# Patient Record
Sex: Female | Born: 1980 | Race: White | Hispanic: No | Marital: Single | Smoking: Former smoker
Health system: Southern US, Community
[De-identification: ages and names within clinical notes are randomized; demographics above are authoritative.]

## PROBLEM LIST (undated history)

## (undated) ENCOUNTER — Inpatient Hospital Stay (HOSPITAL_COMMUNITY): Payer: Self-pay

## (undated) DIAGNOSIS — J410 Simple chronic bronchitis: Secondary | ICD-10-CM

## (undated) DIAGNOSIS — F319 Bipolar disorder, unspecified: Secondary | ICD-10-CM

## (undated) DIAGNOSIS — J309 Allergic rhinitis, unspecified: Secondary | ICD-10-CM

## (undated) DIAGNOSIS — M546 Pain in thoracic spine: Secondary | ICD-10-CM

## (undated) DIAGNOSIS — Z9889 Other specified postprocedural states: Secondary | ICD-10-CM

## (undated) DIAGNOSIS — Z8632 Personal history of gestational diabetes: Secondary | ICD-10-CM

## (undated) DIAGNOSIS — G8929 Other chronic pain: Secondary | ICD-10-CM

## (undated) DIAGNOSIS — R112 Nausea with vomiting, unspecified: Secondary | ICD-10-CM

## (undated) DIAGNOSIS — E785 Hyperlipidemia, unspecified: Secondary | ICD-10-CM

## (undated) DIAGNOSIS — G43909 Migraine, unspecified, not intractable, without status migrainosus: Secondary | ICD-10-CM

## (undated) DIAGNOSIS — Z973 Presence of spectacles and contact lenses: Secondary | ICD-10-CM

## (undated) DIAGNOSIS — N920 Excessive and frequent menstruation with regular cycle: Secondary | ICD-10-CM

## (undated) DIAGNOSIS — F329 Major depressive disorder, single episode, unspecified: Secondary | ICD-10-CM

## (undated) DIAGNOSIS — F419 Anxiety disorder, unspecified: Secondary | ICD-10-CM

## (undated) DIAGNOSIS — M1990 Unspecified osteoarthritis, unspecified site: Secondary | ICD-10-CM

## (undated) DIAGNOSIS — K219 Gastro-esophageal reflux disease without esophagitis: Secondary | ICD-10-CM

## (undated) HISTORY — PX: TUBAL LIGATION: SHX77

## (undated) HISTORY — PX: GASTRIC BYPASS: SHX52

---
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
## 1998-02-02 ENCOUNTER — Ambulatory Visit (HOSPITAL_BASED_OUTPATIENT_CLINIC_OR_DEPARTMENT_OTHER): Admission: RE | Admit: 1998-02-02 | Discharge: 1998-02-02 | Payer: Self-pay | Admitting: Otolaryngology

## 1998-02-26 ENCOUNTER — Other Ambulatory Visit: Admission: RE | Admit: 1998-02-26 | Discharge: 1998-02-26 | Payer: Self-pay | Admitting: Obstetrics and Gynecology

## 1998-03-02 ENCOUNTER — Encounter: Payer: Self-pay | Admitting: Emergency Medicine

## 1998-03-02 ENCOUNTER — Emergency Department (HOSPITAL_COMMUNITY): Admission: EM | Admit: 1998-03-02 | Discharge: 1998-03-02 | Payer: Self-pay | Admitting: Emergency Medicine

## 1998-09-03 ENCOUNTER — Emergency Department (HOSPITAL_COMMUNITY): Admission: EM | Admit: 1998-09-03 | Discharge: 1998-09-03 | Payer: Self-pay | Admitting: Emergency Medicine

## 1998-11-24 ENCOUNTER — Emergency Department (HOSPITAL_COMMUNITY): Admission: EM | Admit: 1998-11-24 | Discharge: 1998-11-25 | Payer: Self-pay | Admitting: Emergency Medicine

## 1998-11-26 ENCOUNTER — Emergency Department (HOSPITAL_COMMUNITY): Admission: EM | Admit: 1998-11-26 | Discharge: 1998-11-26 | Payer: Self-pay | Admitting: Internal Medicine

## 1998-12-06 ENCOUNTER — Encounter: Payer: Self-pay | Admitting: Emergency Medicine

## 1998-12-06 ENCOUNTER — Emergency Department (HOSPITAL_COMMUNITY): Admission: EM | Admit: 1998-12-06 | Discharge: 1998-12-06 | Payer: Self-pay | Admitting: Emergency Medicine

## 1999-01-10 ENCOUNTER — Emergency Department (HOSPITAL_COMMUNITY): Admission: EM | Admit: 1999-01-10 | Discharge: 1999-01-10 | Payer: Self-pay | Admitting: Emergency Medicine

## 1999-01-14 HISTORY — PX: TONSILLECTOMY: SUR1361

## 1999-04-02 ENCOUNTER — Emergency Department (HOSPITAL_COMMUNITY): Admission: EM | Admit: 1999-04-02 | Discharge: 1999-04-02 | Payer: Self-pay | Admitting: Emergency Medicine

## 1999-09-11 ENCOUNTER — Emergency Department (HOSPITAL_COMMUNITY): Admission: EM | Admit: 1999-09-11 | Discharge: 1999-09-11 | Payer: Self-pay | Admitting: Emergency Medicine

## 1999-10-02 ENCOUNTER — Emergency Department (HOSPITAL_COMMUNITY): Admission: EM | Admit: 1999-10-02 | Discharge: 1999-10-03 | Payer: Self-pay

## 1999-10-13 ENCOUNTER — Ambulatory Visit (HOSPITAL_COMMUNITY): Admission: RE | Admit: 1999-10-13 | Discharge: 1999-10-13 | Payer: Self-pay | Admitting: Specialist

## 1999-10-13 ENCOUNTER — Encounter: Payer: Self-pay | Admitting: Specialist

## 1999-11-10 ENCOUNTER — Emergency Department (HOSPITAL_COMMUNITY): Admission: EM | Admit: 1999-11-10 | Discharge: 1999-11-10 | Payer: Self-pay | Admitting: Emergency Medicine

## 1999-12-25 ENCOUNTER — Ambulatory Visit (HOSPITAL_COMMUNITY): Admission: RE | Admit: 1999-12-25 | Discharge: 1999-12-25 | Payer: Self-pay | Admitting: Neurology

## 1999-12-27 ENCOUNTER — Ambulatory Visit (HOSPITAL_COMMUNITY): Admission: RE | Admit: 1999-12-27 | Discharge: 1999-12-27 | Payer: Self-pay | Admitting: Neurology

## 1999-12-27 ENCOUNTER — Encounter: Payer: Self-pay | Admitting: Neurology

## 2001-03-12 ENCOUNTER — Encounter: Payer: Self-pay | Admitting: Emergency Medicine

## 2001-03-12 ENCOUNTER — Emergency Department (HOSPITAL_COMMUNITY): Admission: EM | Admit: 2001-03-12 | Discharge: 2001-03-12 | Payer: Self-pay | Admitting: Emergency Medicine

## 2001-05-11 ENCOUNTER — Emergency Department (HOSPITAL_COMMUNITY): Admission: EM | Admit: 2001-05-11 | Discharge: 2001-05-11 | Payer: Self-pay | Admitting: Emergency Medicine

## 2001-05-11 ENCOUNTER — Encounter: Payer: Self-pay | Admitting: Emergency Medicine

## 2001-08-03 ENCOUNTER — Emergency Department (HOSPITAL_COMMUNITY): Admission: EM | Admit: 2001-08-03 | Discharge: 2001-08-03 | Payer: Self-pay | Admitting: Emergency Medicine

## 2002-02-02 ENCOUNTER — Emergency Department (HOSPITAL_COMMUNITY): Admission: EM | Admit: 2002-02-02 | Discharge: 2002-02-02 | Payer: Self-pay | Admitting: Emergency Medicine

## 2002-03-07 ENCOUNTER — Other Ambulatory Visit: Admission: RE | Admit: 2002-03-07 | Discharge: 2002-03-07 | Payer: Self-pay | Admitting: Internal Medicine

## 2004-04-16 ENCOUNTER — Ambulatory Visit: Payer: Self-pay | Admitting: *Deleted

## 2004-04-23 ENCOUNTER — Ambulatory Visit: Payer: Self-pay | Admitting: *Deleted

## 2004-04-26 ENCOUNTER — Inpatient Hospital Stay (HOSPITAL_COMMUNITY): Admission: AD | Admit: 2004-04-26 | Discharge: 2004-04-26 | Payer: Self-pay | Admitting: Obstetrics & Gynecology

## 2004-04-30 ENCOUNTER — Ambulatory Visit: Payer: Self-pay | Admitting: *Deleted

## 2004-05-06 ENCOUNTER — Inpatient Hospital Stay (HOSPITAL_COMMUNITY): Admission: AD | Admit: 2004-05-06 | Discharge: 2004-05-06 | Payer: Self-pay | Admitting: Obstetrics & Gynecology

## 2004-05-07 ENCOUNTER — Ambulatory Visit: Payer: Self-pay | Admitting: *Deleted

## 2004-05-10 ENCOUNTER — Inpatient Hospital Stay (HOSPITAL_COMMUNITY): Admission: AD | Admit: 2004-05-10 | Discharge: 2004-05-10 | Payer: Self-pay | Admitting: *Deleted

## 2004-05-14 ENCOUNTER — Ambulatory Visit: Payer: Self-pay | Admitting: *Deleted

## 2004-05-17 ENCOUNTER — Ambulatory Visit: Payer: Self-pay | Admitting: *Deleted

## 2004-05-21 ENCOUNTER — Inpatient Hospital Stay (HOSPITAL_COMMUNITY): Admission: AD | Admit: 2004-05-21 | Discharge: 2004-05-26 | Payer: Self-pay | Admitting: Obstetrics and Gynecology

## 2004-05-21 ENCOUNTER — Ambulatory Visit: Payer: Self-pay | Admitting: Obstetrics & Gynecology

## 2004-05-21 ENCOUNTER — Ambulatory Visit: Payer: Self-pay | Admitting: *Deleted

## 2004-05-23 ENCOUNTER — Encounter (INDEPENDENT_AMBULATORY_CARE_PROVIDER_SITE_OTHER): Payer: Self-pay | Admitting: *Deleted

## 2004-05-29 ENCOUNTER — Inpatient Hospital Stay (HOSPITAL_COMMUNITY): Admission: AD | Admit: 2004-05-29 | Discharge: 2004-05-29 | Payer: Self-pay | Admitting: Obstetrics and Gynecology

## 2004-06-04 ENCOUNTER — Inpatient Hospital Stay (HOSPITAL_COMMUNITY): Admission: AD | Admit: 2004-06-04 | Discharge: 2004-06-04 | Payer: Self-pay | Admitting: *Deleted

## 2004-10-10 ENCOUNTER — Emergency Department (HOSPITAL_COMMUNITY): Admission: EM | Admit: 2004-10-10 | Discharge: 2004-10-10 | Payer: Self-pay | Admitting: Emergency Medicine

## 2004-10-23 ENCOUNTER — Inpatient Hospital Stay (HOSPITAL_COMMUNITY): Admission: AD | Admit: 2004-10-23 | Discharge: 2004-10-23 | Payer: Self-pay | Admitting: Obstetrics and Gynecology

## 2004-10-31 ENCOUNTER — Ambulatory Visit: Payer: Self-pay | Admitting: Family Medicine

## 2004-10-31 ENCOUNTER — Encounter: Payer: Self-pay | Admitting: Family Medicine

## 2004-12-18 ENCOUNTER — Emergency Department (HOSPITAL_COMMUNITY): Admission: EM | Admit: 2004-12-18 | Discharge: 2004-12-18 | Payer: Self-pay | Admitting: Emergency Medicine

## 2004-12-21 ENCOUNTER — Emergency Department (HOSPITAL_COMMUNITY): Admission: EM | Admit: 2004-12-21 | Discharge: 2004-12-21 | Payer: Self-pay | Admitting: Emergency Medicine

## 2005-05-14 ENCOUNTER — Emergency Department (HOSPITAL_COMMUNITY): Admission: EM | Admit: 2005-05-14 | Discharge: 2005-05-14 | Payer: Self-pay | Admitting: Emergency Medicine

## 2005-09-11 ENCOUNTER — Emergency Department (HOSPITAL_COMMUNITY): Admission: EM | Admit: 2005-09-11 | Discharge: 2005-09-11 | Payer: Self-pay | Admitting: Emergency Medicine

## 2006-09-12 ENCOUNTER — Emergency Department (HOSPITAL_COMMUNITY): Admission: EM | Admit: 2006-09-12 | Discharge: 2006-09-12 | Payer: Self-pay | Admitting: Emergency Medicine

## 2006-10-10 ENCOUNTER — Emergency Department (HOSPITAL_COMMUNITY): Admission: EM | Admit: 2006-10-10 | Discharge: 2006-10-10 | Payer: Self-pay | Admitting: Emergency Medicine

## 2007-01-08 ENCOUNTER — Encounter: Admission: RE | Admit: 2007-01-08 | Discharge: 2007-01-08 | Payer: Self-pay | Admitting: Specialist

## 2007-02-05 ENCOUNTER — Emergency Department (HOSPITAL_COMMUNITY): Admission: EM | Admit: 2007-02-05 | Discharge: 2007-02-05 | Payer: Self-pay | Admitting: Family Medicine

## 2007-02-17 ENCOUNTER — Encounter: Admission: RE | Admit: 2007-02-17 | Discharge: 2007-02-17 | Payer: Self-pay | Admitting: Specialist

## 2007-03-25 ENCOUNTER — Emergency Department (HOSPITAL_COMMUNITY): Admission: EM | Admit: 2007-03-25 | Discharge: 2007-03-25 | Payer: Self-pay | Admitting: Emergency Medicine

## 2007-08-03 ENCOUNTER — Emergency Department (HOSPITAL_COMMUNITY): Admission: EM | Admit: 2007-08-03 | Discharge: 2007-08-03 | Payer: Self-pay | Admitting: Family Medicine

## 2007-09-24 ENCOUNTER — Emergency Department (HOSPITAL_COMMUNITY): Admission: EM | Admit: 2007-09-24 | Discharge: 2007-09-24 | Payer: Self-pay | Admitting: Emergency Medicine

## 2007-10-14 LAB — CONVERTED CEMR LAB: Pap Smear: NORMAL

## 2007-11-17 ENCOUNTER — Ambulatory Visit (HOSPITAL_COMMUNITY): Admission: RE | Admit: 2007-11-17 | Discharge: 2007-11-18 | Payer: Self-pay | Admitting: Specialist

## 2007-11-17 HISTORY — PX: LUMBAR MICRODISCECTOMY: SHX99

## 2008-01-23 ENCOUNTER — Emergency Department (HOSPITAL_COMMUNITY): Admission: EM | Admit: 2008-01-23 | Discharge: 2008-01-23 | Payer: Self-pay | Admitting: Emergency Medicine

## 2008-10-13 ENCOUNTER — Emergency Department (HOSPITAL_COMMUNITY): Admission: EM | Admit: 2008-10-13 | Discharge: 2008-10-13 | Payer: Self-pay | Admitting: Emergency Medicine

## 2009-02-01 ENCOUNTER — Ambulatory Visit: Payer: Self-pay | Admitting: Internal Medicine

## 2009-02-01 DIAGNOSIS — G43009 Migraine without aura, not intractable, without status migrainosus: Secondary | ICD-10-CM | POA: Insufficient documentation

## 2009-02-01 DIAGNOSIS — F329 Major depressive disorder, single episode, unspecified: Secondary | ICD-10-CM

## 2009-02-01 DIAGNOSIS — K219 Gastro-esophageal reflux disease without esophagitis: Secondary | ICD-10-CM

## 2009-02-01 DIAGNOSIS — F411 Generalized anxiety disorder: Secondary | ICD-10-CM | POA: Insufficient documentation

## 2009-02-01 DIAGNOSIS — M549 Dorsalgia, unspecified: Secondary | ICD-10-CM | POA: Insufficient documentation

## 2009-02-01 DIAGNOSIS — K8020 Calculus of gallbladder without cholecystitis without obstruction: Secondary | ICD-10-CM | POA: Insufficient documentation

## 2009-02-01 LAB — CONVERTED CEMR LAB
ALT: 16 units/L (ref 0–35)
AST: 18 units/L (ref 0–37)
Alkaline Phosphatase: 58 units/L (ref 39–117)
BUN: 9 mg/dL (ref 6–23)
Basophils Absolute: 0.1 10*3/uL (ref 0.0–0.1)
Bilirubin Urine: NEGATIVE
CO2: 30 meq/L (ref 19–32)
Chloride: 104 meq/L (ref 96–112)
Cholesterol: 200 mg/dL (ref 0–200)
Eosinophils Relative: 1.5 % (ref 0.0–5.0)
Glucose, Bld: 97 mg/dL (ref 70–99)
HCT: 42.9 % (ref 36.0–46.0)
Ketones, ur: NEGATIVE mg/dL
Lymphs Abs: 2 10*3/uL (ref 0.7–4.0)
MCHC: 32 g/dL (ref 30.0–36.0)
Monocytes Absolute: 0.5 10*3/uL (ref 0.1–1.0)
Monocytes Relative: 6.5 % (ref 3.0–12.0)
Nitrite: NEGATIVE
Platelets: 248 10*3/uL (ref 150.0–400.0)
Sodium: 140 meq/L (ref 135–145)
Specific Gravity, Urine: 1.015 (ref 1.000–1.030)
TSH: 0.7 microintl units/mL (ref 0.35–5.50)
Total Protein, Urine: NEGATIVE mg/dL
Triglycerides: 215 mg/dL — ABNORMAL HIGH (ref 0.0–149.0)
WBC: 7.2 10*3/uL (ref 4.5–10.5)
pH: 6.5 (ref 5.0–8.0)

## 2009-04-26 ENCOUNTER — Ambulatory Visit: Payer: Self-pay | Admitting: Internal Medicine

## 2009-04-26 DIAGNOSIS — F988 Other specified behavioral and emotional disorders with onset usually occurring in childhood and adolescence: Secondary | ICD-10-CM | POA: Insufficient documentation

## 2009-04-26 DIAGNOSIS — E785 Hyperlipidemia, unspecified: Secondary | ICD-10-CM

## 2009-04-26 DIAGNOSIS — L290 Pruritus ani: Secondary | ICD-10-CM | POA: Insufficient documentation

## 2009-04-26 HISTORY — DX: Pruritus ani: L29.0

## 2009-04-30 ENCOUNTER — Telehealth: Payer: Self-pay | Admitting: Internal Medicine

## 2009-05-03 ENCOUNTER — Encounter: Payer: Self-pay | Admitting: Internal Medicine

## 2009-06-07 ENCOUNTER — Telehealth: Payer: Self-pay | Admitting: Internal Medicine

## 2009-07-04 ENCOUNTER — Ambulatory Visit: Payer: Self-pay | Admitting: Internal Medicine

## 2009-07-04 ENCOUNTER — Encounter (INDEPENDENT_AMBULATORY_CARE_PROVIDER_SITE_OTHER): Payer: Self-pay | Admitting: *Deleted

## 2009-07-10 ENCOUNTER — Telehealth: Payer: Self-pay | Admitting: Internal Medicine

## 2009-07-10 ENCOUNTER — Encounter (INDEPENDENT_AMBULATORY_CARE_PROVIDER_SITE_OTHER): Payer: Self-pay | Admitting: *Deleted

## 2009-07-12 ENCOUNTER — Telehealth: Payer: Self-pay | Admitting: Internal Medicine

## 2009-07-30 ENCOUNTER — Telehealth: Payer: Self-pay | Admitting: Internal Medicine

## 2009-08-09 ENCOUNTER — Telehealth: Payer: Self-pay | Admitting: Internal Medicine

## 2009-09-17 ENCOUNTER — Emergency Department (HOSPITAL_COMMUNITY): Admission: EM | Admit: 2009-09-17 | Discharge: 2009-09-17 | Payer: Self-pay | Admitting: Emergency Medicine

## 2009-09-19 ENCOUNTER — Telehealth: Payer: Self-pay | Admitting: Internal Medicine

## 2009-09-20 ENCOUNTER — Ambulatory Visit: Payer: Self-pay | Admitting: Internal Medicine

## 2009-09-20 DIAGNOSIS — IMO0002 Reserved for concepts with insufficient information to code with codable children: Secondary | ICD-10-CM | POA: Insufficient documentation

## 2009-10-23 ENCOUNTER — Telehealth: Payer: Self-pay | Admitting: Internal Medicine

## 2009-10-31 ENCOUNTER — Encounter: Payer: Self-pay | Admitting: Internal Medicine

## 2009-11-30 ENCOUNTER — Telehealth: Payer: Self-pay | Admitting: Internal Medicine

## 2009-12-17 ENCOUNTER — Telehealth: Payer: Self-pay | Admitting: Internal Medicine

## 2010-01-11 ENCOUNTER — Telehealth: Payer: Self-pay | Admitting: Internal Medicine

## 2010-02-08 ENCOUNTER — Other Ambulatory Visit: Payer: Self-pay | Admitting: Internal Medicine

## 2010-02-08 ENCOUNTER — Ambulatory Visit
Admission: RE | Admit: 2010-02-08 | Discharge: 2010-02-08 | Payer: Self-pay | Source: Home / Self Care | Attending: Internal Medicine | Admitting: Internal Medicine

## 2010-02-08 DIAGNOSIS — G4710 Hypersomnia, unspecified: Secondary | ICD-10-CM | POA: Insufficient documentation

## 2010-02-08 LAB — BASIC METABOLIC PANEL
Calcium: 9.2 mg/dL (ref 8.4–10.5)
Chloride: 106 mEq/L (ref 96–112)
Creatinine, Ser: 0.6 mg/dL (ref 0.4–1.2)
GFR: 114.69 mL/min (ref >60.00)

## 2010-02-08 LAB — URINALYSIS, ROUTINE W REFLEX MICROSCOPIC
Specific Gravity, Urine: <1.005 (ref 1.000–1.030)
Total Protein, Urine: NEGATIVE
Urine Glucose: NEGATIVE

## 2010-02-08 LAB — URINALYSIS

## 2010-02-08 LAB — LIPID PANEL
HDL: 46.8 mg/dL (ref >39.00)
Total CHOL/HDL Ratio: 5
VLDL: 43.6 mg/dL — ABNORMAL HIGH (ref 0.0–40.0)

## 2010-02-08 LAB — CBC WITH DIFFERENTIAL/PLATELET
Basophils Absolute: 0.1 10*3/uL (ref 0.0–0.1)
Eosinophils Absolute: 0.1 10*3/uL (ref 0.0–0.7)
Eosinophils Relative: 0.9 % (ref 0.0–5.0)
Lymphocytes Relative: 22.8 % (ref 12.0–46.0)
Lymphs Abs: 2.8 10*3/uL (ref 0.7–4.0)
MCHC: 33.2 g/dL (ref 30.0–36.0)
MCV: 81.2 fl (ref 78.0–100.0)
Neutrophils Relative %: 70 % (ref 43.0–77.0)
RBC: 4.89 Mil/uL (ref 3.87–5.11)

## 2010-02-08 LAB — LDL CHOLESTEROL, DIRECT: Direct LDL: 149.6 mg/dL

## 2010-02-08 LAB — HEPATIC FUNCTION PANEL
Alkaline Phosphatase: 50 U/L (ref 39–117)
Bilirubin, Direct: 0 mg/dL (ref 0.0–0.3)
Total Bilirubin: 0.3 mg/dL (ref 0.3–1.2)

## 2010-02-08 LAB — TSH: TSH: 0.96 u[IU]/mL (ref 0.35–5.50)

## 2010-02-12 NOTE — Progress Notes (Signed)
Summary: Adderall  Phone Note Call from Patient Call back at Home Phone [phone]   Caller: Patient Summary of Call: Pt called requesting refill of Adderall. Initial call taken by: Margaret Pyle, CMA,  October 23, 2009 10:26 AM  Follow-up for Phone Call        done hardcopy to LIM side B - dahlia   Follow-up by: Corwin Levins MD,  October 23, 2009 1:13 PM  Additional Follow-up for Phone Call Additional follow up Details #1::        Pt informed, Rx in cabinet for pt pick up Additional Follow-up by: Margaret Pyle, CMA,  October 23, 2009 1:38 PM    New/Updated Medications: AMPHETAMINE-DEXTROAMPHETAMINE 20 MG TABS (AMPHETAMINE-DEXTROAMPHETAMINE) 1 by mouth once daily  - to fill Oct 23, 2009 Prescriptions: AMPHETAMINE-DEXTROAMPHETAMINE 20 MG TABS (AMPHETAMINE-DEXTROAMPHETAMINE) 1 by mouth once daily  - to fill Oct 23, 2009  #30 x 0   Entered and Authorized by:   Corwin Levins MD   Signed by:   Corwin Levins MD on 10/23/2009   Method used:   Print then Give to Patient   RxID:   [id number]

## 2010-02-12 NOTE — Progress Notes (Signed)
Summary: Adderall  Phone Note Call from Patient Call back at Home Phone [phone]   Caller: Patient Summary of Call: pt called requesting Rx refill of Adderall. Pt states that Adderall XR 20mg  is not helping and she would like to try Adderall 20mg  Initial call taken by: Margaret Pyle, CMA,  Jun 07, 2009 1:35 PM  Follow-up for Phone Call        ok to change - done hardcopy to LIM side B - dahlia  Follow-up by: Corwin Levins MD,  Jun 07, 2009 5:19 PM  Additional Follow-up for Phone Call Additional follow up Details #1::        pt informed via VM, Rx in cabinet for pt pick up Additional Follow-up by: Margaret Pyle, CMA,  Jun 08, 2009 8:13 AM    New/Updated Medications: AMPHETAMINE-DEXTROAMPHETAMINE 20 MG TABS (AMPHETAMINE-DEXTROAMPHETAMINE) 1 by mouth once daily  - to fill Jun 06, 2009 Prescriptions: AMPHETAMINE-DEXTROAMPHETAMINE 20 MG TABS (AMPHETAMINE-DEXTROAMPHETAMINE) 1 by mouth once daily  - to fill Jun 06, 2009  #30 x 0   Entered and Authorized by:   Corwin Levins MD   Signed by:   Corwin Levins MD on 06/07/2009   Method used:   Print then Give to Patient   RxID:   [id number]  done hardcopy to LIM side B - dahlia  Corwin Levins MD  Jun 07, 2009 5:20 PM

## 2010-02-12 NOTE — Assessment & Plan Note (Signed)
Summary: DISCUSS MEDICATION/ PROBLEMS/NWS   Vital Signs:  Patient profile:   30 year old female Height:      67.5 inches Weight:      266 pounds BMI:     41.19 O2 Sat:      98 % on Room air Temp:     97 degrees F oral Pulse rate:   96 / minute BP sitting:   100 / 68  (left arm) Cuff size:   large  Vitals Entered ByZella Ball Ewing (April 26, 2009 3:28 PM)  O2 Flow:  Room air CC: rectal area itching for one month, problems focusing needs prescription/RE   CC:  rectal area itching for one month and problems focusing needs prescription/RE.  History of Present Illness: here with marked itch and mild erythema to perianal area and natal cleft, with mild discomfort and no bleeding or hemorrhoid or fever for 4 wks  also c/o hx of ADD in High Sschool, tx with adderal adn did well at the time, now at [facility] And very difficult to complete her coursework due to lack of focus and concentration, reqeusts re-start of Adderall, tolerated well in the past   lost 10 lbs with better excericse and diet since jan 2011; has been trying to follow lower chol diet consistently as well  Problems Prior to Update: 1)  Add  (ICD-314.00) 2)  Pruritus Ani  (ICD-698.0) 3)  Hyperlipidemia  (ICD-272.4) 4)  Preventive Health Care  (ICD-V70.0) 5)  Gerd  (ICD-530.81) 6)  Cholelithiasis  (ICD-574.20) 7)  Anxiety  (ICD-300.00) 8)  Common Migraine  (ICD-346.10) 9)  Back Pain, Chronic  (ICD-724.5) 10)  Depression  (ICD-311)  Medications Prior to Update: 1)  Omeprazole 20 Mg Cpdr (Omeprazole) .Marland Kitchen.. 1 By Mouth Once Daily 2)  Sumatriptan Succinate 100 Mg Tabs (Sumatriptan Succinate) .Marland Kitchen.. 1 By Mouth Once Daily As Needed 3)  Fluoxetine Hcl 20 Mg Caps (Fluoxetine Hcl) .Marland Kitchen.. 1 By Mouth Once Daily 4)  Alprazolam 0.5 Mg Tabs (Alprazolam) .Marland Kitchen.. 1po Two Times A Day As Needed  Current Medications (verified): 1)  Omeprazole 20 Mg Cpdr (Omeprazole) .Marland Kitchen.. 1 By Mouth Once Daily 2)  Sumatriptan Succinate 100 Mg Tabs (Sumatriptan  Succinate) .Marland Kitchen.. 1 By Mouth Once Daily As Needed 3)  Fluoxetine Hcl 20 Mg Caps (Fluoxetine Hcl) .Marland Kitchen.. 1 By Mouth Once Daily 4)  Alprazolam 0.5 Mg Tabs (Alprazolam) .Marland Kitchen.. 1po Two Times A Day As Needed 5)  Proctofoam Hc 1-1 % Foam (Hydrocortisone Ace-Pramoxine) .... Use Asd Two Times A Day As Needed 6)  Adderall Xr 20 Mg Xr24h-Cap (Amphetamine-Dextroamphetamine) .Marland Kitchen.. 1 By Mouth Once Daily  Allergies (verified): No Known Drug Allergies  Past History:  Social History: Last updated: 04/26/2009 Single 1 child., also raises 15yo nephew Current Smoker Alcohol use-no Drug use-no work  - Environmental manager part time student - [employer]  Risk Factors: Smoking Status: current (02/01/2009)  Past Medical History: Depression obesity chronic LBP - dr Ethelene Hal and dr beane/[place] [facility] migraine Anxiety known gallstones GERD Hyperlipidemia ADD  Past Surgical History: Reviewed history from 02/01/2009 and no changes required. 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   Head:  normocephalic and atraumatic.  Eyes:  vision grossly intact, pupils equal, and pupils round.   Ears:  R ear normal and L ear normal.   Nose:  no external deformity and no nasal discharge.   Mouth:  no gingival abnormalities and pharynx pink and moist.   Neck:  supple and no masses.   Lungs:  normal respiratory effort and normal breath sounds.   Heart:  normal rate and regular rhythm.   Rectal:  deferred Extremities:  no edema, no erythema    Impression & Recommendations:  Problem # 1:  PRURITUS ANI (ICD-698.0) tx with topical steroid as needed   Problem # 2:  HYPERLIPIDEMIA (ICD-272.4)  d/w pt  - to  cont to follow lower chol diet , declines further lab today  Labs Reviewed: SGOT: 18 (02/01/2009)   SGPT: 16 (02/01/2009)   HDL:36.70 (02/01/2009)  Chol:200 (02/01/2009)  Trig:215.0 (02/01/2009)  Problem # 3:  ADD (ICD-314.00) to re-start the adderal xr 20 mg daily  Complete Medication List: 1)  Omeprazole 20 Mg Cpdr (Omeprazole) .Marland Kitchen.. 1 by mouth once daily 2)  Sumatriptan Succinate 100 Mg Tabs (Sumatriptan succinate) .Marland Kitchen.. 1 by mouth once daily as needed 3)  Fluoxetine Hcl 20 Mg Caps (Fluoxetine hcl) .Marland Kitchen.. 1 by mouth once daily 4)  Alprazolam 0.5 Mg Tabs (Alprazolam) .Marland Kitchen.. 1po two times a day as needed 5)  Proctofoam Hc 1-1 % Foam (Hydrocortisone ace-pramoxine) .... Use asd two times a day as needed 6)  Adderall Xr 20 Mg Xr24h-cap (Amphetamine-dextroamphetamine) .Marland Kitchen.. 1 by mouth once daily  Patient Instructions: 1)  Please take all new medications as prescribed 2)  Continue all previous medications as before this visit  3)  Please schedule a follow-up appointment in Jan 2012 with CPX labs  Prescriptions: PROCTOFOAM HC 1-1 % FOAM (HYDROCORTISONE ACE-PRAMOXINE) use asd two times a day as needed  #1 x 1   Entered and Authorized by:   Corwin Levins MD   Signed by:   Corwin Levins MD on 04/26/2009   Method used:   Print then Give to Patient   RxID:   [id number] ADDERALL XR 20 MG XR24H-CAP (AMPHETAMINE-DEXTROAMPHETAMINE) 1 by mouth once daily  #30 x 0   Entered and Authorized by:   Corwin Levins MD   Signed by:   Corwin Levins MD on 04/26/2009   Method used:   Print then Give to Patient   RxID:   [id number] PROCTOFOAM HC 1-1 % FOAM (HYDROCORTISONE ACE-PRAMOXINE) use asd two times a day as needed  #1 x 1   Entered and Authorized by:   Corwin Levins MD   Signed by:   Corwin Levins MD on 04/26/2009   Method used:   Electronically to        [facility] (retail)       [address]       [place], [place]  [address]       Ph: [phone]       Fax:  [phone]   RxID:   [id number]

## 2010-02-12 NOTE — Letter (Signed)
Summary: Primary Care Appointment Letter  [facility]  [address] [place], [place] [address]   Phone: [phone]  Fax: [phone]    10/31/2009 MRN: [id number]  Tina [name] [address] [place], [place]  [address]  Dear Ms. [facility],   Your Primary Care Physician Corwin Levins MD has indicated that:    ___X____it is time to schedule an appointment with Dr. Jonny Ruiz in March 2012.  Fasting Labs are in the computer to be done prior to the appt.  Please call the office.    _______you missed your appointment on______ and need to call and          reschedule.    _______you need to have lab work done.    _______you need to schedule an appointment discuss lab or test results.    _______you need to call to reschedule your appointment that is                       scheduled on _________.     Please call our office as soon as possible. Our phone number is [phone]. Please press option 1. Our office is open 8a-12noon and 1p-5p, Monday through Friday.     Thank you,    [facility]
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
## 2010-02-12 NOTE — Medication Information (Signed)
Summary: Approved/medco  Approved/medco   Imported By: Lester Versailles 05/11/2009 10:35:13  _____________________________________________________________________  External Attachment:    Type:   Image     Comment:   External Document

## 2010-02-12 NOTE — Letter (Signed)
Summary: Out of Work  [facility]  [address] [place], [place] [address]   Phone: [phone]  Fax: [phone]    July 04, 2009   Employee:  Tina [name] [employer]    To Whom It May Concern:   For Medical reasons, please excuse the above named employee from work for the following dates:  Start:   07/02/2009  End:   07/09/2009  Return to work July 09, 2009  If you need additional information, please feel free to contact our office.         Sincerely,    Dr. Oliver Barre

## 2010-02-12 NOTE — Letter (Signed)
Summary: Generic Letter  [facility]  [address] [place], [place] [address]   Phone: [phone]  Fax: [phone]    07/10/2009   Tina [name] [address] [place], [place]  [address]   [name],    To whom it may concern:  This letter to serve as clearance for the above-named patient to return to work effective June 28th, 2011 without any restriction.          Sincerely,    Margaret Pyle, CMA

## 2010-02-12 NOTE — Progress Notes (Signed)
Summary: Adderall  Phone Note Call from Patient Call back at Home Phone [phone]   Caller: Patient Summary of Call: Pt called requesting refill of Adderall Initial call taken by: Margaret Pyle, CMA,  September 19, 2009 10:03 AM  Follow-up for Phone Call        done hardcopy to LIM side B - dahlia  Follow-up by: Corwin Levins MD,  September 19, 2009 12:42 PM  Additional Follow-up for Phone Call Additional follow up Details #1::        Pt informed via VM, Rx in cabinet for pt pick up Additional Follow-up by: Margaret Pyle, CMA,  September 19, 2009 1:37 PM    New/Updated Medications: AMPHETAMINE-DEXTROAMPHETAMINE 20 MG TABS (AMPHETAMINE-DEXTROAMPHETAMINE) 1 by mouth once daily  - to fill sept 7, 2011 Prescriptions: AMPHETAMINE-DEXTROAMPHETAMINE 20 MG TABS (AMPHETAMINE-DEXTROAMPHETAMINE) 1 by mouth once daily  - to fill sept 7, 2011  #30 x 0   Entered and Authorized by:   Corwin Levins MD   Signed by:   Corwin Levins MD on 09/19/2009   Method used:   Print then Give to Patient   RxID:   [id number]

## 2010-02-12 NOTE — Progress Notes (Signed)
Summary: Adderall  Phone Note Call from Patient Call back at Home Phone [phone]   Caller: Patient Summary of Call: Pt called requesting refill of Adderall, I called pt who advised she would rather try 30mg  XR first and if it is too strong and alternate medicine can be used.  Initial call taken by: Margaret Pyle, CMA,  November 30, 2009 10:26 AM  Follow-up for Phone Call        ok - will do - done hardcopy to LIM side B - dahlia  Follow-up by: Corwin Levins MD,  November 30, 2009 1:34 PM  Additional Follow-up for Phone Call Additional follow up Details #1::        Called pt no ansew LMOM rx ready for pick-up Additional Follow-up by: Orlan Leavens RMA,  November 30, 2009 1:44 PM    New/Updated Medications: ADDERALL XR 30 MG XR24H-CAP (AMPHETAMINE-DEXTROAMPHETAMINE) 1po once daily Prescriptions: ADDERALL XR 30 MG XR24H-CAP (AMPHETAMINE-DEXTROAMPHETAMINE) 1po once daily  #30 x 0   Entered and Authorized by:   Corwin Levins MD   Signed by:   Corwin Levins MD on 11/30/2009   Method used:   Print then Give to Patient   RxID:   [id number]

## 2010-02-12 NOTE — Assessment & Plan Note (Signed)
Summary: NEW BCBS PT--PKG/OFF-#--STC   Vital Signs:  Patient profile:   30 year old female Height:      68 inches Weight:      271 pounds BMI:     41.35 O2 Sat:      98 % on Room air Temp:     98.3 degrees F oral Pulse rate:   80 / minute BP sitting:   100 / 68  (left arm) Cuff size:   large  Vitals Entered ByMarland Kitchen Zella Ball Ewing (February 01, 2009 1:01 PM)  O2 Flow:  Room air  Preventive Care Screening  Pap Smear:    Date:  10/14/2007    Results:  normal   CC: New Pt. New BCBS/RE   CC:  New Pt. New BCBS/RE.  History of Present Illness: here to establish, c/p worsening deprsision in the past month, no suicidal ideation; marked stress,  no recent panic attacks but had several about 6 months ago;  had previous tx 14 yrs for depresion after sister died but does not remember med name - did seem to help at that time, tx for approx one year;  no prior hosns for psych;  no recent counseling and feels like it would help but not sure about the cost;  has  migraine approx 5 days per wk - so far not missed work but still works for [employer];  Pt denies CP, sob, doe, wheezing, orthopnea, pnd, worsening LE edema, palps, dizziness or syncope Pt denies new neuro symptoms such as headache, facial or extremity weakness  No recent fever or other complaints.  Wt has gone up though in the past yr about 25 lbs, but no hypersomnlence.    Preventive Screening-Counseling & Management  Alcohol-Tobacco     Smoking Status: current      Drug Use:  no.    Problems Prior to Update: 1)  Preventive Health Care  (ICD-V70.0) 2)  Gerd  (ICD-530.81) 3)  Cholelithiasis  (ICD-574.20) 4)  Anxiety  (ICD-300.00) 5)  Common Migraine  (ICD-346.10) 6)  Back Pain, Chronic  (ICD-724.5) 7)  Depression  (ICD-311)  Medications Prior to Update: 1)  None  Current Medications (verified): 1)  Omeprazole 20 Mg Cpdr (Omeprazole) .Marland Kitchen.. 1 By Mouth Once Daily 2)  Sumatriptan Succinate 100 Mg Tabs (Sumatriptan Succinate) .Marland Kitchen.. 1 By  Mouth Once Daily As Needed 3)  Fluoxetine Hcl 20 Mg Caps (Fluoxetine Hcl) .Marland Kitchen.. 1 By Mouth Once Daily 4)  Alprazolam 0.5 Mg Tabs (Alprazolam) .Marland Kitchen.. 1po Two Times A Day As Needed  Allergies (verified): No Known Drug Allergies  Past History:  Family History: Last updated: 02/01/2009 m-grandmother - CABG, DM, HTN, dialysis p-grandmother with breast cancer father with DM, HTN mother with depression cousin died with cancer uncle and grandfather with colon cancer uncle with renal failure/dialysis sister died with homicide 62  Social History: Last updated: 02/01/2009 Single 1 child., also raises 32yo nephew Current Smoker Alcohol use-no Drug use-no work  - [employer]  Risk Factors: Smoking Status: current (02/01/2009)  Past Medical History: Depression obesity chronic LBP - dr Ethelene Hal and dr beane/[place] [facility] migraine Anxiety known gallstones GERD  Past Surgical History: Caesarean section Tonsillectomy s/p disc surgury/spinal stenosis - lumbar - Nov 2009 - dr Roosevelt Locks  Family History: Reviewed history and no changes required. m-grandmother - CABG, DM, HTN, dialysis p-grandmother with breast cancer father with DM, HTN mother with depression cousin died with cancer uncle and grandfather with colon cancer uncle with renal failure/dialysis sister died with homicide 48  Social History: Reviewed history and no changes required. Single 1 child., also raises 32yo nephew Current Smoker Alcohol use-no Drug use-no work  - [employer] Status:  current Drug Use:  no  Review of Systems  The patient denies anorexia, fever, vision loss, decreased hearing, hoarseness, chest pain, syncope, peripheral edema, prolonged cough, headaches, hemoptysis, abdominal pain, melena, hematochezia, severe indigestion/heartburn, hematuria, incontinence, muscle weakness, suspicious skin lesions, transient blindness, difficulty walking, abnormal bleeding, enlarged lymph nodes, and  angioedema.         all otherwise negative per pt - 12 system review done  - except for rare sharp stabbing pains to abd in different places; has some acid reflux'; without dysphagia, or pain  Physical Exam  General:  alert and overweight-appearing.   Head:  normocephalic and atraumatic.   Eyes:  vision grossly intact, pupils equal, and pupils round.   Ears:  R ear normal and L ear normal.   Nose:  no external deformity and no nasal discharge.   Mouth:  no gingival abnormalities and pharynx pink and moist.   Neck:  supple and no masses.   Lungs:  normal respiratory effort and normal breath sounds.   Heart:  normal rate and regular rhythm.   Abdomen:  soft, non-tender, and normal bowel sounds.   Msk:  no joint tenderness and no joint swelling.   Extremities:  no edema, no erythema  Neurologic:  cranial nerves II-XII intact and strength normal in all extremities.   Skin:  no rashes and no edema.   Psych:  depressed affect and moderately anxious.  , tearful at times   Impression & Recommendations:  Problem # 1:  Preventive Health Care (ICD-V70.0)  Overall doing well, updated the age appropriate counseling and education;  routine health screening/prevention reviewed and done as appropriate unless declined, immunizations up to date or declined, diet counseling done if overweight, urged to quit smoking if smokes , most recent labs reviewed and current ordered if appropriate, ecg reviewed or declined; information regarding Medicare Prevention requirements given if appropriate   Orders: TLB-BMP (Basic Metabolic Panel-BMET) (80048-METABOL) TLB-CBC Platelet - w/Differential (85025-CBCD) TLB-Hepatic/Liver Function Pnl (80076-HEPATIC) TLB-Lipid Panel (80061-LIPID) TLB-TSH (Thyroid Stimulating Hormone) (84443-TSH) TLB-Udip ONLY (81003-UDIP)  Problem # 2:  DEPRESSION (ICD-311)  refer for counseling;  also for med tx as above  Orders: Psychology Referral (Psychology)  Her updated  medication list for this problem includes:    Fluoxetine Hcl 20 Mg Caps (Fluoxetine hcl) .Marland Kitchen... 1 by mouth once daily    Alprazolam 0.5 Mg Tabs (Alprazolam) .Marland Kitchen... 1po two times a day as needed  Problem # 3:  COMMON MIGRAINE (ICD-346.10)  ok for generic imitrex, declines [facility] for now due to cost  Her updated medication list for this problem includes:    Sumatriptan Succinate 100 Mg Tabs (Sumatriptan succinate) .Marland Kitchen... 1 by mouth once daily as needed  Problem # 4:  GERD (ICD-530.81)  Her updated medication list for this problem includes:    Omeprazole 20 Mg Cpdr (Omeprazole) .Marland Kitchen... 1 by mouth once daily treat as above, f/u any worsening signs or symptoms   Complete Medication List: 1)  Omeprazole 20 Mg Cpdr (Omeprazole) .Marland Kitchen.. 1 by mouth once daily 2)  Sumatriptan Succinate 100 Mg Tabs (Sumatriptan succinate) .Marland Kitchen.. 1 by mouth once daily as needed 3)  Fluoxetine Hcl 20 Mg Caps (Fluoxetine hcl) .Marland Kitchen.. 1 by mouth once daily 4)  Alprazolam 0.5 Mg Tabs (Alprazolam) .Marland Kitchen.. 1po two times a day as needed  Other Orders: Tdap =>  74yrs IM [phone]) Admin 1st Vaccine (60454)  Patient Instructions: 1)  you had the tetanus shot today 2)  please call for your yearly pap smear 3)  please call if you would like to be referred to [facility] 4)  Please take all new medications as prescribed  - generic imitrex 5)  Continue all previous medications as before this visit  6)  Please go to the Lab in the basement for your blood and/or urine tests today  7)  Please schedule a follow-up appointment in 1 year or sooner if needed Prescriptions: ALPRAZOLAM 0.5 MG TABS (ALPRAZOLAM) 1po two times a day as needed  #60 x 0   Entered and Authorized by:   Corwin Levins MD   Signed by:   Corwin Levins MD on 02/01/2009   Method used:   Print then Give to Patient   RxID:   [id number] FLUOXETINE HCL 20 MG CAPS (FLUOXETINE HCL) 1 by mouth once daily  #90 x 3   Entered and Authorized by:   Corwin Levins MD   Signed by:   Corwin Levins MD on 02/01/2009   Method used:   Electronically to        [facility] (retail)       [address]       [place], [place]  [address]       Ph: [phone]       Fax: [phone]   RxID:   [id number] SUMATRIPTAN SUCCINATE 100 MG TABS (SUMATRIPTAN SUCCINATE) 1 by mouth once daily as needed  #9 x 11   Entered and Authorized by:   Corwin Levins MD   Signed by:   Corwin Levins MD on 02/01/2009   Method used:   Electronically to        [facility] (retail)       [address]       [place], [place]  [address]       Ph: [phone]       Fax: [phone]   RxID:   [id number] OMEPRAZOLE 20 MG CPDR (OMEPRAZOLE) 1 by mouth once daily  #30 x 11   Entered and Authorized by:   Corwin Levins MD   Signed by:   Corwin Levins MD on 02/01/2009   Method used:   Electronically to        [facility] (retail)       [address]       [place], [place]  [address]       Ph: [phone]       Fax: [phone]   RxID:   [id number]    Immunizations Administered:  Tetanus Vaccine:    Vaccine Type: Tdap    Site: right deltoid    Mfr: GlaxoSmithKline    Dose: 0.5 ml    Route: IM    Given by: Robin Ewing    Exp. Date: 03/10/2011    Lot #: [id number]    VIS given: 12/01/06 version given February 01, 2009.

## 2010-02-12 NOTE — Assessment & Plan Note (Signed)
Summary: POST ER FU / REFERRAL /NWS   Vital Signs:  Patient profile:   30 year old female Height:      67 inches Weight:      259 pounds BMI:     40.71 O2 Sat:      94 % on Room air Temp:     98.6 degrees F oral Pulse rate:   100 / minute BP sitting:   120 / 80  (left arm) Cuff size:   large  Vitals Entered By: Zella Ball Ewing CMA Duncan Dull) (September 20, 2009 4:42 PM)  O2 Flow:  Room air CC: Post ER/RE   CC:  Post ER/RE.  History of Present Illness: here for f/u after being seen in local ER, per pt treated for right sciatica with oxycodone (which caused nausea adn GI upset so she has minimized as able) and muscle relaxer;  symptoms persist no change per pt; has primarily onset right buttock pain with near constant radiation to the distal RLE assoc with weakness in that she would be unable to bear full wt on the RLE alone she thinks and has some diffictuly getting into a car, as well as mild numbness;  has known hx of spinal stenosis, last MRI she thinks about Feb 2011 but we dont have access to the results today, and she prefers to not f/u with Dr Shelle Iron for this.  Curretnly pain approx 9-10/10;  limps to walk but no falls, injury, No fever, wt loss, night sweats, loss of appetite or other constitutional symptoms , and no bowel or bladder changes.  No obvious inciting event such as lifting or bending.  Cont;s to work at [employer] but no heavy lifitng.  Was seen last here as per EMR and did well with pred pack, hdyrocodone and flexeril.    Problems Prior to Update: 1)  Lumbar Radiculopathy, Right  (ICD-724.4) 2)  Back Pain, Acute  (ICD-724.5) 3)  Add  (ICD-314.00) 4)  Pruritus Ani  (ICD-698.0) 5)  Hyperlipidemia  (ICD-272.4) 6)  Preventive Health Care  (ICD-V70.0) 7)  Gerd  (ICD-530.81) 8)  Cholelithiasis  (ICD-574.20) 9)  Anxiety  (ICD-300.00) 10)  Common Migraine  (ICD-346.10) 11)  Back Pain, Chronic  (ICD-724.5) 12)  Depression  (ICD-311)  Medications Prior to Update: 1)  Omeprazole  20 Mg Cpdr (Omeprazole) .Marland Kitchen.. 1 By Mouth Once Daily 2)  Sumatriptan Succinate 100 Mg Tabs (Sumatriptan Succinate) .Marland Kitchen.. 1 By Mouth Once Daily As Needed 3)  Fluoxetine Hcl 20 Mg Caps (Fluoxetine Hcl) .Marland Kitchen.. 1 By Mouth Once Daily 4)  Alprazolam 0.5 Mg Tabs (Alprazolam) .Marland Kitchen.. 1po Two Times A Day As Needed 5)  Hydrocortisone Ace-Pramoxine 1-1 % Crea (Hydrocortisone Ace-Pramoxine) .... Use Asd Two Times A Day As Needed 6)  Amphetamine-Dextroamphetamine 20 Mg Tabs (Amphetamine-Dextroamphetamine) .Marland Kitchen.. 1 By Mouth Once Daily  - To Fill Sept 7, 2011 7)  Hydrocodone-Acetaminophen 5-325 Mg Tabs (Hydrocodone-Acetaminophen) .Marland Kitchen.. 1 By Mouth Q 6 Hrs As Needed Pain 8)  Flexeril 5 Mg Tabs (Cyclobenzaprine Hcl) .Marland Kitchen.. 1 By Mouth Three Times A Day As Needed 9)  Prednisone 10 Mg  Tabs (Prednisone) .... 4po Qd For 3days, Then 3po Qd For 3days, Then 2po Qd For 3days, Then 1po Qd For 3 Days, Then Stop  Current Medications (verified): 1)  Omeprazole 20 Mg Cpdr (Omeprazole) .Marland Kitchen.. 1 By Mouth Once Daily 2)  Sumatriptan Succinate 100 Mg Tabs (Sumatriptan Succinate) .Marland Kitchen.. 1 By Mouth Once Daily As Needed 3)  Fluoxetine Hcl 20 Mg Caps (Fluoxetine Hcl) .Marland KitchenMarland KitchenMarland Kitchen 1  By Mouth Once Daily 4)  Alprazolam 0.5 Mg Tabs (Alprazolam) .Marland Kitchen.. 1po Two Times A Day As Needed 5)  Hydrocortisone Ace-Pramoxine 1-1 % Crea (Hydrocortisone Ace-Pramoxine) .... Use Asd Two Times A Day As Needed 6)  Amphetamine-Dextroamphetamine 20 Mg Tabs (Amphetamine-Dextroamphetamine) .Marland Kitchen.. 1 By Mouth Once Daily  - To Fill Sept 7, 2011 7)  Hydrocodone-Acetaminophen 10-325 Mg Tabs (Hydrocodone-Acetaminophen) .Marland Kitchen.. 1po Q 6 Hrs As Needed Pain 8)  Flexeril 5 Mg Tabs (Cyclobenzaprine Hcl) .Marland Kitchen.. 1 By Mouth Three Times A Day As Needed 9)  Prednisone 10 Mg  Tabs (Prednisone) .... 4po Qd For 3days, Then 3po Qd For 3days, Then 2po Qd For 3days, Then 1po Qd For 3 Days, Then Stop  Allergies (verified): No Known Drug Allergies  Past History:  Past Medical History: Last updated:  04/26/2009 Depression obesity chronic LBP - dr Ethelene Hal and dr beane/[place] [facility] migraine Anxiety known gallstones GERD Hyperlipidemia ADD  Past Surgical History: Last updated: 02/01/2009 Caesarean section Tonsillectomy s/p disc surgury/spinal stenosis - lumbar - Nov 2009 - dr Roosevelt Locks  Social History: Last updated: 04/26/2009 Single 1 child., also raises 15yo nephew Current Smoker Alcohol use-no Drug use-no work  - Environmental manager part time student - [employer]  Risk Factors: Smoking Status: current (02/01/2009)  Review of Systems       all otherwise negative per pt -    Physical Exam  General:  alert and overweight-appearing.   Head:  normocephalic and atraumatic.   Eyes:  vision grossly intact, pupils equal, and pupils round.   Ears:  R ear normal and L ear normal.   Nose:  no external deformity and no nasal discharge.   Mouth:  no gingival abnormalities and pharynx pink and moist.   Neck:  supple and no masses.   Lungs:  normal respiratory effort and normal breath sounds.   Heart:  normal rate and regular rhythm.   Msk:  spine nontender and no paravertebral tender, swelling, erythema Extremities:  no edema, no erythema  Neurologic:  cranial nerves II-XII intact and strength normal in all extremities.  except for ? mild right hip flexion strength 4+/5 but difficult exam due to pain; also has mild dimished right patellar reflex Skin:  color normal and no rashes.   Psych:  not depressed appearing and slightly anxious.     Impression & Recommendations:  Problem # 1:  LUMBAR RADICULOPATHY, RIGHT (ICD-724.4)  Her updated medication list for this problem includes:    Hydrocodone-acetaminophen 10-325 Mg Tabs (Hydrocodone-acetaminophen) .Marland Kitchen... 1po q 6 hrs as needed pain    Flexeril 5 Mg Tabs (Cyclobenzaprine hcl) .Marland Kitchen... 1 by mouth three times a day as needed  Orders: Orthopedic Surgeon Referral (Ortho Surgeon) flare of pain most likely related to her spinal stenosis/sciatica ;  treat as above, f/u any worsening signs or symptoms and repeat pred pack by mouth, refer for f/u new orthopedic per pt reqeust  Complete Medication List: 1)  Omeprazole 20 Mg Cpdr (Omeprazole) .Marland Kitchen.. 1 by mouth once daily 2)  Sumatriptan Succinate 100 Mg Tabs (Sumatriptan succinate) .Marland Kitchen.. 1 by mouth once daily as needed 3)  Fluoxetine Hcl 20 Mg Caps (Fluoxetine hcl) .Marland Kitchen.. 1 by mouth once daily 4)  Alprazolam 0.5 Mg Tabs (Alprazolam) .Marland Kitchen.. 1po two times a day as needed 5)  Hydrocortisone Ace-pramoxine 1-1 % Crea (Hydrocortisone ace-pramoxine) .... Use asd two times a day as needed 6)  Amphetamine-dextroamphetamine 20 Mg Tabs (Amphetamine-dextroamphetamine) .Marland Kitchen.. 1 by mouth once daily  - to fill sept 7, 2011 7)  Hydrocodone-acetaminophen  10-325 Mg Tabs (Hydrocodone-acetaminophen) .Marland Kitchen.. 1po q 6 hrs as needed pain 8)  Flexeril 5 Mg Tabs (Cyclobenzaprine hcl) .Marland Kitchen.. 1 by mouth three times a day as needed 9)  Prednisone 10 Mg Tabs (Prednisone) .... 4po qd for 3days, then 3po qd for 3days, then 2po qd for 3days, then 1po qd for 3 days, then stop  Patient Instructions: 1)  stop the oxycodone 2)  start the hydrocodone 10/325 as prescribed 3)  Please take all new medications as prescribed  - the flexeril for muscle relaxer, and prednisone 4)  You will be contacted about the referral(s) to: ortho 5)  Please schedule a follow-up appointment in 6 months with CPX labs Prescriptions: PREDNISONE 10 MG  TABS (PREDNISONE) 4po qd for 3days, then 3po qd for 3days, then 2po qd for 3days, then 1po qd for 3 days, then stop  #30 x 0   Entered and Authorized by:   Corwin Levins MD   Signed by:   Corwin Levins MD on 09/20/2009   Method used:   Print then Give to Patient   RxID:   [id number] FLEXERIL 5 MG TABS (CYCLOBENZAPRINE HCL) 1 by mouth three times a day as needed  #60 x 1   Entered and Authorized by:   Corwin Levins MD   Signed by:   Corwin Levins MD on 09/20/2009   Method used:   Print then Give to Patient    RxID:   [id number] HYDROCODONE-ACETAMINOPHEN 10-325 MG TABS (HYDROCODONE-ACETAMINOPHEN) 1po q 6 hrs as needed pain  #60 x 1   Entered and Authorized by:   Corwin Levins MD   Signed by:   Corwin Levins MD on 09/20/2009   Method used:   Print then Give to Patient   RxID:   [id number]

## 2010-02-12 NOTE — Progress Notes (Signed)
Summary: Rx change  Phone Note Call from Patient Call back at Home Phone [phone]   Caller: Patient Summary of Call: pt called to ask MD to change Rx for Proctofoam. Per pt, phamracy says they do not make generic of this Rx anymore and brand-name is too expensive. Initial call taken by: Margaret Pyle, CMA,  April 30, 2009 3:37 PM  Follow-up for Phone Call        pt informed via VM Follow-up by: Margaret Pyle, CMA,  April 30, 2009 4:25 PM    New/Updated Medications: HYDROCORTISONE ACE-PRAMOXINE 1-1 % CREA (HYDROCORTISONE ACE-PRAMOXINE) use asd two times a day as needed Prescriptions: HYDROCORTISONE ACE-PRAMOXINE 1-1 % CREA (HYDROCORTISONE ACE-PRAMOXINE) use asd two times a day as needed  #1 x 1   Entered and Authorized by:   Corwin Levins MD   Signed by:   Corwin Levins MD on 04/30/2009   Method used:   Electronically to        [facility] (retail)       [address]       [place], [place]  [address]       Ph: [phone]       Fax: [phone]   RxID:   [id number]  done hardcopy to LIM side B - dahlia  Corwin Levins MD  April 30, 2009 4:20 PM

## 2010-02-12 NOTE — Progress Notes (Signed)
Summary: REFILL   Phone Note Refill Request Call back at Home Phone [phone]   Refills Requested: Medication #1:  AMPHETAMINE-DEXTROAMPHETAMINE 20 MG TABS 1 by mouth once daily  - to fill may 25 Initial call taken by: Lamar Sprinkles, CMA,  July 12, 2009 2:42 PM  Follow-up for Phone Call        Pt informed via VM, Rx in cabinet for pt pick up Follow-up by: Margaret Pyle, CMA,  July 12, 2009 3:22 PM    New/Updated Medications: AMPHETAMINE-DEXTROAMPHETAMINE 20 MG TABS (AMPHETAMINE-DEXTROAMPHETAMINE) 1 by mouth once daily  - to fill July 12, 2009 Prescriptions: AMPHETAMINE-DEXTROAMPHETAMINE 20 MG TABS (AMPHETAMINE-DEXTROAMPHETAMINE) 1 by mouth once daily  - to fill July 12, 2009  #30 x 0   Entered and Authorized by:   Corwin Levins MD   Signed by:   Corwin Levins MD on 07/12/2009   Method used:   Print then Give to Patient   RxID:   [id number]  done hardcopy to LIM side B - dahlia  Corwin Levins MD  July 12, 2009 3:11 PM

## 2010-02-12 NOTE — Progress Notes (Signed)
Summary: Refill--Adderall  Phone Note Refill Request Call back at Home Phone [phone] Message from:  Patient on August 09, 2009 9:03 AM  Refills Requested: Medication #1:  AMPHETAMINE-DEXTROAMPHETAMINE 20 MG TABS 1 by mouth once daily  - to fill june 30 Call when ready for pick up.   Initial call taken by: Lucious Groves CMA,  August 09, 2009 9:04 AM  Follow-up for Phone Call        done hardcopy to LIM side B - dahlia  Follow-up by: Corwin Levins MD,  August 09, 2009 12:28 PM  Additional Follow-up for Phone Call Additional follow up Details #1::        Pt informed via VM, Rx in cabinet for pt pick up. Additional Follow-up by: Margaret Pyle, CMA,  August 09, 2009 12:59 PM    New/Updated Medications: AMPHETAMINE-DEXTROAMPHETAMINE 20 MG TABS (AMPHETAMINE-DEXTROAMPHETAMINE) 1 by mouth once daily  - to fill August 11, 2009 Prescriptions: AMPHETAMINE-DEXTROAMPHETAMINE 20 MG TABS (AMPHETAMINE-DEXTROAMPHETAMINE) 1 by mouth once daily  - to fill August 11, 2009  #30 x 0   Entered and Authorized by:   Corwin Levins MD   Signed by:   Corwin Levins MD on 08/09/2009   Method used:   Print then Give to Patient   RxID:   [id number]

## 2010-02-12 NOTE — Progress Notes (Signed)
Summary: Med increase  Phone Note Call from Patient Call back at Home Phone [phone]   Caller: Patient Summary of Call: Pt called stating she is having increased depressive symptoms; easily upset, increased crying and moodiness. Pt says she cannot afford an OV at this time of year but is requesting an increase in dosage of Fluoxetine, please advise. Initial call taken by: Margaret Pyle, CMA,  December 17, 2009 10:11 AM  Follow-up for Phone Call        ok to go to fluoxetine 40 mg - done per emr;  pt should go to ER or [facility] for suicidal thoughts;  let us know if she feels counseling be needed (we can refer) Follow-up by: Corwin Levins MD,  December 17, 2009 10:50 AM  Additional Follow-up for Phone Call Additional follow up Details #1::        Pt advised and expressed understanding Additional Follow-up by: Margaret Pyle, CMA,  December 17, 2009 11:10 AM    New/Updated Medications: FLUOXETINE HCL 40 MG CAPS (FLUOXETINE HCL) 1po once daily Prescriptions: FLUOXETINE HCL 40 MG CAPS (FLUOXETINE HCL) 1po once daily  #90 x 3   Entered and Authorized by:   Corwin Levins MD   Signed by:   Corwin Levins MD on 12/17/2009   Method used:   Electronically to        [facility] (retail)       [address]       [place], [place]  [address]       Ph: [phone]       Fax: [phone]   RxID:   [id number]

## 2010-02-12 NOTE — Progress Notes (Signed)
Summary: Birth Control request  Phone Note Call from Patient Call back at Home Phone [phone]   Summary of Call: Patient left message on triage requesting prescription for birth control stating that she has not been able to find a GYN MD who takes her insurance. Please advise. Initial call taken by: Lucious Groves CMA,  July 30, 2009 4:58 PM  Follow-up for Phone Call        I dont understand as she has [employer]  I would try again, as most GYN take [facility] [facility] insurance Follow-up by: Corwin Levins MD,  July 30, 2009 5:08 PM  Additional Follow-up for Phone Call Additional follow up Details #1::        Pt advised to contact Insurance company for [employer] lto GYN that is in network. Additional Follow-up by: Margaret Pyle, CMA,  July 31, 2009 10:15 AM

## 2010-02-12 NOTE — Progress Notes (Signed)
Summary: Pt req  Phone Note Other Incoming   Caller: pt  Summary of Call: pt was seen and taken out of work until today (for back pain), she needs documentation faxed to her work stating that she is released to come back to work today. Call pt at [phone] Initial call taken by: Ami Bullins CMA,  July 10, 2009 8:53 AM  Follow-up for Phone Call        I called pt who stated that her employer requires a statement that pt can return to work without restrications. Per pt this is needed because of that length of time out. Pt request letter be sent to: Fax [phone] Attn Martha/Tara  Letter generated and faxed. Follow-up by: Margaret Pyle, CMA,  July 10, 2009 8:57 AM

## 2010-02-12 NOTE — Medication Information (Signed)
Summary: Prior Auth/medco  Prior Auth/medco   Imported By: Lester Hammonton 05/07/2009 09:54:27  _____________________________________________________________________  External Attachment:    Type:   Image     Comment:   External Document

## 2010-02-14 NOTE — Progress Notes (Signed)
  Phone Note Call from Patient Call back at Home Phone [phone]   Caller: Patient Call For: Corwin Levins MD Summary of Call: Pt left message on traige B, pt needs Adderrall refill, please advise. Initial call taken by: Verdell Face,  January 11, 2010 11:08 AM  Follow-up for Phone Call        done hardcopy to [facility] side B - dahlia  Follow-up by: Corwin Levins MD,  January 11, 2010 1:06 PM  Additional Follow-up for Phone Call Additional follow up Details #1::        Pt informed via VM, Rx in cabinet for pt pick up Additional Follow-up by: Margaret Pyle, CMA,  January 11, 2010 1:56 PM    Prescriptions: ADDERALL XR 30 MG XR24H-CAP (AMPHETAMINE-DEXTROAMPHETAMINE) 1po once daily  #30 x 0   Entered and Authorized by:   Corwin Levins MD   Signed by:   Corwin Levins MD on 01/11/2010   Method used:   Print then Give to Patient   RxID:   [id number]

## 2010-02-14 NOTE — Assessment & Plan Note (Signed)
Summary: discuss issue/#/cd   Vital Signs:  Patient profile:   30 year old female Height:      67 inches Weight:      266.25 pounds BMI:     41.85 O2 Sat:      98 % on Room air Temp:     99 degrees F oral Pulse rate:   93 / minute BP sitting:   106 / 70  (left arm) Cuff size:   large  Vitals Entered By: Zella Ball Ewing CMA Duncan Dull) (February 08, 2010 4:38 PM)  O2 Flow:  Room air  Preventive Care Screening     sees GYn yearly  CC: Fatigue, feeling cold, refills/RE   CC:  Fatigue, feeling cold, and refills/RE.  History of Present Illness: here for wellness and f/u - overall doing ok, except for several months onoing hypersomnia daily , nonrestorative sleep, fatigue despite getting to bed about 9pm to 4am (has to be at work at [employer]);  Pt denies CP, worsening sob, doe, wheezing, orthopnea, pnd, worsening LE edema, palps, dizziness or syncope  Pt denies new neuro symptoms such as headache, facial or extremity weakness  Pt denies polydipsia, polyuria  Overall good compliance with meds, trying to follow low chol diet, wt stable, little excercise however   No fever, wt loss, night sweats, loss of appetite or other constitutional symptoms  Overall good compliance with meds, and good tolerability.  Denies worsening depressive symptoms, suicidal ideation, or panic.  Pt states good ability with ADL's, low fall risk, home safety reviewed and adequate, no significant change in hearing or vision, trying to follow lower chol diet, and occasionally active only with regular excercise.   Preventive Screening-Counseling & Management      Drug Use:  no.    Problems Prior to Update: 1)  Hypersomnia  (ICD-780.54) 2)  Lumbar Radiculopathy, Right  (ICD-724.4) 3)  Back Pain, Acute  (ICD-724.5) 4)  Add  (ICD-314.00) 5)  Pruritus Ani  (ICD-698.0) 6)  Hyperlipidemia  (ICD-272.4) 7)  Preventive Health Care  (ICD-V70.0) 8)  Gerd  (ICD-530.81) 9)  Cholelithiasis  (ICD-574.20) 10)  Anxiety  (ICD-300.00) 11)   Common Migraine  (ICD-346.10) 12)  Back Pain, Chronic  (ICD-724.5) 13)  Depression  (ICD-311)  Medications Prior to Update: 1)  Omeprazole 20 Mg Cpdr (Omeprazole) .Marland Kitchen.. 1 By Mouth Once Daily 2)  Sumatriptan Succinate 100 Mg Tabs (Sumatriptan Succinate) .Marland Kitchen.. 1 By Mouth Once Daily As Needed 3)  Fluoxetine Hcl 40 Mg Caps (Fluoxetine Hcl) .Marland Kitchen.. 1po Once Daily 4)  Alprazolam 0.5 Mg Tabs (Alprazolam) .Marland Kitchen.. 1po Two Times A Day As Needed 5)  Hydrocortisone Ace-Pramoxine 1-1 % Crea (Hydrocortisone Ace-Pramoxine) .... Use Asd Two Times A Day As Needed 6)  Adderall Xr 30 Mg Xr24h-Cap (Amphetamine-Dextroamphetamine) .Marland Kitchen.. 1po Once Daily 7)  Hydrocodone-Acetaminophen 10-325 Mg Tabs (Hydrocodone-Acetaminophen) .Marland Kitchen.. 1po Q 6 Hrs As Needed Pain 8)  Flexeril 5 Mg Tabs (Cyclobenzaprine Hcl) .Marland Kitchen.. 1 By Mouth Three Times A Day As Needed 9)  Prednisone 10 Mg  Tabs (Prednisone) .... 4po Qd For 3days, Then 3po Qd For 3days, Then 2po Qd For 3days, Then 1po Qd For 3 Days, Then Stop  Current Medications (verified): 1)  Omeprazole 20 Mg Cpdr (Omeprazole) .Marland Kitchen.. 1 By Mouth Once Daily 2)  Sumatriptan Succinate 100 Mg Tabs (Sumatriptan Succinate) .Marland Kitchen.. 1 By Mouth Once Daily As Needed 3)  Fluoxetine Hcl 40 Mg Caps (Fluoxetine Hcl) .Marland Kitchen.. 1po Once Daily 4)  Alprazolam 0.5 Mg Tabs (Alprazolam) .Marland Kitchen.. 1po Two Times A  Day As Needed 5)  Hydrocortisone Ace-Pramoxine 1-1 % Crea (Hydrocortisone Ace-Pramoxine) .... Use Asd Two Times A Day As Needed 6)  Adderall Xr 30 Mg Xr24h-Cap (Amphetamine-Dextroamphetamine) .Marland Kitchen.. 1po Once Daily - To Fill Feb 08, 2010 7)  Hydrocodone-Acetaminophen 10-325 Mg Tabs (Hydrocodone-Acetaminophen) .Marland Kitchen.. 1po Q 6 Hrs As Needed Pain 8)  Flexeril 5 Mg Tabs (Cyclobenzaprine Hcl) .Marland Kitchen.. 1 By Mouth Three Times A Day As Needed  Allergies (verified): No Known Drug Allergies  Past History:  Past Medical History: Last updated: 04/26/2009 Depression obesity chronic LBP - dr Ethelene Hal and dr beane/[place]  [facility] migraine Anxiety known gallstones GERD Hyperlipidemia ADD  Past Surgical History: Last updated: 02/01/2009 Caesarean section Tonsillectomy s/p disc surgury/spinal stenosis - lumbar - Nov 2009 - dr Roosevelt Locks  Family History: Last updated: 02/01/2009 m-grandmother - CABG, DM, HTN, dialysis p-grandmother with breast cancer father with DM, HTN mother with depression cousin died with cancer uncle and grandfather with colon cancer uncle with renal failure/dialysis sister died with homicide 06-24-1994  Social History: Last updated: 04/26/2009 Single 1 child., also raises 15yo nephew Current Smoker Alcohol use-no Drug use-no work  - Environmental manager part time student - [employer]  Risk Factors: Smoking Status: current (02/01/2009)  Review of Systems  The patient denies anorexia, fever, vision loss, decreased hearing, hoarseness, chest pain, syncope, dyspnea on exertion, peripheral edema, prolonged cough, headaches, hemoptysis, abdominal pain, melena, hematochezia, severe indigestion/heartburn, hematuria, muscle weakness, suspicious skin lesions, transient blindness, difficulty walking, depression, unusual weight change, abnormal bleeding, enlarged lymph nodes, and angioedema.         all otherwise negative per pt -    Physical Exam  General:  alert and overweight-appearing.   Head:  normocephalic and atraumatic.   Eyes:  vision grossly intact, pupils equal, and pupils round.   Ears:  R ear normal and L ear normal.   Nose:  no external deformity and no nasal discharge.   Mouth:  no gingival abnormalities and pharynx pink and moist.   Neck:  supple and no masses.   Lungs:  normal respiratory effort and normal breath sounds.   Heart:  normal rate and regular rhythm.   Abdomen:  soft, non-tender, and normal bowel sounds.   Msk:  spine nontender and no paravertebral tender, swelling, erythema Extremities:  no edema, no erythema  Neurologic:  strength normal in all extremities and gait  normal.   Skin:  color normal and no rashes.   Psych:  normally interactive, not depressed appearing, and slightly anxious.     Impression & Recommendations:  Problem # 1:  Preventive Health Care (ICD-V70.0) Overall doing well, age appropriate education and counseling updated, referral for preventive services and immunizations addressed, dietary counseling and smoking status adressed , most recent labs reviewed I have personally reviewed and have noted 1.The patient's medical and social history 2.Their use of alcohol, tobacco or illicit drugs 3.Their current medications and supplements 4. Functional ability including ADL's, fall risk, home safety risk, hearing & visual impairment 5.Diet and physical activities 6.Evidence for depression or mood disorders The patients weight, height, BMI  have been recorded in the chart I have made referrals, counseling and provided education to the patient based review of the above  Orders: TLB-BMP (Basic Metabolic Panel-BMET) (80048-METABOL) TLB-CBC Platelet - w/Differential (85025-CBCD) TLB-Hepatic/Liver Function Pnl (80076-HEPATIC) TLB-Lipid Panel (80061-LIPID) TLB-TSH (Thyroid Stimulating Hormone) (84443-TSH) TLB-Udip ONLY (81003-UDIP)  Problem # 2:  HYPERSOMNIA (ICD-780.54) suspicious for OSA - refer to pulm Orders: Pulmonary Referral (Pulmonary)  Complete Medication List: 1)  Omeprazole 20 Mg Cpdr (Omeprazole) .Marland Kitchen.. 1 by mouth once daily 2)  Sumatriptan Succinate 100 Mg Tabs (Sumatriptan succinate) .Marland Kitchen.. 1 by mouth once daily as needed 3)  Fluoxetine Hcl 40 Mg Caps (Fluoxetine hcl) .Marland Kitchen.. 1po once daily 4)  Alprazolam 0.5 Mg Tabs (Alprazolam) .Marland Kitchen.. 1po two times a day as needed 5)  Hydrocortisone Ace-pramoxine 1-1 % Crea (Hydrocortisone ace-pramoxine) .... Use asd two times a day as needed 6)  Adderall Xr 30 Mg Xr24h-cap (Amphetamine-dextroamphetamine) .Marland Kitchen.. 1po once daily - to fill Feb 08, 2010 7)  Hydrocodone-acetaminophen 10-325 Mg Tabs  (Hydrocodone-acetaminophen) .Marland Kitchen.. 1po q 6 hrs as needed pain 8)  Flexeril 5 Mg Tabs (Cyclobenzaprine hcl) .Marland Kitchen.. 1 by mouth three times a day as needed  Patient Instructions: 1)  Continue all previous medications as before this visit  2)  You will be contacted about the referral(s) to: pulmonary 3)  Please go to the Lab in the basement for your blood and/or urine tests today 4)  Please call the number on the [facility] for results of your testing  5)  Please schedule a follow-up appointment in 1 year, or sooner if needed Prescriptions: ADDERALL XR 30 MG XR24H-CAP (AMPHETAMINE-DEXTROAMPHETAMINE) 1po once daily - to fill Feb 08, 2010  #30 x 0   Entered and Authorized by:   Corwin Levins MD   Signed by:   Corwin Levins MD on 02/08/2010   Method used:   Print then Give to Patient   RxID:   [id number] FLEXERIL 5 MG TABS (CYCLOBENZAPRINE HCL) 1 by mouth three times a day as needed  #60 x 1   Entered and Authorized by:   Corwin Levins MD   Signed by:   Corwin Levins MD on 02/08/2010   Method used:   Print then Give to Patient   RxID:   [id number] HYDROCODONE-ACETAMINOPHEN 10-325 MG TABS (HYDROCODONE-ACETAMINOPHEN) 1po q 6 hrs as needed pain  #60 x 1   Entered and Authorized by:   Corwin Levins MD   Signed by:   Corwin Levins MD on 02/08/2010   Method used:   Print then Give to Patient   RxID:   [id number] SUMATRIPTAN SUCCINATE 100 MG TABS (SUMATRIPTAN SUCCINATE) 1 by mouth once daily as needed  #9 x 11   Entered and Authorized by:   Corwin Levins MD   Signed by:   Corwin Levins MD on 02/08/2010   Method used:   Print then Give to Patient   RxID:   [id number]    Orders Added: 1)  Pulmonary Referral [Pulmonary] 2)  TLB-BMP (Basic Metabolic Panel-BMET) [80048-METABOL] 3)  TLB-CBC Platelet - w/Differential [85025-CBCD] 4)  TLB-Hepatic/Liver Function Pnl [80076-HEPATIC] 5)  TLB-Lipid Panel [80061-LIPID] 6)  TLB-TSH (Thyroid Stimulating Hormone) [84443-TSH] 7)  TLB-Udip ONLY  [81003-UDIP] 8)  Est. Patient 18-39 years [42595]

## 2010-03-11 ENCOUNTER — Institutional Professional Consult (permissible substitution): Payer: Self-pay | Admitting: Pulmonary Disease

## 2010-03-15 ENCOUNTER — Other Ambulatory Visit: Payer: Self-pay

## 2010-03-28 ENCOUNTER — Ambulatory Visit: Payer: Self-pay | Admitting: Internal Medicine

## 2010-03-28 DIAGNOSIS — Z0289 Encounter for other administrative examinations: Secondary | ICD-10-CM

## 2010-03-29 ENCOUNTER — Encounter: Payer: Self-pay | Admitting: Internal Medicine

## 2010-03-29 ENCOUNTER — Ambulatory Visit (INDEPENDENT_AMBULATORY_CARE_PROVIDER_SITE_OTHER): Payer: BC Managed Care – PPO | Admitting: Internal Medicine

## 2010-03-29 DIAGNOSIS — F411 Generalized anxiety disorder: Secondary | ICD-10-CM

## 2010-03-29 DIAGNOSIS — F988 Other specified behavioral and emotional disorders with onset usually occurring in childhood and adolescence: Secondary | ICD-10-CM

## 2010-03-29 DIAGNOSIS — F17200 Nicotine dependence, unspecified, uncomplicated: Secondary | ICD-10-CM | POA: Insufficient documentation

## 2010-03-29 DIAGNOSIS — J029 Acute pharyngitis, unspecified: Secondary | ICD-10-CM

## 2010-04-02 NOTE — Assessment & Plan Note (Signed)
Summary: sore throat/sinus inf/cd   Vital Signs:  Patient profile:   30 year old female Height:      67 inches Weight:      265.25 pounds BMI:     41.69 O2 Sat:      97 % on Room air Temp:     98.5 degrees F oral Pulse rate:   102 / minute BP sitting:   122 / 80  (left arm) Cuff size:   large  Vitals Entered By: Zella Ball Ewing CMA (AAMA) (March 29, 2010 3:09 PM)  O2 Flow:  Room air CC: Sore throat/RE   CC:  Sore throat/RE.  History of Present Illness: here with 3 days fever, HA, Severe ST, but no earache, cough and Pt denies CP, worsening sob, doe, wheezing, orthopnea, pnd, worsening LE edema, palps, dizziness or syncope  Pt denies new neuro symptoms such as headache, facial or extremity weakness  Pt denies polydipsia, polyuria Overall good compliance with meds, trying to follow low chol diet.  Would like to change the adderall SR 30 mg daily back to the 20 mg two times a day due to better efficacy, abilyt to concentrate and task completion.   Does want to quit smoking and ready to consider chantix.  Overall good compliance with meds, and good tolerability.  No recent wt loss, night sweats, loss of appetite or other constitutional symptoms except with acute symtpoms above.  Denies worsening depressive symptoms, suicidal ideation, or panic, though does have ongoing anxiety., though not worse recently.   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 1 By Mouth  Once Daily 2)  Sumatriptan Succinate 100 Mg Tabs (Sumatriptan Succinate) .Marland Kitchen.. 1 By Mouth Once Daily As Needed 3)  Fluoxetine Hcl 40 Mg Caps (Fluoxetine Hcl) .Marland Kitchen.. 1po Once Daily 4)  Alprazolam 0.5 Mg Tabs (Alprazolam) .Marland Kitchen.. 1po Two Times A Day As Needed 5)  Hydrocortisone Ace-Pramoxine 1-1 % Crea (Hydrocortisone Ace-Pramoxine) .... Use Asd Two Times A Day As Needed 6)  Adderall Xr 30 Mg Xr24h-Cap (Amphetamine-Dextroamphetamine) .Marland Kitchen.. 1po Once Daily - To Fill Feb 08, 2010 7)  Hydrocodone-Acetaminophen 10-325 Mg Tabs (Hydrocodone-Acetaminophen) .Marland Kitchen.. 1po Q 6 Hrs As Needed Pain 8)  Flexeril 5 Mg Tabs (Cyclobenzaprine Hcl) .Marland Kitchen.. 1 By Mouth Three Times A Day As Needed  Current Medications (verified): 1)  Omeprazole 20 Mg Cpdr (Omeprazole) .Marland Kitchen.. 1 By Mouth Once Daily 2)  Sumatriptan Succinate 100 Mg Tabs (Sumatriptan Succinate) .Marland Kitchen.. 1 By Mouth Once Daily As Needed 3)  Fluoxetine Hcl 40 Mg Caps (Fluoxetine Hcl) .Marland Kitchen.. 1po Once Daily 4)  Alprazolam 0.5 Mg Tabs (Alprazolam) .Marland Kitchen.. 1po Two Times A Day As Needed 5)  Hydrocortisone Ace-Pramoxine 1-1 % Crea (Hydrocortisone Ace-Pramoxine) .... Use Asd Two Times A Day As Needed 6)  Adderall 20 Mg Tabs (Amphetamine-Dextroamphetamine) .... Generic - 1 By Mouth Two Times A Day - To Fill Mar 29, 2010 7)  Hydrocodone-Acetaminophen 10-325 Mg Tabs (Hydrocodone-Acetaminophen) .Marland Kitchen.. 1po Q 6 Hrs  As Needed Pain 8)  Flexeril 5 Mg Tabs (Cyclobenzaprine Hcl) .Marland Kitchen.. 1 By Mouth Three Times A Day As Needed 9)  Azithromycin 250 Mg Tabs (Azithromycin) .... 2po Qd For 1 Day, Then 1po Qd For 4days, Then Stop 10)  Chantix Continuing Month Pak 1 Mg Tabs (Varenicline Tartrate) .... Use Asd 1 By Mouth Two Times A Day  Allergies (verified): No Known Drug Allergies  Past History:  Past Medical History: Last updated: 04/26/2009 Depression obesity chronic LBP - dr Ethelene Hal and dr beane/[place] [facility] migraine Anxiety known gallstones GERD Hyperlipidemia ADD  Past Surgical History: Last  updated: 02/01/2009 Caesarean section Tonsillectomy s/p disc surgury/spinal stenosis - lumbar - Nov 2009 - dr Roosevelt Locks  Social History: Last updated: 04/26/2009 Single 1 child., also raises 15yo nephew Current Smoker Alcohol use-no Drug use-no work  - Environmental manager part time student - [employer]  Risk Factors: Smoking Status: current (02/01/2009)  Review of Systems       all otherwise negative per pt -    Physical Exam  General:  alert and overweight-appearing. , mild ill  Head:  normocephalic and atraumatic.   Eyes:  vision grossly intact, pupils equal, and pupils round.   Ears:  bilat tm's red, sinus nontender Nose:  nasal dischargemucosal pallor and mucosal edema.   Mouth:  pharyngeal erythema and pharyngeal exudate.   Neck:  supple and cervical lymphadenopathy.   Lungs:  normal respiratory effort and normal breath sounds.   Heart:  normal rate and regular rhythm.   Extremities:  no edema, no erythema  Psych:  not depressed appearing and slightly anxious.     Impression & Recommendations:  Problem # 1:  PHARYNGITIS-ACUTE (ICD-462)  Her updated medication list for this problem includes:    Azithromycin 250 Mg Tabs (Azithromycin) .Marland Kitchen... 2po qd for 1 day, then 1po qd for 4days, then stop treat as above, f/u any worsening signs or symptoms   Instructed to complete antibiotics and call if not improved in 48 hours.   Problem # 2:  ADD (ICD-314.00) ok to change  back to adderall 20 bidl; f/u any worsening symptoms   Problem # 3:  SMOKER (ICD-305.1)  Her updated medication list for this problem includes:    Chantix Continuing Month Pak 1 Mg Tabs (Varenicline tartrate) ..... Use asd 1 by mouth two times a day d/w pt - ready to quit, d/w pt pros and cons of chantix - /treat as above, f/u any worsening signs or symptoms   Problem # 4:  ANXIETY (ICD-300.00)  Her updated medication list for this problem includes:    Fluoxetine Hcl 40 Mg Caps (Fluoxetine hcl) .Marland Kitchen... 1po once  daily    Alprazolam 0.5 Mg Tabs (Alprazolam) .Marland Kitchen... 1po two times a day as needed  Discussed medication use and relaxation techniques.  stable overall by hx and exam, ok to continue meds/tx as is   Complete Medication List: 1)  Omeprazole 20 Mg Cpdr (Omeprazole) .Marland Kitchen.. 1 by mouth once daily 2)  Sumatriptan Succinate 100 Mg Tabs (Sumatriptan succinate) .Marland Kitchen.. 1 by mouth once daily as needed 3)  Fluoxetine Hcl 40 Mg Caps (Fluoxetine hcl) .Marland Kitchen.. 1po once daily 4)  Alprazolam 0.5 Mg Tabs (Alprazolam) .Marland Kitchen.. 1po two times a day as needed 5)  Hydrocortisone Ace-pramoxine 1-1 % Crea (Hydrocortisone ace-pramoxine) .... Use asd two times a day as needed 6)  Adderall 20 Mg Tabs (Amphetamine-dextroamphetamine) .... Generic - 1 by mouth two times a day - to fill Mar 29, 2010 7)  Hydrocodone-acetaminophen 10-325  Mg Tabs (Hydrocodone-acetaminophen) .Marland Kitchen.. 1po q 6 hrs as needed pain 8)  Flexeril 5 Mg Tabs (Cyclobenzaprine hcl) .Marland Kitchen.. 1 by mouth three times a day as needed 9)  Azithromycin 250 Mg Tabs (Azithromycin) .... 2po qd for 1 day, then 1po qd for 4days, then stop 10)  Chantix Continuing Month Pak 1 Mg Tabs (Varenicline tartrate) .... Use asd 1 by mouth two times a day  Patient Instructions: 1)  Please take all new medications as prescribed 2)  Continue all previous medications as before this visit , including the adderall at 20 mg two times a day  3)  Please schedule a follow-up appointment as needed. Prescriptions: ADDERALL 20 MG TABS (AMPHETAMINE-DEXTROAMPHETAMINE) generic - 1 by mouth two times a day - to fill Mar 29, 2010  #60 x 0   Entered and Authorized by:   Corwin Levins MD   Signed by:   Corwin Levins MD on 03/29/2010   Method used:   Print then Give to Patient   RxID:   [id number] CHANTIX CONTINUING MONTH PAK 1 MG TABS (VARENICLINE TARTRATE) use asd 1 by mouth two times a day  #1pk x 1   Entered and Authorized by:   Corwin Levins MD   Signed by:   Corwin Levins MD on 03/29/2010   Method used:    Print then Give to Patient   RxID:   [id number] CHANTIX STARTING MONTH PAK 0.5 MG X 11 & 1 MG X 42 TABS (VARENICLINE TARTRATE) use asd 1 by mouth two times a day  #1pk x 0   Entered and Authorized by:   Corwin Levins MD   Signed by:   Corwin Levins MD on 03/29/2010   Method used:   Print then Give to Patient   RxID:   [id number] AZITHROMYCIN 250 MG TABS (AZITHROMYCIN) 2po qd for 1 day, then 1po qd for 4days, then stop  #6 x 1   Entered and Authorized by:   Corwin Levins MD   Signed by:   Corwin Levins MD on 03/29/2010   Method used:   Print then Give to Patient   RxID:   [id number]    Orders Added: 1)  Est. Patient Level IV [43329]

## 2010-04-08 ENCOUNTER — Other Ambulatory Visit: Payer: Self-pay | Admitting: Internal Medicine

## 2010-04-08 DIAGNOSIS — M549 Dorsalgia, unspecified: Secondary | ICD-10-CM

## 2010-04-08 NOTE — Telephone Encounter (Signed)
Pharmacy requesting refill on patients Hydrocodone Acetaminophen

## 2010-04-08 NOTE — Telephone Encounter (Signed)
Pt has been seeing Dr Ethelene Hal and Dr Shelle Iron in the past;  I am uncomfortable with ongoing pain prescriptions;  I believe pt was to be referred to new orthopedic at one point last yr;    I can refer to ortho if she wants, or pain clinic if she wants

## 2010-04-08 NOTE — Telephone Encounter (Signed)
Called patient and informed of MD's instructions. She has not been seen at [facility] [facility] due to money owed by [employer] and unable to see other ortho referral due to the same reason. At this time she  Would like to be referred to pain clinic.

## 2010-04-09 ENCOUNTER — Encounter: Payer: Self-pay | Admitting: Internal Medicine

## 2010-04-09 ENCOUNTER — Telehealth: Payer: Self-pay

## 2010-04-09 MED ORDER — HYDROCODONE-ACETAMINOPHEN 10-325 MG PO TABS: 1.0000 | ORAL_TABLET | Freq: Two times a day (BID) | ORAL | Status: DC | PRN

## 2010-04-09 NOTE — Telephone Encounter (Signed)
A user error has taken place: encounter opened in error, closed for administrative reasons.

## 2010-04-09 NOTE — Telephone Encounter (Signed)
I will do total 2 mo pain prescription refill, after which I will need her to f/u with pain clinic, to which I will refer  Done hardcopy to Tina [name]/LIM B

## 2010-04-09 NOTE — Telephone Encounter (Signed)
Pt informed via VM, Rx faxed to pharmacy

## 2010-04-09 NOTE — Telephone Encounter (Signed)
Refused refill due to inappropriate at this time. Did call patient and informed of MD's instructions. Patient would like a referral to pain clinic.

## 2010-04-25 ENCOUNTER — Other Ambulatory Visit (INDEPENDENT_AMBULATORY_CARE_PROVIDER_SITE_OTHER): Payer: BC Managed Care – PPO

## 2010-04-25 ENCOUNTER — Telehealth: Payer: Self-pay

## 2010-04-25 DIAGNOSIS — R350 Frequency of micturition: Secondary | ICD-10-CM

## 2010-04-25 LAB — URINALYSIS, ROUTINE W REFLEX MICROSCOPIC
Bilirubin Urine: NEGATIVE
Specific Gravity, Urine: >=1.03 (ref 1.000–1.030)
Total Protein, Urine: 100
Urine Glucose: NEGATIVE
pH: 6 (ref 5.0–8.0)

## 2010-04-25 NOTE — Telephone Encounter (Signed)
Pt called stating she believes she has an UTI, odor, frequency and decreased volume. Pt has aapt scheduled 04/17 but is requesting UA, please advise.

## 2010-04-25 NOTE — Telephone Encounter (Signed)
Pt advised via VM, told to call back with any further questions or concerns,

## 2010-04-25 NOTE — Telephone Encounter (Signed)
Done per emr 

## 2010-04-26 ENCOUNTER — Telehealth: Payer: Self-pay | Admitting: Internal Medicine

## 2010-04-26 ENCOUNTER — Encounter: Payer: Self-pay | Admitting: Internal Medicine

## 2010-04-26 DIAGNOSIS — Z0000 Encounter for general adult medical examination without abnormal findings: Secondary | ICD-10-CM | POA: Insufficient documentation

## 2010-04-26 MED ORDER — CEPHALEXIN 500 MG PO CAPS: 500.0000 mg | ORAL_CAPSULE | Freq: Four times a day (QID) | ORAL | Status: AC

## 2010-04-26 NOTE — Telephone Encounter (Signed)
Pt advised of results and Rx

## 2010-04-26 NOTE — Telephone Encounter (Signed)
Message copied by Oliver Barre on Fri Apr 26, 2010  9:12 AM ------      Message from: Margaret Pyle      Created: Fri Apr 26, 2010  8:03 AM      Regarding: Pt's UA results were sent to me instead...                   ----- Message -----         From: Lab In [facility] Interface         Sent: 04/25/2010   4:46 PM           To: Margaret Pyle, CMA

## 2010-04-26 NOTE — Telephone Encounter (Signed)
UA with prob UTI - ok to tx, pt has f/u appt apr 17  Done per emr

## 2010-04-30 ENCOUNTER — Ambulatory Visit: Payer: BC Managed Care – PPO | Admitting: Internal Medicine

## 2010-04-30 DIAGNOSIS — Z0289 Encounter for other administrative examinations: Secondary | ICD-10-CM

## 2010-05-13 ENCOUNTER — Other Ambulatory Visit: Payer: Self-pay

## 2010-05-13 MED ORDER — AMPHETAMINE-DEXTROAMPHETAMINE 20 MG PO TABS: 20.0000 mg | ORAL_TABLET | Freq: Two times a day (BID) | ORAL | Status: DC

## 2010-05-13 NOTE — Telephone Encounter (Signed)
Pt informed, Rx in cabinet for pt pick up  

## 2010-05-27 ENCOUNTER — Ambulatory Visit: Payer: BC Managed Care – PPO | Admitting: Physical Medicine & Rehabilitation

## 2010-05-27 ENCOUNTER — Encounter: Payer: BC Managed Care – PPO | Attending: Physical Medicine & Rehabilitation

## 2010-05-27 DIAGNOSIS — Z79899 Other long term (current) drug therapy: Secondary | ICD-10-CM | POA: Insufficient documentation

## 2010-05-27 DIAGNOSIS — M79609 Pain in unspecified limb: Secondary | ICD-10-CM | POA: Insufficient documentation

## 2010-05-27 DIAGNOSIS — IMO0002 Reserved for concepts with insufficient information to code with codable children: Secondary | ICD-10-CM

## 2010-05-27 DIAGNOSIS — M961 Postlaminectomy syndrome, not elsewhere classified: Secondary | ICD-10-CM

## 2010-05-28 NOTE — Op Note (Signed)
Tina [name], Tina [name]             ACCOUNT NO.:  [id number]   MEDICAL RECORD NO.:  [id number]          PATIENT TYPE:  OIB   LOCATION:  1540                         FACILITY:  [facility]   PHYSICIAN:  Jene Every, M.D.    DATE OF BIRTH:  [birth year]   DATE OF PROCEDURE:  11/17/2007  DATE OF DISCHARGE:                               OPERATIVE REPORT   PREOPERATIVE DIAGNOSES:  Spinal stenosis, herniated nucleus pulposus, L5-  S1 right.   POSTOPERATIVE DIAGNOSES:  Spinal stenosis, herniated nucleus pulposus,  L5-S1 right.   PROCEDURE PERFORMED:  1. Lumbar decompression L5-S1.  2. Right foraminotomy of S1 on the right.  3. Microdiskectomy of L5-S1 on the right.   ANESTHESIA:  General.   ASSISTANT:  Roma Schanz, P.A.   Technical difficulty increased with the patient morbidly obese with  significant subcutaneous adipose tissue.   The patient in supine position.  After the induction of adequate  anesthesia, 2 g Kefzol, she was placed prone on the [place] frame.  All  bony prominences were well-padded.  Lumbar region prepped and draped in  the usual sterile fashion.  Two 1i-gauge spinal needles utilized to  localize the 4-5 and 5-1 interspace, confirmed with x-ray.  Incision was  made over the 5-1 interspace.  Subcutaneous tissue was dissected and  electrocautery was utilized to achieve hemostasis.  The patient had a  significant subcutaneous adipose tissue which required resection of the  subcutaneous adipose tissue to identify the dorsal lumbar fascia.  The  patient had approximately 30 __________ of subcutaneous adipose tissue.  With deep Gelpi retractors and Weitlaners placed in the subcutaneous  tissue, the dorsal lumbar fascia was identified and divided in the line  of the skin incision.  Paraspinous muscle elevated from lamina of 5 and  1, and we had to infiltrate the paraspinous musculature with Marcaine  with epinephrine.  Confirmatory radiograph obtained with a Penfield 4  in  the interlaminar space by x-ray.  Operating microscope was draped and  brought in the surgical field.  Hemilaminotomy of the caudad edge of S1  was performed with a 2-mm Kerrison, a neural patty placed beneath the  ligamentum flavum.  Hemilaminotomy at the caudad edge of 5 was performed  as well, detaching the ligamentum flavum and ligamentum flavum removed  from the interspace.  There was significant lateral recess stenosis,  multifactorial.  This was decompressed.  At the medial border of the  pedicle, foraminotomy of S1 was performed.  The S1 nerve was identified,  gently mobilized medially.  It had been compressed into the lateral  recess.  There was a focal HNP beneath that.  With this, the nerve root  well-protected, I performed an annulotomy and a copious portion of disk  material was removed from the disk space.  With a straight pituitary and  a micro pituitary, multiple fragments were excised, further mobilized  with a nerve hook, an Epstein.  Full diskectomy of herniated material  was performed.  A hockey stick probe was then placed cephalad and caudad  from superior to the pedicle of 5 down the S1 foramen, out the  foramen  of 5 beneath thecal sac and checked the axilla of the root and the  shoulder, there was no residual neural compression noted.  There was at  least 1 cm of excursion of the S1 nerve root beneath the pedicle without  tension.  The disk space copiously irrigated with antibiotic irrigation.  Inspection revealed no evidence of active bleeding or CSF leakage.  Placed some thrombin-soaked Gelfoam in the laminotomy defect.  Next the  [facility] retractor was removed.  Paraspinous muscle inspected with no  evidence of active bleeding.  Copiously irrigated and repaired the  dorsal lumbar fascia with #1 Vicryl interrupted figure-of-eight sutures  into the adipose tissue, was somewhat difficult.  Subcu in layers of 2-0  Vicryl simple sutures.  Skin was reapproximated  with 4-0 subcuticular  Prolene.  Wound reinforced with Steri-Strips, sterile dressing applied,  placed supine on a hospital bed, extubated without difficulty and  transported to recovery in satisfactory condition.   The patient tolerated the procedure well with no complications.  Assistant Roma Schanz.  Minimal blood loss.      Jene Every, M.D.  Electronically Signed     JB/MEDQ  D:  11/17/2007  T:  11/17/2007  Job:  [id number]

## 2010-05-31 NOTE — Discharge Summary (Signed)
[name], [name]             ACCOUNT NO.:  [id number]   MEDICAL RECORD NO.:  [id number]          PATIENT TYPE:  INP   LOCATION:  9122                          FACILITY:  [facility]   PHYSICIAN:  Lesly Dukes, M.D. DATE OF BIRTH:  [birth year]   DATE OF ADMISSION:  05/21/2004  DATE OF DISCHARGE:  05/26/2004                                 DISCHARGE SUMMARY   DISCHARGE DIAGNOSES:  1.  Intrauterine pregnancy.  2.  Endometritis.   PROCEDURE:  Low transverse cesarean section for nonreassuring fetal heart  tones.   DISCHARGE MEDICATIONS:  1.  Percocet 5/325 one p.o. q.4h p.r.n. pain.  2.  Prenatal vitamins one p.o. daily.  3.  Calcium with vitamin D 500 mg one p.o. t.i.d.  4.  Colace 100 mg one p.o. b.i.d.   LABORATORY DATA:  Hemoglobin 8.9 on May 24, 2004.   HOSPITAL COURSE:  [name] is a 30 year old gravida 2, para 0-0-1-0, who  presented at 41-6/7 weeks to labor and delivery for induction of labor for  post dates.  She was given Cervidil over 2 days and Pitocin with very slow  progress.  She had a low transverse cesarean section for nonreassuring fetal  heart tones and delivered a viable female with Apgars of 8 at one minute and 9  at five minutes.  She subsequently developed endometritis postpartum and was  given ampicillin, gentamicin, and Clindamycin.  She recovered nicely from  this and was afebrile for greater than 24 hours upon discharge.  On  discharge, she was stable and comfortable.   DISCHARGE INSTRUCTIONS:  The patient is to return to clinic in 2 days for  staple removal.  [facility] for 6-week postpartum check.  The patient  is to call or come back to maternity admission unit for any type of fever,  chills, increased abdominal or pelvic pain, dizziness, weakness, or any  other concerns worrisome to her.   CONDITION ON DISCHARGE:  Stable, discharged to home.   Miles Costain, M.D. dictating for Lesly Dukes, M.D.      KHL/MEDQ  D:  05/26/2004  T:   05/27/2004  Job:  [id number]

## 2010-05-31 NOTE — Procedures (Signed)
[place]  Patient:    Tina [name], Tina [name]                      MRN: [id number] Proc. Date: 12/25/99 Adm. Date:  [id number] Attending:  Lesly Dukes CC:         [facility].   Procedure Report  PROCEDURE:  Lumbar puncture.  HISTORY:  This is a 30 year old patient with a history of headache, mild bilateral papilledema.  This patient is being evaluated for possible pseudotumor cerebri syndrome.  DESCRIPTION OF PROCEDURE:  Lumbar puncture was performed today.  The patient was explained the risks of the procedure, including bleeding, nerve damage, headache, and the risk of introducing infection.  The patient understood the procedure and consented to the procedure.  The patient was placed in the fetal position on the left side, and the lower back was cleaned with Betadine solution.  Approximately 2 cc of 1% Xylocaine was used as a local anesthetic. A 20-gauge spinal needle was inserted into the L3-4 interspace, and approximately 16 cc of clear, colorless spinal fluid was removed for testing. Opening pressure was 270 mmH2O.  The patient seemed to tolerate the procedure well.  No complications of the procedure were noted.  Tube #1 was sent for VDRL and cryptococcal antigen.  Tube #2 and 4 were saved.  Tube #3 was sent for cells, differential, glucose, and protein.  No complications of the above procedure were noted.  ADDENDUM:  This patient was begun on Diamox for pseudotumor cerebri at 250 mg tablet taking one twice a day. DD:  12/25/99 TD:  12/25/99 Job: [id number]

## 2010-05-31 NOTE — Group Therapy Note (Signed)
Tina [name], Tina [name] NO.:  [id number]   MEDICAL RECORD NO.:  [id number]          PATIENT TYPE:  WOC   LOCATION:  [facility]                   FACILITY:  [facility]   PHYSICIAN:  Tinnie Gens, MD        DATE OF BIRTH:  01-04-1981   DATE OF SERVICE:                                    CLINIC NOTE   CHIEF COMPLAINT:  Abnormal bleeding, Pap smear.   HISTORY OF PRESENT ILLNESS:  The patient is a 30 year old gravida 2, para 1-  0-1-1 who is status post cesarean section, and on May 23, 2004, she received  Depo-Provera at the time of discharge, and since that time has been bleeding  almost daily.  The patient has been seen in the [facility] ED, and had a  hemoglobin of 14.3 after 6 months of bleeding.  The patient is here today  for a complete workup of this.  The patient also is here for a yearly exam.  Her last Pap smear was October 18, 2003, and was normal.   PAST MEDICAL HISTORY:  Significant for cholelithiasis.   PAST SURGICAL HISTORY:  Tonsillectomy.   ALLERGIES:  None known.   MEDICATIONS:  Tylenol 2 tabs 2-3 times a day.   FAMILY HISTORY:  Significant for diabetes, hypertension, heart disease,  thyroid problems and cancer of the breast and colon.   SOCIAL HISTORY:  She does smoke 2-3 cigarettes per day.   REVIEW OF SYSTEMS:  A 14-point review of systems was reviewed, and is  negative except as in the HPI.  Of note, her weight is down below her pre-  pregnancy start weight.   OBSTETRICAL HISTORY:  She is a G2, P1 with one TAB at 6 weeks' in 1999, and  full-term C section for nonreassuring fetal status with a cervical dilation  of 2.0 cm.   PHYSICAL EXAMINATION:  VITAL SIGNS:  Her vital signs are as noted on the  chart.  GENERAL:  She is a well-developed, well-nourished, moderately obese white  female in no acute distress.  ABDOMEN:  Soft, nontender, and nondistended.  GU:  Normal external female genitalia.  BUS normal.  Uterus is very small,  anteverted,  nontender.  Cervix is very posterior without lesions.  The  vagina is pink and rugated.  The adnexa are without mass or tenderness.   IMPRESSION:  1.  Annual exam with Pap smear.  2.  Abnormal uterine bleeding probably related to Depo-Provera.   PLAN:  1.  Pap smear today.  2.  Start Loestrin after a negative UPT today.  May start today to stop her      bleeding.  Two sample pill packs were given to the patient, and      prescription given so that this can continue to be used as her birth      control method.  The patient will call back with any difficulties with      this or if she fails to stop bleeding.           ______________________________  Tinnie Gens, MD     TP/MEDQ  D:  10/31/2004  T:  10/31/2004  Job:  [id number]

## 2010-06-11 ENCOUNTER — Telehealth: Payer: Self-pay | Admitting: *Deleted

## 2010-06-11 NOTE — Telephone Encounter (Signed)
PA requested for: Amphetamine-Dextroamphetamine 20mg  from [facility] [address] including PA forms from [place]. Completed and filed forms [05/28/10] Received notice from [facility] that patient could not be located in their system.  Called pharmacy requesting correct insurance information. Contacted [employer] [place] and was directed to [employer] @ [phone] Requested new PA form [06/04/10] Form recvd, completed & forwarded for Approval [06/05/10]  Approval recvd & faxed to pharmacy @ [phone] [06/05/10]

## 2010-06-12 NOTE — Consult Note (Signed)
CHIEF COMPLAINT:  Right lower extremity pain.  A 30 year old female who has chief complaint of right lower extremity pain which goes down her back of her thigh and goes into the proximal leg on the right side only.  She also has some back pain but this is mild compared to this leg pain.  She has had workup for this through Dr. Shelle Iron from Orthopedics.  She underwent some injections preoperatively with Dr. Ethelene Hal but eventually underwent lumbar decompression L5-S1 right foraminotomy and microdiskectomy L5-S1.  Date of surgery was November 17, 2007.  The patient's average pain is 8/10, current 6, described as sharp, burning, dull, stabbing, and aching.  Pain worse in morning, evening, sometimes nighttime hours.  Walking tolerance 15-30 minutes. She is able to climb steps.  She can drive.  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  Depression and anxiety positive on review of systems.  SOCIAL HISTORY:  Single, lives with father and son.  Smokes 4 cigarettes per day.  FAMILY HISTORY:  Heart disease, lung disease, diabetes, and hypertension.  MEDICATIONS TRIED IN THE PAST:  She has tried Neurontin which triggered her migraines, tramadol triggered her migraines.  She had some problem with Percocet.  She seems to tolerate the Norco, takes 1 tablet twice a day, sometimes even half a tablet twice a day.  She tried Flexeril, this is really not helpful for her.  She has tried Lyrica in the past which was helpful for her but does not really remember why she stopped it. She has never tried nortriptyline.  PHYSICAL EXAMINATION:  VITAL SIGNS:  Blood pressure 130/79, pulse 94, respirations 18, O2 saturation 100% on room air. GENERAL:  No acute distress.  Orientation x3.  Affect alert. NEUROLOGIC:  Sensation decreased in the right L4, L5, and S1 dermatomal distribution.  Motor strength is 5/5 bilateral upper and lower extremities.  Range of motion is mildly diminished in bilateral shoulders as  well as bilateral lateral hips in terms of end-range internal and external rotation.  Otherwise, range of motion is intact. She does have positive straight leg raising test in the right lower extremity and with pain going down below the knee.  IMPRESSION: 1. Chronic L4 radiculopathy.  Her lower extremity complaints are much     greater than back complaints.  She has seen Surgery as well as PM     and R over at [facility].  No significant relief with     injections, although I do not have any details on which injections     were performed, would like to get this information.  In terms of     medication management, given her history of migraines plus her     chronic pain syndrome and mild sleep disturbance, we will start her     on nortriptyline 10 mg at bedtime, can workup on this over time if     need be.  If this is not helpful for her or if she has some side     effects that she cannot tolerate, we will go back to some Lyrica     for her. 2. In terms of her narcotic analgesics, certainly does not seem to be     over using.  We will have a urine drug screen and if negative for     illicit drugs or non prescribed opiates, would be able to assume     this prescription.  Discussed with the patient, agrees with plan.  Erick Colace, M.D.    AEK/MedQ D:05/27/2010 17:31:30  T:05/28/2010 01:51:39  Job #:  [id number]  Electronically Signed by Claudette Laws M.D. on 06/12/2010 09:30:45 AM

## 2010-06-17 ENCOUNTER — Encounter: Payer: BC Managed Care – PPO | Attending: Physical Medicine & Rehabilitation

## 2010-06-17 ENCOUNTER — Ambulatory Visit: Payer: BC Managed Care – PPO | Admitting: Physical Medicine & Rehabilitation

## 2010-06-17 DIAGNOSIS — Z79899 Other long term (current) drug therapy: Secondary | ICD-10-CM | POA: Insufficient documentation

## 2010-06-17 DIAGNOSIS — M961 Postlaminectomy syndrome, not elsewhere classified: Secondary | ICD-10-CM | POA: Insufficient documentation

## 2010-06-17 DIAGNOSIS — G8928 Other chronic postprocedural pain: Secondary | ICD-10-CM | POA: Insufficient documentation

## 2010-06-17 NOTE — Assessment & Plan Note (Signed)
A 30 year old female with history of back pain.  She has undergone L5-S1 diskectomy in November of 2009, has chronic postoperative pain.  She has been trialed on some epidural injections, but she does not remember what type.  She has been well maintained overall on Norco.  She works 30 hours a week on average.  She can walk 30 minutes at a time.  She takes care of her 30-year-old and 56 year old child.  Her pain is described as sharp burning, dull, stabbing, aching, rated as 7 on average, but with activity only interfering at a moderate level.  Relief from meds is good.  She has depression and anxiety on a review of systems, but this is mild.  She has no significant pain going down the leg other than to her right posterior thigh.  PHYSICAL EXAMINATION:  VITAL SIGNS:  Blood pressure 120/61, pulse 81, respirations 18, O2 sat 98% on room air. GENERAL:  Obese female in no acute stress.  Orientation x3.  Affect alert.  Gait is normal. EXTREMITIES:  Without edema. MUSCULOSKELETAL:  She has normal strength in bilateral lower extremity. Normal deep tendon reflex in bilateral lower extremity.  Her lumbar spine range of motion is limited about 50% forward flexion and extension.  IMPRESSION:  Lumbar post laminectomy syndrome, well-maintained very functional on her current medications of hydrocodone 10/325 q.i.d.  I will see her back in 3 months.  We will ask Dr. Ethelene Hal to send [place] some records in terms of what type of injections he has trialed her on. Discussed with patient agrees with plan.     Erick Colace, M.D. Electronically Signed    AEK/MedQ D:  06/17/2010 11:11:20  T:  06/17/2010 23:31:28  Job #:  [id number]  cc:   Corwin Levins, MD [address] [place] [address]  Caralyn Guile. Ethelene Hal, M.D. Fax: [phone]

## 2010-06-25 ENCOUNTER — Other Ambulatory Visit: Payer: Self-pay | Admitting: *Deleted

## 2010-06-25 MED ORDER — AMPHETAMINE-DEXTROAMPHETAMINE 20 MG PO TABS: 20.0000 mg | ORAL_TABLET | Freq: Two times a day (BID) | ORAL | Status: DC

## 2010-06-25 NOTE — Telephone Encounter (Signed)
Pt requesting refill of Adderall, last OV 03/29/2010, last written 05/13/2010 #[phone] x 0

## 2010-06-26 NOTE — Telephone Encounter (Signed)
Pt informed. Rx placed upfront in cabinet ready for pickup.  

## 2010-07-10 ENCOUNTER — Other Ambulatory Visit: Payer: Self-pay | Admitting: Physical Medicine & Rehabilitation

## 2010-07-10 ENCOUNTER — Encounter: Payer: BC Managed Care – PPO | Attending: Neurosurgery | Admitting: Neurosurgery

## 2010-07-10 ENCOUNTER — Ambulatory Visit (HOSPITAL_COMMUNITY)
Admission: RE | Admit: 2010-07-10 | Discharge: 2010-07-10 | Disposition: A | Discharge status: Home / Self Care | Payer: BC Managed Care – PPO | Source: Ambulatory Visit | Attending: Physical Medicine & Rehabilitation | Admitting: Physical Medicine & Rehabilitation

## 2010-07-10 DIAGNOSIS — M545 Low back pain, unspecified: Secondary | ICD-10-CM | POA: Insufficient documentation

## 2010-07-10 DIAGNOSIS — Z79899 Other long term (current) drug therapy: Secondary | ICD-10-CM | POA: Insufficient documentation

## 2010-07-10 DIAGNOSIS — IMO0002 Reserved for concepts with insufficient information to code with codable children: Secondary | ICD-10-CM | POA: Insufficient documentation

## 2010-07-10 DIAGNOSIS — M79609 Pain in unspecified limb: Secondary | ICD-10-CM | POA: Insufficient documentation

## 2010-07-10 DIAGNOSIS — M5430 Sciatica, unspecified side: Secondary | ICD-10-CM

## 2010-07-10 DIAGNOSIS — M5126 Other intervertebral disc displacement, lumbar region: Secondary | ICD-10-CM | POA: Insufficient documentation

## 2010-07-10 DIAGNOSIS — R269 Unspecified abnormalities of gait and mobility: Secondary | ICD-10-CM | POA: Insufficient documentation

## 2010-07-10 DIAGNOSIS — N854 Malposition of uterus: Secondary | ICD-10-CM | POA: Insufficient documentation

## 2010-07-10 MED ORDER — GADOBENATE DIMEGLUMINE 529 MG/ML IV SOLN
20.0000 mL | Freq: Once | INTRAVENOUS | Status: AC | PRN
  Administered 2010-07-10: 20 mL via INTRAVENOUS

## 2010-07-11 NOTE — Assessment & Plan Note (Signed)
Account [id number].  Ms. Tina [name] is seen by Dr. Wynn Banker on June 17, 2010.  She is fairly new patient to the practice and she had been having some low back pain and right-sided radicular pain.  The patient comes in today stating that the pain is elevated.  She called about a week ago, and he prescribed some Naprosyn and Flexeril for the patient.  She states it has not gotten any better.  If anything it has gotten worse, and today she is in extreme pain with her right leg.  She rates her pain today at 10.  It is a sharp stabbing, burning pain that is constant.  General activity level is about 9 or 10.  The patin is same 24 hours a day.  Sleep is poor now. All activities aggravate.  She is getting very little relief from her medicine.  She is taking Norco.  REVIEW OF SYSTEMS:  Notable for the difficulties described above.  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  PHYSICAL EXAMINATION:  VITAL SIGNS:  Blood pressure to be 125/79, pulse 107, respirations 12, O2 sats 99 on room air.  She has a wildly positive straight leg raise on the right.  She is point tender in the right buttock and has severe limp due to the pain.  Her strength is diminished.  She has gotten 4/5 in the iliopsoas on that side.  She gives away to pain.  She did undergo surgery with Dr. Shelle Iron in November 2009 for diskectomy.  Constitutionally, she is somewhat obese.  She is alert and oriented x3.  She is somewhat irritable due to the pain but otherwise very pleasant.  ASSESSMENT: 1. Question herniated nucleus pulposus recurrent possibly at the same     level.  Dr. Shelle Iron operated. 2. Low back pain that is acute on chronic with advancing radiculopathy     of the right lower extremity.  PLAN:  I am going to go ahead and refill her Norco 10/325 one p.o. q.i.d. 120 with no refill, and I gave her Sterapred 10 mg, 6-day Dosepak.  We are going  to order stat MRI of the lumbar spine with and without Gadolinium.  She is going to have that done at 8 o'clock tonight at [facility] and we will call the patient tomorrow with the results. Her questions were encouraged and answered.     Gladine Plude L. Blima Dessert Electronically Signed    RLW/MedQ D:  07/10/2010 15:10:14  T:  07/11/2010 04:19:16  Job #:  [id number]

## 2010-08-27 ENCOUNTER — Other Ambulatory Visit: Payer: Self-pay

## 2010-08-27 MED ORDER — AMPHETAMINE-DEXTROAMPHETAMINE 20 MG PO TABS: 20.0000 mg | ORAL_TABLET | Freq: Two times a day (BID) | ORAL | Status: DC

## 2010-08-28 NOTE — Telephone Encounter (Signed)
Pt informed, Rx in cabinet for pt pick up  

## 2010-09-13 ENCOUNTER — Encounter: Payer: BC Managed Care – PPO | Attending: Physical Medicine & Rehabilitation

## 2010-09-13 ENCOUNTER — Ambulatory Visit: Payer: BC Managed Care – PPO | Admitting: Physical Medicine & Rehabilitation

## 2010-09-13 DIAGNOSIS — Z79899 Other long term (current) drug therapy: Secondary | ICD-10-CM | POA: Insufficient documentation

## 2010-09-13 DIAGNOSIS — G8928 Other chronic postprocedural pain: Secondary | ICD-10-CM | POA: Insufficient documentation

## 2010-09-13 DIAGNOSIS — M47817 Spondylosis without myelopathy or radiculopathy, lumbosacral region: Secondary | ICD-10-CM

## 2010-09-13 DIAGNOSIS — M961 Postlaminectomy syndrome, not elsewhere classified: Secondary | ICD-10-CM | POA: Insufficient documentation

## 2010-09-17 NOTE — Assessment & Plan Note (Signed)
CHIEF COMPLAINT:  Right-sided low back pain.  HISTORY:  A 30 year old female with right lower extremity pain goes down the back or thigh, proximal leg, right side only, really occasionally has pain below the knee.  She has been seen by Dr. Shelle Iron from Orthopedics, underwent some injections by Dr. Ethelene Hal.  She has had a prior lumbar decompression, foraminotomy, and microdiskectomy at L5-S1. She had an initial diagnosis of chronic L4 radiculopathy as she is sleeping better on nortriptyline although wonders whether we could increase the dose on that.  She tried some Flexeril, not helpful for back pain or spasms.  She continues on hydrocodone 10/325 q.i.d.  Oswestry score initially 62%, currently rated as a 58%.  She underwent repeat MRI scan.  She is here to follow up on this.  I did review with her using a spine model to compare or to illustrate.  At L3-4 she has a central and leftward protrusion, moderate facet arthropathy, positive left L4 nerve root encroachment and L4-5 events facet arthropathy.  No disk protrusion at L5-S1, some neural foraminal narrowing L5 nerve root potentially but no recurrent protrusion.  Straight leg raising test is mildly positive on the right, but really just causing pain into the buttock area.  Deep tendon reflexes are normal.  Sensation is normal.  Strength is normal lower extremity within pain limits on the right side.  Back has some tenderness at the L4-5 area on the right.  She is able to bend forward, backward, side-to-side but only about 50% range.  IMPRESSION: 1. Lumbar pain with right lower extremity pain.  At this point, I am     not convinced as radicular certainly no numbness or tingling.  And     is mainly confined to the thigh.  I suspect this may be radiating     pain from facet arthropathy which is noted at L3-4, L4-5.  We will     set her up for injections at L2-3, L4 medial branches on the right     side. 2. We will bump up her  nortriptyline 25 nightly, continue hydrocodone     10/325 q.i.d. 3. Change from Flexeril to Robaxin 500 mg t.i.d. p.r.n.  Discussed     with the patient and agrees with plan.     Erick Colace, M.D. Electronically Signed    AEK/MedQ D:  09/13/2010 14:17:03  T:  09/13/2010 15:12:20  Job #:  [id number]

## 2010-09-23 ENCOUNTER — Encounter: Payer: BC Managed Care – PPO | Admitting: Physical Medicine & Rehabilitation

## 2010-09-30 ENCOUNTER — Encounter: Payer: BC Managed Care – PPO | Admitting: Physical Medicine & Rehabilitation

## 2010-10-16 LAB — CBC
Hemoglobin: 12.8
MCHC: 33
RBC: 5.13 — ABNORMAL HIGH
RDW: 15.7 — ABNORMAL HIGH

## 2010-10-16 LAB — COMPREHENSIVE METABOLIC PANEL
ALT: 10
AST: 14
Alkaline Phosphatase: 54
Calcium: 9
GFR calc Af Amer: >60
Glucose, Bld: 111 — ABNORMAL HIGH
Potassium: 4.2
Sodium: 140
Total Protein: 6.4

## 2010-10-16 LAB — URINALYSIS, ROUTINE W REFLEX MICROSCOPIC
Leukocytes, UA: NEGATIVE
Specific Gravity, Urine: 1.02
Urobilinogen, UA: 0.2

## 2010-10-16 LAB — URINE MICROSCOPIC-ADD ON

## 2010-10-16 LAB — APTT: aPTT: 32

## 2010-10-22 ENCOUNTER — Ambulatory Visit (INDEPENDENT_AMBULATORY_CARE_PROVIDER_SITE_OTHER): Payer: BC Managed Care – PPO | Admitting: Internal Medicine

## 2010-10-22 ENCOUNTER — Encounter: Payer: Self-pay | Admitting: Internal Medicine

## 2010-10-22 VITALS — BP 110/70 | HR 83 | Temp 97.9°F | Ht 67.0 in | Wt 260.2 lb

## 2010-10-22 DIAGNOSIS — F988 Other specified behavioral and emotional disorders with onset usually occurring in childhood and adolescence: Secondary | ICD-10-CM

## 2010-10-22 DIAGNOSIS — R300 Dysuria: Secondary | ICD-10-CM

## 2010-10-22 DIAGNOSIS — Z23 Encounter for immunization: Secondary | ICD-10-CM

## 2010-10-22 DIAGNOSIS — R21 Rash and other nonspecific skin eruption: Secondary | ICD-10-CM

## 2010-10-22 DIAGNOSIS — N390 Urinary tract infection, site not specified: Secondary | ICD-10-CM

## 2010-10-22 DIAGNOSIS — F411 Generalized anxiety disorder: Secondary | ICD-10-CM

## 2010-10-22 DIAGNOSIS — Z0000 Encounter for general adult medical examination without abnormal findings: Secondary | ICD-10-CM

## 2010-10-22 LAB — POCT URINALYSIS DIPSTICK
Ketones, UA: NEGATIVE
Protein, UA: NEGATIVE
Spec Grav, UA: 1.015
pH, UA: 5

## 2010-10-22 MED ORDER — SULFAMETHOXAZOLE-TRIMETHOPRIM 800-160 MG PO TABS: ORAL_TABLET | ORAL | Status: DC

## 2010-10-22 MED ORDER — SULFAMETHOXAZOLE-TRIMETHOPRIM 800-160 MG PO TABS: 1.0000 | ORAL_TABLET | Freq: Two times a day (BID) | ORAL | Status: DC

## 2010-10-22 MED ORDER — AMPHETAMINE-DEXTROAMPHETAMINE 20 MG PO TABS: 20.0000 mg | ORAL_TABLET | Freq: Two times a day (BID) | ORAL | Status: DC

## 2010-10-22 NOTE — Patient Instructions (Signed)
Take all new medications as prescribed Remember, you have 2 prescriptions for the septra antibiotic - one for the full treatment for the infection now, and one for "as needed" use Continue all other medications as before - you are given the next 3 prescriptions for the adderall Please return in 6 mo with Lab testing done 3-5 days before

## 2010-10-25 ENCOUNTER — Telehealth: Payer: Self-pay

## 2010-10-25 MED ORDER — HYDROCORTISONE 1 % EX CREA: TOPICAL_CREAM | Freq: Two times a day (BID) | CUTANEOUS | Status: DC

## 2010-10-25 NOTE — Telephone Encounter (Signed)
Called patient informed prescription requested has been filled and faxed hardcopy to [facility] as requested by patient.

## 2010-10-25 NOTE — Telephone Encounter (Signed)
Done hardcopy to [facility]  

## 2010-10-25 NOTE — Telephone Encounter (Signed)
Pt called stating that she was advised at OV that a cream would be sent to her pharmacy. Per pt pharmacy has not received Rx, please advise.

## 2010-10-27 ENCOUNTER — Encounter: Payer: Self-pay | Admitting: Internal Medicine

## 2010-10-27 DIAGNOSIS — R21 Rash and other nonspecific skin eruption: Secondary | ICD-10-CM | POA: Insufficient documentation

## 2010-10-27 NOTE — Assessment & Plan Note (Signed)
Mild to mod, for antibx course,  to f/u any worsening symptoms or concerns;  Also for separate rx for for prophylaxis to use 1 po prn intercourse

## 2010-10-27 NOTE — Assessment & Plan Note (Signed)
stable overall by hx and exam, and pt to continue medical treatment as before 

## 2010-10-27 NOTE — Progress Notes (Signed)
Subjective:    Patient ID: Tina [name], female    DOB: January 13, 1981, 30 y.o.   MRN: [id number]  HPI  Here to c/o 2-3 days onset dysuria, with mild freq and urgency, without n/v, back pain, high fever, chills but does have mild general weakness and malaise.  Not pregnant, currently on menses.  Has had mult UTIs  In the past yr with several antibx courses, no hx of urologic anatomic d/o, DM or other immune suppression.  Has frequent intercourse as a signficant risk factor   Denies worsening depressive symptoms, suicidal ideation, or panic, though has ongoing anxiety, not increased recently.  ADD med dosing working well for work and social functioning.  Needs flu shot today. Also with several small red areas to th left thigh and knee, with minor itch, not vesicular, pustular or painful, just not sure how it happened Past Medical History  Diagnosis Date  . LUMBAR RADICULOPATHY, RIGHT 09/20/2009  . HYPERLIPIDEMIA 04/26/2009  . ANXIETY 02/01/2009  . DEPRESSION 02/01/2009  . ADD 04/26/2009  . COMMON MIGRAINE 02/01/2009  . GERD 02/01/2009  . 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 Pruritus ani 04/26/2009  . Unspecified backache 02/01/2009  . HYPERSOMNIA 02/08/2010  . SMOKER 03/29/2010  . Urinary frequency 04/25/2010   Past Surgical History  Procedure Date  . Cesarean section   . Tonsillectomy     reports that she has been smoking.  She does not have any smokeless tobacco history on file. She reports that she does not drink alcohol or use illicit drugs. family history includes Cancer in her others and paternal grandmother; Depression in her mother; Diabetes in her father and maternal grandmother; Hypertension in her father and maternal grandmother; and Kidney failure in her other. No Known Allergies Current Outpatient Prescriptions on File Prior to Visit  Medication Sig Dispense Refill  . ALPRAZolam (XANAX) 0.5 MG tablet Take 0.5 mg by mouth 2 (two) times daily.        . cyclobenzaprine (FLEXERIL) 5 MG tablet Take  5 mg by mouth 3 (three) times daily as needed.        Marland Kitchen HYDROcodone-acetaminophen (NORCO) 10-325 MG per tablet Take 1 tablet by mouth 2 (two) times daily as needed.  60 tablet  1  . omeprazole (PRILOSEC) 20 MG capsule Take 20 mg by mouth daily.        . SUMAtriptan (IMITREX) 100 MG tablet Take 100 mg by mouth daily.        . varenicline (CHANTIX) 1 MG tablet Take 1 mg by mouth 2 (two) times daily.        Marland Kitchen FLUoxetine (PROZAC) 40 MG capsule Take 40 mg by mouth daily.         Review of Systems Review of Systems  Constitutional: Negative for diaphoresis and unexpected weight change.  HENT: Negative for drooling and tinnitus.   Eyes: Negative for photophobia and visual disturbance.  Respiratory: Negative for choking and stridor.   Gastrointestinal: Negative for vomiting and blood in stool.  Genitourinary: Negative for hematuria and decreased urine volume.       Objective:   Physical Exam BP 110/70  Pulse 83  Temp(Src) 97.9 F (36.6 C) (Oral)  Ht 5\' 7"  (1.702 m)  Wt 260 lb 4 oz (118.049 kg)  BMI 40.76 kg/m2  SpO2 99%  LMP 10/20/2010 Physical Exam  VS noted, obese, mild ill Constitutional: Pt appears well-developed and well-nourished.  HENT: Head: Normocephalic.  Right Ear: External ear normal.  Left Ear: External ear normal.  Eyes: Conjunctivae and EOM are normal. Pupils are equal, round, and reactive to light.  Neck: Normal range of motion. Neck supple.  Cardiovascular: Normal rate and regular rhythm.   Pulmonary/Chest: Effort normal and breath sounds normal.  Abd:  Soft, NT except for mild low mid abd wihtout guarding or rebound, non-distended, + BS, no flank tender Neurological: Pt is alert. No cranial nerve deficit.  Skin: Skin is warm. No erythema. except for about 10 small < 3 mm area left ant thigh and knee, nonvesicular/nonpustular, nontender Psychiatric: Pt behavior is normal. Thought content normal. 1+ nervous    Assessment & Plan:

## 2010-10-27 NOTE — Assessment & Plan Note (Signed)
Minor nonspecific, can use cortaid otc,  to f/u any worsening symptoms or concerns

## 2010-10-27 NOTE — Assessment & Plan Note (Signed)
stable overall by hx and exam, most recent data reviewed with pt, and pt to continue medical treatment as before Lab Results  Component Value Date   WBC 12.4* 02/08/2010   HGB 13.2 02/08/2010   HCT 39.7 02/08/2010   PLT 287.0 02/08/2010   GLUCOSE 81 02/08/2010   CHOL 221* 02/08/2010   TRIG 218.0* 02/08/2010   HDL 46.80 02/08/2010   LDLDIRECT 149.6 02/08/2010   ALT 16 02/08/2010   AST 17 02/08/2010   NA 140 02/08/2010   K 4.1 02/08/2010   CL 106 02/08/2010   CREATININE 0.6 02/08/2010   BUN 8 02/08/2010   CO2 28 02/08/2010   TSH 0.96 02/08/2010   INR 0.9 11/17/2007

## 2010-10-30 ENCOUNTER — Encounter: Payer: Self-pay | Admitting: Endocrinology

## 2010-10-30 ENCOUNTER — Ambulatory Visit (INDEPENDENT_AMBULATORY_CARE_PROVIDER_SITE_OTHER): Payer: BC Managed Care – PPO | Admitting: Endocrinology

## 2010-10-30 DIAGNOSIS — R42 Dizziness and giddiness: Secondary | ICD-10-CM

## 2010-10-30 MED ORDER — MECLIZINE HCL 12.5 MG PO TABS: 12.5000 mg | ORAL_TABLET | Freq: Three times a day (TID) | ORAL | Status: AC | PRN

## 2010-10-30 NOTE — Patient Instructions (Signed)
i have sent a prescription to your pharmacy, to take as needed for your symptoms. I hope you feel better soon.  If you don't feel better by next week, please call dr Jonny Ruiz.

## 2010-10-30 NOTE — Progress Notes (Signed)
Subjective:    Patient ID: Tina [name], female    DOB: 11/10/1980, 30 y.o.   MRN: [id number]  HPI Pt states 1 week of moderate non-vertigenous-quality dizziness.  She has assoc nausea, but no vomiting.  Pt says she had similar sxs a few years ago.  She was seen in er, where she was told it was an inner ear problem.   Past Medical History  Diagnosis Date  . LUMBAR RADICULOPATHY, RIGHT 09/20/2009  . HYPERLIPIDEMIA 04/26/2009  . ANXIETY 02/01/2009  . DEPRESSION 02/01/2009  . ADD 04/26/2009  . COMMON MIGRAINE 02/01/2009  . GERD 02/01/2009  . 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 Pruritus ani 04/26/2009  . Unspecified backache 02/01/2009  . HYPERSOMNIA 02/08/2010  . SMOKER 03/29/2010  . Urinary frequency 04/25/2010    Past Surgical History  Procedure Date  . Cesarean section   . Tonsillectomy     History   Social History  . Marital Status: Single    Spouse Name: N/A    Number of Children: 1  . Years of Education: N/A   Occupational History  . [employer]  . part time student [employer]    Social History Main Topics  . Smoking status: Current Everyday Smoker  . Smokeless tobacco: Not on file  . Alcohol Use: No  . Drug Use: No  . Sexually Active: Not on file   Other Topics Concern  . Not on file   Social History Narrative   Also raises 9 yo nephew    Current Outpatient Prescriptions on File Prior to Visit  Medication Sig Dispense Refill  . ALPRAZolam (XANAX) 0.5 MG tablet Take 0.5 mg by mouth 2 (two) times daily.        Marland Kitchen amphetamine-dextroamphetamine (ADDERALL) 20 MG tablet Take 1 tablet (20 mg total) by mouth 2 (two) times daily. To fill Dec 21, 2010  60 tablet  0  . cyclobenzaprine (FLEXERIL) 5 MG tablet Take 5 mg by mouth 3 (three) times daily as needed.        Marland Kitchen FLUoxetine (PROZAC) 40 MG capsule Take 40 mg by mouth daily.        Marland Kitchen HYDROcodone-acetaminophen (NORCO) 10-325 MG per tablet Take 1 tablet by mouth 2 (two) times daily as needed.  60 tablet  1  . hydrocortisone 1 %  cream Apply topically 2 (two) times daily.  30 g  1  . omeprazole (PRILOSEC) 20 MG capsule Take 20 mg by mouth daily.        Marland Kitchen sulfamethoxazole-trimethoprim (SEPTRA DS) 800-160 MG per tablet 1 tab by mouth as needed after intercourse  30 tablet  2  . SUMAtriptan (IMITREX) 100 MG tablet Take 100 mg by mouth daily.        . varenicline (CHANTIX) 1 MG tablet Take 1 mg by mouth 2 (two) times daily.          No Known Allergies  Family History  Problem Relation Age of Onset  . Depression Mother   . Diabetes Father   . Hypertension Father   . Diabetes Maternal Grandmother     Dialysis  . Hypertension Maternal Grandmother   . Cancer Paternal Grandmother     Breast Cancer  . Cancer Other     colon  . Kidney failure Other     dialysis  . Cancer Other     colon    BP 122/72  Pulse 82  Temp(Src) 98.2 F (36.8 C) (Oral)  Ht 5\' 7"  (1.702 m)  Wt 260 lb  8 oz (118.162 kg)  BMI 40.80 kg/m2  SpO2 99%  LMP 10/20/2010  Review of Systems Denies fever and loc    Objective:   Physical Exam VITAL SIGNS:  See vs page GENERAL: no distress.  Obese head: no deformity eyes: no periorbital swelling, no proptosis external nose and ears are normal mouth: no lesion seen Both eac's and tm's are normal NECK: There is no palpable thyroid enlargement.  No thyroid nodule is palpable.  No palpable lymphadenopathy at the anterior neck. LUNGS:  Clear to auscultation HEART:Regular rate and rhythm without murmurs noted. Normal S1,S2.        Assessment & Plan:  Dizziness, uncertain etiology.  This has an extensive ddx

## 2010-11-02 DIAGNOSIS — R42 Dizziness and giddiness: Secondary | ICD-10-CM | POA: Insufficient documentation

## 2010-11-18 ENCOUNTER — Ambulatory Visit: Payer: BC Managed Care – PPO | Admitting: Physical Medicine & Rehabilitation

## 2010-11-18 ENCOUNTER — Encounter: Payer: BC Managed Care – PPO | Attending: Physical Medicine & Rehabilitation

## 2010-11-18 DIAGNOSIS — Z79899 Other long term (current) drug therapy: Secondary | ICD-10-CM | POA: Insufficient documentation

## 2010-11-18 DIAGNOSIS — M47817 Spondylosis without myelopathy or radiculopathy, lumbosacral region: Secondary | ICD-10-CM

## 2010-11-18 DIAGNOSIS — M961 Postlaminectomy syndrome, not elsewhere classified: Secondary | ICD-10-CM

## 2010-11-18 DIAGNOSIS — G8928 Other chronic postprocedural pain: Secondary | ICD-10-CM | POA: Insufficient documentation

## 2010-11-18 NOTE — Assessment & Plan Note (Signed)
A 30 year old female with lumbar post laminectomy syndrome.  She has chronic low back pain, diskectomy was done in 2009 at L5-S1 level.  She has been trialed on epidural injections.  She has been maintained on Norco allowing her to walk and work full time as an Airline pilot at [employer].  She was to get medial branch blocks, however, her insurance would not approve.  She states that she is going to get a different type of insurance on January 13, 2010, through her employer.  Her current pain is 6/10, gets up to 9 at times.  Her pain is worse with walking, bending, and standing; rest sometimes helps, medications helps to a fair degree.  She can walk 20-30 minutes at a time.  She climbs steps.  She drives.  REVIEW OF SYSTEMS:  Bladder control problems, chronic depression, anxiety, nausea.  PHYSICAL EXAMINATION:  VITAL SIGNS:  Blood pressure 113/71, pulse is 87, respirations 18, O2 sat 98% on room air,  weight 261 pounds, height 5 feet 7 inches. GENERAL:  Obese female, in no acute distress.  Orientation x3.  Affect alert.  Gait is normal. MUSCULOSKELETAL:  She has negative straight leg raising test.  She has decreased spine range of motion, both forward flexion and extension. She has full range of motion in the hips, knees, and ankles.  Normal strength in lower extremities.  IMPRESSION: 1. Lumbar post laminectomy syndrome.  Continue Norco 10/325 q.i.d. 2. Lumbar spondylosis.  Given that her pain does limit her activities     despite medication management, I think she may be a good candidate     for lumbar medial branch blocks to how much of her back pain is     related to her facet joints.  Discussed with patient, agrees with plan.  She will see Kallie Edward in 1 month and see me in 2 months presumably for the injections pending insurance approval.     Erick Colace, M.D. Electronically Signed    AEK/MedQ D:  11/18/2010 16:13:22  T:  11/18/2010 20:00:28  Job #:   [id number]  cc:   Corwin Levins, MD [address] [place] [address]

## 2010-11-20 ENCOUNTER — Encounter: Payer: Self-pay | Admitting: Internal Medicine

## 2010-11-20 ENCOUNTER — Ambulatory Visit (INDEPENDENT_AMBULATORY_CARE_PROVIDER_SITE_OTHER): Payer: BC Managed Care – PPO | Admitting: Internal Medicine

## 2010-11-20 ENCOUNTER — Other Ambulatory Visit (INDEPENDENT_AMBULATORY_CARE_PROVIDER_SITE_OTHER): Payer: BC Managed Care – PPO

## 2010-11-20 VITALS — BP 98/72 | HR 90 | Temp 97.7°F

## 2010-11-20 DIAGNOSIS — R110 Nausea: Secondary | ICD-10-CM

## 2010-11-20 DIAGNOSIS — R42 Dizziness and giddiness: Secondary | ICD-10-CM

## 2010-11-20 DIAGNOSIS — R0989 Other specified symptoms and signs involving the circulatory and respiratory systems: Secondary | ICD-10-CM

## 2010-11-20 DIAGNOSIS — R0600 Dyspnea, unspecified: Secondary | ICD-10-CM

## 2010-11-20 DIAGNOSIS — I959 Hypotension, unspecified: Secondary | ICD-10-CM

## 2010-11-20 DIAGNOSIS — R0609 Other forms of dyspnea: Secondary | ICD-10-CM

## 2010-11-20 LAB — CBC WITH DIFFERENTIAL/PLATELET
Basophils Absolute: 0 10*3/uL (ref 0.0–0.1)
Basophils Relative: 0.8 % (ref 0.0–3.0)
Eosinophils Absolute: 0 10*3/uL (ref 0.0–0.7)
Eosinophils Relative: 0.2 % (ref 0.0–5.0)
HCT: 39.2 % (ref 36.0–46.0)
Hemoglobin: 13 g/dL (ref 12.0–15.0)
Lymphocytes Relative: 36 % (ref 12.0–46.0)
Lymphs Abs: 1.9 10*3/uL (ref 0.7–4.0)
MCHC: 33.3 g/dL (ref 30.0–36.0)
MCV: 78.7 fl (ref 78.0–100.0)
Monocytes Absolute: 0.4 10*3/uL (ref 0.1–1.0)
Monocytes Relative: 7.7 % (ref 3.0–12.0)
Neutro Abs: 2.9 10*3/uL (ref 1.4–7.7)
Neutrophils Relative %: 55.3 % (ref 43.0–77.0)
Platelets: 240 10*3/uL (ref 150.0–400.0)
RBC: 4.97 Mil/uL (ref 3.87–5.11)
RDW: 15.6 % — ABNORMAL HIGH (ref 11.5–14.6)
WBC: 5.2 10*3/uL (ref 4.5–10.5)

## 2010-11-20 LAB — BASIC METABOLIC PANEL
BUN: 11 mg/dL (ref 6–23)
CO2: 31 mEq/L (ref 19–32)
Calcium: 9.2 mg/dL (ref 8.4–10.5)
Chloride: 103 mEq/L (ref 96–112)
Creatinine, Ser: 0.6 mg/dL (ref 0.4–1.2)
GFR: 133.14 mL/min (ref >60.00)
Glucose, Bld: 77 mg/dL (ref 70–99)
Potassium: 4.3 mEq/L (ref 3.5–5.1)
Sodium: 140 mEq/L (ref 135–145)

## 2010-11-20 LAB — HEPATIC FUNCTION PANEL
ALT: 15 U/L (ref 0–35)
AST: 15 U/L (ref 0–37)
Albumin: 4.1 g/dL (ref 3.5–5.2)
Alkaline Phosphatase: 53 U/L (ref 39–117)
Bilirubin, Direct: 0 mg/dL (ref 0.0–0.3)
Total Protein: 7.4 g/dL (ref 6.0–8.3)

## 2010-11-20 NOTE — Progress Notes (Signed)
  Subjective:    Patient ID: Tina [name], female    DOB: 1980/10/04, 30 y.o.   MRN: [id number]  HPI  Complains of shortness of breath Describes as moderately severe symptoms this a.m., currently much improved. Onset 5 AM while driving to work today. Describes as a sudden pressure feeling lower chest & epigastric region causing the feeling of being unable to breathe. Denies cough, pleurisy, chest pain or palpitations Symptoms lasted approximately 5 minutes before spontaneously resolve. Currently feels well but notes URI symptoms approximately 2 weeks ago. Also associated with ongoing nausea for 2 weeks but no abdominal pain or vomiting. Also lightheaded feeling during the dyspnea spell, but no syncope or headache. No swelling of legs, no recent medicine changes or travel. Intermittent history of same dyspnea symptoms over past 1 month but usually less severe than today  Past Medical History  Diagnosis Date  . LUMBAR RADICULOPATHY, RIGHT   . HYPERLIPIDEMIA   . ANXIETY   . DEPRESSION   . ADD   . COMMON MIGRAINE   . GERD   . CHOLELITHIASIS   . Pruritus ani   . Unspecified backache   . HYPERSOMNIA   . SMOKER     Review of Systems See history of present illness above; otherwise negative    Objective:   Physical Exam BP 98/72  Pulse 90  Temp(Src) 97.7 F (36.5 C) (Oral)  SpO2 99%  LMP 10/20/2010 Wt Readings from Last 3 Encounters:  10/30/10 260 lb 8 oz (118.162 kg)  10/22/10 260 lb 4 oz (118.049 kg)  03/29/10 265 lb 4 oz (120.317 kg)   Constitutional: She is overweight -appears well-developed and well-nourished. Mild dyspnea at rest HENT: Head: Normocephalic and atraumatic. Ears: B TMs ok, no erythema or effusion; Nose: Nose normal.  Mouth/Throat: Oropharynx is clear and moist. No oropharyngeal exudate.  Eyes: Conjunctivae and EOM are normal. Pupils are equal, round, and reactive to light. No scleral icterus.  Neck: Thick; Normal range of motion. Neck supple. No JVD  present. No thyromegaly present.  Cardiovascular: Normal rate, regular rhythm and normal heart sounds.  No murmur heard. No BLE edema. Pulmonary/Chest: Effort normal and breath sounds normal. No respiratory distress. She has no wheezes. Psychiatric: She has a slightly anxious mood and affect. Her behavior is normal. Judgment and thought content normal.   BP Readings from Last 3 Encounters:  11/20/10 98/72  10/30/10 122/72  10/22/10 110/70     Lab Results  Component Value Date   WBC 12.4* 02/08/2010   HGB 13.2 02/08/2010   HCT 39.7 02/08/2010   PLT 287.0 02/08/2010   GLUCOSE 81 02/08/2010   CHOL 221* 02/08/2010   TRIG 218.0* 02/08/2010   HDL 46.80 02/08/2010   LDLDIRECT 149.6 02/08/2010   ALT 16 02/08/2010   AST 17 02/08/2010   NA 140 02/08/2010   K 4.1 02/08/2010   CL 106 02/08/2010   CREATININE 0.6 02/08/2010   BUN 8 02/08/2010   CO2 28 02/08/2010   TSH 0.96 02/08/2010   INR 0.9 11/17/2007       Assessment & Plan:  Paroxysmal dyspnea - associated with nausea, lightheadedness and mild hypotension. Exam generally benign noting low normal blood pressure today - Check labs today including d-dimer  plan CT chest unless other lab abnormality identified as cause of symptoms Consider panic attack as cause of symptoms, but need to exclude other medical abnormalities first

## 2010-11-20 NOTE — Patient Instructions (Signed)
It was good to see you today. Test(s) ordered today. Your results will be called to you after review (48-72hours after test completion). If any changes need to be made, you will be notified at that time. If labs unremarkable, will plan CT chest as discussed If you develop worsening symptoms or fever, call and we can reconsider antibiotics, but it does not appear necessary to use antibiotics at this time.

## 2010-11-21 ENCOUNTER — Other Ambulatory Visit: Payer: Self-pay | Admitting: Internal Medicine

## 2010-11-21 DIAGNOSIS — R0600 Dyspnea, unspecified: Secondary | ICD-10-CM

## 2010-11-21 LAB — D-DIMER, QUANTITATIVE: D-Dimer, Quant: 0.61 ug/mL-FEU — ABNORMAL HIGH (ref 0.00–0.48)

## 2010-12-17 ENCOUNTER — Encounter: Payer: BC Managed Care – PPO | Attending: Neurosurgery | Admitting: Neurosurgery

## 2010-12-17 DIAGNOSIS — M961 Postlaminectomy syndrome, not elsewhere classified: Secondary | ICD-10-CM

## 2010-12-17 DIAGNOSIS — G894 Chronic pain syndrome: Secondary | ICD-10-CM

## 2010-12-17 DIAGNOSIS — M47817 Spondylosis without myelopathy or radiculopathy, lumbosacral region: Secondary | ICD-10-CM | POA: Insufficient documentation

## 2010-12-17 DIAGNOSIS — M545 Low back pain, unspecified: Secondary | ICD-10-CM | POA: Insufficient documentation

## 2011-01-03 ENCOUNTER — Ambulatory Visit (INDEPENDENT_AMBULATORY_CARE_PROVIDER_SITE_OTHER): Payer: BC Managed Care – PPO | Admitting: Internal Medicine

## 2011-01-03 ENCOUNTER — Encounter: Payer: Self-pay | Admitting: Internal Medicine

## 2011-01-03 VITALS — BP 118/74 | HR 104 | Temp 98.1°F | Wt 262.0 lb

## 2011-01-03 DIAGNOSIS — F988 Other specified behavioral and emotional disorders with onset usually occurring in childhood and adolescence: Secondary | ICD-10-CM

## 2011-01-03 DIAGNOSIS — F329 Major depressive disorder, single episode, unspecified: Secondary | ICD-10-CM

## 2011-01-03 DIAGNOSIS — L2089 Other atopic dermatitis: Secondary | ICD-10-CM

## 2011-01-03 DIAGNOSIS — L209 Atopic dermatitis, unspecified: Secondary | ICD-10-CM | POA: Insufficient documentation

## 2011-01-03 MED ORDER — TRIAMCINOLONE ACETONIDE 0.1 % EX CREA: TOPICAL_CREAM | Freq: Two times a day (BID) | CUTANEOUS | Status: DC

## 2011-01-03 MED ORDER — METHYLPREDNISOLONE ACETATE PF 80 MG/ML IJ SUSP
120.0000 mg | Freq: Once | INTRAMUSCULAR | Status: AC
  Administered 2011-01-03: 120 mg via INTRAMUSCULAR

## 2011-01-03 MED ORDER — PREDNISONE 10 MG PO TABS: 10.0000 mg | ORAL_TABLET | Freq: Every day | ORAL | Status: DC

## 2011-01-03 MED ORDER — HYDROXYZINE HCL 10 MG PO TABS: 10.0000 mg | ORAL_TABLET | Freq: Four times a day (QID) | ORAL | Status: AC | PRN

## 2011-01-03 NOTE — Patient Instructions (Addendum)
You had the steroid shot today Take all new medications as prescribed - the prednisone, stronger steroid cream, and atarax for itchiing Continue all other medications as before except stop the hydrocortisone cream Please return in April 2013 with Lab testing done 3-5 days before

## 2011-01-04 ENCOUNTER — Encounter: Payer: Self-pay | Admitting: Internal Medicine

## 2011-01-04 NOTE — Assessment & Plan Note (Signed)
stable overall by hx and exam, and pt to continue medical treatment as before 

## 2011-01-04 NOTE — Progress Notes (Signed)
Subjective:    Patient ID: Tina [name], female    DOB: 1981/01/09, 30 y.o.   MRN: [id number]  HPI  Here with 2-3 wks grad worsening pruritic rash worst to the feet, but significant as well to the extremities and back as well, hydrocort cream otc and other otc without signficiant improvement.  Pt denies chest pain, increased sob or doe, wheezing, orthopnea, PND, increased LE swelling, palpitations, dizziness or syncope, and never had the CT with the increased D-dimer last visit.  Pt denies new neurological symptoms such as new headache, or facial or extremity weakness or numbness   Pt denies polydipsia, polyuria,  ADD meds working well with good symptoms control of lack of concentration, task completion, without signficant jittery, wt loss Past Medical History  Diagnosis Date  . LUMBAR RADICULOPATHY, RIGHT   . HYPERLIPIDEMIA   . ANXIETY   . DEPRESSION   . ADD   . COMMON MIGRAINE   . GERD   . CHOLELITHIASIS   . Pruritus ani   . Unspecified backache   . HYPERSOMNIA   . SMOKER    Past Surgical History  Procedure Date  . Cesarean section   . Tonsillectomy     reports that she has been smoking.  She does not have any smokeless tobacco history on file. She reports that she does not drink alcohol or use illicit drugs. family history includes Cancer in her others and paternal grandmother; Depression in her mother; Diabetes in her father and maternal grandmother; Hypertension in her father and maternal grandmother; and Kidney failure in her other. No Known Allergies Current Outpatient Prescriptions on File Prior to Visit  Medication Sig Dispense Refill  . ALPRAZolam (XANAX) 0.5 MG tablet Take 0.5 mg by mouth 2 (two) times daily.        Marland Kitchen amphetamine-dextroamphetamine (ADDERALL) 20 MG tablet Take 1 tablet (20 mg total) by mouth 2 (two) times daily. To fill Dec 21, 2010  60 tablet  0  . cyclobenzaprine (FLEXERIL) 5 MG tablet Take 5 mg by mouth 3 (three) times daily as needed.        Marland Kitchen  FLUoxetine (PROZAC) 40 MG capsule Take 40 mg by mouth daily.        Marland Kitchen HYDROcodone-acetaminophen (NORCO) 10-325 MG per tablet Take 1 tablet by mouth 2 (two) times daily as needed.  60 tablet  1  . omeprazole (PRILOSEC) 20 MG capsule Take 20 mg by mouth daily.        Marland Kitchen sulfamethoxazole-trimethoprim (SEPTRA DS) 800-160 MG per tablet 1 tab by mouth as needed after intercourse  30 tablet  2  . SUMAtriptan (IMITREX) 100 MG tablet Take 100 mg by mouth daily.        . varenicline (CHANTIX) 1 MG tablet Take 1 mg by mouth 2 (two) times daily.         Review of Systems Review of Systems  Constitutional: Negative for diaphoresis and unexpected weight change.  HENT: Negative for drooling and tinnitus.   Eyes: Negative for photophobia and visual disturbance.  Respiratory: Negative for choking and stridor.   Gastrointestinal: Negative for vomiting and blood in stool.  Genitourinary: Negative for hematuria and decreased urine volume.     Objective:   Physical Exam BP 118/74  Pulse 104  Temp(Src) 98.1 F (36.7 C) (Oral)  Wt 262 lb (118.842 kg)  SpO2 99% Physical Exam  VS noted, obese, not ill appearing Constitutional: Pt appears well-developed and well-nourished.  HENT: Head: Normocephalic.  Right Ear: External  ear normal.  Left Ear: External ear normal.  Eyes: Conjunctivae and EOM are normal. Pupils are equal, round, and reactive to light.  Neck: Normal range of motion. Neck supple.  Cardiovascular: Normal rate and regular rhythm.   Pulmonary/Chest: Effort normal and breath sounds normal.  Neurological: Pt is alert. No cranial nerve deficit.  Skin: Skin is warm. No erythema. except for erythem rash nontender nonrasied mild silvery lesions numerous to feet, extremities and back Psychiatric: Pt behavior is normal. Thought content normal. not depressed afffect, 1+ nervous    Assessment & Plan:

## 2011-01-04 NOTE — Assessment & Plan Note (Signed)
Mild to mod, for depomedrol IM, predpack course,  to f/u any worsening symptoms or concerns 

## 2011-01-04 NOTE — Assessment & Plan Note (Signed)
stable overall by hx and exam, most recent data reviewed with pt, and pt to continue medical treatment as before  Lab Results  Component Value Date   WBC 5.2 11/20/2010   HGB 13.0 11/20/2010   HCT 39.2 11/20/2010   PLT 240.0 11/20/2010   GLUCOSE 77 11/20/2010   CHOL 221* 02/08/2010   TRIG 218.0* 02/08/2010   HDL 46.80 02/08/2010   LDLDIRECT 149.6 02/08/2010   ALT 15 11/20/2010   AST 15 11/20/2010   NA 140 11/20/2010   K 4.3 11/20/2010   CL 103 11/20/2010   CREATININE 0.6 11/20/2010   BUN 11 11/20/2010   CO2 31 11/20/2010   TSH 0.96 11/20/2010   INR 0.9 11/17/2007

## 2011-01-16 ENCOUNTER — Encounter: Payer: BC Managed Care – PPO | Admitting: Physical Medicine & Rehabilitation

## 2011-01-23 ENCOUNTER — Encounter: Payer: BC Managed Care – PPO | Admitting: Physical Medicine & Rehabilitation

## 2011-01-23 ENCOUNTER — Encounter: Payer: BC Managed Care – PPO | Attending: Physical Medicine & Rehabilitation

## 2011-01-23 DIAGNOSIS — Z79899 Other long term (current) drug therapy: Secondary | ICD-10-CM | POA: Insufficient documentation

## 2011-01-23 DIAGNOSIS — G8928 Other chronic postprocedural pain: Secondary | ICD-10-CM | POA: Insufficient documentation

## 2011-01-23 DIAGNOSIS — M961 Postlaminectomy syndrome, not elsewhere classified: Secondary | ICD-10-CM | POA: Insufficient documentation

## 2011-01-23 DIAGNOSIS — M47817 Spondylosis without myelopathy or radiculopathy, lumbosacral region: Secondary | ICD-10-CM

## 2011-01-23 NOTE — Assessment & Plan Note (Signed)
This is a patient of Dr. Wynn Banker' is seen for post-laminectomy syndrome.  Again, this was a lost dictation.  This is a patient who was seen on December 4, rated her average pain as 7.  General activity level is 5-7.  Pain is worse in the morning, evening, and at night.  Sleep patterns are fair.  Bending, sitting, standing and activity aggravate. Rest and medication tend to help.  She can walk up to 30 minutes at a time.  Functionally, she works part time.  REVIEW OF SYSTEMS:  Notable for the difficulties described above, otherwise within normal limits.  PAST MEDICAL HISTORY:  Unchanged.  SOCIAL HISTORY:  Unchanged.  FAMILY HISTORY:  Unchanged.  PHYSICAL EXAMINATION:  Her blood pressure is 112/64, pulse 75, respirations 16, O2 sats 99 on room air.  Her motor strength and sensation are intact.  Constitutionally, she is within normal limits. She is alert and oriented x3.  She has normal gait.  IMPRESSION: 1. Postlaminectomy syndrome. 2. Lumbar spondylosis.  PLAN:  Refill or continue Norco 10/325 one p.o. q.i.d., 120 with no refill.  Her questions were encouraged and answered.  We will see her back in a month.     Tina [name] Electronically Signed    RLW/MedQ D:  01/22/2011 12:48:31  T:  01/23/2011 04:16:21  Job #:  [id number]

## 2011-02-03 ENCOUNTER — Other Ambulatory Visit: Payer: Self-pay

## 2011-02-03 DIAGNOSIS — L209 Atopic dermatitis, unspecified: Secondary | ICD-10-CM

## 2011-02-03 MED ORDER — AMPHETAMINE-DEXTROAMPHETAMINE 20 MG PO TABS: 20.0000 mg | ORAL_TABLET | Freq: Two times a day (BID) | ORAL | Status: DC

## 2011-02-03 NOTE — Telephone Encounter (Signed)
Pt is also requesting referral to Dermatologist for rash treated by JWJ at her last visit.

## 2011-02-03 NOTE — Telephone Encounter (Signed)
Pt informed, Rx in cabinet for pt pick up.  Pt is also requesting referral to Dermatology, please advise.

## 2011-02-04 NOTE — Telephone Encounter (Signed)
done

## 2011-02-04 NOTE — Telephone Encounter (Signed)
Addended by: Romero Belling on: 02/04/2011 08:50 AM   Modules accepted: Orders

## 2011-02-04 NOTE — Telephone Encounter (Signed)
Left message informing pt of dermatology referral on VM and to callback office with any questions/concerns.

## 2011-03-10 ENCOUNTER — Ambulatory Visit (HOSPITAL_BASED_OUTPATIENT_CLINIC_OR_DEPARTMENT_OTHER): Payer: BC Managed Care – PPO | Admitting: Physical Medicine & Rehabilitation

## 2011-03-10 ENCOUNTER — Encounter: Payer: BC Managed Care – PPO | Attending: Physical Medicine & Rehabilitation

## 2011-03-10 ENCOUNTER — Encounter: Payer: Self-pay | Admitting: Physical Medicine & Rehabilitation

## 2011-03-10 VITALS — BP 119/58 | HR 87 | Resp 16 | Ht 67.0 in | Wt 260.0 lb

## 2011-03-10 DIAGNOSIS — G8928 Other chronic postprocedural pain: Secondary | ICD-10-CM | POA: Insufficient documentation

## 2011-03-10 DIAGNOSIS — Z79899 Other long term (current) drug therapy: Secondary | ICD-10-CM | POA: Insufficient documentation

## 2011-03-10 DIAGNOSIS — M961 Postlaminectomy syndrome, not elsewhere classified: Secondary | ICD-10-CM | POA: Insufficient documentation

## 2011-03-10 DIAGNOSIS — M47817 Spondylosis without myelopathy or radiculopathy, lumbosacral region: Secondary | ICD-10-CM

## 2011-03-10 NOTE — Progress Notes (Signed)
Right lumbar L2, L3, L4 medial branch blocks  injection under fluoroscopic guidance  Indication: Right Lumbar pain which is not relieved by medication management or other conservative care and interfering with self-care and mobility.  Informed consent was obtained after describing risks and benefits of the procedure with the patient, this includes bleeding, bruising, infection, paralysis and medication side effects. The patient wishes to proceed and has given written consent. The patient was placed in a prone position. The lumbar area was marked and prepped with Betadine. One ML of 1% lidocaine was injected into each of 3 areas into the skin and subcutaneous tissue. Then a 22-gauge 3.5 spinal needle was inserted targeting the junction of the Right L3 superior articular process and transverse process junction. Needle was advanced under fluoroscopic guidance. Bone contact was made. Omnipaque 180 was injected x0.5 mL demonstrating no intravascular uptake. Then a solution containing one ML of 4 mg per mL dexamethasone and 3 mL of 2% MPF lidocaine was injected x0.5 mL. Then the Right L5 superior articular process in transverse process junction was targeted. Bone contact was made. Omnipaque 180 was injected x0.5 mL demonstrating no intravascular uptake. Then a solution containing one ML of 4 mg per mL dexamethasone and 3 mL of 2% MPF lidocaine was injected x0.5 mL. Then the Right L4 superior articular process in transverse process junction was targeted. Bone contact was made. Omnipaque 180 was injected x0.5 mL demonstrating no intravascular uptake. Then a solution containing one ML of 4 mg per mL dexamethasone and 3 mL of 2% MPF lidocaine was injected x0.5 mL Patient tolerated procedure well. Post procedure instructions were given. Please refer to post procedure form.

## 2011-03-10 NOTE — Progress Notes (Signed)
  PROCEDURE RECORD [facility]   Name: Tina [name] DOB:20-Oct-1980 MRN: [id number]  Date:03/10/2011  Physician: Claudette Laws, MD    Nurse/CMA: Azaylah Stailey RN  Allergies:  Allergies  Allergen Reactions  . Neurontin (Gabapentin) Other (See Comments)    MIGRAINE  . Percocet (Oxycodone-Acetaminophen) Itching    SEVERE ITCHING  . Tramadol     MIGRAINE    Consent Signed: yes  Is patient diabetic? no  CBG today?   Pregnant: no LMP: Patient's last menstrual period was 01/27/2011. (age 56-55) Has irregular periods  Anticoagulants: no Anti-inflammatory: no Antibiotics: no  Procedure: Medial Branch Blocks L2,3,4  Position: Prone Start Time: 12:06 p End Time: 12:11p Fluoro Time: 10 sec  RN/CMA Designer, multimedia    Time 11:25 12:15    BP 119/58 117/60    Pulse 87 80    Respirations 16 16    O2 Sat 99% 97%    S/S 6 6    Pain Level 7/10 7/10     D/C home with Marcelle Smiling,   patient A & O X 3, D/C instructions reviewed, and sits independently.

## 2011-03-10 NOTE — Patient Instructions (Signed)
Discharge instructions reviewed with patient and patient was given a copy. 

## 2011-03-14 ENCOUNTER — Encounter: Payer: Self-pay | Admitting: Internal Medicine

## 2011-03-14 ENCOUNTER — Ambulatory Visit (INDEPENDENT_AMBULATORY_CARE_PROVIDER_SITE_OTHER): Payer: BC Managed Care – PPO | Admitting: Internal Medicine

## 2011-03-14 DIAGNOSIS — J040 Acute laryngitis: Secondary | ICD-10-CM

## 2011-03-14 DIAGNOSIS — F17200 Nicotine dependence, unspecified, uncomplicated: Secondary | ICD-10-CM

## 2011-03-14 MED ORDER — AZITHROMYCIN 250 MG PO TABS: ORAL_TABLET | ORAL | Status: AC

## 2011-03-14 MED ORDER — BENZONATATE 200 MG PO CAPS: 200.0000 mg | ORAL_CAPSULE | Freq: Two times a day (BID) | ORAL | Status: AC | PRN

## 2011-03-14 MED ORDER — LIDOCAINE VISCOUS 2 % MT SOLN: 10.0000 mL | OROMUCOSAL | Status: AC | PRN

## 2011-03-14 NOTE — Patient Instructions (Signed)
It was good to see you today. Zpak antibiotics, Tessalon Perles for cough and viscous lidocaine as needed for pain - Your prescription(s) have been submitted to your pharmacy. Please take as directed and contact our office if you believe you are having problem(s) with the medication(s).  Laryngitis Your exam shows you have laryngitis. This condition is due to inflammation around the vocal cords and causes hoarseness and cough. Laryngitis can often be related to a virus infection, excessive smoking, excessive talking or yelling, breathing toxic fumes, allergies, or reflux of acid from your stomach. Treatment is mainly voice rest. Talk as little as possible (this includes whispering). Use written notes to communicate until your voice is back to normal. Avoid smoking cigarettes, drink plenty of clear liquids, and rest frequently until all your symptoms have improved. You should be checked by your doctor if your hoarseness and cough are not improved after 5 days. Please see your doctor or go to the emergency right away if you have:  Trouble breathing or blood in your sputum.     Difficulty swallowing, persistent fever or increasing pain.  Document Released: 12/30/2004 Document Revised: 09/11/2010 Document Reviewed: 06/17/2006 [facility] Patient Information 2012 [place], [place].Smoking Cessation This document explains the best ways for you to quit smoking and new treatments to help. It lists new medicines that can double or triple your chances of quitting and quitting for good. It also considers ways to avoid relapses and concerns you may have about quitting, including weight gain. NICOTINE: A POWERFUL ADDICTION If you have tried to quit smoking, you know how hard it can be. It is hard because nicotine is a very addictive drug. For some people, it can be as addictive as heroin or cocaine. Usually, people make 2 or 3 tries, or more, before finally being able to quit. Each time you try to quit, you can learn  about what helps and what hurts. Quitting takes hard work and a lot of effort, but you can quit smoking. QUITTING SMOKING IS ONE OF THE MOST IMPORTANT THINGS YOU WILL EVER DO.  You will live longer, feel better, and live better.     The impact on your body of quitting smoking is felt almost immediately:     Within 20 minutes, blood pressure decreases. Pulse returns to its normal level.     After 8 hours, carbon monoxide levels in the blood return to normal. Oxygen level increases.     After 24 hours, chance of heart attack starts to decrease. Breath, hair, and body stop smelling like smoke.     After 48 hours, damaged nerve endings begin to recover. Sense of taste and smell improve.     After 72 hours, the body is virtually free of nicotine. Bronchial tubes relax and breathing becomes easier.     After 2 to 12 weeks, lungs can hold more air. Exercise becomes easier and circulation improves.     Quitting will reduce your risk of having a heart attack, stroke, cancer, or lung disease:     After 1 year, the risk of coronary heart disease is cut in half.     After 5 years, the risk of stroke falls to the same as a nonsmoker.     After 10 years, the risk of lung cancer is cut in half and the risk of other cancers decreases significantly.     After 15 years, the risk of coronary heart disease drops, usually to the level of a nonsmoker.  If you are pregnant, quitting smoking will improve your chances of having a healthy baby.     The people you live with, especially your children, will be healthier.     You will have extra money to spend on things other than cigarettes.  FIVE KEYS TO QUITTING Studies have shown that these 5 steps will help you quit smoking and quit for good. You have the best chances of quitting if you use them together: 1. Get ready.    2. Get support and encouragement.    3. Learn new skills and behaviors.    4. Get medicine to reduce your nicotine addiction and  use it correctly.    5. Be prepared for relapse or difficult situations. Be determined to continue trying to quit, even if you do not succeed at first.  1. GET READY  Set a quit date.     Change your environment.     Get rid of ALL cigarettes, ashtrays, matches, and lighters in your home, car, and place of work.     Do not let people smoke in your home.     Review your past attempts to quit. Think about what worked and what did not.     Once you quit, do not smoke. NOT EVEN A PUFF!  2. GET SUPPORT AND ENCOURAGEMENT Studies have shown that you have a better chance of being successful if you have help. You can get support in many ways.  Tell your family, friends, and coworkers that you are going to quit and need their support. Ask them not to smoke around you.     Talk to your caregivers (doctor, dentist, nurse, pharmacist, psychologist, and/or smoking counselor).     Get individual, group, or telephone counseling and support. The more counseling you have, the better your chances are of quitting. Programs are available at [employer] and health centers. Call your local health department for information about programs in your area.     Spiritual beliefs and practices may help some smokers quit.     Quit meters are Photographer that keep track of quit statistics, such as amount of "quit-time," cigarettes not smoked, and money saved.     Many smokers find one or more of the many self-help books available useful in helping them quit and stay off tobacco.  3. LEARN NEW SKILLS AND BEHAVIORS  Try to distract yourself from urges to smoke. Talk to someone, go for a walk, or occupy your time with a task.     When you first try to quit, change your routine. Take a different route to work. Drink tea instead of coffee. Eat breakfast in a different place.     Do something to reduce your stress. Take a hot bath, exercise, or read a book.     Plan something  enjoyable to do every day. Reward yourself for not smoking.     Explore interactive web-based programs that specialize in helping you quit.  4. GET MEDICINE AND USE IT CORRECTLY Medicines can help you stop smoking and decrease the urge to smoke. Combining medicine with the above behavioral methods and support can quadruple your chances of successfully quitting smoking. The U.[place]. Food and Drug Administration (FDA) has approved 7 medicines to help you quit smoking. These medicines fall into 3 categories.  Nicotine replacement therapy (delivers nicotine to your body without the negative effects and risks of smoking):     Nicotine gum: Available over-the-counter.  Nicotine lozenges: Available over-the-counter.     Nicotine inhaler: Available by prescription.     Nicotine nasal spray: Available by prescription.     Nicotine skin patches (transdermal): Available by prescription and over-the-counter.     Antidepressant medicine (helps people abstain from smoking, but how this works is unknown):     Bupropion sustained-release (SR) tablets: Available by prescription.     Nicotinic receptor partial agonist (simulates the effect of nicotine in your brain):     Varenicline tartrate tablets: Available by prescription.     Ask your caregiver for advice about which medicines to use and how to use them. Carefully read the information on the package.     Everyone who is trying to quit may benefit from using a medicine. If you are pregnant or trying to become pregnant, nursing an infant, you are under age 45, or you smoke fewer than 10 cigarettes per day, talk to your caregiver before taking any nicotine replacement medicines.     You should stop using a nicotine replacement product and call your caregiver if you experience nausea, dizziness, weakness, vomiting, fast or irregular heartbeat, mouth problems with the lozenge or gum, or redness or swelling of the skin around the patch that does not go  away.     Do not use any other product containing nicotine while using a nicotine replacement product.     Talk to your caregiver before using these products if you have diabetes, heart disease, asthma, stomach ulcers, you had a recent heart attack, you have high blood pressure that is not controlled with medicine, a history of irregular heartbeat, or you have been prescribed medicine to help you quit smoking.  5. BE PREPARED FOR RELAPSE OR DIFFICULT SITUATIONS  Most relapses occur within the first 3 months after quitting. Do not be discouraged if you start smoking again. Remember, most people try several times before they finally quit.     You may have symptoms of withdrawal because your body is used to nicotine. You may crave cigarettes, be irritable, feel very hungry, cough often, get headaches, or have difficulty concentrating.     The withdrawal symptoms are only temporary. They are strongest when you first quit, but they will go away within 10 to 14 days.  Here are some difficult situations to watch for:  Alcohol. Avoid drinking alcohol. Drinking lowers your chances of successfully quitting.     Caffeine. Try to reduce the amount of caffeine you consume. It also lowers your chances of successfully quitting.     Other smokers. Being around smoking can make you want to smoke. Avoid smokers.     Weight gain. Many smokers will gain weight when they quit, usually less than 10 pounds. Eat a healthy diet and stay active. Do not let weight gain distract you from your main goal, quitting smoking. Some medicines that help you quit smoking may also help delay weight gain. You can always lose the weight gained after you quit.     Bad mood or depression. There are a lot of ways to improve your mood other than smoking.  If you are having problems with any of these situations, talk to your caregiver. SPECIAL SITUATIONS AND CONDITIONS Studies suggest that everyone can quit smoking. Your situation or  condition can give you a special reason to quit.  Pregnant women/new mothers: By quitting, you protect your baby's health and your own.     Hospitalized patients: By quitting, you reduce health problems and help  healing.     Heart attack patients: By quitting, you reduce your risk of a second heart attack.     Lung, head, and neck cancer patients: By quitting, you reduce your chance of a second cancer.     Parents of children and adolescents: By quitting, you protect your children from illnesses caused by secondhand smoke.  QUESTIONS TO THINK ABOUT Think about the following questions before you try to stop smoking. You may want to talk about your answers with your caregiver.  Why do you want to quit?     If you tried to quit in the past, what helped and what did not?     What will be the most difficult situations for you after you quit? How will you plan to handle them?     Who can help you through the tough times? Your family? Friends? Caregiver?     What pleasures do you get from smoking? What ways can you still get pleasure if you quit?  Here are some questions to ask your caregiver:  How can you help me to be successful at quitting?     What medicine do you think would be best for me and how should I take it?     What should I do if I need more help?     What is smoking withdrawal like? How can I get information on withdrawal?  Quitting takes hard work and a lot of effort, but you can quit smoking. FOR MORE INFORMATION   Smokefree.gov (http://www.davis-sullivan.com/) provides free, accurate, evidence-based information and professional assistance to help support the immediate and long-term needs of people trying to quit smoking. Document Released: 12/24/2000 Document Revised: 09/11/2010 Document Reviewed: 10/16/2008 [facility] Patient Information 2012 [place], [place].

## 2011-03-14 NOTE — Progress Notes (Signed)
  Subjective:    Patient ID: Tina [name], female    DOB: October 29, 1980, 31 y.o.   MRN: [id number]  HPI  complains of hoarseness Onset 5 days ago, waxing/waning Initially painless - now sore throat and B earache in last 24h Not associated with fever or  Ongoing dry cough Not improved with OTC lozenges   Past Medical History  Diagnosis Date  . LUMBAR RADICULOPATHY, RIGHT   . HYPERLIPIDEMIA   . ANXIETY   . DEPRESSION   . ADD   . COMMON MIGRAINE   . GERD   . CHOLELITHIASIS   . Pruritus ani   . Unspecified backache   . HYPERSOMNIA   . SMOKER     Review of Systems  Constitutional: Positive for fever. Negative for fatigue.  HENT: Negative for hearing loss, nosebleeds, congestion, neck pain, postnasal drip and ear discharge.        Objective:   Physical Exam BP 102/72  Pulse 91  Temp(Src) 98.9 F (37.2 C) (Oral)  SpO2 99%  LMP 01/27/2011 Wt Readings from Last 3 Encounters:  03/10/11 260 lb (117.935 kg)  01/03/11 262 lb (118.842 kg)  10/30/10 260 lb 8 oz (118.162 kg)   Constitutional:  He appears well-developed and well-nourished. No distress.  HENT: NCAT, nontender sinus, no drainage - Op red but no exudate; TMs clear Neck: Normal range of motion. Neck supple. No JVD present. No thyromegaly present.  Cardiovascular: Normal rate, regular rhythm and normal heart sounds.  No murmur heard. no BLE edema Pulmonary/Chest: Effort normal and breath sounds normal. No respiratory distress. no wheezes.  Skin: Skin is warm and dry.  No erythema or ulceration.  Psychiatric: he has a normal mood and affect. behavior is normal. Judgment and thought content normal.       Assessment & Plan:   Acute laryngitis, suspect viral trigger - treat with empiric antibiotics given duration greater than one week and new developed cough - also Tessalon when necessary and viscous lidocaine when necessary - instructed on salt water gargle and acetaminophen/ibuprofen when needed

## 2011-03-14 NOTE — Assessment & Plan Note (Signed)
5 minutes today spent counseling patient on unhealthy effects of continued tobacco abuse and encouragement of cessation including medical options available to help the patient quit smoking. 

## 2011-03-31 ENCOUNTER — Telehealth: Payer: Self-pay | Admitting: Physical Medicine & Rehabilitation

## 2011-03-31 ENCOUNTER — Encounter: Payer: Self-pay | Admitting: Physical Medicine & Rehabilitation

## 2011-03-31 ENCOUNTER — Other Ambulatory Visit: Payer: Self-pay

## 2011-03-31 MED ORDER — HYDROCODONE-ACETAMINOPHEN 10-325 MG PO TABS: 1.0000 | ORAL_TABLET | Freq: Four times a day (QID) | ORAL | Status: DC

## 2011-03-31 MED ORDER — AMPHETAMINE-DEXTROAMPHETAMINE 20 MG PO TABS: 20.0000 mg | ORAL_TABLET | Freq: Two times a day (BID) | ORAL | Status: DC

## 2011-03-31 NOTE — Telephone Encounter (Signed)
Patient informed prescription is ready f or pickup at the front desk. 

## 2011-03-31 NOTE — Telephone Encounter (Signed)
Rx called in, pt aware 

## 2011-03-31 NOTE — Telephone Encounter (Signed)
Done hardcopy to [facility]  

## 2011-03-31 NOTE — Telephone Encounter (Signed)
Patient needs refill on Norco.

## 2011-04-07 ENCOUNTER — Encounter: Payer: Self-pay | Admitting: Physical Medicine & Rehabilitation

## 2011-04-07 ENCOUNTER — Ambulatory Visit (HOSPITAL_BASED_OUTPATIENT_CLINIC_OR_DEPARTMENT_OTHER): Payer: BC Managed Care – PPO | Admitting: Physical Medicine & Rehabilitation

## 2011-04-07 ENCOUNTER — Encounter: Payer: BC Managed Care – PPO | Attending: Physical Medicine & Rehabilitation

## 2011-04-07 VITALS — BP 123/61 | HR 98 | Resp 16 | Ht 67.0 in | Wt 255.6 lb

## 2011-04-07 DIAGNOSIS — M533 Sacrococcygeal disorders, not elsewhere classified: Secondary | ICD-10-CM | POA: Insufficient documentation

## 2011-04-07 DIAGNOSIS — G8928 Other chronic postprocedural pain: Secondary | ICD-10-CM | POA: Insufficient documentation

## 2011-04-07 DIAGNOSIS — Z79899 Other long term (current) drug therapy: Secondary | ICD-10-CM | POA: Insufficient documentation

## 2011-04-07 DIAGNOSIS — M961 Postlaminectomy syndrome, not elsewhere classified: Secondary | ICD-10-CM | POA: Insufficient documentation

## 2011-04-07 NOTE — Progress Notes (Deleted)
  Subjective:    Patient ID: Tina [name], female    DOB: 12-14-1980, 31 y.o.   MRN: [id number]  HPI    Review of Systems     Objective:   Physical Exam         PROCEDURE RECORD [facility]   Name: Tina [name] DOB:03-Jan-1981 MRN: [id number]  Date:04/07/2011  Physician: {CPRM-PROVIDERS:[id number]}    Nurse/CMA: ***  Allergies:  Allergies  Allergen Reactions  . Neurontin (Gabapentin) Other (See Comments)    Patient states she is not intolerant or allergic to neurontin  . Percocet (Oxycodone-Acetaminophen) Itching    SEVERE ITCHING  . Tramadol     MIGRAINE    Consent Signed: {yes NW:[id number]}  Is patient diabetic? {yes no:314532}  CBG today? ***  Pregnant: {yes no:314532} LMP: Patient's last menstrual period was 01/27/2011. (age 18-55)  Anticoagulants: {Yes/No:19989} Anti-inflammatory: {Yes/No:19989} Antibiotics: {Yes/No:19989}  Procedure: ***  Position: {PRONE/SUPINE/SITTING/LATERAL:21022916} Start Time: ***  End Time: ***  Fluoro Time: ***  RN/CMA *** ***    Time *** ***    BP *** ***    Pulse *** ***    Respirations *** ***    O2 Sat *** ***    S/S *** ***    Pain Level *** ***     D/C home with ***, patient A & O X 3, D/C instructions reviewed, and sits independently.       Assessment & Plan:

## 2011-04-07 NOTE — Progress Notes (Signed)
Right sacroiliac injection under fluoroscopic guidance  Indication: Right Low back and buttocks pain not relieved by medication management and other conservative care.  Informed consent was obtained after describing risks and benefits of the procedure with the patient, this includes bleeding, bruising, infection, paralysis and medication side effects. The patient wishes to proceed and has given written consent. The patient was placed in a prone position. The lumbar and sacral area was marked and prepped with Betadine. A 25-gauge 1-1/2 inch needle was inserted into the skin and subcutaneous tissue and 1 mL of 1% lidocaine was injected. Then a 25-gauge 3 inch spinal needle was inserted under fluoroscopic guidance into the Right sacroiliac joint. AP and lateral images were utilized. Omnipaque 180x0.5 mL under live fluoroscopy demonstrated no intravascular uptake. Then a solution containing one ML of 40 mg per mL depomedrol and 2 ML of 1% lidocaine MPF was injected x1.5 mL. Patient tolerated the procedure well. Post procedure instructions were given. Please see post procedure form. 

## 2011-04-07 NOTE — Progress Notes (Signed)
.    PROCEDURE RECORD [facility]   Name: Tina [name] DOB:Dec 03, 1980 MRN: [id number]  Date:04/07/2011  Physician: Claudette Laws, MD    Nurse/CMA:Lya Holben RN  Allergies:  Allergies  Allergen Reactions  . Neurontin (Gabapentin) Other (See Comments)    Patient states she is not intolerant or allergic to neurontin  . Percocet (Oxycodone-Acetaminophen) Itching    SEVERE ITCHING  . Tramadol     MIGRAINE    Consent Signed: yes  Is patient diabetic? no  CBG today?   Pregnant: no LMP: Patient's last menstrual period was 01/27/2011. (age 23-55) LMP 03/24/2010  Anticoagulants: no Anti-inflammatory: no Antibiotics: no  Procedure: Right SI steroid injection Position: Prone Start Time: 11:03  End Time: 11:07  Fluoro Time:11 sec          RN/CMA [employer]    Time 10:13 11:11    BP 123/61 116/69    Pulse 98 89    Respirations 16 16    O2 Sat 98% 97%    S/S 6 6    Pain Level 10/10 8/10     D/C home with [facility], patient A & O X 3, D/C instructions reviewed, and sits independently.

## 2011-04-07 NOTE — Patient Instructions (Signed)
Sacroiliac Joint Dysfunction The sacroiliac joint connects the lower part of the spine (the sacrum) with the bones of the pelvis. CAUSES  Sometimes, there is no obvious reason for sacroiliac joint dysfunction. Other times, it may occur   During pregnancy.   After injury, such as:   Car accidents.   Sport-related injuries.   Work-related injuries.   Due to one leg being shorter than the other.   Due to other conditions that affect the joints, such as:   Rheumatoid arthritis.   Gout.   Psoriasis.   Joint infection (septic arthritis).  SYMPTOMS  Symptoms may include:  Pain in the:   Lower back.   Buttocks.   Groin.   Thighs and legs.   Difficult sitting, standing, walking, lying, bending or lifting.  DIAGNOSIS  A number of tests may be used to help diagnose the cause of sacroiliac joint dysfunction, including:  Imaging tests to look for other causes of pain, including:   MRI.   CT scan.   Bone scan.   Diagnostic injection: During a special x-ray (called fluoroscopy), a needle is put into the sacroiliac joint. A numbing medicine is injected into the joint. If the pain is improved or stopped, the diagnosis of sacroiliac joint dysfunction is more likely.  TREATMENT  There are a number of types of treatment used for sacroiliac joint dysfunction, including:  Only take over-the-counter or prescription medicines for pain, discomfort, or fever as directed by your caregiver.   Medications to relax muscles.   Rest. Decreasing activity can help cut down on painful muscle spasms and allow the back to heal.   Application of heat or ice to the lower back may improve muscle spasms and soothe pain.   Brace. A special back brace, called a sacroiliac belt, can help support the joint while your back is healing.   Physical therapy can help teach comfortable positions and exercises to strengthen muscles that support the sacroiliac joint.   Cortisone injections. Injections  of steroid medicine into the joint can help decrease swelling and improve pain.   Hyaluronic acid injections. This chemical improves lubrication within the sacroiliac joint, thereby decreasing pain.   Radiofrequency ablation. A special needle is placed into the joint, where it burns away nerves that are carrying pain messages from the joint.   Surgery. Because pain occurs during movement of the joint, screws and plates may be installed in order to limit or prevent joint motion.  HOME CARE INSTRUCTIONS   Take all medications exactly as directed.   Follow instructions regarding both rest and physical activity, to avoid worsening the pain.   Do physical therapy exercises exactly as prescribed.  SEEK IMMEDIATE MEDICAL CARE IF:  You experience increasingly severe pain.   You develop new symptoms, such as numbness or tingling in your legs or feet.   You lose bladder or bowel control.  Document Released: 03/28/2008 Document Revised: 12/19/2010 Document Reviewed: 03/28/2008 [employer] Patient Information 2012 [employer]. 

## 2011-04-08 ENCOUNTER — Encounter: Payer: Self-pay | Admitting: Physical Medicine & Rehabilitation

## 2011-04-29 ENCOUNTER — Other Ambulatory Visit: Payer: Self-pay | Admitting: Physical Medicine & Rehabilitation

## 2011-04-29 ENCOUNTER — Telehealth: Payer: Self-pay | Admitting: Physical Medicine & Rehabilitation

## 2011-04-29 MED ORDER — HYDROCODONE-ACETAMINOPHEN 10-325 MG PO TABS: 1.0000 | ORAL_TABLET | Freq: Four times a day (QID) | ORAL | Status: DC

## 2011-04-29 NOTE — Telephone Encounter (Signed)
Rx called in, pt aware 

## 2011-04-29 NOTE — Telephone Encounter (Signed)
Norco refill

## 2011-05-01 ENCOUNTER — Other Ambulatory Visit: Payer: Self-pay

## 2011-05-01 MED ORDER — AMPHETAMINE-DEXTROAMPHETAMINE 20 MG PO TABS: 20.0000 mg | ORAL_TABLET | Freq: Two times a day (BID) | ORAL | Status: DC

## 2011-05-01 NOTE — Telephone Encounter (Signed)
Called the patient left message that prescription requested is ready for pickup. 

## 2011-05-01 NOTE — Telephone Encounter (Signed)
Pt states that prior authorization of Adderall is about to expire. Pt requests renewal of Adderall [place]

## 2011-05-01 NOTE — Telephone Encounter (Signed)
Done hardcopy to [facility]  

## 2011-05-08 ENCOUNTER — Encounter: Payer: Self-pay | Admitting: Physical Medicine & Rehabilitation

## 2011-05-08 ENCOUNTER — Ambulatory Visit (INDEPENDENT_AMBULATORY_CARE_PROVIDER_SITE_OTHER): Payer: BC Managed Care – PPO | Admitting: Internal Medicine

## 2011-05-08 ENCOUNTER — Encounter: Payer: Self-pay | Admitting: Internal Medicine

## 2011-05-08 ENCOUNTER — Encounter: Payer: BC Managed Care – PPO | Attending: Physical Medicine & Rehabilitation

## 2011-05-08 ENCOUNTER — Ambulatory Visit: Payer: BC Managed Care – PPO | Admitting: Physical Medicine & Rehabilitation

## 2011-05-08 VITALS — BP 106/67 | HR 86 | Resp 16 | Ht 67.0 in | Wt 258.4 lb

## 2011-05-08 VITALS — BP 110/72 | HR 92 | Temp 98.1°F | Ht 67.0 in | Wt 257.0 lb

## 2011-05-08 DIAGNOSIS — F3289 Other specified depressive episodes: Secondary | ICD-10-CM

## 2011-05-08 DIAGNOSIS — M961 Postlaminectomy syndrome, not elsewhere classified: Secondary | ICD-10-CM | POA: Insufficient documentation

## 2011-05-08 DIAGNOSIS — G43009 Migraine without aura, not intractable, without status migrainosus: Secondary | ICD-10-CM

## 2011-05-08 DIAGNOSIS — F988 Other specified behavioral and emotional disorders with onset usually occurring in childhood and adolescence: Secondary | ICD-10-CM

## 2011-05-08 DIAGNOSIS — Z79899 Other long term (current) drug therapy: Secondary | ICD-10-CM | POA: Insufficient documentation

## 2011-05-08 DIAGNOSIS — G8928 Other chronic postprocedural pain: Secondary | ICD-10-CM | POA: Insufficient documentation

## 2011-05-08 DIAGNOSIS — M47816 Spondylosis without myelopathy or radiculopathy, lumbar region: Secondary | ICD-10-CM | POA: Insufficient documentation

## 2011-05-08 DIAGNOSIS — F329 Major depressive disorder, single episode, unspecified: Secondary | ICD-10-CM

## 2011-05-08 DIAGNOSIS — M545 Low back pain: Secondary | ICD-10-CM

## 2011-05-08 DIAGNOSIS — L209 Atopic dermatitis, unspecified: Secondary | ICD-10-CM

## 2011-05-08 DIAGNOSIS — J309 Allergic rhinitis, unspecified: Secondary | ICD-10-CM

## 2011-05-08 DIAGNOSIS — L2089 Other atopic dermatitis: Secondary | ICD-10-CM

## 2011-05-08 DIAGNOSIS — Z0000 Encounter for general adult medical examination without abnormal findings: Secondary | ICD-10-CM

## 2011-05-08 DIAGNOSIS — J302 Other seasonal allergic rhinitis: Secondary | ICD-10-CM | POA: Insufficient documentation

## 2011-05-08 MED ORDER — HYDROXYZINE HCL 25 MG PO TABS: 25.0000 mg | ORAL_TABLET | Freq: Three times a day (TID) | ORAL | Status: AC | PRN

## 2011-05-08 MED ORDER — METHYLPREDNISOLONE ACETATE 80 MG/ML IJ SUSP
120.0000 mg | Freq: Once | INTRAMUSCULAR | Status: AC
  Administered 2011-05-08: 120 mg via INTRAMUSCULAR

## 2011-05-08 MED ORDER — RIZATRIPTAN BENZOATE 10 MG PO TABS: 10.0000 mg | ORAL_TABLET | ORAL | Status: DC | PRN

## 2011-05-08 MED ORDER — TRIAMCINOLONE ACETONIDE 0.1 % EX CREA: TOPICAL_CREAM | Freq: Two times a day (BID) | CUTANEOUS | Status: DC

## 2011-05-08 MED ORDER — PREDNISONE 10 MG PO TABS: ORAL_TABLET | ORAL | Status: DC

## 2011-05-08 NOTE — Assessment & Plan Note (Signed)
With seasonal flare, for storoid tx today, and allegra prn otc

## 2011-05-08 NOTE — Patient Instructions (Addendum)
You had the steroid shot today Take all new medications as prescribed  - the prednisone, steroid cream, hydoxyzine for itching, and the new Maxalt instead of the imitrex Continue all other medications as before Please have the pharmacy call with any refills you may need. You will be contacted regarding the referral for: dermatology Please return in 6 mo with Lab testing done 3-5 days before

## 2011-05-08 NOTE — Progress Notes (Signed)
Subjective:    Patient ID: Tina [name], female    DOB: 1980/02/23, 31 y.o.   MRN: [id number]  HPI  Here to f/u - Does have 1 wk ongoing nasal allergy symptoms with clear congestion, itch and sneeze, without fever, pain, ST, cough or wheezing, but with sinus pressure, no fever, only very occas greenish nasal d/c. No bloody d/c.Pt denies chest pain, increased sob or doe, wheezing, orthopnea, PND, increased LE swelling, palpitations, dizziness or syncope.  Pt denies new neurological symptoms such as new headache, or facial or extremity weakness or numbness   Pt denies polydipsia, polyuria., Denies worsening depressive symptoms, suicidal ideation, or panic, though has ongoing anxiety, not increased recently., has stopped the prozac, seems stable for now.   Sees dr Wynn Banker for back pain, s/po ESI x 3 with little to no help so far, for lumbar DJD.  Also with flare of atopic dermatitis today with marked pruritis along with her allergies in the psat wk, was referred by Dr Everardo All to derm, but was asked to pay $85 up front and did not keep the referral appt.  Still smoking, could not afford the chantix.  Overall migraine pattern stable at 1-2 per wk, but imitrex does not always work well, less so recently. Past Medical History  Diagnosis Date  . LUMBAR RADICULOPATHY, RIGHT   . HYPERLIPIDEMIA   . ANXIETY   . DEPRESSION   . ADD   . COMMON MIGRAINE   . GERD   . CHOLELITHIASIS   . Pruritus ani   . Unspecified backache   . HYPERSOMNIA   . SMOKER   . DJD (degenerative joint disease), lumbar 05/08/2011   Past Surgical History  Procedure Date  . Cesarean section   . Tonsillectomy     reports that she has been smoking Cigarettes.  She has been smoking about .3 packs per day. She has never used smokeless tobacco. She reports that she does not drink alcohol or use illicit drugs. family history includes Cancer in her others and paternal grandmother; Depression in her mother; Diabetes in her father and  maternal grandmother; Hypertension in her father and maternal grandmother; and Kidney failure in her other. Allergies  Allergen Reactions  . Neurontin (Gabapentin) Other (See Comments)    Patient states she is not intolerant or allergic to neurontin  . Percocet (Oxycodone-Acetaminophen) Itching    SEVERE ITCHING  . Tramadol     MIGRAINE   Current Outpatient Prescriptions on File Prior to Visit  Medication Sig Dispense Refill  . amphetamine-dextroamphetamine (ADDERALL) 20 MG tablet Take 1 tablet (20 mg total) by mouth 2 (two) times daily.  60 tablet  0  . HYDROcodone-acetaminophen (NORCO) 10-325 MG per tablet Take 1 tablet by mouth 4 (four) times daily.  120 tablet  0  . methocarbamol (ROBAXIN) 500 MG tablet Take 500 mg by mouth 4 (four) times daily.      . nortriptyline (PAMELOR) 10 MG capsule Take 10 mg by mouth at bedtime.      Marland Kitchen omeprazole (PRILOSEC) 20 MG capsule Take 20 mg by mouth daily.        . SUMAtriptan (IMITREX) 100 MG tablet Take 100 mg by mouth daily.        . diazepam (VALIUM) 10 MG tablet Take 10 mg by mouth once. 1 PO PRIOR TO INJECTION      . FLUoxetine (PROZAC) 40 MG capsule Take 40 mg by mouth daily.        . nabumetone (RELAFEN) 500 MG  tablet Take 500 mg by mouth 2 (two) times daily.      Marland Kitchen sulfamethoxazole-trimethoprim (SEPTRA DS) 800-160 MG per tablet 1 tab by mouth as needed after intercourse  30 tablet  2  . varenicline (CHANTIX) 1 MG tablet Take 1 mg by mouth 2 (two) times daily.         Review of Systems Review of Systems  Constitutional: Negative for diaphoresis and unexpected weight change.  Eyes: Negative for current photophobia and visual disturbance.  Respiratory: Negative for choking and stridor.   Gastrointestinal: Negative for vomiting and blood in stool.  Genitourinary: Negative for hematuria and decreased urine volume.  Musculoskeletal: Negative for gait problem.  Skin: Negative for color change and wound.  Neurological: Negative for tremors and  numbness.  Psychiatric/Behavioral: Negative for decreased concentration. The patient is not hyperactive at this tim, ADD meds working well    Objective:   Physical Exam BP 110/72  Pulse 92  Temp(Src) 98.1 F (36.7 C) (Oral)  Ht 5\' 7"  (1.702 m)  Wt 257 lb (116.574 kg)  BMI 40.25 kg/m2  SpO2 97% Physical Exam  VS noted, severe obese Constitutional: Pt appears well-developed and well-nourished.  HENT: Head: Normocephalic.  Right Ear: External ear normal.  Left Ear: External ear normal.  Bilat tm's mild erythema.  Sinus nontender.  Pharynx mild erythema Eyes: Conjunctivae and EOM are normal. Pupils are equal, round, and reactive to light.  Neck: Normal range of motion. Neck supple.  Cardiovascular: Normal rate and regular rhythm.   Pulmonary/Chest: Effort normal and breath sounds normal.  - no rales or wheezing Neurological: Pt is alert.  Skin: Skin is warm. No erythema. has mult areas of atopic dermatitis type rash to extrmities with excoriations Psychiatric: Pt behavior is normal. Thought content normal. 1+ nervous, not depressed appaearing    Assessment & Plan:

## 2011-05-08 NOTE — Assessment & Plan Note (Signed)
Mild flare, for depomedrol IM, triam cr, predpack, refer derm,  to f/u any worsening symptoms or concerns

## 2011-05-08 NOTE — Assessment & Plan Note (Signed)
stable overall by hx and exam, most recent data reviewed with pt, and pt to continue medical treatment as before, verified nonsuicidal, no counseling needed per pt at this time

## 2011-05-08 NOTE — Assessment & Plan Note (Signed)
stable overall by hx and exam, most recent data reviewed with pt, and pt to continue medical treatment as before  Lab Results  Component Value Date   WBC 5.2 11/20/2010   HGB 13.0 11/20/2010   HCT 39.2 11/20/2010   PLT 240.0 11/20/2010   GLUCOSE 77 11/20/2010   CHOL 221* 02/08/2010   TRIG 218.0* 02/08/2010   HDL 46.80 02/08/2010   LDLDIRECT 149.6 02/08/2010   ALT 15 11/20/2010   AST 15 11/20/2010   NA 140 11/20/2010   K 4.3 11/20/2010   CL 103 11/20/2010   CREATININE 0.6 11/20/2010   BUN 11 11/20/2010   CO2 31 11/20/2010   TSH 0.96 11/20/2010   INR 0.9 11/17/2007    

## 2011-05-08 NOTE — Assessment & Plan Note (Signed)
imitrex not working as well recently, for trial change to maxalt,  to f/u any worsening symptoms or concerns

## 2011-05-08 NOTE — Progress Notes (Deleted)
  PROCEDURE RECORD [facility]   Name: Tina [name] DOB:Apr 08, 1980 MRN: [id number]  Date:05/08/2011  Physician: Claudette Laws, MD    Nurse/CMA: Kelli Churn CMA/Jalani Rominger RN  Allergies:  Allergies  Allergen Reactions  . Neurontin (Gabapentin) Other (See Comments)    Patient states she is not intolerant or allergic to neurontin  . Percocet (Oxycodone-Acetaminophen) Itching    SEVERE ITCHING  . Tramadol     MIGRAINE    Consent Signed: yes  Is patient diabetic? no  CBG today? n/a  Pregnant: no LMP: No LMP recorded. (age 58-55)  04/23/11  Anticoagulants: no Anti-inflammatory: no Antibiotics: no  Procedure: Bilateral Medial Branch Blocks Position: Prone Start Time:  End Time:  Fluoro Time:   RN/CMA Deryck Hippler ***    Time 10:37 ***    BP 106/67 ***    Pulse 86 ***    Respirations 16 ***    O2 Sat 99% ***    S/S 6 ***    Pain Level 8/10 ***     D/C home with father, patient A & O X 3, D/C instructions reviewed, and sits independently.

## 2011-05-09 DIAGNOSIS — M545 Low back pain: Secondary | ICD-10-CM | POA: Insufficient documentation

## 2011-05-28 ENCOUNTER — Other Ambulatory Visit: Payer: Self-pay | Admitting: Physical Medicine & Rehabilitation

## 2011-05-29 ENCOUNTER — Encounter: Payer: BC Managed Care – PPO | Attending: Physical Medicine & Rehabilitation

## 2011-05-29 ENCOUNTER — Ambulatory Visit (HOSPITAL_BASED_OUTPATIENT_CLINIC_OR_DEPARTMENT_OTHER): Payer: BC Managed Care – PPO | Admitting: Physical Medicine & Rehabilitation

## 2011-05-29 ENCOUNTER — Encounter: Payer: Self-pay | Admitting: Physical Medicine & Rehabilitation

## 2011-05-29 DIAGNOSIS — G8928 Other chronic postprocedural pain: Secondary | ICD-10-CM | POA: Insufficient documentation

## 2011-05-29 DIAGNOSIS — M47817 Spondylosis without myelopathy or radiculopathy, lumbosacral region: Secondary | ICD-10-CM

## 2011-05-29 DIAGNOSIS — Z79899 Other long term (current) drug therapy: Secondary | ICD-10-CM | POA: Insufficient documentation

## 2011-05-29 DIAGNOSIS — M961 Postlaminectomy syndrome, not elsewhere classified: Secondary | ICD-10-CM | POA: Insufficient documentation

## 2011-05-29 MED ORDER — HYDROCODONE-ACETAMINOPHEN 10-325 MG PO TABS
1.0000 | ORAL_TABLET | Freq: Four times a day (QID) | ORAL | Status: DC
Start: 2011-05-29 — End: 2011-07-29

## 2011-05-29 NOTE — Patient Instructions (Signed)
Facet Block A facet block is an injection procedure used to numb nerves near a spinal joint (facet). The injection usually includes a medicine like Novacaine (anesthetic) and a steroid medicine (similar to cortisone). The injections are made directly into the facet joint of the back. They are used for patients with several types of neck or back pain problems (such as worsening arthritis or persistent pain after surgery) that have not been helped with anti-inflammatory medications, exercise programs, physical therapy, and other forms of pain management. Multiple injections may be needed depending on how many joints are involved.  A facet block procedure can be helpful with diagnosis as well as providing therapeutic pain relief. One of three things may happen after the procedure:  The pain does not go away. This can mean that the pain is probably not coming from blocked facet joints. This information is helpful with diagnosis.   The pain goes away and stays away for a few hours but the original pain comes back and does not get better again. This information is also helpful with diagnosis. It can mean that pain is probably coming from the joints; but the steroid was not helpful for longer term pain control.   The pain goes away after the block, then returns later that day, and then gets better again over the next few days. This can mean that the block was helpful for pain control and the steroid had a longer lasting effect.  If there is good, lasting benefit from the injections, the block may be repeated from 3 to 5 times. If there is good relief but it is only of short-term benefit, other procedures (such as radiofrequency lesioning) may be considered.  Note: The procedure cannot be performed if you have an active infection, a lesion on or near the area of injection, flu, cold, fever, very high blood pressure or if you are on blood thinners. Please make your doctor aware of any of these conditions. This is  for your safety!  LET YOUR CAREGIVER KNOW ABOUT:   Allergies.   Medications taken including herbs, eye drops, over the counter medications, and creams   Use of steroids (by mouth or creams).   Possible pregnancy, if applicable.   Previous problems with anesthetics or Novocaine.   History of blood clots.   History of bleeding or blood problems.   Previous surgery, particularly of the neck and/or back   Other health problems.  RISKS AND COMPLICATIONS These are very uncommon but include:  Bleeding.   Injury to a nerve near the injection site.   Weakness or numbness in areas controlled by nerves near the injection site.   Infection.   Pain at the site of the injection.   Temporary fluid retention in those who are prone to this problem.   Allergic reaction to anesthetics or medicines used during the procedure.  Diabetics may have a temporary increase in their blood sugar after any surgical procedure, especially if steroids are used. Stinging/burning of the numbing medicine is the most uncomfortable part of the procedure; however every person's response to any procedure is individual.  BEFORE THE PROCEDURE   Your caregiver will provide instructions about stopping any medication before the procedure.   Unless advised otherwise, if the injections are in your neck, you may take your medications as usual with a sip of water but do not eat or drink for 6 hours before the procedure.   Unless advised otherwise, you may eat, drink and take your medications as   usual on the day of the procedure (both before and after) if the injections are to be in your lower back.   There is no other specific preparation necessary unless advised otherwise.  PROCEDURE After checking your blood pressure, the procedure will be done in the x-ray (fluoroscopy) room while lying on your stomach. For procedures in the neck, an intravenous line is usually started. The back is then cleansed with an antiseptic  soap. Sterile drapes are placed in this area. The skin is numbed with a local anesthetic. This is felt as a stinging or burning sensation. Using x-ray guidance, needles are then advanced to the appropriate locations. Once the needles are in the proper location, the anesthetic and steroid is injected through the needles and the needles are removed. The skin is then cleansed and bandages are applied. Blood pressure will be checked again, and you will be discharged to leave with your ride after your caregiver says it is okay to go.  AFTER THE PROCEDURE  You may not drive for the remainder of the day after your procedure. An adult must be present to drive you home or to go with you in a taxi or on public transportation. The procedure will be canceled if you do not have a responsible adult with you! This is for your safety.  HOME CARE INSTRUCTIONS   The bandages noted above can be removed on the morning after the procedure.   Resume medications according to your caregiver's instructions.   No heat is to be used near or over the injected area(s) for the remainder of the day.   No tub bath or soaking in water (such as a pool, jacuzzi, etc.) for the remainder of the day.   Some local tenderness may be experienced for a couple of days after the injection. Using an ice pack three or four times a day will help this.   Keep track of the amount of pain relief as well as how long the pain relief lasted.  SEEK MEDICAL CARE IF:   There is drainage from the injection site.   Pain is not controlled with medications prescribed.   There is significant bleeding or swelling.  SEEK IMMEDIATE MEDICAL CARE IF:   You develop a fever of 101 F (38.3 C) or greater.   Worsening pain, swelling, and/or red streaking develops in the skin around the injection site.   Severe pain develops and cannot be controlled with medications prescribed.   You develop any headache, stiff neck, nausea, vomiting, or your eyes become  very sensitive to light.   Weakness or paralysis develops in arms or legs not present before the procedure.   You develop difficulty urinating or difficulty breathing.  Document Released: 05/21/2006 Document Revised: 12/19/2010 Document Reviewed: 05/11/2008 [employer] Patient Information 2012 [employer]. 

## 2011-05-29 NOTE — Progress Notes (Signed)
Bilateral Lumbar L3, L4  medial branch blocks and L 5 dorsal ramus injection under fluoroscopic guidance  Indication: Lumbar pain which is not relieved by medication management or other conservative care and interfering with self-care and mobility.  Informed consent was obtained after describing risks and benefits of the procedure with the patient, this includes bleeding, infection, paralysis and medication side effects.  The patient states that she is always late on her period and she is 6 days late currently. He states that this is a usual occurrence every month and wishes to proceed. Instructed on the risk of fluoro.  No live fluoro used.The patient wishes to proceed and has given written consent.  The patient was placed in prone position.  The lumbar area was marked and prepped with Betadine.  One mL of 1% lidocaine was injected into each of 6 areas into the skin and subcutaneous tissue.  Then a 22-gauge 5 in spinal needle was inserted targeting the junction of the left S1 superior articular process and sacral ala junction. Needle was advanced under fluoroscopic guidance.  Bone contact was made.  Omnipaque 180 was injected x 0.5 mL demonstrating no intravascular uptake.  Then a solution containing one mL of 4 mg per mL dexamethasone and 3 mL of 2% MPF lidocaine was injected x 0.5 mL.  Then the left L5 superior articular process in transverse process junction was targeted.  Bone contact was made.  Omnipaque 180 was injected x 0.5 mL demonstrating no intravascular uptake. Then a solution containing one mL of 4 mg per mL dexamethasone and 3 mL of 2% MPF lidocaine was injected x 0.5 mL.  Then the left L4 superior articular process in transverse process junction was targeted.  Bone contact was made.  Omnipaque 180 was injected x 0.5 mL demonstrating no intravascular uptake.  Then a solution containing one mL of 4 mg per mL dexamethasone and 3 mL if 2% MPF lidocaine was injected x 0.5 mL.  This same procedure was  performed on the right side using the same needle, technique and injectate.  Patient tolerated procedure well.  Post procedure instructions were given.Pre 7/10, post 4/10

## 2011-05-29 NOTE — Progress Notes (Signed)
  PROCEDURE RECORD [facility]   Name: Tina [name] DOB:01-Jan-1981 MRN: [id number]  Date:05/29/2011  Physician: Claudette Laws, MD    Nurse/CMA: Kelli Churn  Allergies:  Allergies  Allergen Reactions  . Neurontin (Gabapentin) Other (See Comments)    Patient states she is not intolerant or allergic to neurontin  . Percocet (Oxycodone-Acetaminophen) Itching    SEVERE ITCHING  . Tramadol     MIGRAINE    Consent Signed: yes  Is patient diabetic? no  CBG today?   Pregnant: no Pt consented to late periods LMP: Patient's last menstrual period was 04/23/2011. (age 19-55)  Anticoagulants: no Anti-inflammatory: no Antibiotics: no  Procedure: Medial Branch Block  Position: Prone Start Time:  9"55 am End Time: 10:00 am  Fluoro Time: 8  RN/CMA Dearia Wilmouth Zahari Fazzino    Time 9:38 am 10:04 am    BP 129/63 133/74    Pulse 93 97    Respirations 16 16    O2 Sat 98 98    S/S 6 6    Pain Level 7 6     D/C home with Tina [name], patient A & O X 3, D/C instructions reviewed, and sits independently.

## 2011-05-30 ENCOUNTER — Other Ambulatory Visit: Payer: Self-pay | Admitting: Physical Medicine & Rehabilitation

## 2011-05-30 ENCOUNTER — Telehealth: Payer: Self-pay | Admitting: *Deleted

## 2011-05-30 NOTE — Telephone Encounter (Signed)
Refill Norco

## 2011-05-30 NOTE — Telephone Encounter (Signed)
This has been sent in  

## 2011-06-12 ENCOUNTER — Other Ambulatory Visit: Payer: Self-pay

## 2011-06-12 MED ORDER — AMPHETAMINE-DEXTROAMPHETAMINE 20 MG PO TABS: 20.0000 mg | ORAL_TABLET | Freq: Two times a day (BID) | ORAL | Status: DC

## 2011-06-12 NOTE — Telephone Encounter (Signed)
Called informed the patient to pickup prescription requested at the front desk.

## 2011-06-12 NOTE — Telephone Encounter (Signed)
Done hardcopy to [facility]  

## 2011-06-16 ENCOUNTER — Telehealth: Payer: Self-pay

## 2011-06-16 MED ORDER — AMPHETAMINE-DEXTROAMPHETAMINE 20 MG PO TABS: 20.0000 mg | ORAL_TABLET | Freq: Two times a day (BID) | ORAL | Status: DC

## 2011-06-16 NOTE — Telephone Encounter (Signed)
Done hardcopy to [facility]  

## 2011-06-16 NOTE — Telephone Encounter (Signed)
Received PA form for Adderall 20 mg BID.  Called the patient per MD instructions to inquire if ok to change per insurance.  Patient stated she could try changing not sure if would work as well or not. Will complete form and fax back to [facility].

## 2011-06-17 NOTE — Telephone Encounter (Signed)
Faxed completed PA to [facility] at [phone] awaiting approval.  Called the patient informed prescription is ready for pickup at the front desk.

## 2011-06-19 NOTE — Telephone Encounter (Signed)
Received Approval for Medication 05/19/2011 through 06/17/2012. Case ID [id number].  Called the patient informed of approval and faxed approval letter to [facility] on [address] to inform.  Approval letter sent to scan and put copy of letter in approval folder as well.

## 2011-06-26 ENCOUNTER — Ambulatory Visit (HOSPITAL_BASED_OUTPATIENT_CLINIC_OR_DEPARTMENT_OTHER): Payer: BC Managed Care – PPO | Admitting: Physical Medicine & Rehabilitation

## 2011-06-26 ENCOUNTER — Encounter: Payer: BC Managed Care – PPO | Attending: Physical Medicine & Rehabilitation

## 2011-06-26 ENCOUNTER — Encounter: Payer: Self-pay | Admitting: Physical Medicine & Rehabilitation

## 2011-06-26 VITALS — BP 128/68 | HR 100 | Resp 16 | Ht 67.0 in | Wt 260.0 lb

## 2011-06-26 DIAGNOSIS — M961 Postlaminectomy syndrome, not elsewhere classified: Secondary | ICD-10-CM | POA: Insufficient documentation

## 2011-06-26 DIAGNOSIS — G8928 Other chronic postprocedural pain: Secondary | ICD-10-CM | POA: Insufficient documentation

## 2011-06-26 DIAGNOSIS — Z79899 Other long term (current) drug therapy: Secondary | ICD-10-CM | POA: Insufficient documentation

## 2011-06-26 NOTE — Progress Notes (Signed)
Subjective:    Patient ID: Tina [name], female    DOB: May 10, 1980, 31 y.o.   MRN: [id number]  HPI 40% relief with medial branch blocks bilateral L3-L4 and L5 dorsal ramus. No other new medical problems. She really does not do much exercise. We discussed other exercise options including aquatic exercises as well as walking. She would like to come off of pain medicine if possible or at least reduce her dose, she continues taking hydrocodone 10 mg 4 times per day Pain Inventory Average Pain 7 Pain Right Now 8 My pain is constant, sharp, stabbing and aching  In the last 24 hours, has pain interfered with the following? General activity 6 Relation with others 6 Enjoyment of life 5 What TIME of day is your pain at its worst? Morning and Night Sleep (in general) Fair  Pain is worse with: walking and some activites Pain improves with: rest and medication Relief from Meds: 7  Mobility walk without assistance how many minutes can you walk? 15-20 ability to climb steps?  yes do you drive?  yes  Function employed # of hrs/week 32  Neuro/Psych No problems in this area  Prior Studies Any changes since last visit?  no  Physicians involved in your care Any changes since last visit?  no   Family History  Problem Relation Age of Onset  . Depression Mother   . Diabetes Father   . Hypertension Father   . Diabetes Maternal Grandmother     Dialysis  . Hypertension Maternal Grandmother   . Cancer Paternal Grandmother     Breast Cancer  . Cancer Other     colon  . Kidney failure Other     dialysis  . Cancer Other     colon   History   Social History  . Marital Status: Single    Spouse Name: N/A    Number of Children: 1  . Years of Education: N/A   Occupational History  . [employer]  . part time student [employer]    Social History Main Topics  . Smoking status: Current Everyday Smoker -- 0.3 packs/day    Types: Cigarettes  . Smokeless tobacco: Never Used  .  Alcohol Use: No  . Drug Use: No  . Sexually Active: None   Other Topics Concern  . None   Social History Narrative   Also raises 42 yo nephew   Past Surgical History  Procedure Date  . Cesarean section   . Tonsillectomy    Past Medical History  Diagnosis Date  . LUMBAR RADICULOPATHY, RIGHT   . HYPERLIPIDEMIA   . ANXIETY   . DEPRESSION   . ADD   . COMMON MIGRAINE   . GERD   . CHOLELITHIASIS   . Pruritus ani   . Unspecified backache   . HYPERSOMNIA   . SMOKER   . 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 Allergic rhinitis, cause unspecified 05/08/2011   BP 128/68  Pulse 100  Resp 16  Ht 5\' 7"  (1.702 m)  Wt 260 lb (117.935 kg)  BMI 40.72 kg/m2  SpO2 97%      Review of Systems  Constitutional: Negative.   HENT: Negative.   Eyes: Negative.   Respiratory: Negative.   Cardiovascular: Negative.   Gastrointestinal: Negative.   Genitourinary: Negative.   Musculoskeletal: Positive for back pain.  Skin: Negative.   Neurological: Negative.   Hematological: Negative.   Psychiatric/Behavioral: Negative.        Objective:  Physical Exam  Constitutional: She is oriented to person, place, and time. She appears well-developed.       obese  Musculoskeletal:       Lumbar back: She exhibits decreased range of motion and tenderness. She exhibits no swelling, no edema, no deformity and no spasm.  Neurological: She is alert and oriented to person, place, and time. She has normal strength. She displays normal reflexes. No sensory deficit. She exhibits normal muscle tone. Coordination and gait normal.          Assessment & Plan:  1. Lumbosacral spondylosis with chronic pain syndrome. She has not responded positively to medial branch blocks. She's had minor relief with medial branch blocks. 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 I encouraged aquatic exercise with or without walking. Over time the goal is to reduce her pain  medications. Discussed this with the patient she has agreed with the plan. She'll see my PA in 3 months

## 2011-06-27 ENCOUNTER — Other Ambulatory Visit: Payer: Self-pay | Admitting: Physical Medicine & Rehabilitation

## 2011-06-27 ENCOUNTER — Ambulatory Visit: Payer: BC Managed Care – PPO | Admitting: Physical Medicine & Rehabilitation

## 2011-06-27 DIAGNOSIS — M47817 Spondylosis without myelopathy or radiculopathy, lumbosacral region: Secondary | ICD-10-CM

## 2011-07-14 ENCOUNTER — Other Ambulatory Visit: Payer: Self-pay

## 2011-07-14 MED ORDER — AMPHETAMINE-DEXTROAMPHETAMINE 20 MG PO TABS: 20.0000 mg | ORAL_TABLET | Freq: Two times a day (BID) | ORAL | Status: DC

## 2011-07-14 NOTE — Telephone Encounter (Signed)
Called the patient informed prescription is ready for pickup at the front desk. 

## 2011-07-14 NOTE — Telephone Encounter (Signed)
Done hardcopy to [facility]  

## 2011-07-21 ENCOUNTER — Ambulatory Visit (INDEPENDENT_AMBULATORY_CARE_PROVIDER_SITE_OTHER): Payer: BC Managed Care – PPO | Admitting: Internal Medicine

## 2011-07-21 ENCOUNTER — Encounter: Payer: Self-pay | Admitting: Internal Medicine

## 2011-07-21 VITALS — BP 110/72 | HR 80 | Temp 97.8°F | Ht 67.0 in | Wt 255.5 lb

## 2011-07-21 DIAGNOSIS — J309 Allergic rhinitis, unspecified: Secondary | ICD-10-CM

## 2011-07-21 DIAGNOSIS — L299 Pruritus, unspecified: Secondary | ICD-10-CM | POA: Insufficient documentation

## 2011-07-21 DIAGNOSIS — F411 Generalized anxiety disorder: Secondary | ICD-10-CM

## 2011-07-21 DIAGNOSIS — L508 Other urticaria: Secondary | ICD-10-CM

## 2011-07-21 DIAGNOSIS — H9202 Otalgia, left ear: Secondary | ICD-10-CM

## 2011-07-21 DIAGNOSIS — H9209 Otalgia, unspecified ear: Secondary | ICD-10-CM

## 2011-07-21 MED ORDER — METHYLPREDNISOLONE ACETATE 80 MG/ML IJ SUSP
120.0000 mg | Freq: Once | INTRAMUSCULAR | Status: AC
  Administered 2011-07-21: 120 mg via INTRAMUSCULAR

## 2011-07-21 MED ORDER — METHYLPREDNISOLONE 4 MG PO KIT: PACK | ORAL | Status: AC

## 2011-07-21 MED ORDER — CETIRIZINE HCL 10 MG PO TABS: 10.0000 mg | ORAL_TABLET | Freq: Every day | ORAL | Status: DC

## 2011-07-21 NOTE — Assessment & Plan Note (Signed)
Worsening in last 2-3 mo,  Never saw derm for pruritis due to need for money upfront despite having insurance;  Ok for allergy referral

## 2011-07-21 NOTE — Patient Instructions (Addendum)
You had the steroid shot today Take all new medications as prescribed - the medrol pack, and generic zyrtec 10 mg per day Please call if you change your mind about the generic nasal spray such as flonase for the sinus symptoms You can also use Mucinex D (OTC) twice per day as needed for congestion, which may help the left ear pain as well Continue all other medications as before You will be contacted regarding the referral for: allergist

## 2011-07-21 NOTE — Assessment & Plan Note (Signed)
With recent flare - for depomedrol IM, and medrol pack asd, also for zyrtec asd prn, declines flonase, but ok with referral to allergy

## 2011-07-21 NOTE — Assessment & Plan Note (Signed)
With discomfort related to marked eustachian symtpoms - also for mucinex D otc bid prn

## 2011-07-21 NOTE — Progress Notes (Signed)
Subjective:    Patient ID: Tina [name], female    DOB: 02/28/1980, 31 y.o.   MRN: [id number]  HPI  Here to f/u - Does have several wks ongoing nasal allergy symptoms with clear congestion, itch and sneeze, without fever, pain, ST, cough or wheezing, also with left ear popping and mild discomfort as well  Has had marked pruritis "all over" for 2-3 months with intermittent rash..  Denies worsening depressive symptoms, suicidal ideation, or panic, though has ongoing anxiety, not increased recently.  Pt denies fever, wt loss, night sweats, loss of appetite, or other constitutional symptoms  Pt denies chest pain, increased sob or doe, wheezing, orthopnea, PND, increased LE swelling, palpitations, dizziness or syncope.   Pt denies new neurological symptoms such as new headache, or facial or extremity weakness or numbness   Pt denies polydipsia, polyuria, Past Medical History  Diagnosis Date  . LUMBAR RADICULOPATHY, RIGHT   . HYPERLIPIDEMIA   . ANXIETY   . DEPRESSION   . ADD   . COMMON MIGRAINE   . GERD   . CHOLELITHIASIS   . Pruritus ani   . Unspecified backache   . HYPERSOMNIA   . SMOKER   . 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 Allergic rhinitis, cause unspecified 05/08/2011   Past Surgical History  Procedure Date  . Cesarean section   . Tonsillectomy     reports that she has been smoking Cigarettes.  She has been smoking about .3 packs per day. She has never used smokeless tobacco. She reports that she does not drink alcohol or use illicit drugs. family history includes Cancer in her others and paternal grandmother; Depression in her mother; Diabetes in her father and maternal grandmother; Hypertension in her father and maternal grandmother; and Kidney failure in her other. Allergies  Allergen Reactions  . Neurontin (Gabapentin) Other (See Comments)    Patient states she is not intolerant or allergic to neurontin  . Percocet (Oxycodone-Acetaminophen) Itching   SEVERE ITCHING  . Tramadol     MIGRAINE   Current Outpatient Prescriptions on File Prior to Visit  Medication Sig Dispense Refill  . amphetamine-dextroamphetamine (ADDERALL) 20 MG tablet Take 1 tablet (20 mg total) by mouth 2 (two) times daily. To fill July 2 , 2013  60 tablet  0  . HYDROcodone-acetaminophen (NORCO) 10-325 MG per tablet Take 1 tablet by mouth 4 (four) times daily.  120 tablet  0  . hydrOXYzine (ATARAX/VISTARIL) 25 MG tablet       . methocarbamol (ROBAXIN) 500 MG tablet Take 500 mg by mouth 4 (four) times daily.      . nortriptyline (PAMELOR) 10 MG capsule Take 10 mg by mouth at bedtime.      Marland Kitchen omeprazole (PRILOSEC) 20 MG capsule Take 20 mg by mouth daily.        . predniSONE (DELTASONE) 10 MG tablet 3 tabs by mouth per day for 3 days,2tabs per day for 3 days,1tab per day for 3 days  18 tablet  0  . rizatriptan (MAXALT) 10 MG tablet Take 1 tablet (10 mg total) by mouth as needed for migraine. May repeat in 2 hours if needed  10 tablet  5  . triamcinolone cream (KENALOG) 0.1 % Apply topically 2 (two) times daily.  30 g  2  . cetirizine (ZYRTEC) 10 MG tablet Take 1 tablet (10 mg total) by mouth daily.  90 tablet  3   Review of Systems Review of Systems  Constitutional: Negative for diaphoresis  and unexpected weight change.  HENT: Negative for drooling and tinnitus.   Eyes: Negative for photophobia and visual disturbance.  Respiratory: Negative for choking and stridor.   Gastrointestinal: Negative for vomiting and blood in stool.  Genitourinary: Negative for hematuria and decreased urine volume.  Musculoskeletal: Negative for gait problem.  Skin: Negative for color change and wound.  Neurological: Negative for tremors and numbness.  Psychiatric/Behavioral: Negative for decreased concentration.    Objective:   Physical Exam BP 110/72  Pulse 80  Temp 97.8 F (36.6 C) (Oral)  Ht 5\' 7"  (1.702 m)  Wt 255 lb 8 oz (115.894 kg)  BMI 40.02 kg/m2  SpO2 98% Physical Exam  VS  noted Constitutional: Pt appears well-developed and well-nourished.  HENT: Head: Normocephalic.  Right Ear: External ear normal.  Left Ear: External ear normal.  Bilat tm's mild erythema.  Sinus nontender.  Pharynx mild erythema Eyes: Conjunctivae and EOM are normal. Pupils are equal, round, and reactive to light.  Neck: Normal range of motion. Neck supple.  Cardiovascular: Normal rate and regular rhythm.   Pulmonary/Chest: Effort normal and breath sounds normal.  Neurological: Pt is alert. Not confused Skin: Skin is warm. No erythema No rash.  Psychiatric: Pt behavior is normal. Thought content normal. 1+ nervous, not depressed affect     Assessment & Plan:

## 2011-07-22 ENCOUNTER — Encounter: Payer: Self-pay | Admitting: Internal Medicine

## 2011-07-22 NOTE — Assessment & Plan Note (Signed)
stable overall by hx and exam, most recent data reviewed with pt, and pt to continue medical treatment as before  Lab Results  Component Value Date   WBC 5.2 11/20/2010   HGB 13.0 11/20/2010   HCT 39.2 11/20/2010   PLT 240.0 11/20/2010   GLUCOSE 77 11/20/2010   CHOL 221* 02/08/2010   TRIG 218.0* 02/08/2010   HDL 46.80 02/08/2010   LDLDIRECT 149.6 02/08/2010   ALT 15 11/20/2010   AST 15 11/20/2010   NA 140 11/20/2010   K 4.3 11/20/2010   CL 103 11/20/2010   CREATININE 0.6 11/20/2010   BUN 11 11/20/2010   CO2 31 11/20/2010   TSH 0.96 11/20/2010   INR 0.9 11/17/2007    

## 2011-07-29 ENCOUNTER — Telehealth: Payer: Self-pay | Admitting: Physical Medicine & Rehabilitation

## 2011-07-29 MED ORDER — HYDROCODONE-ACETAMINOPHEN 10-325 MG PO TABS: 1.0000 | ORAL_TABLET | Freq: Four times a day (QID) | ORAL | Status: DC

## 2011-07-29 NOTE — Telephone Encounter (Signed)
Rx has been called in, pt aware. 

## 2011-07-29 NOTE — Telephone Encounter (Signed)
Refill on Norco

## 2011-08-19 ENCOUNTER — Telehealth: Payer: Self-pay | Admitting: *Deleted

## 2011-08-19 MED ORDER — AMPHETAMINE-DEXTROAMPHETAMINE 20 MG PO TABS: 20.0000 mg | ORAL_TABLET | Freq: Two times a day (BID) | ORAL | Status: DC

## 2011-08-19 NOTE — Telephone Encounter (Signed)
Done hardcopy to [facility]  

## 2011-08-19 NOTE — Telephone Encounter (Signed)
Left msg on triage needing refill on her adderral.... 08/19/11@2 :28pm/LMB

## 2011-08-20 NOTE — Telephone Encounter (Signed)
Called the patient this am and informed that prescription requested has been filled and she can pickup hardcopy at front desk at her convenience.

## 2011-08-25 ENCOUNTER — Telehealth: Payer: Self-pay | Admitting: Physical Medicine & Rehabilitation

## 2011-08-25 NOTE — Telephone Encounter (Signed)
Returning Sybil's call. 

## 2011-08-25 NOTE — Telephone Encounter (Signed)
Left msg to [facility]

## 2011-08-25 NOTE — Telephone Encounter (Signed)
I spoke with Tina [name] and told her we will call her Norco in 08/27/11.

## 2011-08-25 NOTE — Telephone Encounter (Signed)
Needs refill on Norco

## 2011-08-29 ENCOUNTER — Other Ambulatory Visit: Payer: Self-pay | Admitting: Physical Medicine & Rehabilitation

## 2011-08-29 ENCOUNTER — Encounter: Payer: Self-pay | Admitting: Internal Medicine

## 2011-08-29 ENCOUNTER — Other Ambulatory Visit: Payer: Self-pay | Admitting: Internal Medicine

## 2011-08-29 ENCOUNTER — Other Ambulatory Visit (INDEPENDENT_AMBULATORY_CARE_PROVIDER_SITE_OTHER): Payer: BC Managed Care – PPO

## 2011-08-29 ENCOUNTER — Other Ambulatory Visit: Payer: Self-pay | Admitting: *Deleted

## 2011-08-29 ENCOUNTER — Ambulatory Visit (INDEPENDENT_AMBULATORY_CARE_PROVIDER_SITE_OTHER): Payer: BC Managed Care – PPO | Admitting: Internal Medicine

## 2011-08-29 VITALS — BP 104/62 | HR 97 | Ht 67.0 in | Wt 254.4 lb

## 2011-08-29 DIAGNOSIS — J302 Other seasonal allergic rhinitis: Secondary | ICD-10-CM

## 2011-08-29 DIAGNOSIS — L508 Other urticaria: Secondary | ICD-10-CM

## 2011-08-29 DIAGNOSIS — J309 Allergic rhinitis, unspecified: Secondary | ICD-10-CM

## 2011-08-29 DIAGNOSIS — L509 Urticaria, unspecified: Secondary | ICD-10-CM

## 2011-08-29 DIAGNOSIS — R21 Rash and other nonspecific skin eruption: Secondary | ICD-10-CM

## 2011-08-29 LAB — CBC WITH DIFFERENTIAL/PLATELET
Basophils Absolute: 0 10*3/uL (ref 0.0–0.1)
Basophils Relative: 0.3 % (ref 0.0–3.0)
Eosinophils Absolute: 0 10*3/uL (ref 0.0–0.7)
Lymphocytes Relative: 28.9 % (ref 12.0–46.0)
MCHC: 32.7 g/dL (ref 30.0–36.0)
Monocytes Relative: 6.2 % (ref 3.0–12.0)
Neutrophils Relative %: 64.2 % (ref 43.0–77.0)
RBC: 4.89 Mil/uL (ref 3.87–5.11)
RDW: 15.4 % — ABNORMAL HIGH (ref 11.5–14.6)

## 2011-08-29 MED ORDER — HYDROCODONE-ACETAMINOPHEN 10-325 MG PO TABS: 1.0000 | ORAL_TABLET | Freq: Four times a day (QID) | ORAL | Status: DC

## 2011-08-29 NOTE — Patient Instructions (Addendum)
Suggest trial of double therapy:     Allegra 180/ fexofenadine  Once daily     Pepcid 20 mg, once daily  Order lab- CBC w/ diff                         Dx Chronic Uriticaria                   Allergy Profile                   Food IgE allergy profile                   IgE to AlphaGal

## 2011-08-29 NOTE — Progress Notes (Signed)
08/29/11- 31yo F former smoker seen for allergy evaluation because of hives.  PCP Dr Jonny Ruiz She describes severe itching and rash which began in September of 2012, present almost every day since then. She had no similar problem before. She has taken high doses of Benadryl without benefit. In August of 2012 she had moved into an old trailer and rash was worse while living there. It improved with she moved away but did not stop completely. They did keep a few pieces of furniture which transferred to the new home but she is planning to get wears those now. No obvious association with any exposure or environment otherwise. She has tried switching to hypoallergenic detergents etc. She gives history that Percocet causes itching. She denies any hormone therapy, liver or thyroid disease or other systemic illness. Denies pregnancy. No history of seasonal allergy, indoor pets or food intolerance. Environmental-living in a mobile home with no basement and no obvious mold. She recently quit smoking. Works at [employer]. No family history of urticaria.  Prior to Admission medications   Medication Sig Start Date End Date Taking? Authorizing Provider  amphetamine-dextroamphetamine (ADDERALL) 20 MG tablet Take 1 tablet (20 mg total) by mouth 2 (two) times daily. 08/19/11  Yes Corwin Levins, MD  HYDROcodone-acetaminophen [facility]) 10-325 MG per tablet Take 1 tablet by mouth 4 (four) times daily. 08/29/11  Yes [name] Prueter, PA-C  omeprazole (PRILOSEC) 20 MG capsule Take 20 mg by mouth daily.     Yes Historical Provider, MD  rizatriptan (MAXALT) 10 MG tablet Take 1 tablet (10 mg total) by mouth as needed for migraine. May repeat in 2 hours if needed 05/08/11 05/07/12 Yes Corwin Levins, MD  predniSONE (DELTASONE) 20 MG tablet Take 1 tablet (20 mg total) by mouth daily. 09/04/11 09/14/11  Waymon Budge, MD   Past Medical History  Diagnosis Date  . LUMBAR RADICULOPATHY, RIGHT   . HYPERLIPIDEMIA   . ANXIETY   . DEPRESSION   . ADD     . COMMON MIGRAINE   . GERD   . CHOLELITHIASIS   . Pruritus ani   . Unspecified backache   . HYPERSOMNIA   . SMOKER   . 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 Allergic rhinitis, cause unspecified 05/08/2011   Past Surgical History  Procedure Date  . Cesarean section   . Tonsillectomy   . Back surgery 11/09   Family History  Problem Relation Age of Onset  . Depression Mother   . Diabetes Father   . Hypertension Father   . Diabetes Maternal Grandmother     Dialysis  . Hypertension Maternal Grandmother   . Cancer Paternal Grandmother     Breast Cancer  . Cancer Other     colon  . Kidney failure Other     dialysis  . Cancer Other     colon  . Heart disease Maternal Grandmother    History   Social History  . Marital Status: Single    Spouse Name: N/A    Number of Children: 1  . Years of Education: N/A   Occupational History  . Scientist, forensic  . part time student [employer]    Social History Main Topics  . Smoking status: Former Smoker -- 0.3 packs/day    Types: Cigarettes    Quit date: 08/01/2011  . Smokeless tobacco: Never Used  . Alcohol Use: No  . Drug Use: No  . Sexually Active: Not on file   Other Topics Concern  .  Not on file   Social History Narrative   Also raises 31 yo nephew   ROS-see HPI Constitutional:   No-   weight loss, night sweats, fevers, chills, fatigue, lassitude. HEENT:   No-  headaches, difficulty swallowing, tooth/dental problems, sore throat,       +  sneezing, itching, ear ache, nasal congestion, post nasal drip,  CV:  No-   chest pain, orthopnea, PND, swelling in lower extremities, anasarca, dizziness, palpitations Resp: No-   shortness of breath with exertion or at rest.              No-   productive cough,  No non-productive cough,  No- coughing up of blood.              No-   change in color of mucus.  No- wheezing.   Skin: + per HPI. GI:  +  heartburn, indigestion,  No-abdominal pain, nausea, vomiting,  diarrhea,                 change in bowel habits, loss of appetite GU: No-   dysuria, change in color of urine, no urgency or frequency.  No- flank pain. MS:  No-   joint pain or swelling.  No- decreased range of motion.  No- back pain. Neuro-     nothing unusual Psych:  No- change in mood or affect. No depression or anxiety.  No memory loss.  OBJ- Physical Exam General- Alert, Oriented, Affect-appropriate, Distress- none acute, obese Skin- rash-none, lesions- none, excoriation- none. No visible rash now. Lymphadenopathy- none Head- atraumatic            Eyes- Gross vision intact, PERRLA, conjunctivae and secretions clear            Ears- Hearing, canals-normal            Nose- Clear, no-Septal dev, mucus, polyps, erosion, perforation             Throat- Mallampati II , mucosa clear , drainage- none, tonsils- atrophic Neck- flexible , trachea midline, no stridor , thyroid nl, carotid no bruit Chest - symmetrical excursion , unlabored           Heart/CV- RRR , no murmur , no gallop  , no rub, nl s1 s2                           - JVD- none , edema- none, stasis changes- none, varices- none           Lung- clear to P&A, wheeze- none, cough- none , dullness-none, rub- none           Chest wall-  Abd- tender-no, distended-no, bowel sounds-present, HSM- no Br/ Gen/ Rectal- Not done, not indicated Extrem- cyanosis- none, clubbing, none, atrophy- none, strength- nl Neuro- grossly intact to observation

## 2011-09-02 LAB — ALLERGY FULL AND FOOD SPECIFIC PROFILE
Allergen, D pternoyssinus,d7: 15.7 kU/L — ABNORMAL HIGH
Apple: 0.86 kU/L — ABNORMAL HIGH
Aspergillus fumigatus, IgG: 0.11 kU/L — ABNORMAL HIGH
Bahia Grass: 1.5 kU/L — ABNORMAL HIGH
Box Elder IgE: 1.23 kU/L — ABNORMAL HIGH
Chicken IgE: 0.12 kU/L — ABNORMAL HIGH
Common Ragweed: 1.48 kU/L — ABNORMAL HIGH
D. farinae: 14.1 kU/L — ABNORMAL HIGH
Egg White IgE: <0.1 kU/L
Elm IgE: 1.31 kU/L — ABNORMAL HIGH
G005 Rye, Perennial: 1.49 kU/L — ABNORMAL HIGH
G009 Red Top: 1.37 kU/L — ABNORMAL HIGH
House Dust Hollister: 1.91 kU/L — ABNORMAL HIGH
IgE (Immunoglobulin E), Serum: 1965.8 IU/mL — ABNORMAL HIGH (ref 0.0–180.0)
Milk IgE: 0.11 kU/L — ABNORMAL HIGH
Oak: 1.78 kU/L — ABNORMAL HIGH
Orange: 0.92 kU/L — ABNORMAL HIGH
Peanut IgE: 1.17 kU/L — ABNORMAL HIGH
Plantain: 1.38 kU/L — ABNORMAL HIGH
Shrimp IgE: 6.81 kU/L — ABNORMAL HIGH
Stemphylium Botryosum: 0.12 kU/L — ABNORMAL HIGH
Timothy Grass: 1.67 kU/L — ABNORMAL HIGH
Tomato IgE: 1.11 kU/L — ABNORMAL HIGH
Tuna IgE: <0.1 kU/L

## 2011-09-03 ENCOUNTER — Telehealth: Payer: Self-pay | Admitting: Internal Medicine

## 2011-09-03 NOTE — Telephone Encounter (Signed)
Spoke with pt. She states that her rash and itching is not improving at all since the last visit. Wants further recs and understands that CDY is off this pm and is fine waiting until the am for recs. She is aware not all labs back yet. I advised her to seek emergent care sooner if needed and she verbalized understanding.

## 2011-09-04 MED ORDER — PREDNISONE 20 MG PO TABS: 20.0000 mg | ORAL_TABLET | Freq: Every day | ORAL | Status: AC

## 2011-09-04 NOTE — Telephone Encounter (Signed)
Per Dr Maple Hudson - Prednisone 20 mg once daily # 15 - Change Allegra to Zyrtec one QHS - Pt informed

## 2011-09-05 NOTE — Progress Notes (Signed)
Quick Note:  LMTCB ______ 

## 2011-09-06 DIAGNOSIS — R21 Rash and other nonspecific skin eruption: Secondary | ICD-10-CM | POA: Insufficient documentation

## 2011-09-06 NOTE — Assessment & Plan Note (Signed)
Not evident on exam today but this suggests at least some potential for IgE mediated atopic allergy.

## 2011-09-06 NOTE — Assessment & Plan Note (Signed)
It sounds as if she may be describing urticaria but she has no visible lesions at this visit despite describing her rash is persistent virtually every day. I cannot exclude a drug rash or some other process. Plan-allergy profiles for IgE status-environmental and food. IgE for alpha-Gal

## 2011-09-25 ENCOUNTER — Encounter: Payer: BC Managed Care – PPO | Admitting: Physical Medicine and Rehabilitation

## 2011-09-26 ENCOUNTER — Encounter
Payer: BC Managed Care – PPO | Attending: Physical Medicine and Rehabilitation | Admitting: Physical Medicine and Rehabilitation

## 2011-09-29 ENCOUNTER — Encounter: Payer: Self-pay | Admitting: Physical Medicine and Rehabilitation

## 2011-09-29 ENCOUNTER — Encounter
Payer: BC Managed Care – PPO | Attending: Physical Medicine and Rehabilitation | Admitting: Physical Medicine and Rehabilitation

## 2011-09-29 ENCOUNTER — Other Ambulatory Visit: Payer: Self-pay | Admitting: *Deleted

## 2011-09-29 VITALS — BP 131/73 | HR 88 | Resp 16 | Ht 67.0 in | Wt 258.0 lb

## 2011-09-29 DIAGNOSIS — M79609 Pain in unspecified limb: Secondary | ICD-10-CM | POA: Insufficient documentation

## 2011-09-29 DIAGNOSIS — M545 Low back pain, unspecified: Secondary | ICD-10-CM | POA: Insufficient documentation

## 2011-09-29 DIAGNOSIS — M961 Postlaminectomy syndrome, not elsewhere classified: Secondary | ICD-10-CM | POA: Insufficient documentation

## 2011-09-29 DIAGNOSIS — G8929 Other chronic pain: Secondary | ICD-10-CM | POA: Insufficient documentation

## 2011-09-29 DIAGNOSIS — M47817 Spondylosis without myelopathy or radiculopathy, lumbosacral region: Secondary | ICD-10-CM | POA: Insufficient documentation

## 2011-09-29 MED ORDER — HYDROCODONE-ACETAMINOPHEN 10-325 MG PO TABS: 1.0000 | ORAL_TABLET | Freq: Four times a day (QID) | ORAL | Status: DC

## 2011-09-29 MED ORDER — AMPHETAMINE-DEXTROAMPHETAMINE 20 MG PO TABS: 20.0000 mg | ORAL_TABLET | Freq: Two times a day (BID) | ORAL | Status: DC

## 2011-09-29 NOTE — Patient Instructions (Signed)
Continue with walking program and exercises .

## 2011-09-29 NOTE — Telephone Encounter (Signed)
Pt called requesting refill of Adderall-last written 08/19/2011 #60 with 0 refills.

## 2011-09-29 NOTE — Progress Notes (Signed)
Subjective:    Patient ID: Tina [name], female    DOB: 1980/12/09, 31 y.o.   MRN: [id number]  HPI The patient is a 31 year old female female, who presents with right leg pain and LBP . The symptoms started 5 years ago, after an accident at her work place, after lifting a heavy object. Hx of discectomy, by Dr. Jillyn Hidden Nov. 2009, not much improvement after the surgery. The patient complains about moderate pain , which radiate to the right LE, posteriorly into the thigh.  Patient also denies numbness and tingling.  She describes the pain as a sharp shooting, sometimes burning pain. Taking medications , changing positions alleviate the symptoms. Prolonged standing and sitting aggrevates the symptoms. The patient grades his pain as a 5 /10. Pain Inventory Average Pain 8 Pain Right Now 7 My pain is sharp, burning and stabbing  In the last 24 hours, has pain interfered with the following? General activity 5 Relation with others 5 Enjoyment of life 6 What TIME of day is your pain at its worst? morning Sleep (in general) Fair  Pain is worse with: walking, bending, standing and some activites Pain improves with: rest, therapy/exercise and medication Relief from Meds: 7  Mobility walk without assistance ability to climb steps?  yes do you drive?  yes  Function employed # of hrs/week 32-40  Neuro/Psych No problems in this area  Prior Studies Any changes since last visit?  no  Physicians involved in your care Any changes since last visit?  no   Family History  Problem Relation Age of Onset  . Depression Mother   . Diabetes Father   . Hypertension Father   . Diabetes Maternal Grandmother     Dialysis  . Hypertension Maternal Grandmother   . Cancer Paternal Grandmother     Breast Cancer  . Cancer Other     colon  . Kidney failure Other     dialysis  . Cancer Other     colon  . Heart disease Maternal Grandmother    History   Social History  . Marital Status: Single   Spouse Name: N/A    Number of Children: 1  . Years of Education: N/A   Occupational History  . Scientist, forensic  . part time student [employer]    Social History Main Topics  . Smoking status: Former Smoker -- 0.3 packs/day    Types: Cigarettes    Quit date: 08/01/2011  . Smokeless tobacco: Never Used  . Alcohol Use: No  . Drug Use: No  . Sexually Active: None   Other Topics Concern  . None   Social History Narrative   Also raises 29 yo nephew   Past Surgical History  Procedure Date  . Cesarean section   . Tonsillectomy   . Back surgery 11/09   Past Medical History  Diagnosis Date  . LUMBAR RADICULOPATHY, RIGHT   . HYPERLIPIDEMIA   . ANXIETY   . DEPRESSION   . ADD   . COMMON MIGRAINE   . GERD   . CHOLELITHIASIS   . Pruritus ani   . Unspecified backache   . HYPERSOMNIA   . SMOKER   . 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 Allergic rhinitis, cause unspecified 05/08/2011   BP 131/73  Pulse 88  Resp 16  Ht 5\' 7"  (1.702 m)  Wt 258 lb (117.028 kg)  BMI 40.41 kg/m2  SpO2 99%     Review of Systems  Constitutional: Negative.  HENT: Negative.   Eyes: Negative.   Respiratory: Negative.   Cardiovascular: Negative.   Gastrointestinal: Negative.   Genitourinary: Negative.   Musculoskeletal: Positive for back pain.  Skin: Negative.   Neurological: Negative.   Hematological: Negative.   Psychiatric/Behavioral: Negative.        Objective:   Physical Exam  Constitutional: She is oriented to person, place, and time. She appears well-developed and well-nourished.       Obese  HENT:  Head: Normocephalic.  Neck: Neck supple.  Musculoskeletal: She exhibits tenderness.  Neurological: She is alert and oriented to person, place, and time.  Skin: Skin is warm and dry.  Psychiatric: She has a normal mood and affect.    Symmetric normal motor tone is noted throughout. Normal muscle bulk. Muscle testing reveals 5/5 muscle strength of the upper  extremity, and 5/5 of the lower extremity. Full range of motion in upper and lower extremities. ROM of spine is restricted. Fine motor movements are normal in both hands. Sensory is intact and symmetric to light touch, pinprick and proprioception. DTR in the upper and lower extremity are present and symmetric 2+. No clonus is noted.  Patient arises from chair without difficulty. Narrow based gait with normal arm swing bilateral .        Assessment & Plan:  1. Lumbosacral spondylosis with chronic pain syndrome. She has not responded positively to medial branch blocks. She's had minor relief with medial branch blocks. 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 I encouraged aquatic exercise with or without walking. Over time the goal is to reduce her pain medications. Discussed this with the patient she has agreed with the plan. Refilled Hydrocodone. Also encouraged patient to loose some weight, advised her to cut down on her sodas, and try to work out regulary, I recommended walking on the eliptical regulary.  Follow up with PA in 3 months

## 2011-09-29 NOTE — Telephone Encounter (Signed)
Done hardcopy to [facility]  

## 2011-09-30 NOTE — Telephone Encounter (Signed)
Called the patient left detailed message that prescription requested is ready for pickup at the front desk. 

## 2011-10-15 ENCOUNTER — Telehealth: Payer: Self-pay

## 2011-10-15 MED ORDER — SUMATRIPTAN SUCCINATE 100 MG PO TABS: 100.0000 mg | ORAL_TABLET | ORAL | Status: DC | PRN

## 2011-10-15 NOTE — Telephone Encounter (Signed)
Pt called stating that Maxalt 10 mg has not helped with migraine sxs, pt is requesting alternative medication.

## 2011-10-15 NOTE — Telephone Encounter (Signed)
Ok to try change to imitrex- done erx

## 2011-10-16 NOTE — Telephone Encounter (Signed)
Patient informed of change. 

## 2011-10-17 ENCOUNTER — Ambulatory Visit (INDEPENDENT_AMBULATORY_CARE_PROVIDER_SITE_OTHER): Payer: BC Managed Care – PPO | Admitting: Internal Medicine

## 2011-10-17 ENCOUNTER — Other Ambulatory Visit (INDEPENDENT_AMBULATORY_CARE_PROVIDER_SITE_OTHER): Payer: BC Managed Care – PPO

## 2011-10-17 ENCOUNTER — Telehealth: Payer: Self-pay | Admitting: Internal Medicine

## 2011-10-17 ENCOUNTER — Encounter: Payer: Self-pay | Admitting: Internal Medicine

## 2011-10-17 VITALS — BP 122/88 | HR 80 | Ht 67.0 in | Wt 259.8 lb

## 2011-10-17 DIAGNOSIS — L508 Other urticaria: Secondary | ICD-10-CM

## 2011-10-17 DIAGNOSIS — D71 Functional disorders of polymorphonuclear neutrophils: Secondary | ICD-10-CM

## 2011-10-17 DIAGNOSIS — D824 Hyperimmunoglobulin E [IgE] syndrome: Secondary | ICD-10-CM

## 2011-10-17 DIAGNOSIS — R21 Rash and other nonspecific skin eruption: Secondary | ICD-10-CM

## 2011-10-17 LAB — SEDIMENTATION RATE: Sed Rate: 17 mm/hr (ref 0–22)

## 2011-10-17 MED ORDER — DOXEPIN HCL 6 MG PO TABS: 1.0000 | ORAL_TABLET | Freq: Every evening | ORAL | Status: DC | PRN

## 2011-10-17 MED ORDER — PREDNISONE 10 MG PO TABS: 10.0000 mg | ORAL_TABLET | Freq: Every day | ORAL | Status: DC

## 2011-10-17 MED ORDER — DOXEPIN HCL 10 MG PO CAPS: 10.0000 mg | ORAL_CAPSULE | Freq: Every day | ORAL | Status: DC

## 2011-10-17 NOTE — Progress Notes (Signed)
08/29/11- 31yo F former smoker seen for allergy evaluation because of hives.  PCP Dr Jonny Ruiz She describes severe itching and rash which began in September of 2012, present almost every day since then. She had no similar problem before. She has taken high doses of Benadryl without benefit. In August of 2012 she had moved into an old trailer and rash was worse while living there. It improved with she moved away but did not stop completely. They did keep a few pieces of furniture which transferred to the new home but she is planning to get wears those now. No obvious association with any exposure or environment otherwise. She has tried switching to hypoallergenic detergents etc. She gives history that Percocet causes itching. She denies any hormone therapy, liver or thyroid disease or other systemic illness. Denies pregnancy. No history of seasonal allergy, indoor pets or food intolerance. Environmental-living in a mobile home with no basement and no obvious mold. She recently quit smoking. Works at [employer]. No family history of urticaria.  10/17/11- 31yo F former smoker seen for allergy evaluation because of hives.  PCP Dr Jonny Ruiz Pt states no change since last OV. pt c/o itching, rash, and blisters all over. Review bloodwork Lab did not process sample for alpha-gal IgE Still complains of small red scattered spots" itching all over" only relieved by Benadryl, not by prednisone or Atarax.. No obvious trigger and no pattern. Taking adderall occasionally. Followed at the pain clinic on Norco. Allergy Profile 08/29/2011-total IgE markedly elevated 1965.8 with broad elevations of all specific allergens on the panel, likely secondary to the high total IgE. She couldn't afford a dermatologist on previous referral. ANA - negative.  Sed rate 17  ROS-see HPI Constitutional:   No-   weight loss, night sweats, fevers, chills, fatigue, lassitude. HEENT:   No-  headaches, difficulty swallowing, tooth/dental problems, sore  throat,       +  sneezing, itching, ear ache, nasal congestion, post nasal drip,  CV:  No-   chest pain, orthopnea, PND, swelling in lower extremities, anasarca, dizziness, palpitations Resp: No-   shortness of breath with exertion or at rest.              No-   productive cough,  No non-productive cough,  No- coughing up of blood.              No-   change in color of mucus.  No- wheezing.   Skin: + per HPI. GI:  +  heartburn, indigestion,  No-abdominal pain, nausea, vomiting, diarrhea,                 change in bowel habits, loss of appetite GU: No-   dysuria, change in color of urine, no urgency or frequency.  No- flank pain. MS:  No-   joint pain or swelling. Neuro-     nothing unusual Psych:  No- change in mood or affect. No depression or anxiety.  No memory loss.  OBJ- Physical Exam General- Alert, Oriented, Affect-appropriate, Distress- none acute, obese Skin- rash-none, lesions- none, excoriation- none. + Very minimal scattered pink dots suggestive of insect bites, without excoriation. Lymphadenopathy- none Head- atraumatic            Eyes- Gross vision intact, PERRLA, conjunctivae and secretions clear            Ears- Hearing, canals-normal            Nose- Clear, no-Septal dev, mucus, polyps, erosion, perforation  Throat- Mallampati II , mucosa clear , drainage- none, tonsils- atrophic Neck- flexible , trachea midline, no stridor , thyroid nl, carotid no bruit Chest - symmetrical excursion , unlabored           Heart/CV- RRR , no murmur , no gallop  , no rub, nl s1 s2                           - JVD- none , edema- none, stasis changes- none, varices- none           Lung- clear to P&A, wheeze- none, cough- none , dullness-none, rub- none           Chest wall-  Abd-  Br/ Gen/ Rectal- Not done, not indicated Extrem- cyanosis- none, clubbing, none, atrophy- none, strength- nl Neuro- grossly intact to observation

## 2011-10-17 NOTE — Telephone Encounter (Signed)
Called [facility] spoke with pharmacist Marcelino Duster who stated that the doxepin (generic) is not available in 6mg  > only available in 10mg , 25mg , 50mg , 75mg ,, 100mg , 150mg .  The brand name silenor is available in 6mg  but will cost the patient $191.68 for copay.    Per CY: okay for generic silenor 10mg  qhs #30 prn refills.  Verbal order given to [facility] for doxepin 10mg  tabs #30 1 tab qhs with prn refills.  MAR updated.  Will sign off.

## 2011-10-17 NOTE — Telephone Encounter (Signed)
Tina [name] already took one msg re: this so I will close the msg I took. Tina [name]

## 2011-10-17 NOTE — Patient Instructions (Addendum)
We will ask Dr Jonny Ruiz if he does punch skin biopsies  Script for doxepin for itching  Order- lab- ANA, Sed rate     Dx rash  Script for doxepin    For itching  Script for prednisone to take 1 daily  Consider trying to soothe skin-     Aveeno or Eucerin   otc

## 2011-10-17 NOTE — Telephone Encounter (Signed)
Duplicate msg. Kathleen W Perdue  °

## 2011-10-26 DIAGNOSIS — D824 Hyperimmunoglobulin E [IgE] syndrome: Secondary | ICD-10-CM | POA: Insufficient documentation

## 2011-10-26 NOTE — Assessment & Plan Note (Addendum)
What I am seeing now is very unimpressive, small pink punctate areas, one on each arm. I would think first of insect bites like fleas. Despite her complaint of itching, there is little excoriation. 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 Plan-soothing skin lotion, prednisone. Since she can't afford a dermatologist, question if her primary physician is able to do a punch skin biopsy of one of the lesions once she is off of prednisone? Try xyzal, doxepin.

## 2011-10-26 NOTE — Assessment & Plan Note (Signed)
Differential diagnosis is broad. Allergy to medication is possible. Plan-C3 can suppress this with prednisone

## 2011-10-29 ENCOUNTER — Telehealth: Payer: Self-pay

## 2011-10-29 NOTE — Progress Notes (Signed)
Quick Note:  Pt aware of results. ______ 

## 2011-10-29 NOTE — Telephone Encounter (Signed)
Refill norco

## 2011-10-30 ENCOUNTER — Other Ambulatory Visit: Payer: Self-pay

## 2011-10-30 MED ORDER — HYDROCODONE-ACETAMINOPHEN 10-325 MG PO TABS: 1.0000 | ORAL_TABLET | Freq: Four times a day (QID) | ORAL | Status: DC

## 2011-10-30 MED ORDER — AMPHETAMINE-DEXTROAMPHETAMINE 20 MG PO TABS: 20.0000 mg | ORAL_TABLET | Freq: Two times a day (BID) | ORAL | Status: DC

## 2011-10-30 NOTE — Telephone Encounter (Signed)
Called left detailed message on both numbers that prescription is ready for pickup at the front desk.

## 2011-10-30 NOTE — Telephone Encounter (Signed)
This has been called in, pt aware.

## 2011-10-30 NOTE — Telephone Encounter (Signed)
Done hardcopy to [facility]  

## 2011-11-28 ENCOUNTER — Telehealth: Payer: Self-pay | Admitting: *Deleted

## 2011-11-28 MED ORDER — HYDROCODONE-ACETAMINOPHEN 10-325 MG PO TABS: 1.0000 | ORAL_TABLET | Freq: Four times a day (QID) | ORAL | Status: DC

## 2011-11-28 NOTE — Telephone Encounter (Signed)
Refill Norco.  Refilled and pt notified by VM

## 2011-12-01 ENCOUNTER — Other Ambulatory Visit: Payer: Self-pay | Admitting: *Deleted

## 2011-12-01 MED ORDER — AMPHETAMINE-DEXTROAMPHETAMINE 20 MG PO TABS: 20.0000 mg | ORAL_TABLET | Freq: Two times a day (BID) | ORAL | Status: DC

## 2011-12-01 NOTE — Telephone Encounter (Signed)
Done hardcopy to [facility]  

## 2011-12-01 NOTE — Telephone Encounter (Signed)
Pt requesting refill of Adderall-last written 10/30/2011 #60 with 0 refills-please advise.

## 2011-12-01 NOTE — Telephone Encounter (Signed)
Called the patient left detailed message that prescription requested is ready for pickup at the front desk. 

## 2011-12-02 ENCOUNTER — Ambulatory Visit (INDEPENDENT_AMBULATORY_CARE_PROVIDER_SITE_OTHER): Payer: BC Managed Care – PPO | Admitting: Internal Medicine

## 2011-12-02 ENCOUNTER — Encounter: Payer: Self-pay | Admitting: Internal Medicine

## 2011-12-02 VITALS — BP 102/58 | HR 66 | Temp 97.6°F | Ht 67.0 in | Wt 258.5 lb

## 2011-12-02 DIAGNOSIS — J309 Allergic rhinitis, unspecified: Secondary | ICD-10-CM

## 2011-12-02 DIAGNOSIS — F411 Generalized anxiety disorder: Secondary | ICD-10-CM

## 2011-12-02 DIAGNOSIS — J3089 Other allergic rhinitis: Secondary | ICD-10-CM

## 2011-12-02 DIAGNOSIS — J069 Acute upper respiratory infection, unspecified: Secondary | ICD-10-CM

## 2011-12-02 MED ORDER — AZITHROMYCIN 250 MG PO TABS
ORAL_TABLET | ORAL | Status: DC
Start: 2011-12-02 — End: 2012-01-11

## 2011-12-02 NOTE — Progress Notes (Signed)
Subjective:    Patient ID: Tina [name], female    DOB: [birth year], 31 y.o.   MRN: [id number]  HPI  Here with feverish, nausea, fatigue, dizziness, and URI symptoms for 3 days;  Has mild facial pain, pressure, general weakness and malaise, with mild ST, but little to no cough and Pt denies chest pain, increased sob or doe, wheezing, orthopnea, PND, increased LE swelling, palpitations, dizziness or syncope.  Pt denies new neurological symptoms such as new headache, or facial or extremity weakness or numbness   Pt denies polydipsia, polyuria,  Denies worsening depressive symptoms, suicidal ideation, or panic, though has ongoing anxiety, not increased recently.  Does have several wks ongoing nasal allergy symptoms with clear congestion, itch and sneeze, without fever, pain, ST, cough or wheezing. Past Medical History  Diagnosis Date  . LUMBAR RADICULOPATHY, RIGHT   . HYPERLIPIDEMIA   . ANXIETY   . DEPRESSION   . ADD   . COMMON MIGRAINE   . GERD   . CHOLELITHIASIS   . Pruritus ani   . Unspecified backache   . HYPERSOMNIA   . SMOKER   . 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 Allergic rhinitis, cause unspecified 05/08/2011   Past Surgical History  Procedure Date  . Cesarean section   . Tonsillectomy   . Back surgery 11/09    reports that she has been smoking Cigarettes.  She has been smoking about .5 packs per day. She has never used smokeless tobacco. She reports that she does not drink alcohol or use illicit drugs. family history includes Cancer in her others and paternal grandmother; Depression in her mother; Diabetes in her father and maternal grandmother; Heart disease in her maternal grandmother; Hypertension in her father and maternal grandmother; and Kidney failure in her other. Allergies  Allergen Reactions  . Neurontin (Gabapentin) Other (See Comments)    Patient states she is not intolerant or allergic to neurontin  . Percocet (Oxycodone-Acetaminophen)  Itching    SEVERE ITCHING  . Tramadol     MIGRAINE   Current Outpatient Prescriptions on File Prior to Visit  Medication Sig Dispense Refill  . amphetamine-dextroamphetamine (ADDERALL) 20 MG tablet Take 1 tablet (20 mg total) by mouth 2 (two) times daily.  60 tablet  0  . doxepin (SINEQUAN) 10 MG capsule Take 1 capsule (10 mg total) by mouth at bedtime.  30 capsule  PRN  . HYDROcodone-acetaminophen (NORCO) 10-325 MG per tablet Take 1 tablet by mouth 4 (four) times daily.  120 tablet  0  . omeprazole (PRILOSEC) 20 MG capsule Take 20 mg by mouth daily.        . predniSONE (DELTASONE) 10 MG tablet Take 1 tablet (10 mg total) by mouth daily.  30 tablet  0  . SUMAtriptan (IMITREX) 100 MG tablet Take 1 tablet (100 mg total) by mouth every 2 (two) hours as needed for migraine.  10 tablet  5   Review of Systems  Constitutional: Negative for diaphoresis and unexpected weight change.  HENT: Negative for tinnitus.   Eyes: Negative for photophobia and visual disturbance.  Respiratory: Negative for choking and stridor.   Gastrointestinal: Negative for vomiting and blood in stool.  Genitourinary: Negative for hematuria and decreased urine volume.  Musculoskeletal: Negative for gait problem.  Skin: Negative for color change and wound.  Neurological: Negative for tremors and numbness.  Psychiatric/Behavioral: Negative for decreased concentration. The patient is not hyperactive.       Objective:   Physical Exam BP  102/58  Pulse 66  Temp 97.6 F (36.4 C) (Oral)  Ht 5\' 7"  (1.702 m)  Wt 258 lb 8 oz (117.255 kg)  BMI 40.49 kg/m2  SpO2 97% Physical Exam  VS noted, mild ill Constitutional: Pt appears well-developed and well-nourished.  HENT: Head: Normocephalic.  Right Ear: External ear normal.  Left Ear: External ear normal.  Bilat tm's mild erythema.  Sinus nontender.  Pharynx mild erythema Eyes: Conjunctivae and EOM are normal. Pupils are equal, round, and reactive to light.  Neck: Normal  range of motion. Neck supple.  Cardiovascular: Normal rate and regular rhythm.   Pulmonary/Chest: Effort normal and breath sounds normal.  Neurological: Pt is alert. Not confused  Skin: Skin is warm. No erythema.  Psychiatric: Pt behavior is normal. Thought content normal. 1+ nervous    Assessment & Plan:

## 2011-12-02 NOTE — Assessment & Plan Note (Addendum)
stable overall by hx and exam, most recent data reviewed with pt, and pt to continue medical treatment as before Lab Results  Component Value Date   WBC 7.1 08/29/2011   HGB 13.4 08/29/2011   HCT 41.0 08/29/2011   PLT 222.0 08/29/2011   GLUCOSE 77 11/20/2010   CHOL 221* 02/08/2010   TRIG 218.0* 02/08/2010   HDL 46.80 02/08/2010   LDLDIRECT 149.6 02/08/2010   ALT 15 11/20/2010   AST 15 11/20/2010   NA 140 11/20/2010   K 4.3 11/20/2010   CL 103 11/20/2010   CREATININE 0.6 11/20/2010   BUN 11 11/20/2010   CO2 31 11/20/2010   TSH 0.96 11/20/2010   INR 0.9 11/17/2007

## 2011-12-02 NOTE — Patient Instructions (Addendum)
Take all new medications as prescribed Continue all other medications as before You can also take Mucinex (or it's generic off brand) for congestion, and allegra for allergies as needed

## 2011-12-02 NOTE — Assessment & Plan Note (Signed)
Mild to mod, for otc allegra prn, to f/u any worsening symptoms or concerns 

## 2011-12-02 NOTE — Assessment & Plan Note (Signed)
Mild to mod, for antibx course,  to f/u any worsening symptoms or concerns 

## 2011-12-19 ENCOUNTER — Ambulatory Visit: Payer: BC Managed Care – PPO | Admitting: Internal Medicine

## 2011-12-26 ENCOUNTER — Telehealth: Payer: Self-pay | Admitting: *Deleted

## 2011-12-26 MED ORDER — HYDROCODONE-ACETAMINOPHEN 10-325 MG PO TABS: 1.0000 | ORAL_TABLET | Freq: Four times a day (QID) | ORAL | Status: DC

## 2011-12-26 NOTE — Telephone Encounter (Signed)
Patient needs refill on Norco on [facility] on [address]

## 2011-12-29 ENCOUNTER — Ambulatory Visit: Payer: BC Managed Care – PPO | Admitting: Physical Medicine and Rehabilitation

## 2011-12-29 ENCOUNTER — Telehealth: Payer: Self-pay | Admitting: *Deleted

## 2011-12-29 MED ORDER — AMPHETAMINE-DEXTROAMPHETAMINE 20 MG PO TABS: 20.0000 mg | ORAL_TABLET | Freq: Two times a day (BID) | ORAL | Status: DC

## 2011-12-29 NOTE — Telephone Encounter (Signed)
Pt requesting refill of Adderall-last written 12/01/2011 #60 with 0 refills-please advise.

## 2011-12-29 NOTE — Telephone Encounter (Signed)
Done hardcopy to [facility]  

## 2011-12-30 NOTE — Telephone Encounter (Signed)
Called the patient informed to pickup hardcopy at the front desk. 

## 2012-01-01 ENCOUNTER — Encounter
Payer: BC Managed Care – PPO | Attending: Physical Medicine and Rehabilitation | Admitting: Physical Medicine and Rehabilitation

## 2012-01-01 ENCOUNTER — Encounter: Payer: Self-pay | Admitting: Physical Medicine and Rehabilitation

## 2012-01-01 VITALS — BP 131/76 | HR 84 | Resp 16 | Ht 67.0 in | Wt 263.0 lb

## 2012-01-01 DIAGNOSIS — M545 Low back pain, unspecified: Secondary | ICD-10-CM | POA: Insufficient documentation

## 2012-01-01 DIAGNOSIS — M47817 Spondylosis without myelopathy or radiculopathy, lumbosacral region: Secondary | ICD-10-CM | POA: Insufficient documentation

## 2012-01-01 DIAGNOSIS — M79609 Pain in unspecified limb: Secondary | ICD-10-CM | POA: Insufficient documentation

## 2012-01-01 DIAGNOSIS — K219 Gastro-esophageal reflux disease without esophagitis: Secondary | ICD-10-CM | POA: Insufficient documentation

## 2012-01-01 DIAGNOSIS — M961 Postlaminectomy syndrome, not elsewhere classified: Secondary | ICD-10-CM

## 2012-01-01 DIAGNOSIS — F17200 Nicotine dependence, unspecified, uncomplicated: Secondary | ICD-10-CM | POA: Insufficient documentation

## 2012-01-01 DIAGNOSIS — Z5181 Encounter for therapeutic drug level monitoring: Secondary | ICD-10-CM

## 2012-01-01 DIAGNOSIS — G894 Chronic pain syndrome: Secondary | ICD-10-CM | POA: Insufficient documentation

## 2012-01-01 DIAGNOSIS — E785 Hyperlipidemia, unspecified: Secondary | ICD-10-CM | POA: Insufficient documentation

## 2012-01-01 NOTE — Patient Instructions (Signed)
Continue with your walking and exercise program as pain permits 

## 2012-01-01 NOTE — Progress Notes (Signed)
Subjective:    Patient ID: Tina [name], female    DOB: 1980-02-01, 31 y.o.   MRN: [id number]  HPI The patient is a 31 year old female female, who presents with right leg pain and LBP . The symptoms started 5 years ago, after an accident at her work place, after lifting a heavy object. Hx of discectomy, by Dr. Jillyn Hidden Nov. 2009, not much improvement after the surgery. The patient complains about moderate pain , which radiate to the right LE, posteriorly into the thigh. Patient also denies numbness and tingling. She describes the pain as a sharp shooting, sometimes burning pain. Taking medications , changing positions alleviate the symptoms. Prolonged standing and sitting aggrevates the symptoms. The patient grades his pain as a 6 /10.  Pain Inventory Average Pain 8 Pain Right Now 6 My pain is sharp, burning, stabbing and aching  In the last 24 hours, has pain interfered with the following? General activity 7 Relation with others 7 Enjoyment of life 7 What TIME of day is your pain at its worst? morning, evening and night Sleep (in general) Fair  Pain is worse with: walking, bending, sitting, standing and some activites Pain improves with: rest and medication Relief from Meds: 7  Mobility walk without assistance how many minutes can you walk? 20-30 ability to climb steps?  yes do you drive?  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  no  Physicians involved in your care Any changes since last visit?  no   Family History  Problem Relation Age of Onset  . Depression Mother   . Diabetes Father   . Hypertension Father   . Diabetes Maternal Grandmother     Dialysis  . Hypertension Maternal Grandmother   . Cancer Paternal Grandmother     Breast Cancer  . Cancer Other     colon  . Kidney  failure Other     dialysis  . Cancer Other     colon  . Heart disease Maternal Grandmother    History   Social History  . Marital Status: Single    Spouse Name: N/A    Number of Children: 1  . Years of Education: N/A   Occupational History  . Scientist, forensic  . part time student [employer]    Social History Main Topics  . Smoking status: Current Every Day Smoker -- 0.5 packs/day    Types: Cigarettes  . Smokeless tobacco: Never Used  . Alcohol Use: No  . Drug Use: No  . Sexually Active: None   Other Topics Concern  . None   Social History Narrative   Also raises 33 yo nephew   Past Surgical History  Procedure Date  . Cesarean section   . Tonsillectomy   . Back surgery 11/09   Past Medical History  Diagnosis Date  . LUMBAR RADICULOPATHY, RIGHT   . HYPERLIPIDEMIA   . ANXIETY   . DEPRESSION   . ADD   . COMMON MIGRAINE   . GERD   . CHOLELITHIASIS   . Pruritus ani   . Unspecified backache   . HYPERSOMNIA   . SMOKER   . 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 Allergic rhinitis, cause unspecified 05/08/2011   BP 131/76  Pulse 84  Resp 16  Ht 5\' 7"  (1.702 m)  Wt 263 lb (119.296 kg)  BMI 41.19 kg/m2  SpO2 100%  LMP 12/28/2011    Review of Systems  Musculoskeletal: Positive for back pain.  All other systems reviewed and are negative.       Objective:   Physical Exam Constitutional: She is oriented to person, place, and time. She appears well-developed and well-nourished.  Obese  HENT:  Head: Normocephalic.  Neck: Neck supple.  Musculoskeletal: She exhibits tenderness.  Neurological: She is alert and oriented to person, place, and time.  Skin: Skin is warm and dry.  Psychiatric: She has a normal mood and affect.   Symmetric normal motor tone is noted throughout. Normal muscle bulk. Muscle testing reveals 5/5 muscle strength of the upper extremity, and 5/5 of the lower extremity. Full range of motion in upper and lower extremities. ROM  of spine is restricted. Fine motor movements are normal in both hands.  Sensory is intact and symmetric to light touch, pinprick and proprioception.  DTR in the upper and lower extremity are present and symmetric 2+. No clonus is noted.  Patient arises from chair without difficulty. Narrow based gait with normal arm swing bilateral .         Assessment & Plan:  1. Lumbosacral spondylosis with chronic pain syndrome. She has not responded positively to medial branch blocks. She's had minor relief with medial branch blocks. 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 I encouraged aquatic exercise with or without walking. Over time the goal is to reduce her pain medications. Discussed this with the patient she has agreed with the plan.  Refilled Hydrocodone. Also encouraged patient to loose some weight, advised her to cut down on her sodas, and try to work out regulary, I recommended walking on the eliptical regulary. Recommended aquatic exercising, to built up strength without putting too much weight on her spine/joints. This would also help to loose weight. Follow up with PA in 3 months

## 2012-01-11 ENCOUNTER — Encounter (HOSPITAL_COMMUNITY): Payer: Self-pay | Admitting: *Deleted

## 2012-01-11 ENCOUNTER — Inpatient Hospital Stay (HOSPITAL_COMMUNITY)
Admission: AD | Admit: 2012-01-11 | Discharge: 2012-01-11 | Disposition: A | Discharge status: Home / Self Care | Payer: BC Managed Care – PPO | Source: Ambulatory Visit | Attending: Obstetrics & Gynecology | Admitting: Obstetrics & Gynecology

## 2012-01-11 DIAGNOSIS — A6000 Herpesviral infection of urogenital system, unspecified: Secondary | ICD-10-CM | POA: Insufficient documentation

## 2012-01-11 DIAGNOSIS — B009 Herpesviral infection, unspecified: Secondary | ICD-10-CM

## 2012-01-11 DIAGNOSIS — N949 Unspecified condition associated with female genital organs and menstrual cycle: Secondary | ICD-10-CM | POA: Insufficient documentation

## 2012-01-11 LAB — URINE MICROSCOPIC-ADD ON

## 2012-01-11 LAB — URINALYSIS, ROUTINE W REFLEX MICROSCOPIC
Bilirubin Urine: NEGATIVE
Nitrite: NEGATIVE
Specific Gravity, Urine: <1.005 — ABNORMAL LOW (ref 1.005–1.030)
Urobilinogen, UA: 0.2 mg/dL (ref 0.0–1.0)

## 2012-01-11 LAB — WET PREP, GENITAL: Yeast Wet Prep HPF POC: NONE SEEN

## 2012-01-11 MED ORDER — LIDOCAINE HCL 2 % EX GEL
Freq: Once | CUTANEOUS | Status: AC
  Administered 2012-01-11: 5 via TOPICAL
  Filled 2012-01-11: qty 20

## 2012-01-11 MED ORDER — VALACYCLOVIR HCL 1 G PO TABS: 1000.0000 mg | ORAL_TABLET | Freq: Two times a day (BID) | ORAL | Status: DC

## 2012-01-11 MED ORDER — VALACYCLOVIR HCL 500 MG PO TABS
1000.0000 mg | ORAL_TABLET | Freq: Once | ORAL | Status: AC
  Administered 2012-01-11: 1000 mg via ORAL
  Filled 2012-01-11: qty 2

## 2012-01-11 NOTE — MAU Note (Signed)
Pt reports having vaginal pain that started on Thursday. "feels like a cut down there" Noticed small amount of bleeding when wiping  A few times. Pt tried some monistat without much relief.

## 2012-01-11 NOTE — MAU Provider Note (Signed)
History     CSN: [id number]  Arrival date and time: 01/11/12 1232   First Provider Initiated Contact with Patient 01/11/12 1325      Chief Complaint  Patient presents with  . Vaginal Pain   HPI  Pt is not pregnant and presents with vaginal pain for 3 days.  She started using  Monistat cream that did not relieve the pain.  The patient states it feels like a cut.  She stopped her period and has not noticed an abnormal discharge.  She had sex 5 days ago without pain.  She denies new soaps or laundry detergent and didn't use scented pads or tampons.  She has been wearing a panty liner. She has pain with urine contact.    Past Medical History  Diagnosis Date  . LUMBAR RADICULOPATHY, RIGHT   . HYPERLIPIDEMIA   . ANXIETY   . DEPRESSION   . ADD   . COMMON MIGRAINE   . GERD   . CHOLELITHIASIS   . Pruritus ani   . Unspecified backache   . HYPERSOMNIA   . SMOKER   . 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 Allergic rhinitis, cause unspecified 05/08/2011    Past Surgical History  Procedure Date  . Cesarean section   . Tonsillectomy   . Back surgery 11/09    Family History  Problem Relation Age of Onset  . Depression Mother   . Diabetes Father   . Hypertension Father   . Diabetes Maternal Grandmother     Dialysis  . Hypertension Maternal Grandmother   . Cancer Paternal Grandmother     Breast Cancer  . Cancer Other     colon  . Kidney failure Other     dialysis  . Cancer Other     colon  . Heart disease Maternal Grandmother     History  Substance Use Topics  . Smoking status: Current Every Day Smoker -- 0.5 packs/day    Types: Cigarettes  . Smokeless tobacco: Never Used  . Alcohol Use: No    Allergies:  Allergies  Allergen Reactions  . Neurontin (Gabapentin) Other (See Comments)    Patient states she is not intolerant or allergic to neurontin  . Percocet (Oxycodone-Acetaminophen) Itching    SEVERE ITCHING  . Tramadol     MIGRAINE     Prescriptions prior to admission  Medication Sig Dispense Refill  . amphetamine-dextroamphetamine (ADDERALL) 20 MG tablet Take 1 tablet (20 mg total) by mouth 2 (two) times daily.  60 tablet  0  . azithromycin (ZITHROMAX Z-PAK) 250 MG tablet Use as directed  6 each  1  . doxepin (SINEQUAN) 10 MG capsule Take 1 capsule (10 mg total) by mouth at bedtime.  30 capsule  PRN  . HYDROcodone-acetaminophen (NORCO) 10-325 MG per tablet Take 1 tablet by mouth 4 (four) times daily.  120 tablet  0  . omeprazole (PRILOSEC) 20 MG capsule Take 20 mg by mouth daily.        . predniSONE (DELTASONE) 10 MG tablet Take 1 tablet (10 mg total) by mouth daily.  30 tablet  0  . SUMAtriptan (IMITREX) 100 MG tablet Take 1 tablet (100 mg total) by mouth every 2 (two) hours as needed for migraine.  10 tablet  5    Review of Systems  Constitutional: Negative for fever and chills.  Genitourinary: Negative for dysuria, urgency and hematuria.  Musculoskeletal: Negative for back pain.  Skin: Negative for itching and rash.   Physical Exam  Blood pressure 99/71, pulse 89, temperature 97.8 F (36.6 C), temperature source Oral, resp. rate 18, height 5\' 7"  (1.702 m), weight 264 lb 6.4 oz (119.931 kg), last menstrual period 12/28/2011.  Physical Exam  Vitals reviewed. Constitutional: She is oriented to person, place, and time. She appears well-developed and well-nourished.  HENT:  Head: Normocephalic.  Eyes: Pupils are equal, round, and reactive to light.  Neck: Neck supple.  Cardiovascular: Normal rate.   Respiratory: Effort normal.  GI: Soft.  Genitourinary:       2 small vesicular lesions at introitus left side, tender to touch; ~74mm painful ulcerated lesion right inner labia 11 oclock- viral culture and wet prep and GC/ Chlamydia cultures obtained- bimanual deferred.  Musculoskeletal: Normal range of motion.  Neurological: She is alert and oriented to person, place, and time.  Skin: Skin is warm and dry.   Psychiatric: She has a normal mood and affect.    MAU Course  Procedures Lidocaine Jelly 2% applied to lesions HSV culture pending GC/Chlamdyia culture pending Results for orders placed during the hospital encounter of 01/11/12 (from the past 24 hour(s))  URINALYSIS, ROUTINE W REFLEX MICROSCOPIC     Status: Abnormal   Collection Time   01/11/12 12:50 PM      Component Value Range   Color, Urine YELLOW  YELLOW   APPearance CLEAR  CLEAR   Specific Gravity, Urine <1.005 (*) 1.005 - 1.030   pH 5.5  5.0 - 8.0   Glucose, UA NEGATIVE  NEGATIVE mg/dL   Hgb urine dipstick SMALL (*) NEGATIVE   Bilirubin Urine NEGATIVE  NEGATIVE   Ketones, ur NEGATIVE  NEGATIVE mg/dL   Protein, ur NEGATIVE  NEGATIVE mg/dL   Urobilinogen, UA 0.2  0.0 - 1.0 mg/dL   Nitrite NEGATIVE  NEGATIVE   Leukocytes, UA SMALL (*) NEGATIVE  URINE MICROSCOPIC-ADD ON     Status: Abnormal   Collection Time   01/11/12 12:50 PM      Component Value Range   Squamous Epithelial / LPF FEW (*) RARE   WBC, UA 7-10  <3 WBC/hpf   RBC / HPF 0-2  <3 RBC/hpf   Bacteria, UA FEW (*) RARE  POCT PREGNANCY, URINE     Status: Normal   Collection Time   01/11/12  1:19 PM      Component Value Range   Preg Test, Ur NEGATIVE  NEGATIVE  WET PREP, GENITAL     Status: Abnormal   Collection Time   01/11/12  1:45 PM      Component Value Range   Yeast Wet Prep HPF POC NONE SEEN  NONE SEEN   Trich, Wet Prep NONE SEEN  NONE SEEN   Clue Cells Wet Prep HPF POC NONE SEEN  NONE SEEN   WBC, Wet Prep HPF POC FEW (*) NONE SEEN   Discussed HSV with pt- 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  Assessment and Plan  HSV- Valtrex 1 gm PO BID  for 10 days then 1 daily for suppression#40 Culture pending  Tawsha Terrero 01/11/2012, 1:27 PM

## 2012-01-12 LAB — URINE CULTURE: Colony Count: 80000

## 2012-01-12 LAB — GC/CHLAMYDIA PROBE AMP: CT Probe RNA: NEGATIVE

## 2012-01-14 LAB — HERPES SIMPLEX VIRUS CULTURE: Special Requests: NORMAL

## 2012-01-26 ENCOUNTER — Telehealth: Payer: Self-pay

## 2012-01-26 MED ORDER — HYDROCODONE-ACETAMINOPHEN 10-325 MG PO TABS: 1.0000 | ORAL_TABLET | Freq: Four times a day (QID) | ORAL | Status: DC

## 2012-01-26 NOTE — Telephone Encounter (Signed)
Refilled and pt notified

## 2012-01-26 NOTE — Telephone Encounter (Signed)
Patient called for refill of norco. She uses [facility].

## 2012-01-30 ENCOUNTER — Telehealth: Payer: Self-pay | Admitting: Internal Medicine

## 2012-01-30 MED ORDER — AMPHETAMINE-DEXTROAMPHETAMINE 20 MG PO TABS: 20.0000 mg | ORAL_TABLET | Freq: Two times a day (BID) | ORAL | Status: DC

## 2012-01-30 NOTE — Telephone Encounter (Signed)
Pt informed rx ready for pickup, placed upfront in cabinet.  

## 2012-01-30 NOTE — Telephone Encounter (Signed)
Done hardcopy to felicia 

## 2012-01-30 NOTE — Telephone Encounter (Signed)
Patient is requesting a refill on her Adderall, please call when ready for pick up

## 2012-03-01 ENCOUNTER — Telehealth: Payer: Self-pay

## 2012-03-01 MED ORDER — HYDROCODONE-ACETAMINOPHEN 10-325 MG PO TABS: 1.0000 | ORAL_TABLET | Freq: Four times a day (QID) | ORAL | Status: DC

## 2012-03-01 NOTE — Telephone Encounter (Signed)
Hydrocodone called into [facility] pharmacy.  Patient aware.

## 2012-03-01 NOTE — Telephone Encounter (Signed)
Needs norco refilled.

## 2012-03-02 ENCOUNTER — Telehealth: Payer: Self-pay | Admitting: *Deleted

## 2012-03-02 NOTE — Telephone Encounter (Signed)
error 

## 2012-03-05 ENCOUNTER — Telehealth: Payer: Self-pay | Admitting: Internal Medicine

## 2012-03-05 MED ORDER — AMPHETAMINE-DEXTROAMPHETAMINE 20 MG PO TABS: 20.0000 mg | ORAL_TABLET | Freq: Two times a day (BID) | ORAL | Status: DC

## 2012-03-05 NOTE — Telephone Encounter (Signed)
Patient informed to pickup hardcopy at the front desk. 

## 2012-03-05 NOTE — Telephone Encounter (Signed)
Done hardcopy to [facility]  

## 2012-03-05 NOTE — Telephone Encounter (Signed)
Patient requesting refill on Adderall, call when ready for pick up °

## 2012-03-29 ENCOUNTER — Encounter: Payer: BC Managed Care – PPO | Admitting: Physical Medicine and Rehabilitation

## 2012-03-29 ENCOUNTER — Telehealth: Payer: Self-pay

## 2012-03-29 ENCOUNTER — Other Ambulatory Visit: Payer: Self-pay | Admitting: Physical Medicine & Rehabilitation

## 2012-03-29 MED ORDER — HYDROCODONE-ACETAMINOPHEN 10-325 MG PO TABS: 1.0000 | ORAL_TABLET | Freq: Four times a day (QID) | ORAL | Status: DC

## 2012-03-29 NOTE — Telephone Encounter (Signed)
Patient called to get hydrocodone refilled.  She had to cancel appointment due to weather.

## 2012-03-29 NOTE — Telephone Encounter (Signed)
Medication was called in  

## 2012-03-31 ENCOUNTER — Encounter: Payer: Self-pay | Admitting: Physical Medicine and Rehabilitation

## 2012-03-31 ENCOUNTER — Encounter
Payer: BC Managed Care – PPO | Attending: Physical Medicine and Rehabilitation | Admitting: Physical Medicine and Rehabilitation

## 2012-03-31 VITALS — BP 132/66 | HR 93 | Resp 14 | Ht 67.0 in | Wt 272.0 lb

## 2012-03-31 DIAGNOSIS — K219 Gastro-esophageal reflux disease without esophagitis: Secondary | ICD-10-CM | POA: Insufficient documentation

## 2012-03-31 DIAGNOSIS — M47817 Spondylosis without myelopathy or radiculopathy, lumbosacral region: Secondary | ICD-10-CM | POA: Insufficient documentation

## 2012-03-31 DIAGNOSIS — M545 Low back pain, unspecified: Secondary | ICD-10-CM | POA: Insufficient documentation

## 2012-03-31 DIAGNOSIS — G894 Chronic pain syndrome: Secondary | ICD-10-CM | POA: Insufficient documentation

## 2012-03-31 DIAGNOSIS — E785 Hyperlipidemia, unspecified: Secondary | ICD-10-CM | POA: Insufficient documentation

## 2012-03-31 DIAGNOSIS — M79609 Pain in unspecified limb: Secondary | ICD-10-CM | POA: Insufficient documentation

## 2012-03-31 DIAGNOSIS — M961 Postlaminectomy syndrome, not elsewhere classified: Secondary | ICD-10-CM

## 2012-03-31 DIAGNOSIS — F17200 Nicotine dependence, unspecified, uncomplicated: Secondary | ICD-10-CM | POA: Insufficient documentation

## 2012-03-31 NOTE — Progress Notes (Signed)
Subjective:    Patient ID: Tina [name], female    DOB: Mar 06, 1980, 32 y.o.   MRN: [id number]  HPI The patient is a 32 year old female female, who presents with right leg pain and LBP . The symptoms started 5 years ago, after an accident at her work place, after lifting a heavy object. Hx of discectomy, by Dr. Jillyn Hidden Nov. 2009, not much improvement after the surgery. The patient complains about moderate pain , which radiate to the right LE, posteriorly into the thigh. Patient also denies numbness and tingling. She describes the pain as a sharp shooting, sometimes burning pain. Taking medications , changing positions alleviate the symptoms. Prolonged standing and sitting aggrevates the symptoms. The patient grades his pain as a 6 /10.  Pain Inventory Average Pain 8 Pain Right Now 6 My pain is sharp, burning, stabbing and aching  In the last 24 hours, has pain interfered with the following? General activity 4 Relation with others 4 Enjoyment of life 5 What TIME of day is your pain at its worst? morning, evening and night Sleep (in general) Fair  Pain is worse with: walking, bending, sitting, standing and some activites Pain improves with: rest and medication Relief from Meds: 7  Mobility walk without assistance ability to climb steps?  yes do you drive?  yes Do you have any goals in this area?  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  no  Physicians involved in your care Any changes since last visit?  no   Family History  Problem Relation Age of Onset  . Depression Mother   . Diabetes Father   . Hypertension Father   . Diabetes Maternal Grandmother     Dialysis  . Hypertension Maternal Grandmother   . Cancer Paternal Grandmother     Breast Cancer  . Cancer Other     colon  . Kidney failure Other     dialysis  . Cancer Other     colon  . Heart disease Maternal  Grandmother    History   Social History  . Marital Status: Single    Spouse Name: N/A    Number of Children: 1  . Years of Education: N/A   Occupational History  . Scientist, forensic  . part time student [employer]    Social History Main Topics  . Smoking status: Current Every Day Smoker -- 0.50 packs/day    Types: Cigarettes  . Smokeless tobacco: Never Used  . Alcohol Use: No  . Drug Use: No  . Sexually Active: None   Other Topics Concern  . None   Social History Narrative   Also raises 48 yo nephew   Past Surgical History  Procedure Laterality Date  . Cesarean section    . Tonsillectomy    . Back surgery  11/09   Past Medical History  Diagnosis Date  . LUMBAR RADICULOPATHY, RIGHT   . HYPERLIPIDEMIA   . ANXIETY   . DEPRESSION   . ADD   . COMMON MIGRAINE   . GERD   . CHOLELITHIASIS   . Pruritus ani   . Unspecified backache   . HYPERSOMNIA   . SMOKER   . 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 Allergic rhinitis, cause unspecified 05/08/2011   BP 132/66  Pulse 93  Resp 14  Ht 5\' 7"  (1.702 m)  Wt 272 lb (123.378 kg)  BMI 42.59  kg/m2  SpO2 99%  LMP 03/08/2012     Review of Systems  Musculoskeletal: Positive for back pain.  All other systems reviewed and are negative.       Objective:   Physical Exam Constitutional: She is oriented to person, place, and time. She appears well-developed and well-nourished.  Obese  HENT:  Head: Normocephalic.  Neck: Neck supple.  Musculoskeletal: She exhibits tenderness.  Neurological: She is alert and oriented to person, place, and time.  Skin: Skin is warm and dry.  Psychiatric: She has a normal mood and affect.  Symmetric normal motor tone is noted throughout. Normal muscle bulk. Muscle testing reveals 5/5 muscle strength of the upper extremity, and 5/5 of the lower extremity. Full range of motion in upper and lower extremities. ROM of spine is restricted. Fine motor movements are normal in both  hands.  Sensory is intact and symmetric to light touch, pinprick and proprioception.  DTR in the upper and lower extremity are present and symmetric 2+. No clonus is noted.  Patient arises from chair without difficulty. Narrow based gait with normal arm swing bilateral .         Assessment & Plan:  1. Lumbosacral spondylosis with chronic pain syndrome. She has not responded positively to medial branch blocks. She's had minor relief with medial branch blocks. 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 I encouraged aquatic exercise with or without walking. Over time the goal is to reduce her pain medications. Discussed this with the patient she has agreed with the plan.  Refilled Hydrocodone. Also encouraged patient to loose some weight, advised her to cut down on her sodas, and try to work out regulary, I recommended walking on the eliptical regulary. Recommended aquatic exercising, to built up strength without putting too much weight on her spine/joints. This would also help to loose weight. Patient tried to loose weight is eating very little and gained 10 lbs, since her last visit. I advised her to follow up with her PCP and test her thyroid function. Follow up with PA in 3 months

## 2012-03-31 NOTE — Patient Instructions (Signed)
Continue with your walking program, try to look into aquatic exercising.

## 2012-04-08 ENCOUNTER — Other Ambulatory Visit: Payer: Self-pay

## 2012-04-08 MED ORDER — AMPHETAMINE-DEXTROAMPHETAMINE 20 MG PO TABS: 20.0000 mg | ORAL_TABLET | Freq: Two times a day (BID) | ORAL | Status: DC

## 2012-04-08 NOTE — Telephone Encounter (Signed)
Done hardcopy to [facility]  

## 2012-04-08 NOTE — Telephone Encounter (Signed)
Called the patient informed to pickup hardcopy at the front desk. 

## 2012-04-09 ENCOUNTER — Ambulatory Visit (INDEPENDENT_AMBULATORY_CARE_PROVIDER_SITE_OTHER): Payer: BC Managed Care – PPO | Admitting: Internal Medicine

## 2012-04-09 ENCOUNTER — Encounter: Payer: Self-pay | Admitting: Internal Medicine

## 2012-04-09 VITALS — BP 102/70 | HR 79 | Temp 98.2°F | Ht 67.0 in | Wt 266.0 lb

## 2012-04-09 DIAGNOSIS — F3289 Other specified depressive episodes: Secondary | ICD-10-CM

## 2012-04-09 DIAGNOSIS — K219 Gastro-esophageal reflux disease without esophagitis: Secondary | ICD-10-CM

## 2012-04-09 DIAGNOSIS — J209 Acute bronchitis, unspecified: Secondary | ICD-10-CM

## 2012-04-09 DIAGNOSIS — F329 Major depressive disorder, single episode, unspecified: Secondary | ICD-10-CM

## 2012-04-09 MED ORDER — ONDANSETRON 4 MG PO TBDP: 4.0000 mg | ORAL_TABLET | Freq: Three times a day (TID) | ORAL | Status: DC | PRN

## 2012-04-09 MED ORDER — AZITHROMYCIN 250 MG PO TABS: ORAL_TABLET | ORAL | Status: DC

## 2012-04-09 NOTE — Patient Instructions (Signed)
Please take all new medication as prescribed - the antibiotic, and the nausea medication Please continue all other medications as before, and refills have been done if requested. You are given the work note as well Thank you for enrolling in MyChart. Please follow the instructions below to securely access your online medical record. MyChart allows you to send messages to your doctor, view your test results, renew your prescriptions, schedule appointments, and more. To Log into My Chart online, please go by [employer] or [employer] to [email or web address], or download the MyChart App from the [employer] .  Your Username is: [email or web address] (pass Clinical cytogeneticist) Please send a practice Message on Mychart later today.

## 2012-04-09 NOTE — Assessment & Plan Note (Signed)
stable overall by history and exam, and pt to continue medical treatment as before,  to f/u any worsening symptoms or concerns 

## 2012-04-09 NOTE — Assessment & Plan Note (Signed)
Mild to mod, for antibx course,  to f/u any worsening symptoms or concerns 

## 2012-04-09 NOTE — Progress Notes (Signed)
Subjective:    Patient ID: Tina [name], female    DOB: [birth year], 32 y.o.   MRN: [id number]  HPI  Here with acute onset mild to mod 2-3 days ST, HA, general weakness and malaise, with prod cough greenish sputum, but Pt denies chest pain, increased sob or doe, wheezing, orthopnea, PND, increased LE swelling, palpitations, dizziness or syncope. Has signficant nausea as well, no vomiting. One episode loose stool yesterday, Denies worsening reflux, abd pain, dysphagia, n/v, bowel change or blood.Needs work note. Denies worsening depressive symptoms, suicidal ideation, or panic Past Medical History  Diagnosis Date  . LUMBAR RADICULOPATHY, RIGHT   . HYPERLIPIDEMIA   . ANXIETY   . DEPRESSION   . ADD   . COMMON MIGRAINE   . GERD   . CHOLELITHIASIS   . Pruritus ani   . Unspecified backache   . HYPERSOMNIA   . SMOKER   . 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 Allergic rhinitis, cause unspecified 05/08/2011   Past Surgical History  Procedure Laterality Date  . Cesarean section    . Tonsillectomy    . Back surgery  11/09    reports that she has been smoking Cigarettes.  She has been smoking about 0.50 packs per day. She has never used smokeless tobacco. She reports that she does not drink alcohol or use illicit drugs. family history includes Cancer in her others and paternal grandmother; Depression in her mother; Diabetes in her father and maternal grandmother; Heart disease in her maternal grandmother; Hypertension in her father and maternal grandmother; and Kidney failure in her other. Allergies  Allergen Reactions  . Percocet (Oxycodone-Acetaminophen) Itching    SEVERE ITCHING  . Tramadol     MIGRAINE   Current Outpatient Prescriptions on File Prior to Visit  Medication Sig Dispense Refill  . amphetamine-dextroamphetamine (ADDERALL) 20 MG tablet Take 1 tablet (20 mg total) by mouth 2 (two) times daily.  60 tablet  0  . HYDROcodone-acetaminophen (NORCO) 10-325 MG  per tablet TAKE ONE TABLET BY MOUTH 4 TIMES DAILY AS NEEDED  120 tablet  0  . omeprazole (PRILOSEC) 20 MG capsule Take 20 mg by mouth daily.        . valACYclovir (VALTREX) 1000 MG tablet Take 1 tablet (1,000 mg total) by mouth 2 (two) times daily.  40 tablet  3   No current facility-administered medications on file prior to visit.   Review of Systems  Constitutional: Negative for unexpected weight change, or unusual diaphoresis  HENT: Negative for tinnitus.   Eyes: Negative for photophobia and visual disturbance.  Respiratory: Negative for choking and stridor.   Gastrointestinal: Negative for vomiting and blood in stool.  Genitourinary: Negative for hematuria and decreased urine volume.  Musculoskeletal: Negative for acute joint swelling Skin: Negative for color change and wound.  Neurological: Negative for tremors and numbness other than noted  Psychiatric/Behavioral: Negative for decreased concentration or  hyperactivity.       Objective:   Physical Exam BP 102/70  Pulse 79  Temp(Src) 98.2 F (36.8 C) (Oral)  Ht 5\' 7"  (1.702 m)  Wt 266 lb (120.657 kg)  BMI 41.65 kg/m2  SpO2 96%  LMP 03/08/2012 VS noted, mildill Constitutional: Pt appears well-developed and well-nourished.  HENT: Head: NCAT.  Right Ear: External ear normal.  Left Ear: External ear normal.  Eyes: Conjunctivae and EOM are normal. Pupils are equal, round, and reactive to light.  Bilat tm's with mild erythema.  Max sinus areas mild tender.  Pharynx  with mild erythema, no exudate Neck: Normal range of motion. Neck supple.  Cardiovascular: Normal rate and regular rhythm.   Pulmonary/Chest: Effort normal and breath sounds normal.  Abd:  Soft, NT, non-distended, + BS Neurological: Pt is alert. Not confused  Skin: Skin is warm. No erythema.  Psychiatric: Pt behavior is normal. Thought content normal. not depressed affect    Assessment & Plan:

## 2012-04-09 NOTE — Assessment & Plan Note (Signed)
.  stable overall by history and exam, recent data reviewed with pt, and pt to continue medical treatment as before,  to f/u any worsening symptoms or concerns Lab Results  Component Value Date   WBC 7.1 08/29/2011   HGB 13.4 08/29/2011   HCT 41.0 08/29/2011   PLT 222.0 08/29/2011   GLUCOSE 77 11/20/2010   CHOL 221* 02/08/2010   TRIG 218.0* 02/08/2010   HDL 46.80 02/08/2010   LDLDIRECT 149.6 02/08/2010   ALT 15 11/20/2010   AST 15 11/20/2010   NA 140 11/20/2010   K 4.3 11/20/2010   CL 103 11/20/2010   CREATININE 0.6 11/20/2010   BUN 11 11/20/2010   CO2 31 11/20/2010   TSH 0.96 11/20/2010   INR 0.9 11/17/2007

## 2012-04-21 ENCOUNTER — Encounter: Payer: Self-pay | Admitting: Internal Medicine

## 2012-04-21 ENCOUNTER — Ambulatory Visit (INDEPENDENT_AMBULATORY_CARE_PROVIDER_SITE_OTHER): Payer: BC Managed Care – PPO | Admitting: Internal Medicine

## 2012-04-21 VITALS — BP 112/70 | HR 82 | Temp 98.3°F | Ht 67.0 in | Wt 268.8 lb

## 2012-04-21 DIAGNOSIS — F329 Major depressive disorder, single episode, unspecified: Secondary | ICD-10-CM

## 2012-04-21 DIAGNOSIS — Z0000 Encounter for general adult medical examination without abnormal findings: Secondary | ICD-10-CM

## 2012-04-21 DIAGNOSIS — F411 Generalized anxiety disorder: Secondary | ICD-10-CM

## 2012-04-21 DIAGNOSIS — M542 Cervicalgia: Secondary | ICD-10-CM | POA: Insufficient documentation

## 2012-04-21 DIAGNOSIS — F988 Other specified behavioral and emotional disorders with onset usually occurring in childhood and adolescence: Secondary | ICD-10-CM

## 2012-04-21 MED ORDER — CYCLOBENZAPRINE HCL 5 MG PO TABS: 5.0000 mg | ORAL_TABLET | Freq: Three times a day (TID) | ORAL | Status: DC | PRN

## 2012-04-21 MED ORDER — PREDNISONE 10 MG PO TABS: ORAL_TABLET | ORAL | Status: DC

## 2012-04-21 MED ORDER — OXYCODONE HCL 5 MG PO TABA: 1.0000 | ORAL_TABLET | Freq: Four times a day (QID) | ORAL | Status: DC | PRN

## 2012-04-21 NOTE — Progress Notes (Signed)
Subjective:    Patient ID: Tina [name], female    DOB: [birth year], 32 y.o.   MRN: [id number]  HPI Here with onset post lower mid neck pain for 1-2 wks, gradually worse , became too severe to work yesterday and today, has some spasm and pain to left upper back today, but yesterday with neuritic sounding pain from left neck to distal left arm/wrist with numbness now improved, but no weakness. Very hard to turn head to right more than a few degrees due to pain today.   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 No bowel or bladder change, fever, wt loss,  worsening LE pain/numbness/weakness, gait change or falls.  Has known lumber disc dz s/p surgury. ADD med working well for social and work.     Denies worsening depressive symptoms, suicidal ideation, or panic Past Medical History  Diagnosis Date  . LUMBAR RADICULOPATHY, RIGHT   . HYPERLIPIDEMIA   . ANXIETY   . DEPRESSION   . ADD   . COMMON MIGRAINE   . GERD   . CHOLELITHIASIS   . Pruritus ani   . Unspecified backache   . HYPERSOMNIA   . SMOKER   . 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 Allergic rhinitis, cause unspecified 05/08/2011   Past Surgical History  Procedure Laterality Date  . Cesarean section    . Tonsillectomy    . Back surgery  11/09    reports that she has been smoking Cigarettes.  She has been smoking about 0.50 packs per day. She has never used smokeless tobacco. She reports that she does not drink alcohol or use illicit drugs. family history includes Cancer in her others and paternal grandmother; Depression in her mother; Diabetes in her father and maternal grandmother; Heart disease in her maternal grandmother; Hypertension in her father and maternal grandmother; and Kidney failure in her other. Allergies  Allergen Reactions  . Percocet (Oxycodone-Acetaminophen) Itching    SEVERE ITCHING  .  Tramadol     MIGRAINE   Current Outpatient Prescriptions on File Prior to Visit  Medication Sig Dispense Refill  . amphetamine-dextroamphetamine (ADDERALL) 20 MG tablet Take 1 tablet (20 mg total) by mouth 2 (two) times daily.  60 tablet  0  . azithromycin (ZITHROMAX Z-PAK) 250 MG tablet Use as directed  6 each  1  . HYDROcodone-acetaminophen (NORCO) 10-325 MG per tablet TAKE ONE TABLET BY MOUTH 4 TIMES DAILY AS NEEDED  120 tablet  0  . omeprazole (PRILOSEC) 20 MG capsule Take 20 mg by mouth daily.        . ondansetron (ZOFRAN ODT) 4 MG disintegrating tablet Take 1 tablet (4 mg total) by mouth every 8 (eight) hours as needed for nausea.  30 tablet  1  . valACYclovir (VALTREX) 1000 MG tablet Take 1 tablet (1,000 mg total) by mouth 2 (two) times daily.  40 tablet  3   No current facility-administered medications on file prior to visit.   Review of Systems  Constitutional: Negative for unexpected weight change, or unusual diaphoresis  HENT: Negative for tinnitus.   Eyes: Negative for photophobia and visual disturbance.  Respiratory: Negative for choking and stridor.   Gastrointestinal: Negative for vomiting and blood in stool.  Genitourinary: Negative for hematuria and decreased urine volume.  Musculoskeletal: Negative for acute joint swelling Skin: Negative for color change and wound.  Neurological: Negative for  tremors and numbness other than noted  Psychiatric/Behavioral: Negative for decreased concentration or  hyperactivity.       Objective:   Physical Exam BP 112/70  Pulse 82  Temp(Src) 98.3 F (36.8 C) (Oral)  Ht 5\' 7"  (1.702 m)  Wt 268 lb 12 oz (121.904 kg)  BMI 42.08 kg/m2  SpO2 99%  LMP 03/08/2012 VS noted, not ill appaering but appears stiff in pain at the neck Constitutional: Pt appears well-developed and well-nourished.  HENT: Head: NCAT.  Right Ear: External ear normal.  Left Ear: External ear normal.  Left trapezoid mild tender Eyes: Conjunctivae and EOM are  normal. Pupils are equal, round, and reactive to light.  Neck:  Decreased range of motion with marked limitation horizontal movement to 20 degress to the right only, 40 to the left,  Neck supple but tender lower mid cervical without red/swelling/rash , will not try extension, flexion full Cardiovascular: Normal rate and regular rhythm.   Pulmonary/Chest: Effort normal and breath sounds normal.  Neurological: Pt is alert. Not confused , motor/dtr wnl, gait intact Skin: Skin is warm. No erythema.  Psychiatric: Pt behavior is normal. Thought content normal. Mild nervous    Assessment & Plan:

## 2012-04-21 NOTE — Patient Instructions (Signed)
Please take all new medication as prescribed - the oxycodone, muscle relaxer and prednisone Please do NOT take the hydrocodone during the time of taking the oxycodone Please continue all other medications as before, and refills have been done if requested. A letter to explain the oxycodone use is to be faxed to your pain MD, and you are given a copy as well You are given the work note as well Please keep your appointments with your specialists as you have planned - your pain MD Please return in 3 months, or sooner if needed, with Lab testing done 3-5 days before

## 2012-04-21 NOTE — Assessment & Plan Note (Signed)
stable overall by history and exam, recent data reviewed with pt, and pt to continue medical treatment as before,  to f/u any worsening symptoms or concerns Lab Results  Component Value Date   WBC 7.1 08/29/2011   HGB 13.4 08/29/2011   HCT 41.0 08/29/2011   PLT 222.0 08/29/2011   GLUCOSE 77 11/20/2010   CHOL 221* 02/08/2010   TRIG 218.0* 02/08/2010   HDL 46.80 02/08/2010   LDLDIRECT 149.6 02/08/2010   ALT 15 11/20/2010   AST 15 11/20/2010   NA 140 11/20/2010   K 4.3 11/20/2010   CL 103 11/20/2010   CREATININE 0.6 11/20/2010   BUN 11 11/20/2010   CO2 31 11/20/2010   TSH 0.96 11/20/2010   INR 0.9 11/17/2007

## 2012-04-21 NOTE — Assessment & Plan Note (Signed)
Though very young, given the lumbar surgury hx, it is likely she has now cervical disc dz and/or ddd with likely flare due to work related positional stress; is actually symtpomatically somewhat improved today over yesterday but will need pain med, muscle relaxer prn, and predpack asd, neuro exam ok, but for MRI if changes or pain persists

## 2012-04-21 NOTE — Assessment & Plan Note (Signed)
stable overall by history and exam, and pt to continue medical treatment as before,  to f/u any worsening symptoms or concerns, for med continue

## 2012-04-23 ENCOUNTER — Telehealth: Payer: Self-pay | Admitting: Internal Medicine

## 2012-04-23 MED ORDER — LIDOCAINE 5 % EX PTCH: 1.0000 | MEDICATED_PATCH | CUTANEOUS | Status: DC

## 2012-04-23 NOTE — Telephone Encounter (Signed)
Called left message to call back 

## 2012-04-23 NOTE — Telephone Encounter (Signed)
Pt was seen in the office on 04/21/12 for neck pain.  Pt was prescribed (oxycodone, prednisone, and flexeril).  Pt states the medications are not helping at all.  Pt states the neck pain is causing her migraines to be worse and now her Imitrex is not helping her headaches.  Pt wants to know if there is any other medications that can prescribed for her for the pain. OFFICE PLEASE FOLLOW UP WITH PT.  Pt pharmacy is [facility] off [address]

## 2012-04-23 NOTE — Telephone Encounter (Signed)
Ok fo lidoderm patch trial  May need ortho or NS referral if not improved

## 2012-04-23 NOTE — Telephone Encounter (Signed)
Patient informed. 

## 2012-04-29 ENCOUNTER — Telehealth: Payer: Self-pay | Admitting: Internal Medicine

## 2012-04-29 ENCOUNTER — Telehealth: Payer: Self-pay | Admitting: *Deleted

## 2012-04-29 ENCOUNTER — Ambulatory Visit: Payer: Self-pay | Admitting: Internal Medicine

## 2012-04-29 NOTE — Telephone Encounter (Signed)
Patient Information:  Caller Name: Jessilyn  Phone: [phone]  Patient: Tina [name]  Gender: Female  DOB: Jun 22, 1980  Age: 32 Years  PCP: Oliver Barre (Adults only)  Pregnant: No  Office Follow Up:  Does the office need to follow up with this patient?: No  Instructions For The Office: N/A  RN Note:  Headaches continue with no relief regardless of what is used.  Imitrex initially relieved it for a while when refilled on 04/10-but having to take 2 daily; Since 04/23/12 has been continuous and off the pain scale. Has not slept well for past 2-3 days.  Is not taking any narcotics now since the Lidoderm patch is controlling neck pain; She is afraid something else is wrong.  Triaged with care advice given.  Patient wants to be seen.  Dr. Raphael Gibney schedule is full.  Scheduled with Nicki Reaper at 11:00 04/29/12.  Symptoms  Reason For Call & Symptoms: Continues to have horrible migraines.  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  Patient Will Follow Care Advice:  YES  Appointment Scheduled:  04/29/2012 11:00:00 Appointment Scheduled Provider:  Nicki Reaper

## 2012-04-29 NOTE — Telephone Encounter (Signed)
Tina [name] called for a refill on her Norco.  When I looked at last fill, it was 03/29/12, but there is a 04/21/12 #60 oxycodone fill from Dr Oliver Barre.  She has a Community education officer with [place]. Oxy fill confirmed by [place] controlled substance report.

## 2012-04-29 NOTE — Telephone Encounter (Signed)
Ok d/c the patient she violated her contract

## 2012-04-30 ENCOUNTER — Ambulatory Visit (INDEPENDENT_AMBULATORY_CARE_PROVIDER_SITE_OTHER): Payer: BC Managed Care – PPO | Admitting: Internal Medicine

## 2012-04-30 ENCOUNTER — Encounter: Payer: Self-pay | Admitting: Internal Medicine

## 2012-04-30 VITALS — BP 122/84 | HR 84 | Temp 98.0°F | Ht 67.0 in | Wt 264.0 lb

## 2012-04-30 DIAGNOSIS — G43901 Migraine, unspecified, not intractable, with status migrainosus: Secondary | ICD-10-CM

## 2012-04-30 DIAGNOSIS — J309 Allergic rhinitis, unspecified: Secondary | ICD-10-CM

## 2012-04-30 MED ORDER — SUMATRIPTAN SUCCINATE 100 MG PO TABS: 100.0000 mg | ORAL_TABLET | ORAL | Status: DC | PRN

## 2012-04-30 MED ORDER — FEXOFENADINE HCL 180 MG PO TABS: 180.0000 mg | ORAL_TABLET | Freq: Every day | ORAL | Status: DC

## 2012-04-30 MED ORDER — METHYLPREDNISOLONE ACETATE 80 MG/ML IJ SUSP
80.0000 mg | Freq: Once | INTRAMUSCULAR | Status: AC
  Administered 2012-04-30: 80 mg via INTRAMUSCULAR

## 2012-04-30 MED ORDER — KETOROLAC TROMETHAMINE 30 MG/ML IM SOLN: 30.0000 mg | Freq: Once | INTRAMUSCULAR | Status: DC

## 2012-04-30 MED ORDER — KETOROLAC TROMETHAMINE 30 MG/ML IJ SOLN
30.0000 mg | Freq: Once | INTRAMUSCULAR | Status: AC
  Administered 2012-04-30: 30 mg via INTRAMUSCULAR

## 2012-04-30 NOTE — Addendum Note (Signed)
Addended by: Carin Primrose on: 04/30/2012 08:42 AM   Modules accepted: Orders

## 2012-04-30 NOTE — Progress Notes (Signed)
Subjective:    Patient ID: Tina [name], female    DOB: 1980/06/01, 32 y.o.   MRN: [id number]  HPI  Pt presents to the clinic today with c/o migraine. This one started 1 week ago. She has taken imitrex without much relief. She thinks this headache is worse because she has not narcotics to take. She does have DJD in her back for which she is on chronic narcotics. This is managed by the pain clinic. She does have associated symptoms such as sensitivity to light and sound. She does not have any nausea or vomiting. She denies having an aura.  She does have a history of migraines.  Review of Systems  Past Medical History  Diagnosis Date  . LUMBAR RADICULOPATHY, RIGHT   . HYPERLIPIDEMIA   . ANXIETY   . DEPRESSION   . ADD   . COMMON MIGRAINE   . GERD   . CHOLELITHIASIS   . Pruritus ani   . Unspecified backache   . HYPERSOMNIA   . SMOKER   . 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 Allergic rhinitis, cause unspecified 05/08/2011    Current Outpatient Prescriptions  Medication Sig Dispense Refill  . amphetamine-dextroamphetamine (ADDERALL) 20 MG tablet Take 1 tablet (20 mg total) by mouth 2 (two) times daily.  60 tablet  0  . azithromycin (ZITHROMAX Z-PAK) 250 MG tablet Use as directed  6 each  1  . cyclobenzaprine (FLEXERIL) 5 MG tablet Take 1 tablet (5 mg total) by mouth 3 (three) times daily as needed for muscle spasms.  60 tablet  1  . HYDROcodone-acetaminophen (NORCO) 10-325 MG per tablet TAKE ONE TABLET BY MOUTH 4 TIMES DAILY AS NEEDED  120 tablet  0  . lidocaine (LIDODERM) 5 % Place 1 patch onto the skin daily. Remove & Discard patch within 12 hours or as directed by MD  90 patch  1  . omeprazole (PRILOSEC) 20 MG capsule Take 20 mg by mouth daily.        . ondansetron (ZOFRAN ODT) 4 MG disintegrating tablet Take 1 tablet (4 mg total) by mouth every 8 (eight) hours as needed for nausea.  30 tablet  1  . OxyCODONE HCl, Abuse Deter, 5 MG TABA Take 1 tablet by mouth 4  (four) times daily as needed.  60 tablet  0  . predniSONE (DELTASONE) 10 MG tablet 3 tabs by mouth per day for 3 days,2tabs per day for 3 days,1tab per day for 3 days  18 tablet  0  . valACYclovir (VALTREX) 1000 MG tablet Take 1 tablet (1,000 mg total) by mouth 2 (two) times daily.  40 tablet  3   No current facility-administered medications for this visit.    Allergies  Allergen Reactions  . Percocet (Oxycodone-Acetaminophen) Itching    SEVERE ITCHING  . Tramadol     MIGRAINE    Family History  Problem Relation Age of Onset  . Depression Mother   . Diabetes Father   . Hypertension Father   . Diabetes Maternal Grandmother     Dialysis  . Hypertension Maternal Grandmother   . Cancer Paternal Grandmother     Breast Cancer  . Cancer Other     colon  . Kidney failure Other     dialysis  . Cancer Other     colon  . Heart disease Maternal Grandmother     History   Social History  . Marital Status: Single    Spouse Name: N/A    Number  of Children: 1  . Years of Education: N/A   Occupational History  . Scientist, forensic  . part time student [employer]    Social History Main Topics  . Smoking status: Current Every Day Smoker -- 0.50 packs/day    Types: Cigarettes  . Smokeless tobacco: Never Used  . Alcohol Use: No  . Drug Use: No  . Sexually Active: Not on file   Other Topics Concern  . Not on file   Social History Narrative   Also raises 30 yo nephew     Constitutional: Pt reports migraine. Denies fever, malaise, fatigue, headache or abrupt weight changes.  HEENT: Pt reports runny nose. Denies eye pain, eye redness, ear pain, ringing in the ears, wax buildup,  nasal congestion, bloody nose, or sore throat. Respiratory: Denies difficulty breathing, shortness of breath, cough or sputum production.   Cardiovascular: Denies chest pain, chest tightness, palpitations or swelling in the hands or feet.  Gastrointestinal: Denies abdominal pain, bloating, constipation,  diarrhea or blood in the stool.  Neurological: Denies dizziness, difficulty with memory, difficulty with speech or problems with balance and coordination.   No other specific complaints in a complete review of systems (except as listed in HPI above).     Objective:   Physical Exam   BP 122/84  Pulse 84  Temp(Src) 98 F (36.7 C) (Oral)  Ht 5\' 7"  (1.702 m)  Wt 264 lb (119.75 kg)  BMI 41.34 kg/m2  SpO2 99%  LMP 03/08/2012 Wt Readings from Last 3 Encounters:  04/30/12 264 lb (119.75 kg)  04/21/12 268 lb 12 oz (121.904 kg)  04/09/12 266 lb (120.657 kg)    General: Appears her stated age, well developed, well nourished in NAD. HEENT: Head: normal shape and size; Eyes: sclera white, no icterus, conjunctiva pink, PERRLA and EOMs intact; Ears: Tm's gray and intact, normal light reflex; Nose: mucosa boggy and moist, septum midline; Throat/Mouth: Teeth present, mucosa erythenmatous and moist, no exudate, lesions or ulcerations noted.  Cardiovascular: Normal rate and rhythm. S1,S2 noted.  No murmur, rubs or gallops noted. No JVD or BLE edema. No carotid bruits noted. Pulmonary/Chest: Normal effort and positive vesicular breath sounds. No respiratory distress. No wheezes, rales or ronchi noted.  Abdomen: Soft and nontender. Normal bowel sounds, no bruits noted. No distention or masses noted. Liver, spleen and kidneys non palpable. Neurological: Alert and oriented. Cranial nerves II-XII intact. Coordination normal. +DTRs bilaterally.       Assessment & Plan:   Migraine with status migrainous:  Informed pt that we don't give out narcotics for migraines Will give 30 mg Toradol IM and 80 mg Depo IM Will refill Imitrex Pt declines referral to the headache clinic  Allergic Rhinitis, new onset:  Pt has been referred to allergist but has not gone due to cost eRx for Allegra daily

## 2012-04-30 NOTE — Patient Instructions (Signed)
Recurrent Migraine Headache A migraine headache is an intense, throbbing pain on one or both sides of your head. Recurrent migraines keep coming back. A migraine can last for 30 minutes to several hours. CAUSES  The exact cause of a migraine headache is not always known. However, a migraine may be caused when nerves in the brain become irritated and release chemicals that cause inflammation. This causes pain.  SYMPTOMS   Pain on one or both sides of your head.  Pulsating or throbbing pain.  Severe pain that prevents daily activities.  Pain that is aggravated by any physical activity.  Nausea, vomiting, or both.  Dizziness.  Pain with exposure to bright lights, loud noises, or activity.  General sensitivity to bright lights, loud noises, or smells. Before you get a migraine, you may get warning signs that a migraine is coming (aura). An aura may include:  Seeing flashing lights.  Seeing bright spots, halos, or zig-zag lines.  Having tunnel vision or blurred vision.  Having feelings of numbness or tingling.  Having trouble talking.  Having muscle weakness. MIGRAINE TRIGGERS Examples of triggers of migraine headaches include:   Alcohol.  Smoking.  Stress.  Menstruation.  Aged cheeses.  Foods or drinks that contain nitrates, glutamate, aspartame, or tyramine.  Lack of sleep.  Chocolate.  Caffeine.  Hunger.  Physical exertion.  Fatigue.  Medicines used to treat chest pain (nitroglycerine), birth control pills, estrogen, and some blood pressure medicines. DIAGNOSIS  A recurrent migraine headache is often diagnosed based on:  Symptoms.  Physical examination.  A CT scan or MRI of your head. TREATMENT  Medicines may be given for pain and nausea. Medicines can also be given to help prevent recurrent migraines. HOME CARE INSTRUCTIONS  Only take over-the-counter or prescription medicines for pain or discomfort as directed by your caregiver. The use of  long-term narcotics is not recommended.  Lie down in a dark, quiet room when you have a migraine.  Keep a journal to find out what may trigger your migraine headaches. For example, write down:  What you eat and drink.  How much sleep you get.  Any change to your diet or medicines.  Limit alcohol consumption.  Quit smoking if you smoke.  Get 7 to 9 hours of sleep, or as recommended by your caregiver.  Limit stress.  Keep lights dim if bright lights bother you and make your migraines worse. SEEK MEDICAL CARE IF:   You do not get relief from the medicines given to you.  You have a recurrence of pain. SEEK IMMEDIATE MEDICAL CARE IF:  Your migraine becomes severe.  You have a fever.  You have a stiff neck.  You have loss of vision.  You have muscular weakness or loss of muscle control.  You start losing your balance or have trouble walking.  You feel faint or pass out.  You have severe symptoms that are different from your first symptoms. MAKE SURE YOU:   Understand these instructions.  Will watch your condition.  Will get help right away if you are not doing well or get worse. Document Released: 09/24/2000 Document Revised: 03/24/2011 Document Reviewed: 12/20/2010 [employer] Patient Information 2013 [employer].  

## 2012-05-03 ENCOUNTER — Encounter: Payer: Self-pay | Admitting: *Deleted

## 2012-05-03 MED ORDER — HYDROCODONE-ACETAMINOPHEN 10-325 MG PO TABS: 1.0000 | ORAL_TABLET | Freq: Four times a day (QID) | ORAL | Status: DC | PRN

## 2012-05-03 NOTE — Telephone Encounter (Signed)
2nd request for Norco refill. Left message for Ms Tina [name] to call us back regarding her Norco Refill

## 2012-05-03 NOTE — Telephone Encounter (Signed)
[name] : I spoke with Mr Tina [name] about her Norco request and the fact that she had received  a prescription from Dr Jonny Ruiz.  She said that she told Dr Jonny Ruiz that she could not get rx but he said that he would fax Dr Wynn Banker what he was doing. She watched the office staff call and get fax # and fax note, of which she has a copy.  I spoke with Dr Wynn Banker and he did get note and we will cancel discharge.  Ms Tina [name] is to come or call our office for any further musclulo-skeletal issue.

## 2012-05-04 NOTE — Telephone Encounter (Signed)
ok 

## 2012-05-26 ENCOUNTER — Other Ambulatory Visit: Payer: Self-pay

## 2012-05-26 MED ORDER — AMPHETAMINE-DEXTROAMPHETAMINE 20 MG PO TABS: 20.0000 mg | ORAL_TABLET | Freq: Two times a day (BID) | ORAL | Status: DC

## 2012-05-26 NOTE — Telephone Encounter (Signed)
Done hardcopy to [facility]  

## 2012-05-26 NOTE — Telephone Encounter (Signed)
Called the patient left detailed message that hardcopy is ready for pickup at the front desk. 

## 2012-05-31 ENCOUNTER — Telehealth: Payer: Self-pay

## 2012-05-31 NOTE — Telephone Encounter (Signed)
Patient is requesting her Norco refill to [facility].

## 2012-06-01 MED ORDER — HYDROCODONE-ACETAMINOPHEN 10-325 MG PO TABS: 1.0000 | ORAL_TABLET | Freq: Four times a day (QID) | ORAL | Status: DC | PRN

## 2012-06-01 NOTE — Telephone Encounter (Signed)
Refilled and notified pt on vm

## 2012-06-23 ENCOUNTER — Ambulatory Visit (INDEPENDENT_AMBULATORY_CARE_PROVIDER_SITE_OTHER): Payer: BC Managed Care – PPO | Admitting: Internal Medicine

## 2012-06-23 ENCOUNTER — Other Ambulatory Visit (INDEPENDENT_AMBULATORY_CARE_PROVIDER_SITE_OTHER): Payer: BC Managed Care – PPO

## 2012-06-23 ENCOUNTER — Encounter: Payer: Self-pay | Admitting: Internal Medicine

## 2012-06-23 VITALS — BP 102/72 | HR 104 | Temp 97.1°F | Ht 67.0 in | Wt 252.5 lb

## 2012-06-23 DIAGNOSIS — Z0000 Encounter for general adult medical examination without abnormal findings: Secondary | ICD-10-CM

## 2012-06-23 DIAGNOSIS — F411 Generalized anxiety disorder: Secondary | ICD-10-CM

## 2012-06-23 DIAGNOSIS — F17200 Nicotine dependence, unspecified, uncomplicated: Secondary | ICD-10-CM

## 2012-06-23 DIAGNOSIS — F329 Major depressive disorder, single episode, unspecified: Secondary | ICD-10-CM

## 2012-06-23 DIAGNOSIS — A64 Unspecified sexually transmitted disease: Secondary | ICD-10-CM

## 2012-06-23 DIAGNOSIS — F988 Other specified behavioral and emotional disorders with onset usually occurring in childhood and adolescence: Secondary | ICD-10-CM

## 2012-06-23 LAB — BASIC METABOLIC PANEL
BUN: 8 mg/dL (ref 6–23)
Creatinine, Ser: 0.6 mg/dL (ref 0.4–1.2)
GFR: 115.09 mL/min (ref >60.00)
Potassium: 3.7 mEq/L (ref 3.5–5.1)

## 2012-06-23 LAB — CBC WITH DIFFERENTIAL/PLATELET
Basophils Relative: 0.3 % (ref 0.0–3.0)
Eosinophils Relative: 0.1 % (ref 0.0–5.0)
HCT: 42.6 % (ref 36.0–46.0)
Hemoglobin: 14.3 g/dL (ref 12.0–15.0)
Lymphs Abs: 2.3 10*3/uL (ref 0.7–4.0)
MCV: 81.7 fl (ref 78.0–100.0)
Monocytes Absolute: 0.6 10*3/uL (ref 0.1–1.0)
RBC: 5.22 Mil/uL — ABNORMAL HIGH (ref 3.87–5.11)
WBC: 12.9 10*3/uL — ABNORMAL HIGH (ref 4.5–10.5)

## 2012-06-23 LAB — URINALYSIS, ROUTINE W REFLEX MICROSCOPIC
Bilirubin Urine: NEGATIVE
Ketones, ur: NEGATIVE
Nitrite: NEGATIVE
Total Protein, Urine: NEGATIVE
pH: 7 (ref 5.0–8.0)

## 2012-06-23 LAB — HEPATIC FUNCTION PANEL
AST: 15 U/L (ref 0–37)
Total Bilirubin: 0.5 mg/dL (ref 0.3–1.2)

## 2012-06-23 LAB — LIPID PANEL
Cholesterol: 191 mg/dL (ref 0–200)
LDL Cholesterol: 118 mg/dL — ABNORMAL HIGH (ref 0–99)
VLDL: 32.4 mg/dL (ref 0.0–40.0)

## 2012-06-23 MED ORDER — BUPROPION HCL ER (XL) 150 MG PO TB24: 150.0000 mg | ORAL_TABLET | Freq: Every day | ORAL | Status: DC

## 2012-06-23 MED ORDER — AMPHETAMINE-DEXTROAMPHETAMINE 20 MG PO TABS: 20.0000 mg | ORAL_TABLET | Freq: Two times a day (BID) | ORAL | Status: DC

## 2012-06-23 MED ORDER — CLONAZEPAM 0.5 MG PO TABS: 0.5000 mg | ORAL_TABLET | Freq: Two times a day (BID) | ORAL | Status: DC | PRN

## 2012-06-23 NOTE — Assessment & Plan Note (Signed)
For med refill today, stable

## 2012-06-23 NOTE — Assessment & Plan Note (Signed)
Ok for klonopin bid prn, refer counseling,  to f/u any worsening symptoms or concerns

## 2012-06-23 NOTE — Assessment & Plan Note (Signed)
Situational related to broken relationship, verified nonsuicidal, for wellbutrin as rx, declines psychiatry

## 2012-06-23 NOTE — Progress Notes (Signed)
Subjective:    Patient ID: Tina [name], female    DOB: [birth year], 32 y.o.   MRN: [id number]  HPI  Here for wellness and f/u;  Overall doing ok;  Pt denies CP, worsening SOB, DOE, wheezing, orthopnea, PND, worsening LE edema, palpitations, dizziness or syncope.  Pt denies neurological change such as new headache, facial or extremity weakness.  Pt denies polydipsia, polyuria, or low sugar symptoms. Pt states overall good compliance with treatment and medications, good tolerability, and has been trying to follow lower cholesterol diet. No fever, night sweats, wt loss, loss of appetite, or other constitutional symptoms.  Pt states good ability with ADL's, has low fall risk, home safety reviewed and adequate, no other significant changes in hearing or vision, and only occasionally active with exercise. Does mention large anxiety and low mood related to partner recent infidelity, no SI or HI, asks for med tx, referral for counseling and STD testing.  Also asks wellbutrin for smoking, as a friend has success with that, and chantix too expensive.  Need adderall refill as well Past Medical History  Diagnosis Date  . LUMBAR RADICULOPATHY, RIGHT   . HYPERLIPIDEMIA   . ANXIETY   . DEPRESSION   . ADD   . COMMON MIGRAINE   . GERD   . CHOLELITHIASIS   . Pruritus ani   . Unspecified backache   . HYPERSOMNIA   . SMOKER   . 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 Allergic rhinitis, cause unspecified 05/08/2011   Past Surgical History  Procedure Laterality Date  . Cesarean section    . Tonsillectomy    . Back surgery  11/09    reports that she has been smoking Cigarettes.  She has been smoking about 0.50 packs per day. She has never used smokeless tobacco. She reports that she does not drink alcohol or use illicit drugs. family history includes Cancer in her others and paternal grandmother; Depression in her mother; Diabetes in her father and maternal grandmother; Heart disease in her  maternal grandmother; Hypertension in her father and maternal grandmother; and Kidney failure in her other. Allergies  Allergen Reactions  . Percocet (Oxycodone-Acetaminophen) Itching    SEVERE ITCHING  . Tramadol     MIGRAINE   Current Outpatient Prescriptions on File Prior to Visit  Medication Sig Dispense Refill  . fexofenadine (ALLEGRA) 180 MG tablet Take 1 tablet (180 mg total) by mouth daily.  30 tablet  2  . HYDROcodone-acetaminophen (NORCO) 10-325 MG per tablet Take 1 tablet by mouth every 6 (six) hours as needed for pain.  120 tablet  0  . lidocaine (LIDODERM) 5 % Place 1 patch onto the skin daily. Remove & Discard patch within 12 hours or as directed by MD  90 patch  1  . omeprazole (PRILOSEC) 20 MG capsule Take 20 mg by mouth daily.        . SUMAtriptan (IMITREX) 100 MG tablet Take 1 tablet (100 mg total) by mouth every 2 (two) hours as needed for migraine.  30 tablet  0   No current facility-administered medications on file prior to visit.   Review of Systems Constitutional: Negative for diaphoresis, activity change, appetite change or unexpected weight change.  HENT: Negative for hearing loss, ear pain, facial swelling, mouth sores and neck stiffness.   Eyes: Negative for pain, redness and visual disturbance.  Respiratory: Negative for shortness of breath and wheezing.   Cardiovascular: Negative for chest pain and palpitations.  Gastrointestinal: Negative for diarrhea,  blood in stool, abdominal distention or other pain Genitourinary: Negative for hematuria, flank pain or change in urine volume.  Musculoskeletal: Negative for myalgias and joint swelling.  Skin: Negative for color change and wound.  Neurological: Negative for syncope and numbness. other than noted Hematological: Negative for adenopathy.  Psychiatric/Behavioral: Negative for hallucinations, self-injury, decreased concentration and agitation.      Objective:   Physical Exam BP 102/72  Pulse 104   Temp(Src) 97.1 F (36.2 C) (Oral)  Ht 5\' 7"  (1.702 m)  Wt 252 lb 8 oz (114.533 kg)  BMI 39.54 kg/m2  SpO2 98% VS noted,  Constitutional: Pt is oriented to person, place, and time. Appears well-developed and well-nourished./obese Head: Normocephalic and atraumatic.  Right Ear: External ear normal.  Left Ear: External ear normal.  Nose: Nose normal.  Mouth/Throat: Oropharynx is clear and moist.  Eyes: Conjunctivae and EOM are normal. Pupils are equal, round, and reactive to light.  Neck: Normal range of motion. Neck supple. No JVD present. No tracheal deviation present.  Cardiovascular: Normal rate, regular rhythm, normal heart sounds and intact distal pulses.   Pulmonary/Chest: Effort normal and breath sounds normal.  Abdominal: Soft. Bowel sounds are normal. There is no tenderness. No HSM  Musculoskeletal: Normal range of motion. Exhibits no edema.  Lymphadenopathy:  Has no cervical adenopathy.  Neurological: Pt is alert and oriented to person, place, and time. Pt has normal reflexes. No cranial nerve deficit.  Skin: Skin is warm and dry. No rash noted.  Psychiatric:  Has depressed affect, 2+ nervous, Behavior is o/w normal.     Assessment & Plan:

## 2012-06-23 NOTE — Patient Instructions (Signed)
Please take all new medication as prescribed - the bupropion, and the klonopin as needed Please continue all other medications as before, and refills have been done if requested - the adderall You will be contacted regarding the referral for: counseling (urgent) Please go to the LAB in the Basement (turn left off the elevator) for the tests to be done today You will be contacted by phone if any changes need to be made immediately.  Otherwise, you will receive a letter about your results with an explanation, but please check with MyChart first.  Thank you for enrolling in MyChart. Please follow the instructions below to securely access your online medical record. MyChart allows you to send messages to your doctor, view your test results, renew your prescriptions, schedule appointments, and more.

## 2012-06-23 NOTE — Assessment & Plan Note (Signed)
Asympt, unfaithful partner, for STD labs,  to f/u any worsening symptoms or concerns

## 2012-06-23 NOTE — Assessment & Plan Note (Signed)
Ok for the wellbutrin as above

## 2012-06-23 NOTE — Assessment & Plan Note (Signed)

## 2012-06-24 LAB — RPR

## 2012-06-24 LAB — HSV 2 ANTIBODY, IGG: HSV 2 Glycoprotein G Ab, IgG: 0.32 IV

## 2012-06-25 LAB — GC/CHLAMYDIA PROBE AMP, URINE: Chlamydia, Swab/Urine, PCR: NEGATIVE

## 2012-06-29 ENCOUNTER — Encounter
Payer: BC Managed Care – PPO | Attending: Physical Medicine and Rehabilitation | Admitting: Physical Medicine and Rehabilitation

## 2012-06-29 ENCOUNTER — Encounter: Payer: Self-pay | Admitting: Physical Medicine and Rehabilitation

## 2012-06-29 VITALS — BP 137/74 | HR 103 | Resp 14 | Ht 67.0 in | Wt 258.0 lb

## 2012-06-29 DIAGNOSIS — M545 Low back pain, unspecified: Secondary | ICD-10-CM | POA: Insufficient documentation

## 2012-06-29 DIAGNOSIS — Z79899 Other long term (current) drug therapy: Secondary | ICD-10-CM

## 2012-06-29 DIAGNOSIS — M79609 Pain in unspecified limb: Secondary | ICD-10-CM | POA: Insufficient documentation

## 2012-06-29 DIAGNOSIS — M47817 Spondylosis without myelopathy or radiculopathy, lumbosacral region: Secondary | ICD-10-CM | POA: Insufficient documentation

## 2012-06-29 DIAGNOSIS — M961 Postlaminectomy syndrome, not elsewhere classified: Secondary | ICD-10-CM

## 2012-06-29 DIAGNOSIS — G894 Chronic pain syndrome: Secondary | ICD-10-CM | POA: Insufficient documentation

## 2012-06-29 DIAGNOSIS — Z5181 Encounter for therapeutic drug level monitoring: Secondary | ICD-10-CM

## 2012-06-29 MED ORDER — HYDROCODONE-ACETAMINOPHEN 10-325 MG PO TABS: 1.0000 | ORAL_TABLET | Freq: Four times a day (QID) | ORAL | Status: DC | PRN

## 2012-06-29 MED ORDER — GABAPENTIN 300 MG PO CAPS: 300.0000 mg | ORAL_CAPSULE | Freq: Three times a day (TID) | ORAL | Status: DC

## 2012-06-29 NOTE — Progress Notes (Signed)
Subjective:    Patient ID: Tina [name], female    DOB: 07/02/1980, 32 y.o.   MRN: [id number]  HPI The patient is a 32 year old female , who presents with right leg pain and LBP . The symptoms started 5 years ago, after an accident at her work place, after lifting a heavy object. Hx of discectomy, by Dr. Jillyn Hidden Nov. 2009, not much improvement after the surgery. The patient complains about moderate pain , which radiate to the right LE, posteriorly into the thigh. Patient also denies numbness and tingling. She describes the pain as a sharp shooting, sometimes burning pain. Taking medications , changing positions alleviate the symptoms. Prolonged standing and sitting aggrevates the symptoms. The patient grades his pain as a 7 /10. The patient states, that her symptoms have gotten worse since her last visit 3 month ago, she complains about more severe radiating pain into her posterior thigh, and she states, now the pain radiates down into her right calf and sometimes into her right foot.  Pain Inventory Average Pain 8 Pain Right Now 7 My pain is sharp, burning, stabbing and aching  In the last 24 hours, has pain interfered with the following? General activity 4 Relation with others 4 Enjoyment of life 4 What TIME of day is your pain at its worst? varies Sleep (in general) Fair  Pain is worse with: walking, sitting, standing and some activites Pain improves with: rest and medication Relief from Meds: 6  Mobility walk without assistance how many minutes can you walk? 10-20 ability to climb steps?  yes do you drive?  yes  Function employed # of hrs/week 32-40 I need assistance with the following:  household duties  Neuro/Psych No problems in this area  Prior Studies Any changes since last visit?  no  Physicians involved in your care Any changes since last visit?  no   Family History  Problem Relation Age of Onset  . Depression Mother   . Diabetes Father   . Hypertension  Father   . Diabetes Maternal Grandmother     Dialysis  . Hypertension Maternal Grandmother   . Cancer Paternal Grandmother     Breast Cancer  . Cancer Other     colon  . Kidney failure Other     dialysis  . Cancer Other     colon  . Heart disease Maternal Grandmother    History   Social History  . Marital Status: Single    Spouse Name: N/A    Number of Children: 1  . Years of Education: N/A   Occupational History  . Scientist, forensic  . part time student [employer]    Social History Main Topics  . Smoking status: Current Every Day Smoker -- 0.50 packs/day    Types: Cigarettes  . Smokeless tobacco: Never Used  . Alcohol Use: No  . Drug Use: No  . Sexually Active: None   Other Topics Concern  . None   Social History Narrative   Also raises 73 yo nephew   Past Surgical History  Procedure Laterality Date  . Cesarean section    . Tonsillectomy    . Back surgery  11/09   Past Medical History  Diagnosis Date  . LUMBAR RADICULOPATHY, RIGHT   . HYPERLIPIDEMIA   . ANXIETY   . DEPRESSION   . ADD   . COMMON MIGRAINE   . GERD   . CHOLELITHIASIS   . Pruritus ani   . Unspecified backache   .  HYPERSOMNIA   . SMOKER   . 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 Allergic rhinitis, cause unspecified 05/08/2011   BP 137/74  Pulse 103  Resp 14  Ht 5\' 7"  (1.702 m)  Wt 258 lb (117.028 kg)  BMI 40.4 kg/m2  SpO2 99%     Review of Systems  Musculoskeletal: Positive for back pain.  All other systems reviewed and are negative.       Objective:   Physical Exam Constitutional: She is oriented to person, place, and time. She appears well-developed and well-nourished.  Obese  HENT:  Head: Normocephalic.  Neck: Neck supple.  Musculoskeletal: She exhibits tenderness.  Neurological: She is alert and oriented to person, place, and time.  Skin: Skin is warm and dry.  Psychiatric: She has a normal mood and affect.  Symmetric normal motor tone is noted  throughout. Normal muscle bulk. Muscle testing reveals 5/5 muscle strength of the upper extremity, and 5/5 of the lower extremity. Full range of motion in upper and lower extremities. ROM of spine is restricted. Fine motor movements are normal in both hands.  Sensory is intact and symmetric to light touch, pinprick and proprioception.  DTR in the upper and lower extremity are present and symmetric 2+. No clonus is noted.  Patient arises from chair without difficulty. Narrow based gait with normal arm swing bilateral .        Assessment & Plan:  1. Lumbosacral spondylosis with chronic pain syndrome. She has not responded positively to medial branch blocks. She's had minor relief with medial branch blocks. 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 Her symptoms have gotten worse since her last visit 3 month ago, she complains about more severe radiating pain into her posterior thigh, and she states, now the pain radiates down into her right calf and sometimes into her right foot, ordered MRI of L-spine, consider further treatment options after the results. Also prescribed Gabapentin to help with the radiating symptoms, advised the patient to start with one tablet at hs, 300mg , and after 3 days add another tablet in the am, then after 3 days add another one at noon.  I encouraged aquatic exercise with or without walking, which she did not do. Over time the goal is to reduce her pain medications and try to improve her symptoms with exercising,weight loss and stress reduction. Discussed this with the patient, she has agreed with the plan.  Refilled Hydrocodone. Also encouraged patient to loose some weight, advised her to cut down on her sodas, and try to work out regulary, I recommended walking on the eliptical regulary. Recommended aquatic exercising, to built up strength without putting too much weight on her spine/joints. This would also help to loose weight. Patient lost 14 lbs,  since her last visit, but she does not do any exercises or walking, she also still drinks sodas, although she states, that she is cutting down on them. I advised her to follow up with her PCP and test her thyroid function at the last visit,.  Follow up with PA in 3 months

## 2012-06-29 NOTE — Patient Instructions (Signed)
Try to stay as active as tolerated 

## 2012-07-05 ENCOUNTER — Ambulatory Visit (HOSPITAL_COMMUNITY): Payer: BC Managed Care – PPO

## 2012-07-05 ENCOUNTER — Ambulatory Visit (HOSPITAL_COMMUNITY)
Admission: RE | Admit: 2012-07-05 | Discharge: 2012-07-05 | Disposition: A | Discharge status: Home / Self Care | Payer: BC Managed Care – PPO | Source: Ambulatory Visit | Attending: Physical Medicine and Rehabilitation | Admitting: Physical Medicine and Rehabilitation

## 2012-07-05 DIAGNOSIS — M961 Postlaminectomy syndrome, not elsewhere classified: Secondary | ICD-10-CM

## 2012-07-06 ENCOUNTER — Ambulatory Visit (INDEPENDENT_AMBULATORY_CARE_PROVIDER_SITE_OTHER): Payer: BC Managed Care – PPO | Admitting: Licensed Clinical Social Worker

## 2012-07-06 DIAGNOSIS — F321 Major depressive disorder, single episode, moderate: Secondary | ICD-10-CM

## 2012-07-09 ENCOUNTER — Ambulatory Visit (HOSPITAL_COMMUNITY): Admission: RE | Admit: 2012-07-09 | Payer: BC Managed Care – PPO | Source: Ambulatory Visit

## 2012-07-12 ENCOUNTER — Ambulatory Visit (HOSPITAL_COMMUNITY)
Admission: RE | Admit: 2012-07-12 | Discharge: 2012-07-12 | Disposition: A | Discharge status: Home / Self Care | Payer: BC Managed Care – PPO | Source: Ambulatory Visit | Attending: Physical Medicine and Rehabilitation | Admitting: Physical Medicine and Rehabilitation

## 2012-07-12 DIAGNOSIS — R29898 Other symptoms and signs involving the musculoskeletal system: Secondary | ICD-10-CM | POA: Insufficient documentation

## 2012-07-12 DIAGNOSIS — M5126 Other intervertebral disc displacement, lumbar region: Secondary | ICD-10-CM | POA: Insufficient documentation

## 2012-07-12 DIAGNOSIS — M5137 Other intervertebral disc degeneration, lumbosacral region: Secondary | ICD-10-CM | POA: Insufficient documentation

## 2012-07-12 DIAGNOSIS — M51379 Other intervertebral disc degeneration, lumbosacral region without mention of lumbar back pain or lower extremity pain: Secondary | ICD-10-CM | POA: Insufficient documentation

## 2012-07-12 DIAGNOSIS — M79609 Pain in unspecified limb: Secondary | ICD-10-CM | POA: Insufficient documentation

## 2012-07-12 DIAGNOSIS — M5380 Other specified dorsopathies, site unspecified: Secondary | ICD-10-CM | POA: Insufficient documentation

## 2012-07-20 ENCOUNTER — Ambulatory Visit: Payer: BC Managed Care – PPO | Admitting: Licensed Clinical Social Worker

## 2012-07-22 ENCOUNTER — Ambulatory Visit (INDEPENDENT_AMBULATORY_CARE_PROVIDER_SITE_OTHER): Payer: BC Managed Care – PPO | Admitting: Licensed Clinical Social Worker

## 2012-07-22 DIAGNOSIS — F321 Major depressive disorder, single episode, moderate: Secondary | ICD-10-CM

## 2012-07-27 ENCOUNTER — Telehealth: Payer: Self-pay | Admitting: *Deleted

## 2012-07-27 NOTE — Telephone Encounter (Signed)
Tearful.  Has an appt 7/17 with [name] but cannot take anymore of this intense pain.  Please call.

## 2012-07-28 NOTE — Telephone Encounter (Signed)
Tried to contact patient but number listed was not available.  Will try again later.  [name] does have appointments available this afternoon.

## 2012-07-29 ENCOUNTER — Encounter: Payer: Self-pay | Admitting: Physical Medicine and Rehabilitation

## 2012-07-29 ENCOUNTER — Encounter
Payer: BC Managed Care – PPO | Attending: Physical Medicine and Rehabilitation | Admitting: Physical Medicine and Rehabilitation

## 2012-07-29 VITALS — BP 133/76 | HR 92 | Resp 14 | Ht 67.0 in | Wt 255.2 lb

## 2012-07-29 DIAGNOSIS — G894 Chronic pain syndrome: Secondary | ICD-10-CM | POA: Insufficient documentation

## 2012-07-29 DIAGNOSIS — M898X9 Other specified disorders of bone, unspecified site: Secondary | ICD-10-CM | POA: Insufficient documentation

## 2012-07-29 DIAGNOSIS — M47817 Spondylosis without myelopathy or radiculopathy, lumbosacral region: Secondary | ICD-10-CM | POA: Insufficient documentation

## 2012-07-29 DIAGNOSIS — M961 Postlaminectomy syndrome, not elsewhere classified: Secondary | ICD-10-CM

## 2012-07-29 DIAGNOSIS — M79609 Pain in unspecified limb: Secondary | ICD-10-CM | POA: Insufficient documentation

## 2012-07-29 MED ORDER — MELOXICAM 15 MG PO TABS: 15.0000 mg | ORAL_TABLET | Freq: Every day | ORAL | Status: DC

## 2012-07-29 MED ORDER — METHOCARBAMOL 500 MG PO TABS: 500.0000 mg | ORAL_TABLET | Freq: Three times a day (TID) | ORAL | Status: DC

## 2012-07-29 MED ORDER — HYDROCODONE-ACETAMINOPHEN 10-325 MG PO TABS: 1.0000 | ORAL_TABLET | Freq: Four times a day (QID) | ORAL | Status: DC | PRN

## 2012-07-29 NOTE — Patient Instructions (Signed)
Try to stay as active as tolerated 

## 2012-07-29 NOTE — Progress Notes (Signed)
Subjective:    Patient ID: Tina [name], female    DOB: 1980-11-02, 32 y.o.   MRN: [id number]  HPI The patient is a 32 year old female , who presents with right leg pain and LBP . The symptoms started 5 years ago, after an accident at her work place, after lifting a heavy object. Hx of discectomy, by Dr. Jillyn Hidden Nov. 2009, not much improvement after the surgery. The patient complains about moderate pain , which radiate to the right LE, posteriorly into the thigh. Patient also denies numbness and tingling. She describes the pain as a sharp shooting, sometimes burning pain. Taking medications , changing positions alleviate the symptoms. Prolonged standing and sitting aggrevates the symptoms. The patient grades his pain as a 7 /10.  The patient states, that her symptoms have gotten worse since her last visit 3 month ago, she complains about more severe radiating pain into her posterior thigh.   Pain Inventory Average Pain 8 Pain Right Now 9 My pain is sharp, burning, stabbing and aching  In the last 24 hours, has pain interfered with the following? General activity 7 Relation with others 8 Enjoyment of life 9 What TIME of day is your pain at its worst? all Sleep (in general) Fair  Pain is worse with: walking, bending, sitting, standing and some activites Pain improves with: rest and medication Relief from Meds: 6  Mobility walk without assistance how many minutes can you walk? 10-15 ability to climb steps?  yes do you drive?  yes  Function employed # of hrs/week 32-40 I need assistance with the following:  household duties  Neuro/Psych No problems in this area  Prior Studies Any changes since last visit?  no  Physicians involved in your care Any changes since last visit?  no   Family History  Problem Relation Age of Onset  . Depression Mother   . Diabetes Father   . Hypertension Father   . Diabetes Maternal Grandmother     Dialysis  . Hypertension Maternal  Grandmother   . Cancer Paternal Grandmother     Breast Cancer  . Cancer Other     colon  . Kidney failure Other     dialysis  . Cancer Other     colon  . Heart disease Maternal Grandmother    History   Social History  . Marital Status: Single    Spouse Name: N/A    Number of Children: 1  . Years of Education: N/A   Occupational History  . Scientist, forensic  . part time student [employer]    Social History Main Topics  . Smoking status: Current Every Day Smoker -- 0.50 packs/day    Types: Cigarettes  . Smokeless tobacco: Never Used  . Alcohol Use: No  . Drug Use: No  . Sexually Active: None   Other Topics Concern  . None   Social History Narrative   Also raises 2 yo nephew   Past Surgical History  Procedure Laterality Date  . Cesarean section    . Tonsillectomy    . Back surgery  11/09   Past Medical History  Diagnosis Date  . LUMBAR RADICULOPATHY, RIGHT   . HYPERLIPIDEMIA   . ANXIETY   . DEPRESSION   . ADD   . COMMON MIGRAINE   . GERD   . CHOLELITHIASIS   . Pruritus ani   . Unspecified backache   . HYPERSOMNIA   . SMOKER   . 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  Allergic rhinitis, cause unspecified 05/08/2011   BP 133/76  Pulse 92  Resp 14  Ht 5\' 7"  (1.702 m)  Wt 255 lb 3.2 oz (115.758 kg)  BMI 39.96 kg/m2  SpO2 99%  LMP 06/15/2012   Review of Systems  Musculoskeletal: Positive for back pain.  All other systems reviewed and are negative.       Objective:   Physical Exam Constitutional: She is oriented to person, place, and time. She appears well-developed and well-nourished.  Obese  HENT:  Head: Normocephalic.  Neck: Neck supple.  Musculoskeletal: She exhibits tenderness.  Neurological: She is alert and oriented to person, place, and time.  Skin: Skin is warm and dry.  Psychiatric: She has a normal mood and affect.  Symmetric normal motor tone is noted throughout. Normal muscle bulk. Muscle testing reveals 5/5 muscle  strength of the upper extremity, and 5/5 of the lower extremity. Full range of motion in upper and lower extremities. ROM of spine is restricted. Fine motor movements are normal in both hands.  Sensory is intact and symmetric to light touch, pinprick and proprioception.  DTR in the upper and lower extremity are present and symmetric 2+. No clonus is noted.  Patient arises from chair without difficulty. Narrow based gait with normal arm swing bilateral . Mild left convex lumbar scoliosis       Assessment & Plan:  1. Lumbosacral spondylosis with chronic pain syndrome. She has not responded positively to medial branch blocks. She's had minor relief with medial branch blocks. 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 Her symptoms have gotten worse since her last visit 3 month ago, she complains about more severe radiating pain into her posterior thigh, and she states, now the pain radiates down into her right calf and sometimes into her right foot, ordered MRI of L-spine,which showed : Postop laminectomy on the right L5-S1. There is mild right  foraminal narrowing due to spurring, unchanged from the prior  study. No recurrent disc protrusion  Left foraminal disc and osteophyte and L3-4 unchanged from the  prior MRI. Recommended aquatic PT, patient will think about this. Prescribed Robaxin for muscle spasms/pain in her lower back, and Mobic for her increased pain especially after/during her work.  Also prescribed Gabapentin at the last visit, to help with the radiating symptoms, advised the patient to start with one tablet at hs, 300mg , and after 3 days add another tablet in the am, then after 3 days add another one at noon. The patient could only tolerate the dosage at night time, she is taking 300mg  at night now, during the day she gets to tired. I encouraged aquatic exercise with or without walking, which she did not do. Over time the goal is to reduce her pain medications and  try to improve her symptoms with exercising,weight loss and stress reduction. Discussed this with the patient, she has agreed with the plan.  Refilled Hydrocodone. Also encouraged patient to loose some weight, advised her to cut down on her sodas, and try to work out regulary, I recommended walking on the eliptical regulary. Recommended aquatic exercising, to built up strength without putting too much weight on her spine/joints. This would also help to loose weight. Patient lost 14 lbs, since her last visit, but she does not do any exercises or walking, she also still drinks sodas, although she states, that she is cutting down on them.  Follow up with PA in 3 months

## 2012-07-30 NOTE — Telephone Encounter (Signed)
Visit with [name] yesterday so closing encounter.

## 2012-08-03 ENCOUNTER — Encounter: Payer: Self-pay | Admitting: Physical Medicine and Rehabilitation

## 2012-08-03 ENCOUNTER — Encounter (HOSPITAL_BASED_OUTPATIENT_CLINIC_OR_DEPARTMENT_OTHER): Payer: BC Managed Care – PPO | Admitting: Physical Medicine and Rehabilitation

## 2012-08-03 VITALS — BP 137/80 | HR 90 | Resp 17 | Ht 67.0 in | Wt 258.0 lb

## 2012-08-03 DIAGNOSIS — M545 Low back pain: Secondary | ICD-10-CM

## 2012-08-03 DIAGNOSIS — M47817 Spondylosis without myelopathy or radiculopathy, lumbosacral region: Secondary | ICD-10-CM

## 2012-08-03 MED ORDER — KETOROLAC TROMETHAMINE 60 MG/2ML IM SOLN: 60.0000 mg | Freq: Once | INTRAMUSCULAR | Status: AC

## 2012-08-03 NOTE — Addendum Note (Signed)
Addended by: Judd Gaudier on: 08/03/2012 03:14 PM   Modules accepted: Orders

## 2012-08-03 NOTE — Progress Notes (Addendum)
Subjective:    Patient ID: Tina [name], female    DOB: 1980-10-29, 31 y.o.   MRN: [id number]  HPI The patient is a 32 year old female , who presents with right leg pain and LBP . The symptoms started 5 years ago, after an accident at her work place, after lifting a heavy object. Hx of discectomy, by Dr. Jillyn Hidden Nov. 2009, not much improvement after the surgery. The patient complains about severe pain , which radiate to the right LE, posteriorly into the thigh. Patient also complains about numbness and tingling in the same area. She describes the pain as a sharp, shooting, sometimes burning pain. Taking medications , changing positions alleviate the symptoms some. Prolonged standing and sitting aggrevates the symptoms. The patient grades his pain as a 10 /10.  The patient states, that her symptoms have gotten worse since her last visit 3 days ago. The patient states, that she is not able to work, because of her pain.  Pain Inventory Average Pain 10 Pain Right Now 10 My pain is sharp, burning, dull, stabbing, tingling and aching  In the last 24 hours, has pain interfered with the following? General activity 10 Relation with others 10 Enjoyment of life 10 What TIME of day is your pain at its worst? constant Sleep (in general) Poor  Pain is worse with: walking, bending, sitting, standing, unsure and some activites Pain improves with: na Relief from Meds: 4  Mobility how many minutes can you walk? 0 ability to climb steps?  yes do you drive?  yes Do you have any goals in this area?  yes  Function employed # of hrs/week 40 Do you have any goals in this area?  no  Neuro/Psych No problems in this area  Prior Studies Any changes since last visit?  no  Physicians involved in your care Any changes since last visit?  no   Family History  Problem Relation Age of Onset  . Depression Mother   . Diabetes Father   . Hypertension Father   . Diabetes Maternal Grandmother    Dialysis  . Hypertension Maternal Grandmother   . Cancer Paternal Grandmother     Breast Cancer  . Cancer Other     colon  . Kidney failure Other     dialysis  . Cancer Other     colon  . Heart disease Maternal Grandmother    History   Social History  . Marital Status: Single    Spouse Name: N/A    Number of Children: 1  . Years of Education: N/A   Occupational History  . Scientist, forensic  . part time student [employer]    Social History Main Topics  . Smoking status: Current Every Day Smoker -- 0.50 packs/day    Types: Cigarettes  . Smokeless tobacco: Never Used  . Alcohol Use: No  . Drug Use: No  . Sexually Active: None   Other Topics Concern  . None   Social History Narrative   Also raises 25 yo nephew   Past Surgical History  Procedure Laterality Date  . Cesarean section    . Tonsillectomy    . Back surgery  11/09   Past Medical History  Diagnosis Date  . LUMBAR RADICULOPATHY, RIGHT   . HYPERLIPIDEMIA   . ANXIETY   . DEPRESSION   . ADD   . COMMON MIGRAINE   . GERD   . CHOLELITHIASIS   . Pruritus ani   . Unspecified backache   .  HYPERSOMNIA   . SMOKER   . 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 Allergic rhinitis, cause unspecified 05/08/2011   BP 137/80  Pulse 90  Resp 17  Ht 5\' 7"  (1.702 m)  Wt 258 lb (117.028 kg)  BMI 40.4 kg/m2  SpO2 99%  LMP 07/16/2012     Review of Systems  All other systems reviewed and are negative.       Objective:   Physical Exam Constitutional: She is oriented to person, place, and time. She appears well-developed and well-nourished.  Obese  HENT:  Head: Normocephalic.  Neck: Neck supple.  Musculoskeletal: She exhibits tenderness.  Neurological: She is alert and oriented to person, place, and time.  Skin: Skin is warm and dry.  Psychiatric: She has a normal mood and affect.  Symmetric normal motor tone is noted throughout. Normal muscle bulk. Muscle testing reveals 5/5 muscle strength of  the upper extremity, and 5/5 of the lower extremity. Full range of motion in upper and lower extremities. ROM of spine is restricted. Fine motor movements are normal in both hands.  Sensory is intact and symmetric to light touch, pinprick and proprioception.  DTR in the upper and lower extremity are present and symmetric 2+. No clonus is noted.  Patient arises from chair with difficulty. Narrow based gait with normal arm swing bilateral .  Mild left convex lumbar scoliosis, today with deviation to the left, because of pain        Assessment & Plan:  1. Lumbosacral spondylosis with chronic pain syndrome. She has not responded positively to medial branch blocks. She's had minor relief with medial branch blocks. 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 Her symptoms have gotten worse since her last visit 3 month ago, she complains about more severe radiating pain into her posterior thigh, and she states, now the pain radiates down into her right calf and sometimes into her right foot, ordered MRI of L-spine,which showed :  Postop laminectomy on the right L5-S1. There is mild right  foraminal narrowing due to spurring, unchanged from the prior  study. No recurrent disc protrusion  Left foraminal disc and osteophyte and L3-4 unchanged from the  prior MRI.  Exacerbation of LBP, numbness and tingling, radiating into her right LE, down the posterior side of her right leg, into the right foot,  Patient received a Toradol injection , after signing consent, I also ordered an ESI, with Dr. Doroteo Bradford on Thursday the 24th/14. I discussed this plan with Dr. Doroteo Bradford , he agreed. Educated patient about cauda equina syndrome, and that she should go to the ED asap , if she develops the discussed Sx. Wrote a work note, patient out of work until Monday the 28th of July.  Recommended aquatic PT, after the severe pain of this exacerbation has resolved.  Prescribed Robaxin for muscle  spasms/pain in her lower back, and Mobic for her increased pain especially after/during her work, at her last visit 3 days ago, I advised her to take those medications as prescribed.  Also prescribed Gabapentin at the last visit, to help with the radiating symptoms, advised the patient to start with one tablet at hs, 300mg , and after 3 days add another tablet in the am, then after 3 days add another one at noon. The patient could only tolerate the dosage at night time, she is taking 300mg  at night now, during the day she gets to tired.  I encouraged aquatic exercise with or without walking, which she did  not do. Over time the goal is to reduce her pain medications and try to improve her symptoms with exercising,weight loss and stress reduction. Discussed this with the patient, she has agreed with the plan.  Refilled Hydrocodone. Also encouraged patient to loose some weight, advised her to cut down on her sodas, and try to work out regulary, I recommended walking on the eliptical regulary. Recommended aquatic exercising, to built up strength without putting too much weight on her spine/joints. This would also help to loose weight. Patient lost 14 lbs, since her last visit, but she does not do any exercises or walking, she also still drinks sodas, although she states, that she is cutting down on them.  Follow up with PA in 3 months  Addendum Ordered PT, after patient received lumbar  ESI, and got some relief from it, patient stated she now feels ok to do PT

## 2012-08-05 ENCOUNTER — Encounter: Payer: Self-pay | Admitting: Physical Medicine & Rehabilitation

## 2012-08-05 ENCOUNTER — Ambulatory Visit (HOSPITAL_BASED_OUTPATIENT_CLINIC_OR_DEPARTMENT_OTHER): Payer: BC Managed Care – PPO | Admitting: Physical Medicine & Rehabilitation

## 2012-08-05 ENCOUNTER — Ambulatory Visit: Payer: BC Managed Care – PPO | Admitting: Licensed Clinical Social Worker

## 2012-08-05 VITALS — BP 130/73 | HR 87 | Resp 14 | Ht 67.0 in | Wt 258.0 lb

## 2012-08-05 DIAGNOSIS — IMO0002 Reserved for concepts with insufficient information to code with codable children: Secondary | ICD-10-CM

## 2012-08-05 NOTE — Progress Notes (Signed)
Right L5-S1 Lumbar transforaminal epidural steroid injection under fluoroscopic guidance  Indication: Lumbosacral radiculitis is not relieved by medication management or other conservative care and interfering with self-care and mobility.   Informed consent was obtained after describing risk and benefits of the procedure with the patient, this includes bleeding, bruising, infection, paralysis and medication side effects.  The patient wishes to proceed and has given written consent.  Patient was placed in prone position.  The lumbar area was marked and prepped with Betadine.  It was entered with a 25-gauge 1-1/2 inch needle and one mL of 1% lidocaine was injected into the skin and subcutaneous tissue.  Then a 22-gauge 5 inch  spinal needle was inserted into the R L5-S1 intervertebral foramen under AP, lateral, and oblique view.  Then a solution containing one mL of 10 mg per mL dexamethasone and 2 mL of 1% lidocaine was injected.  The patient tolerated procedure well.  Post procedure instructions were given.  Please see post procedure form.

## 2012-08-05 NOTE — Progress Notes (Signed)
  PROCEDURE RECORD [facility]   Name: Tina [name] DOB:01-08-1981 MRN: [id number]  Date:08/05/2012  Physician: Claudette Laws, MD    Nurse/CMA: Shumaker RN  Allergies:  Allergies  Allergen Reactions  . Percocet (Oxycodone-Acetaminophen) Itching    SEVERE ITCHING  . Tramadol     MIGRAINE    Consent Signed: yes  Is patient diabetic? no  CBG today?   Pregnant: no LMP: Patient's last menstrual period was 07/16/2012. (age 34-55)  Anticoagulants: no Anti-inflammatory: no Antibiotics: no  Procedure: L5 S1 Lumbar Epidural Steroid Injection Position: Prone Start Time: 1:55  End Time: 1:59 Fluoro Time: 21 sec   RN/CMA Designer, multimedia    Time 13:39 2:04    BP 130/73 126/70    Pulse 87 100    Respirations 14 14    O2 Sat 98 98    S/S 6 6    Pain Level 9/10 0/100     D/C home with her father, patient A & O X 3, D/C instructions reviewed, and sits independently.

## 2012-08-05 NOTE — Patient Instructions (Signed)

## 2012-08-06 ENCOUNTER — Telehealth: Payer: Self-pay

## 2012-08-06 NOTE — Telephone Encounter (Signed)
She just got an injection yesterday (TF-ESI ), if she is a little better on Monday we can order PT , if not we can refer her to Neurosurgery

## 2012-08-06 NOTE — Telephone Encounter (Signed)
Patient called and would like to be referred to [facility] or physical therapy for her pain.

## 2012-08-13 NOTE — Telephone Encounter (Signed)
Ok, will put order for PT in

## 2012-08-13 NOTE — Telephone Encounter (Signed)
Patient is feeling better and would like to follow up with PT.

## 2012-08-13 NOTE — Addendum Note (Signed)
Addended by: Su Monks on: 08/13/2012 11:51 AM   Modules accepted: Orders

## 2012-08-19 ENCOUNTER — Telehealth: Payer: Self-pay

## 2012-08-19 MED ORDER — AMPHETAMINE-DEXTROAMPHETAMINE 20 MG PO TABS: 20.0000 mg | ORAL_TABLET | Freq: Two times a day (BID) | ORAL | Status: DC

## 2012-08-19 NOTE — Telephone Encounter (Signed)
Done hardcopy to [facility]  

## 2012-08-19 NOTE — Telephone Encounter (Signed)
Pt called [facility] requesting medication refill for adderall. Thanks

## 2012-08-20 NOTE — Telephone Encounter (Signed)
Called the patient left detailed msg. That hardcopy is ready for pickup at the front desk. 

## 2012-08-23 ENCOUNTER — Ambulatory Visit: Payer: BC Managed Care – PPO | Attending: Physical Medicine and Rehabilitation | Admitting: Physical Therapy

## 2012-08-25 ENCOUNTER — Telehealth: Payer: Self-pay

## 2012-08-25 NOTE — Telephone Encounter (Signed)
Patient called and stated that she seen last month and was put out of work. She brought a paper up here to get filled out for work and still has not got the paper back. She said her job is on the line if she does not get this paper.

## 2012-09-02 ENCOUNTER — Ambulatory Visit: Payer: BC Managed Care – PPO | Admitting: Physical Medicine & Rehabilitation

## 2012-10-01 ENCOUNTER — Telehealth: Payer: Self-pay | Admitting: *Deleted

## 2012-10-01 MED ORDER — HYDROCODONE-ACETAMINOPHEN 10-325 MG PO TABS: 1.0000 | ORAL_TABLET | Freq: Four times a day (QID) | ORAL | Status: DC | PRN

## 2012-10-01 NOTE — Telephone Encounter (Signed)
Refill norco. Done. Iretha notified and that it will no longer be able to be called in after Oct 1.

## 2012-10-22 ENCOUNTER — Encounter
Payer: BC Managed Care – PPO | Attending: Physical Medicine and Rehabilitation | Admitting: Physical Medicine and Rehabilitation

## 2012-10-22 ENCOUNTER — Encounter: Payer: Self-pay | Admitting: Physical Medicine and Rehabilitation

## 2012-10-22 VITALS — BP 130/80 | HR 80 | Resp 14 | Ht 67.0 in | Wt 258.0 lb

## 2012-10-22 DIAGNOSIS — M545 Low back pain, unspecified: Secondary | ICD-10-CM | POA: Insufficient documentation

## 2012-10-22 DIAGNOSIS — M4120 Other idiopathic scoliosis, site unspecified: Secondary | ICD-10-CM | POA: Insufficient documentation

## 2012-10-22 DIAGNOSIS — M79609 Pain in unspecified limb: Secondary | ICD-10-CM | POA: Insufficient documentation

## 2012-10-22 DIAGNOSIS — G894 Chronic pain syndrome: Secondary | ICD-10-CM | POA: Insufficient documentation

## 2012-10-22 DIAGNOSIS — M47817 Spondylosis without myelopathy or radiculopathy, lumbosacral region: Secondary | ICD-10-CM | POA: Insufficient documentation

## 2012-10-22 DIAGNOSIS — E785 Hyperlipidemia, unspecified: Secondary | ICD-10-CM | POA: Insufficient documentation

## 2012-10-22 DIAGNOSIS — R209 Unspecified disturbances of skin sensation: Secondary | ICD-10-CM | POA: Insufficient documentation

## 2012-10-22 DIAGNOSIS — F17200 Nicotine dependence, unspecified, uncomplicated: Secondary | ICD-10-CM | POA: Insufficient documentation

## 2012-10-22 MED ORDER — HYDROCODONE-ACETAMINOPHEN 10-325 MG PO TABS: 1.0000 | ORAL_TABLET | Freq: Four times a day (QID) | ORAL | Status: DC | PRN

## 2012-10-22 MED ORDER — METHOCARBAMOL 500 MG PO TABS: 500.0000 mg | ORAL_TABLET | Freq: Three times a day (TID) | ORAL | Status: DC

## 2012-10-22 NOTE — Patient Instructions (Signed)
Try to stay as active as tolerated 

## 2012-10-22 NOTE — Progress Notes (Signed)
Subjective:    Patient ID: Tina [name], female    DOB: 1980/11/26, 32 y.o.   MRN: [id number]  HPI The patient is a 32 year old female , who presents with right leg pain and LBP . The symptoms started 5 years ago, after an accident at her work place, after lifting a heavy object. Hx of discectomy, by Dr. Jillyn Hidden Nov. 2009, not much improvement after the surgery. The patient complains about severe pain , which radiate to the right LE, posteriorly into the thigh. Patient also complains about numbness and tingling in the same area. She describes the pain as a sharp, shooting, sometimes burning pain. Taking medications , changing positions alleviate the symptoms some. Prolonged standing and sitting aggrevates the symptoms. The patient grades his pain as a 6 /10. The problem has been stable.Patient received lumbar ESI, and got  relief from it,after an exacerbation of her LBP in July   Pain Inventory Average Pain 8 Pain Right Now 6 My pain is sharp, burning, stabbing and aching  In the last 24 hours, has pain interfered with the following? General activity 6 Relation with others 8 Enjoyment of life 7 What TIME of day is your pain at its worst? morning evening and night Sleep (in general) Fair  Pain is worse with: walking, sitting, unsure and some activites Pain improves with: rest and medication Relief from Meds: 6  Mobility how many minutes can you walk? 15-20 ability to climb steps?  yes do you drive?  yes  Function employed # of hrs/week 32-40  Neuro/Psych No problems in this area  Prior Studies Any changes since last visit?  no  Physicians involved in your care Any changes since last visit?  no   Family History  Problem Relation Age of Onset  . Depression Mother   . Diabetes Father   . Hypertension Father   . Diabetes Maternal Grandmother     Dialysis  . Hypertension Maternal Grandmother   . Cancer Paternal Grandmother     Breast Cancer  . Cancer Other     colon   . Kidney failure Other     dialysis  . Cancer Other     colon  . Heart disease Maternal Grandmother    History   Social History  . Marital Status: Single    Spouse Name: N/A    Number of Children: 1  . Years of Education: N/A   Occupational History  . Scientist, forensic  . part time student [employer]    Social History Main Topics  . Smoking status: Current Every Day Smoker -- 0.50 packs/day    Types: Cigarettes  . Smokeless tobacco: Never Used  . Alcohol Use: No  . Drug Use: No  . Sexual Activity: None   Other Topics Concern  . None   Social History Narrative   Also raises 38 yo nephew   Past Surgical History  Procedure Laterality Date  . Cesarean section    . Tonsillectomy    . Back surgery  11/09   Past Medical History  Diagnosis Date  . LUMBAR RADICULOPATHY, RIGHT   . HYPERLIPIDEMIA   . ANXIETY   . DEPRESSION   . ADD   . COMMON MIGRAINE   . GERD   . CHOLELITHIASIS   . Pruritus ani   . Unspecified backache   . HYPERSOMNIA   . SMOKER   . 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 Allergic rhinitis, cause unspecified 05/08/2011   BP 130/80  Pulse 80  Resp 14  Ht 5\' 7"  (1.702 m)  Wt 258 lb (117.028 kg)  BMI 40.4 kg/m2  SpO2 100%    Review of Systems  Musculoskeletal: Positive for back pain.  All other systems reviewed and are negative.       Objective:   Physical Exam Constitutional: She is oriented to person, place, and time. She appears well-developed and well-nourished.  Obese  HENT:  Head: Normocephalic.  Neck: Neck supple.  Musculoskeletal: She exhibits tenderness.  Neurological: She is alert and oriented to person, place, and time.  Skin: Skin is warm and dry.  Psychiatric: She has a normal mood and affect.  Symmetric normal motor tone is noted throughout. Normal muscle bulk. Muscle testing reveals 5/5 muscle strength of the upper extremity, and 5/5 of the lower extremity. Full range of motion in upper and lower  extremities. ROM of spine is restricted. Fine motor movements are normal in both hands.  Sensory is intact and symmetric to light touch, pinprick and proprioception.  DTR in the upper and lower extremity are present and symmetric 2+. No clonus is noted.  Patient arises from chair with difficulty. Narrow based gait with normal arm swing bilateral .  Mild left convex lumbar scoliosis        Assessment & Plan:  1. 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   MRI of L-spine,which showed :  Postop laminectomy on the right L5-S1. There is mild right  foraminal narrowing due to spurring, unchanged from the prior  study. No recurrent disc protrusion  Left foraminal disc and osteophyte and L3-4 unchanged from the  prior MRI.   Recommended aquatic PT, after the severe pain of an exacerbation in July had resolved, patient did not do the PT, she states that she never got a call back from PT, but also did not call us about this.  Continue with robaxin and mobic prn  I encouraged aquatic exercise with or without walking, which she did not do. Over time the goal is to reduce her pain medications and try to improve her symptoms with exercising,weight loss and stress reduction. Discussed this with the patient, she has agreed with the plan.  Refilled Hydrocodone. Also encouraged patient to loose some weight, advised her to cut down on her sodas, and try to work out regulary, I recommended walking on the eliptical regulary. Recommended aquatic exercising, to built up strength without putting too much weight on her spine/joints. This would also help to loose weight.  Follow up with PA in 1 month   Hydrocodone DEA control level change discussed and letter given to [place] to read.

## 2012-10-28 ENCOUNTER — Ambulatory Visit: Payer: BC Managed Care – PPO | Admitting: Physical Medicine and Rehabilitation

## 2012-11-18 ENCOUNTER — Other Ambulatory Visit: Payer: Self-pay

## 2012-11-19 ENCOUNTER — Encounter: Payer: BC Managed Care – PPO | Admitting: Physical Medicine and Rehabilitation

## 2012-11-26 ENCOUNTER — Ambulatory Visit: Payer: BC Managed Care – PPO | Admitting: Internal Medicine

## 2012-11-29 ENCOUNTER — Encounter
Payer: BC Managed Care – PPO | Attending: Physical Medicine and Rehabilitation | Admitting: Physical Medicine and Rehabilitation

## 2012-11-29 ENCOUNTER — Encounter: Payer: Self-pay | Admitting: Physical Medicine and Rehabilitation

## 2012-11-29 VITALS — BP 135/83 | HR 90 | Resp 14 | Ht 67.0 in | Wt 258.0 lb

## 2012-11-29 DIAGNOSIS — G894 Chronic pain syndrome: Secondary | ICD-10-CM | POA: Insufficient documentation

## 2012-11-29 DIAGNOSIS — Z5181 Encounter for therapeutic drug level monitoring: Secondary | ICD-10-CM

## 2012-11-29 DIAGNOSIS — M47817 Spondylosis without myelopathy or radiculopathy, lumbosacral region: Secondary | ICD-10-CM | POA: Insufficient documentation

## 2012-11-29 DIAGNOSIS — Z79899 Other long term (current) drug therapy: Secondary | ICD-10-CM

## 2012-11-29 MED ORDER — HYDROCODONE-ACETAMINOPHEN 10-325 MG PO TABS: 1.0000 | ORAL_TABLET | Freq: Four times a day (QID) | ORAL | Status: DC | PRN

## 2012-11-29 NOTE — Patient Instructions (Signed)
Continue with your stretches, try to walk more

## 2012-11-29 NOTE — Progress Notes (Signed)
Subjective:    Patient ID: Tina [name], female    DOB: [birth year], 32 y.o.   MRN: [id number]  HPI The patient is a 32 year old female , who presents with right leg pain and LBP . The symptoms started 5 years ago, after an accident at her work place, after lifting a heavy object. Hx of discectomy, by Dr. Jillyn Hidden Nov. 2009, not much improvement after the surgery. The patient complains about severe pain , which radiate to the right LE, posteriorly into the thigh. Patient also complains about numbness and tingling in the same area. She describes the pain as a sharp, shooting, sometimes burning pain. Taking medications , changing positions alleviate the symptoms some. Prolonged standing and sitting aggrevates the symptoms. The patient grades his pain as a 7 /10. The problem has been stable.Patient received lumbar ESI, and got relief from it,after an exacerbation of her LBP in July  Pain Inventory Average Pain 8 Pain Right Now 7 My pain is sharp, burning, stabbing and aching  In the last 24 hours, has pain interfered with the following? General activity 6 Relation with others 6 Enjoyment of life 6 What TIME of day is your pain at its worst? morning, evening and night Sleep (in general) Fair  Pain is worse with: walking, bending, sitting and some activites Pain improves with: rest, heat/ice and medication Relief from Meds: 6  Mobility walk without assistance how many minutes can you walk? 15-30 ability to climb steps?  yes do you drive?  yes  Function employed # of hrs/week 32  Neuro/Psych No problems in this area  Prior Studies Any changes since last visit?  no  Physicians involved in your care Any changes since last visit?  no   Family History  Problem Relation Age of Onset  . Depression Mother   . Diabetes Father   . Hypertension Father   . Diabetes Maternal Grandmother     Dialysis  . Hypertension Maternal Grandmother   . Cancer Paternal Grandmother     Breast  Cancer  . Cancer Other     colon  . Kidney failure Other     dialysis  . Cancer Other     colon  . Heart disease Maternal Grandmother    History   Social History  . Marital Status: Single    Spouse Name: N/A    Number of Children: 1  . Years of Education: N/A   Occupational History  . Scientist, forensic  . part time student [employer]    Social History Main Topics  . Smoking status: Current Every Day Smoker -- 0.50 packs/day    Types: Cigarettes  . Smokeless tobacco: Never Used  . Alcohol Use: No  . Drug Use: No  . Sexual Activity: None   Other Topics Concern  . None   Social History Narrative   Also raises 22 yo nephew   Past Surgical History  Procedure Laterality Date  . Cesarean section    . Tonsillectomy    . Back surgery  11/09   Past Medical History  Diagnosis Date  . LUMBAR RADICULOPATHY, RIGHT   . HYPERLIPIDEMIA   . ANXIETY   . DEPRESSION   . ADD   . COMMON MIGRAINE   . GERD   . CHOLELITHIASIS   . Pruritus ani   . Unspecified backache   . HYPERSOMNIA   . SMOKER   . 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 Allergic rhinitis, cause unspecified 05/08/2011   BP  135/83  Pulse 90  Resp 14  Ht 5\' 7"  (1.702 m)  Wt 258 lb (117.028 kg)  BMI 40.40 kg/m2  SpO2 100%     Review of Systems  All other systems reviewed and are negative.       Objective:   Physical Exam  Constitutional: She is oriented to person, place, and time. She appears well-developed and well-nourished.  Obese  HENT:  Head: Normocephalic.  Neck: Neck supple.  Musculoskeletal: She exhibits tenderness.  Neurological: She is alert and oriented to person, place, and time.  Skin: Skin is warm and dry.  Psychiatric: She has a normal mood and affect.  Symmetric normal motor tone is noted throughout. Normal muscle bulk. Muscle testing reveals 5/5 muscle strength of the upper extremity, and 5/5 of the lower extremity. Full range of motion in upper and lower extremities.  ROM of spine is restricted. Fine motor movements are normal in both hands.  Sensory is intact and symmetric to light touch, pinprick and proprioception.  DTR in the upper and lower extremity are present and symmetric 2+. No clonus is noted.  Patient arises from chair with difficulty. Narrow based gait with normal arm swing bilateral .  Mild left convex lumbar scoliosis       Assessment & Plan:  1. 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  MRI of L-spine,which showed :  Postop laminectomy on the right L5-S1. There is mild right  foraminal narrowing due to spurring, unchanged from the prior  study. No recurrent disc protrusion  Left foraminal disc and osteophyte and L3-4 unchanged from the  prior MRI.  Recommended aquatic PT, after the severe pain of an exacerbation in July had resolved, patient did not do the PT, she states that she never got a call back from PT, but also did not call us about this.  Continue with robaxin and mobic prn  I encouraged aquatic exercise with or without walking, which she did not do. Over time the goal is to reduce her pain medications and try to improve her symptoms with exercising,weight loss and stress reduction. Discussed this with the patient, she has agreed with the plan.  Refilled Hydrocodone. Also encouraged patient to loose some weight, advised her to cut down on her sodas, and try to work out regulary, I recommended walking on the eliptical regulary. Recommended aquatic exercising, to built up strength without putting too much weight on her spine/joints. This would also help to loose weight.  Follow up with PA in 1 month  Hydrocodone DEA control level change discussed and letter given to [place] to read.

## 2012-12-06 ENCOUNTER — Telehealth: Payer: Self-pay | Admitting: *Deleted

## 2012-12-06 NOTE — Telephone Encounter (Signed)
Message copied by Doreene Eland on Mon Dec 06, 2012  3:54 PM ------      Message from: Tina [name]      Created: Mon Dec 06, 2012 12:38 PM       Patient has to be NON narcotic . If she wants to come back for injections that is fine ------

## 2012-12-06 NOTE — Telephone Encounter (Signed)
Left message to return call to our office about UDS result.

## 2012-12-07 NOTE — Telephone Encounter (Signed)
2nd attempt to reach patient.  No message left since one was left 12/06/12

## 2012-12-07 NOTE — Telephone Encounter (Signed)
Spoke with [place] about UDS and non narcotic treatment only.  She replied "ok"

## 2012-12-07 NOTE — Progress Notes (Signed)
agree

## 2012-12-10 ENCOUNTER — Ambulatory Visit: Payer: BC Managed Care – PPO | Admitting: Family Medicine

## 2012-12-10 ENCOUNTER — Ambulatory Visit: Payer: BC Managed Care – PPO

## 2012-12-10 VITALS — BP 122/78 | HR 96 | Temp 99.0°F | Resp 18 | Ht 67.0 in | Wt 250.0 lb

## 2012-12-10 DIAGNOSIS — M79609 Pain in unspecified limb: Secondary | ICD-10-CM

## 2012-12-10 DIAGNOSIS — S60229A Contusion of unspecified hand, initial encounter: Secondary | ICD-10-CM

## 2012-12-10 DIAGNOSIS — S60221A Contusion of right hand, initial encounter: Secondary | ICD-10-CM

## 2012-12-10 DIAGNOSIS — M79641 Pain in right hand: Secondary | ICD-10-CM

## 2012-12-10 NOTE — Progress Notes (Signed)
[facility] [address] [place] [address] [phone]  Date:  12/10/2012   Name:  Tina [name]   DOB:  [birth year]   MRN:  [id number]  PCP:  Oliver Barre, MD    Chief Complaint: Finger Injury   History of Present Illness:  Tina [name] is a 32 y.o. very pleasant female patient who presents with the following:  She was playing yesterday with her family- she hit her right index finger on either the railing or the door. She had some swelling and pain that has gotten worse overnight.   It is stil swollen and painful today.   Otherwise she is ok today. No other injury or concern  LMP was last week  Last tetanus shot was in 2011.   Patient Active Problem List   Diagnosis Date Noted  . Venereal disease, unspecified 06/23/2012  . Cervicalgia 04/21/2012  . 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 Postlaminectomy syndrome, lumbar region 06/26/2011  . Lumbago 05/09/2011  . 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 Seasonal and perennial allergic rhinitis 05/08/2011  . Sacroiliac dysfunction 04/07/2011  . Lumbosacral spondylosis without myelopathy 03/10/2011  . Dizziness and giddiness 11/02/2010  . Preventative health care 04/26/2010  . SMOKER 03/29/2010  . HYPERSOMNIA 02/08/2010  . HYPERLIPIDEMIA 04/26/2009  . ADD 04/26/2009  . Pruritus ani 04/26/2009  . ANXIETY 02/01/2009  . DEPRESSION 02/01/2009  . COMMON MIGRAINE 02/01/2009  . GERD 02/01/2009  . CHOLELITHIASIS 02/01/2009    Past Medical History  Diagnosis Date  . LUMBAR RADICULOPATHY, RIGHT   . HYPERLIPIDEMIA   . ANXIETY   . DEPRESSION   . ADD   . COMMON MIGRAINE   . GERD   . CHOLELITHIASIS   . Pruritus ani   . Unspecified backache   . HYPERSOMNIA   . SMOKER   . 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 Allergic rhinitis, cause unspecified 05/08/2011    Past Surgical History  Procedure Laterality Date  . Cesarean section    . Tonsillectomy    . Back  surgery  11/09    History  Substance Use Topics  . Smoking status: Current Every Day Smoker -- 0.50 packs/day    Types: Cigarettes  . Smokeless tobacco: Never Used  . Alcohol Use: No    Family History  Problem Relation Age of Onset  . Depression Mother   . Mental illness Mother   . Diabetes Father   . Hypertension Father   . Hyperlipidemia Father   . Diabetes Maternal Grandmother     Dialysis  . Hypertension Maternal Grandmother   . Heart disease Maternal Grandmother   . Cancer Paternal Grandmother     Breast Cancer  . Cancer Other     colon  . Kidney failure Other     dialysis  . Cancer Other     colon  . Diabetes Brother     Allergies  Allergen Reactions  . Percocet [Oxycodone-Acetaminophen] Itching    SEVERE ITCHING  . Tramadol     MIGRAINE    Medication list has been reviewed and updated.  Current Outpatient Prescriptions on File Prior to Visit  Medication Sig Dispense Refill  . amphetamine-dextroamphetamine (ADDERALL) 20 MG tablet Take 1 tablet (20 mg total) by mouth 2 (two) times daily.  60 tablet  0  . gabapentin (NEURONTIN) 300 MG capsule Take 1 capsule (300 mg total) by mouth 3 (three) times daily. Take one tablet at bedtime for 3 days then  add another tablet in the am, after 3 more days add another one at noon  90 capsule  0  . HYDROcodone-acetaminophen (NORCO) 10-325 MG per tablet Take 1 tablet by mouth every 6 (six) hours as needed.  120 tablet  0  . lidocaine (LIDODERM) 5 % Place 1 patch onto the skin daily. Remove & Discard patch within 12 hours or as directed by MD  90 patch  1  . meloxicam (MOBIC) 15 MG tablet Take 1 tablet (15 mg total) by mouth daily.  30 tablet  2  . methocarbamol (ROBAXIN) 500 MG tablet Take 1 tablet (500 mg total) by mouth 3 (three) times daily.  90 tablet  1  . omeprazole (PRILOSEC) 20 MG capsule Take 20 mg by mouth daily.        . SUMAtriptan (IMITREX) 100 MG tablet Take 1 tablet (100 mg total) by mouth every 2 (two) hours as  needed for migraine.  30 tablet  0  . clonazePAM (KLONOPIN) 0.5 MG tablet Take 1 tablet (0.5 mg total) by mouth 2 (two) times daily as needed for anxiety.  60 tablet  1   No current facility-administered medications on file prior to visit.    Review of Systems:  As per HPI- otherwise negative.   Physical Examination: Filed Vitals:   12/10/12 1342  BP: 122/78  Pulse: 96  Temp: 99 F (37.2 C)  Resp: 18   Filed Vitals:   12/10/12 1342  Height: 5\' 7"  (1.702 m)  Weight: 250 lb (113.399 kg)   Body mass index is 39.15 kg/(m^2). Ideal Body Weight: Weight in (lb) to have BMI = 25: 159.3  GEN: WDWN, NAD, Non-toxic, A & O x 3, overweight, looks well HEENT: Atraumatic, Normocephalic. Neck supple. No masses, No LAD. Ears and Nose: No external deformity. CV: RRR, No M/G/R. No JVD. No thrill. No extra heart sounds. PULM: CTA B, no wheezes, crackles, rhonchi. No retractions. No resp. distress. No accessory muscle use. EXTR: No c/c/e NEURO Normal gait.  PSYCH: Normally interactive. Conversant. Not depressed or anxious appearing.  Calm demeanor.  Right UE: elbow and wrist negative.  She has tenderness, bruise and minimal swelling over the proximal phalanx of the right index finger. She has limited flexion due to pain.  Normal cap refill and sensation.  There is a small abrasion over the dorsal finger without sx of infection  [facility] reading (PRIMARY) by  Dr. Patsy Lager. Right hand: negative RIGHT HAND - COMPLETE 3+ VIEW  COMPARISON: No priors.  FINDINGS: Multiple views of the right hand demonstrate no acute displaced fracture, subluxation, dislocation, or soft tissue abnormality.  IMPRESSION: No acute radiographic abnormality of the right hand.   Placed in a finger splint and wrapped with coban  Assessment and Plan: Hand pain, right - Plan: DG Hand Complete Right  Contusion, hand, right, initial encounter  Finger contusion.  Splint for protection and pain relief.  Follow-up if not  better in the next few days- Sooner if worse.     Signed Abbe Amsterdam, MD

## 2012-12-10 NOTE — Patient Instructions (Signed)
Wear splint as needed for the next 3 or 4 days.  If your finger then feels better you are likely ok.  However if you are not better or if you get worse please continue the splint and give me a call.

## 2013-02-15 ENCOUNTER — Telehealth: Payer: Self-pay | Admitting: *Deleted

## 2013-02-15 DIAGNOSIS — G43901 Migraine, unspecified, not intractable, with status migrainosus: Secondary | ICD-10-CM

## 2013-02-15 MED ORDER — AMPHETAMINE-DEXTROAMPHETAMINE 20 MG PO TABS: 20.0000 mg | ORAL_TABLET | Freq: Two times a day (BID) | ORAL | Status: DC

## 2013-02-15 MED ORDER — SUMATRIPTAN SUCCINATE 100 MG PO TABS: 100.0000 mg | ORAL_TABLET | ORAL | Status: DC | PRN

## 2013-02-15 NOTE — Telephone Encounter (Signed)
Patient phoned requesting refills for her imitrex & adderall.  Last OV with PCP 12/10/12 Edilia Bo) and meds last filled 04/30/12 and 08/19/12.  Please advise.   CB# [phone]

## 2013-02-15 NOTE — Telephone Encounter (Signed)
imitex done erx  adderall - Done hardcopy to robin

## 2013-02-16 NOTE — Telephone Encounter (Signed)
Called the patient left a detailed message script is ready for pickup at the front desk and that other request has been sent in.

## 2013-09-17 ENCOUNTER — Encounter (HOSPITAL_COMMUNITY): Payer: Self-pay | Admitting: *Deleted

## 2013-09-17 ENCOUNTER — Inpatient Hospital Stay (HOSPITAL_COMMUNITY)
Admission: AD | Admit: 2013-09-17 | Discharge: 2013-09-17 | Disposition: A | Discharge status: Home / Self Care | Payer: BC Managed Care – PPO | Source: Ambulatory Visit | Attending: Family Medicine | Admitting: Family Medicine

## 2013-09-17 DIAGNOSIS — N751 Abscess of Bartholin's gland: Secondary | ICD-10-CM | POA: Diagnosis not present

## 2013-09-17 LAB — URINALYSIS, ROUTINE W REFLEX MICROSCOPIC
BILIRUBIN URINE: NEGATIVE
GLUCOSE, UA: NEGATIVE mg/dL
KETONES UR: NEGATIVE mg/dL
Nitrite: NEGATIVE
PROTEIN: NEGATIVE mg/dL
Specific Gravity, Urine: 1.02 (ref 1.005–1.030)
Urobilinogen, UA: 0.2 mg/dL (ref 0.0–1.0)
pH: 6 (ref 5.0–8.0)

## 2013-09-17 LAB — URINE MICROSCOPIC-ADD ON

## 2013-09-17 MED ORDER — SULFAMETHOXAZOLE-TRIMETHOPRIM 800-160 MG PO TABS: 1.0000 | ORAL_TABLET | Freq: Two times a day (BID) | ORAL | Status: AC

## 2013-09-17 MED ORDER — HYDROCODONE-ACETAMINOPHEN 5-325 MG PO TABS: 1.0000 | ORAL_TABLET | Freq: Four times a day (QID) | ORAL | Status: DC | PRN

## 2013-09-17 NOTE — MAU Provider Note (Signed)
History     CSN: [id number]  Arrival date and time: 09/17/13 1243   First Provider Initiated Contact with Patient 09/17/13 1310      Chief Complaint  Patient presents with  . Cyst   HPI Ms. Tina [name] is a 33 y.o. G2P1011 who presents to MAU today with complaint of a painful lump on her vulvar area. She states that she first noted the lump yesterday morning and it became painful last night. She feels that it has increased in size. She denies drainage, vaginal discharge, bleeding or fever.   OB History   Grav Para Term Preterm Abortions TAB SAB Ect Mult Living   2 1 1  1  1   1       Past Medical History  Diagnosis Date  . LUMBAR RADICULOPATHY, RIGHT   . HYPERLIPIDEMIA   . ANXIETY   . DEPRESSION   . ADD   . COMMON MIGRAINE   . GERD   . CHOLELITHIASIS   . Pruritus ani   . Unspecified backache   . HYPERSOMNIA   . SMOKER   . 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 Allergic rhinitis, cause unspecified 05/08/2011    Past Surgical History  Procedure Laterality Date  . Cesarean section    . Tonsillectomy    . Back surgery  11/09    Family History  Problem Relation Age of Onset  . Depression Mother   . Mental illness Mother   . Diabetes Father   . Hypertension Father   . Hyperlipidemia Father   . Diabetes Maternal Grandmother     Dialysis  . Hypertension Maternal Grandmother   . Heart disease Maternal Grandmother   . Cancer Paternal Grandmother     Breast Cancer  . Cancer Other     colon  . Kidney failure Other     dialysis  . Cancer Other     colon  . Diabetes Brother     History  Substance Use Topics  . Smoking status: Current Every Day Smoker -- 0.50 packs/day    Types: Cigarettes  . Smokeless tobacco: Never Used  . Alcohol Use: No    Allergies:  Allergies  Allergen Reactions  . Percocet [Oxycodone-Acetaminophen] Itching    SEVERE ITCHING  . Tramadol     MIGRAINE    No prescriptions prior to admission    Review of  Systems  Constitutional: Negative for fever and malaise/fatigue.  Genitourinary:       Neg -vaginal bleeding, discharge   Physical Exam   Blood pressure 135/86, pulse 104, temperature 98.4 F (36.9 C), temperature source Oral, resp. rate 20, height 5' 6.5" (1.689 m), weight 244 lb 8 oz (110.904 kg), last menstrual period 08/26/2013.  Physical Exam  Vitals reviewed. Constitutional: She is oriented to person, place, and time. She appears well-developed and well-nourished.  HENT:  Head: Normocephalic.  Cardiovascular: Normal rate.   Respiratory: Effort normal.  Genitourinary:    There is tenderness on the right labia. There is no rash or lesion on the right labia. No vaginal discharge found.  Neurological: She is alert and oriented to person, place, and time.  Skin: Skin is warm and dry. No erythema.  Psychiatric: She has a normal mood and affect.   Results for orders placed during the hospital encounter of 09/17/13 (from the past 24 hour(s))  URINALYSIS, ROUTINE W REFLEX MICROSCOPIC     Status: Abnormal   Collection Time    09/17/13 12:53 PM  Result Value Ref Range   Color, Urine YELLOW  YELLOW   APPearance HAZY (*) CLEAR   Specific Gravity, Urine 1.020  1.005 - 1.030   pH 6.0  5.0 - 8.0   Glucose, UA NEGATIVE  NEGATIVE mg/dL   Hgb urine dipstick SMALL (*) NEGATIVE   Bilirubin Urine NEGATIVE  NEGATIVE   Ketones, ur NEGATIVE  NEGATIVE mg/dL   Protein, ur NEGATIVE  NEGATIVE mg/dL   Urobilinogen, UA 0.2  0.0 - 1.0 mg/dL   Nitrite NEGATIVE  NEGATIVE   Leukocytes, UA MODERATE (*) NEGATIVE  URINE MICROSCOPIC-ADD ON     Status: Abnormal   Collection Time    09/17/13 12:53 PM      Result Value Ref Range   Squamous Epithelial / LPF MANY (*) RARE   WBC, UA 11-20  <3 WBC/hpf   RBC / HPF 3-6  <3 RBC/hpf   Bacteria, UA MANY (*) RARE   Urine-Other MUCOUS PRESENT      MAU Course  Procedures None  MDM Discussed option of I&D vs antibiotics, warms compresses and sitz baths  to promote spontaneous drainage.  Patient would prefer to allow for spontaneous drainage  Assessment and Plan  A: Bartholin's gland abscess  P: Discharge home Rx for Vicodin and Bactrim given to patient Advised to use warm compresses and sitz baths to promote drainage Warning signs for infection discussed Patient may return to MAU as needed or if her condition were to change or worsen   Luvenia Redden, PA-C  09/17/2013, 2:44 PM

## 2013-09-17 NOTE — MAU Note (Signed)
Patient presents with complaint of a vaginal cyst that she first noticed last night and now it has enlarged and is hurting.

## 2013-09-17 NOTE — Discharge Instructions (Signed)
Bartholin's Cyst or Abscess °Bartholin's glands are small glands located within the folds of skin (labia) along the sides of the lower opening of the vagina (birth canal). A cyst may develop when the duct of the gland becomes blocked. When this happens, fluid that accumulates within the cyst can become infected. This is known as an abscess. The Bartholin gland produces a mucous fluid to lubricate the outside of the vagina during sexual intercourse. °SYMPTOMS  °· Patients with a small cyst may not have any symptoms. °· Mild discomfort to severe pain depending on the size of the cyst and if it is infected (abscess). °· Pain, redness, and swelling around the lower opening of the vagina. °· Painful intercourse. °· Pressure in the perineal area. °· Swelling of the lips of the vagina (labia). °· The cyst or abscess can be on one side or both sides of the vagina. °DIAGNOSIS  °· A large swelling is seen in the lower vagina area by your caregiver. °· Painful to touch. °· Redness and pain, if it is an abscess. °TREATMENT  °· Sometimes the cyst will go away on its own. °· Apply warm wet compresses to the area or take hot sitz baths several times a day. °· An incision to drain the cyst or abscess with local anesthesia. °· Culture the pus, if it is an abscess. °· Antibiotic treatment, if it is an abscess. °· Cut open the gland and suture the edges to make the opening of the gland bigger (marsupialization). °· Remove the whole gland if the cyst or abscess returns. °PREVENTION  °· Practice good hygiene. °· Clean the vaginal area with a mild soap and soft cloth when bathing. °· Do not rub hard in the vaginal area when bathing. °· Protect the crotch area with a padded cushion if you take long bike rides or ride horses. °· Be sure you are well lubricated when you have sexual intercourse. °HOME CARE INSTRUCTIONS  °· If your cyst or abscess was opened, a small piece of gauze, or a drain, may have been placed in the wound to allow  drainage. Do not remove this gauze or drain unless directed by your caregiver. °· Wear feminine pads, not tampons, as needed for any drainage or bleeding. °· If antibiotics were prescribed, take them exactly as directed. Finish the entire course. °· Only take over-the-counter or prescription medicines for pain, discomfort, or fever as directed by your caregiver. °SEEK IMMEDIATE MEDICAL CARE IF:  °· You have an increase in pain, redness, swelling, or drainage. °· You have bleeding from the wound which results in the use of more than the number of pads suggested by your caregiver in 24 hours. °· You have chills. °· You have a fever. °· You develop any new problems (symptoms) or aggravation of your existing condition. °MAKE SURE YOU:  °· Understand these instructions. °· Will watch your condition. °· Will get help right away if you are not doing well or get worse. °Document Released: 12/30/2004 Document Revised: 03/24/2011 Document Reviewed: 08/18/2007 °ExitCare® Patient Information ©2015 [employer]. This information is not intended to replace advice given to you by your health care provider. Make sure you discuss any questions you have with your health care provider. ° °

## 2013-09-17 NOTE — MAU Provider Note (Signed)
Attestation of Attending Supervision of Advanced Practitioner (PA/CNM/NP): Evaluation and management procedures were performed by the Advanced Practitioner under my supervision and collaboration.  I have reviewed the Advanced Practitioner's note and chart, and I agree with the management and plan.  Donnamae Jude, MD [facility] Attending 09/17/2013 3:57 PM

## 2013-09-18 LAB — URINE CULTURE

## 2013-10-28 ENCOUNTER — Other Ambulatory Visit: Payer: Self-pay

## 2013-10-30 ENCOUNTER — Emergency Department (HOSPITAL_COMMUNITY)
Admission: EM | Admit: 2013-10-30 | Discharge: 2013-10-30 | Disposition: A | Discharge status: Home / Self Care | Payer: BC Managed Care – PPO | Attending: Emergency Medicine | Admitting: Emergency Medicine

## 2013-10-30 ENCOUNTER — Encounter (HOSPITAL_COMMUNITY): Payer: Self-pay | Admitting: Emergency Medicine

## 2013-10-30 DIAGNOSIS — R21 Rash and other nonspecific skin eruption: Secondary | ICD-10-CM | POA: Insufficient documentation

## 2013-10-30 DIAGNOSIS — Z8639 Personal history of other endocrine, nutritional and metabolic disease: Secondary | ICD-10-CM | POA: Insufficient documentation

## 2013-10-30 DIAGNOSIS — Z8659 Personal history of other mental and behavioral disorders: Secondary | ICD-10-CM | POA: Insufficient documentation

## 2013-10-30 DIAGNOSIS — L02416 Cutaneous abscess of left lower limb: Secondary | ICD-10-CM | POA: Insufficient documentation

## 2013-10-30 DIAGNOSIS — Z8739 Personal history of other diseases of the musculoskeletal system and connective tissue: Secondary | ICD-10-CM | POA: Insufficient documentation

## 2013-10-30 DIAGNOSIS — Z720 Tobacco use: Secondary | ICD-10-CM | POA: Insufficient documentation

## 2013-10-30 DIAGNOSIS — K219 Gastro-esophageal reflux disease without esophagitis: Secondary | ICD-10-CM | POA: Insufficient documentation

## 2013-10-30 DIAGNOSIS — Z79899 Other long term (current) drug therapy: Secondary | ICD-10-CM | POA: Insufficient documentation

## 2013-10-30 MED ORDER — LIDOCAINE-EPINEPHRINE 2 %-1:100000 IJ SOLN
20.0000 mL | Freq: Once | INTRAMUSCULAR | Status: AC
  Administered 2013-10-30: 20 mL
  Filled 2013-10-30: qty 1

## 2013-10-30 MED ORDER — KETOROLAC TROMETHAMINE 10 MG PO TABS
10.0000 mg | ORAL_TABLET | Freq: Once | ORAL | Status: AC
  Administered 2013-10-30: 10 mg via ORAL
  Filled 2013-10-30: qty 1

## 2013-10-30 MED ORDER — SULFAMETHOXAZOLE-TMP DS 800-160 MG PO TABS
1.0000 | ORAL_TABLET | Freq: Once | ORAL | Status: AC
  Administered 2013-10-30: 1 via ORAL
  Filled 2013-10-30: qty 1

## 2013-10-30 MED ORDER — SULFAMETHOXAZOLE-TRIMETHOPRIM 800-160 MG PO TABS: 1.0000 | ORAL_TABLET | Freq: Two times a day (BID) | ORAL | Status: DC

## 2013-10-30 NOTE — ED Provider Notes (Signed)
CSN: [id number]     Arrival date & time 10/30/13  1908 History   First MD Initiated Contact with Patient 10/30/13 2017     Chief Complaint  Patient presents with  . Abscess     (Consider location/radiation/quality/duration/timing/severity/associated sxs/prior Treatment) The history is provided by the patient and medical records. No language interpreter was used.    Tina [name] is a 33 y.o. female  with a hx of arthritis, GERD presents to the Emergency Department complaining of gradual, persistent, progressively worsening swelling, redness onset 5 days ago.  Pt reports always having a small cyst on the anterior proximal shin without pain or irritation.   5 days ago the site became warm, red and painful.  She denies falling, bumping the cyst or known trauma.  Pt denies HIV, steroid usage, DM.  Pt reports using ice and icy hot without relief.  Nothing makes it better and palpation makes it worse.  Pt denies fever, chills, nausea, vomiting.  Pt denies pain to the knee itself.     Past Medical History  Diagnosis Date  . LUMBAR RADICULOPATHY, RIGHT   . HYPERLIPIDEMIA   . ANXIETY   . DEPRESSION   . ADD   . COMMON MIGRAINE   . GERD   . CHOLELITHIASIS   . Pruritus ani   . Unspecified backache   . HYPERSOMNIA   . SMOKER   . 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 Allergic rhinitis, cause unspecified 05/08/2011   Past Surgical History  Procedure Laterality Date  . Cesarean section    . Tonsillectomy    . Back surgery  11/09   Family History  Problem Relation Age of Onset  . Depression Mother   . Mental illness Mother   . Diabetes Father   . Hypertension Father   . Hyperlipidemia Father   . Diabetes Maternal Grandmother     Dialysis  . Hypertension Maternal Grandmother   . Heart disease Maternal Grandmother   . Cancer Paternal Grandmother     Breast Cancer  . Cancer Other     colon  . Kidney failure Other     dialysis  . Cancer Other     colon  .  Diabetes Brother    History  Substance Use Topics  . Smoking status: Current Every Day Smoker -- 0.50 packs/day    Types: Cigarettes  . Smokeless tobacco: Never Used  . Alcohol Use: No   OB History   Grav Para Term Preterm Abortions TAB SAB Ect Mult Living   2 1 1  1  1   1      Review of Systems  Constitutional: Negative for fever and chills.  Gastrointestinal: Negative for nausea and vomiting.  Skin: Positive for rash.  Allergic/Immunologic: Negative for immunocompromised state.  Hematological: Does not bruise/bleed easily.  Psychiatric/Behavioral: The patient is not nervous/anxious.       Allergies  Percocet and Tramadol  Home Medications   Prior to Admission medications   Medication Sig Start Date End Date Taking? Authorizing Provider  amphetamine-dextroamphetamine (ADDERALL) 20 MG tablet Take 1 tablet (20 mg total) by mouth 2 (two) times daily. 02/15/13  Yes Biagio Borg, MD  HYDROcodone-acetaminophen (NORCO/VICODIN) 5-325 MG per tablet Take 1 tablet by mouth every 6 (six) hours as needed for severe pain (pain).   Yes Historical Provider, MD  ibuprofen (ADVIL,MOTRIN) 200 MG tablet Take 400 mg by mouth every 6 (six) hours as needed (knee pain).   Yes Historical Provider, MD  omeprazole (PRILOSEC) 20 MG capsule Take 20 mg by mouth daily.     Yes Historical Provider, MD  sulfamethoxazole-trimethoprim (SEPTRA DS) 800-160 MG per tablet Take 1 tablet by mouth every 12 (twelve) hours. 10/30/13   Clint Biello, PA-C   BP 115/66  Pulse 80  Temp(Src) 98.4 F (36.9 C) (Oral)  Resp 18  SpO2 100%  LMP 10/24/2013 Physical Exam  Nursing note and vitals reviewed. Constitutional: She is oriented to person, place, and time. She appears well-developed and well-nourished. No distress.  HENT:  Head: Normocephalic and atraumatic.  Eyes: Conjunctivae are normal. No scleral icterus.  Neck: Normal range of motion.  Cardiovascular: Normal rate, regular rhythm, normal heart sounds  and intact distal pulses.   No murmur heard. Pulmonary/Chest: Effort normal and breath sounds normal.  Abdominal: Soft. She exhibits no distension. There is no tenderness.  Lymphadenopathy:    She has no cervical adenopathy.  Neurological: She is alert and oriented to person, place, and time.  Skin: Skin is warm and dry. She is not diaphoretic. There is erythema.  4x3cm area of fluctuance to the proximal shin with mild surrounding erythema without extending induration  Psychiatric: She has a normal mood and affect.    ED Course  INCISION AND DRAINAGE Date/Time: 10/30/2013 10:22 PM Performed by: Abigail Butts Authorized by: Abigail Butts Consent: Verbal consent obtained. Risks and benefits: risks, benefits and alternatives were discussed Consent given by: patient and parent Patient understanding: patient states understanding of the procedure being performed Patient consent: the patient's understanding of the procedure matches consent given Procedure consent: procedure consent matches procedure scheduled Relevant documents: relevant documents present and verified Site marked: the operative site was marked Imaging studies: imaging studies available (bedside US) Required items: required blood products, implants, devices, and special equipment available Patient identity confirmed: verbally with patient and arm band Time out: Immediately prior to procedure a "time out" was called to verify the correct patient, procedure, equipment, support staff and site/side marked as required. Type: abscess Body area: lower extremity Location details: left leg Anesthesia: local infiltration Local anesthetic: lidocaine 2% with epinephrine Anesthetic total: 2 ml Patient sedated: no Risk factor: underlying major vessel Scalpel size: 11 Incision type: elliptical Complexity: simple Drainage: purulent Drainage amount: copious Wound treatment: wound left open Packing material:  none Patient tolerance: Patient tolerated the procedure well with no immediate complications.   (including critical care time) Labs Review Labs Reviewed - No data to display  Imaging Review No results found.   EKG Interpretation None      EMERGENCY DEPARTMENT US SOFT TISSUE INTERPRETATION "Study: Limited Ultrasound of the noted body part in comments below"  INDICATIONS: Pain and Soft tissue infection Multiple views of the body part are obtained with a multi-frequency linear probe  PERFORMED BY:  Myself  IMAGES ARCHIVED?: Yes  SIDE:Left  BODY PART:Lower extremity  FINDINGS: Abcess present  LIMITATIONS:  Body Habitus  INTERPRETATION:  Abcess present and No cellulitis noted  COMMENT:  Large abscess with solid matter seen; cystic in nature    MDM   Final diagnoses:  Abscess of left leg excluding foot   Tina [name] presents with discrete abscess to the left lower leg with surrounding erythema but without extending induration.  Patient with skin abscess amenable to incision and drainage.  Abscess was not large enough to warrant packing or drain,  wound recheck in 2 days. Encouraged home warm soaks and flushing.  Mild signs of cellulitis is surrounding skin.  Will d/c to  home with bactrim.   BP 115/66  Pulse 80  Temp(Src) 98.4 F (36.9 C) (Oral)  Resp 18  SpO2 100%  LMP 10/24/2013     Abigail Butts, PA-C 10/30/13 2319

## 2013-10-30 NOTE — ED Notes (Signed)
Pt reports she has always had a small bump on her left lower knee area, but swelling and redness increased last Monday. Pain 7/10. Pt has redness extending to mid shin area.

## 2013-10-30 NOTE — Discharge Instructions (Signed)
1. Medications: bactrim, usual home medications 2. Treatment: rest, drink plenty of fluids,  3. Follow Up: Please followup with your primary doctor, the ED or an urgent care in 2 days for discussion of your diagnoses and further evaluation after today's visit; if you do not have a primary care doctor use the resource guide provided to find one;     Abscess An abscess is an infected area that contains a collection of pus and debris.It can occur in almost any part of the body. An abscess is also known as a furuncle or boil. CAUSES  An abscess occurs when tissue gets infected. This can occur from blockage of oil or sweat glands, infection of hair follicles, or a minor injury to the skin. As the body tries to fight the infection, pus collects in the area and creates pressure under the skin. This pressure causes pain. People with weakened immune systems have difficulty fighting infections and get certain abscesses more often.  SYMPTOMS Usually an abscess develops on the skin and becomes a painful mass that is red, warm, and tender. If the abscess forms under the skin, you may feel a moveable soft area under the skin. Some abscesses break open (rupture) on their own, but most will continue to get worse without care. The infection can spread deeper into the body and eventually into the bloodstream, causing you to feel ill.  DIAGNOSIS  Your caregiver will take your medical history and perform a physical exam. A sample of fluid may also be taken from the abscess to determine what is causing your infection. TREATMENT  Your caregiver may prescribe antibiotic medicines to fight the infection. However, taking antibiotics alone usually does not cure an abscess. Your caregiver may need to make a small cut (incision) in the abscess to drain the pus. In some cases, gauze is packed into the abscess to reduce pain and to continue draining the area. HOME CARE INSTRUCTIONS   Only take over-the-counter or prescription  medicines for pain, discomfort, or fever as directed by your caregiver.  If you were prescribed antibiotics, take them as directed. Finish them even if you start to feel better.  If gauze is used, follow your caregiver's directions for changing the gauze.  To avoid spreading the infection:  Keep your draining abscess covered with a bandage.  Wash your hands well.  Do not share personal care items, towels, or whirlpools with others.  Avoid skin contact with others.  Keep your skin and clothes clean around the abscess.  Keep all follow-up appointments as directed by your caregiver. SEEK MEDICAL CARE IF:   You have increased pain, swelling, redness, fluid drainage, or bleeding.  You have muscle aches, chills, or a general ill feeling.  You have a fever. MAKE SURE YOU:   Understand these instructions.  Will watch your condition.  Will get help right away if you are not doing well or get worse. Document Released: 10/09/2004 Document Revised: 07/01/2011 Document Reviewed: 03/14/2011 [facility] Patient Information 2015 [place], [place]. This information is not intended to replace advice given to you by your health care provider. Make sure you discuss any questions you have with your health care provider.    Emergency Department Resource Guide 1) Find a Doctor and Pay Out of Pocket Although you won't have to find out who is covered by your insurance plan, it is a good idea to ask around and get recommendations. You will then need to call the office and see if the doctor you have  chosen will accept you as a new patient and what types of options they offer for patients who are self-pay. Some doctors offer discounts or will set up payment plans for their patients who do not have insurance, but you will need to ask so you aren't surprised when you get to your appointment.  2) Contact Your Local Health Department Not all health departments have doctors that can see patients for sick  visits, but many do, so it is worth a call to see if yours does. If you don't know where your local health department is, you can check in your phone book. The CDC also has a tool to help you locate your state's health department, and many state websites also have listings of all of their local health departments.  3) Find a [facility] If your illness is not likely to be very severe or complicated, you may want to try a walk in clinic. These are popping up all over the country in pharmacies, drugstores, and shopping centers. They're usually staffed by nurse practitioners or physician assistants that have been trained to treat common illnesses and complaints. They're usually fairly quick and inexpensive. However, if you have serious medical issues or chronic medical problems, these are probably not your best option.  No Primary Care Doctor: - Call Health Connect at  [phone] - they can help you locate a primary care doctor that  accepts your insurance, provides certain services, etc. - Physician Referral Service- [phone]  Chronic Pain Problems: Organization         Address  Phone   Notes  [facility]  [phone] Patients need to be referred by their primary care doctor.   Medication Assistance: Organization         Address  Phone   Notes  [facility] Medication [facility] [place] [place], [place] [address] [phone] --Must be a resident of [facility] -- Must have NO insurance coverage whatsoever (no Medicaid/ Medicare, etc.) -- The pt. MUST have a primary care doctor that directs their care regularly and follows them in the community   MedAssist  [phone]   [employer]  [phone]    Agencies that provide inexpensive medical care: Organization         Address  Phone   Notes  East   [phone]   [facility]    [phone]   [facility] [place], [place] [address] [phone]   [place] [address] [place] [phone]   Planned Parenthood    [phone]   [facility]    [phone]   [place] and [address], [place] Phone:  [phone], Fax:  [phone] Hours of Operation:  9 am - 6 pm, M-F.  Also accepts Medicaid/Medicare and self-pay.  [facility] for [place], [address], [place] Phone: [phone], Fax: [phone]. Hours of Operation:  8:30 am - 5:30 pm, M-F.  Also accepts Medicaid and self-pay.  [facility] [address], [place] Phone: [phone]   [place], [place], [place] [phone], Ext. [phone] Mondays & Thursdays: 7-9 AM.  First 15 patients are seen on a first come, first serve basis.    [place] Providers:  Organization  Address  Phone   Notes  [facility] [address],  [phone] Also accepts self-pay patients.  [facility] [address], [place]  [phone]   [place], [address], [place] [phone]   [facility] [address], [place] [phone]   Lucianne Lei [address], [place]   [phone] Only accepts [place] Access [place] patients after they have their name applied to their card.   Self-Pay (no insurance) in [facility]:  Organization         Address  Phone   Notes  Sickle Cell Patients, [facility] [place] [phone]   [facility] [place] [phone]   [facility] [place]  [place], [place], [place] [phone]   [facility]/Dr. Osei-Bonsu  [address],  [place] or [address], [place] [phone] Phone number for both [place] and [place] locations is the same.  Urgent Medical and [facility] [address] [place] [phone]   [facility] [address], [place] or [address] [phone]   [facility] [address], [place] [phone], phone; [phone], fax Sees patients 1st and 3rd Saturday of every month.  Must not qualify for public or private insurance (i.e. Medicaid, Medicare, [place] Health Choice, Veterans' Benefits)  Household income should be no more than 200% of the poverty level The clinic cannot treat you if you are pregnant or think you are pregnant  Sexually transmitted diseases are not treated at the clinic.    Dental Care: Organization         Address  Phone  Notes  [facility] Department of [facility] [place] [phone] Accepts children up to age 25 who are enrolled in [place] or [place]; pregnant women with a Medicaid card; and children who have applied for Medicaid or [place] Health Choice, but were declined, whose parents can pay a reduced fee at time of service.  [facility] Department of [facility] [place]  [address], [place] [phone] Accepts children up to age 58 who are enrolled in [place] or [place]; pregnant women with a Medicaid card; and children who have applied for Medicaid or [place] Health Choice, but were declined, whose parents can pay a reduced fee at time of service.  [place] Adult [facility] Access PRO[facility]  [place] [phone] Patients are seen by appointment only. Walk-ins are not accepted. [place] will see patients 63 years of age and older. Monday - Tuesday (8am-5pm) Most Wednesdays (8:30-5pm) $30 per visit, cash only  [facility] Adult [facility] Access PROGRAM  [address], [facility] [phone] Patients are seen by appointment only. Walk-ins are not accepted. [place] will see patients 46 years of age and older. One Wednesday Evening (Monthly: Volunteer Based).  $30 per visit, cash only  [place]  [phone] for adults; Children under age 39, call Graduate Pediatric Dentistry at [phone]. Children aged 58-14, please call [phone] to request a pediatric application.  Dental services are provided in all areas of dental care including  fillings, crowns and bridges, complete and partial dentures, implants, gum treatment, root canals, and extractions. Preventive care is also provided. Treatment is provided to both adults and children. Patients are selected via a lottery and there is often a waiting list.   [facility] [address], [place]  [phone] [email or web address]   [facility] [address] [place], [place] [phone], Ext. [phone] Second and Fourth Thursday of each month, opens at 6:30 AM; Clinic ends at 9 AM.  Patients are seen on a first-come first-served basis, and a limited number are seen during each clinic.   [facility]  [address] Hillard Danker [place], [place] [phone]   Eligibility Requirements You must have lived in [place], [place], or [place] counties for at least the last three months.   You cannot be eligible for state or federal sponsored [employer], including [employer], [place], or [employer].   You generally cannot be eligible for healthcare insurance through your employer.    How to apply: Eligibility screenings are held every Tuesday and Wednesday afternoon from 1:00 pm until 4:00 pm. You do not need an appointment for the interview!  [facility] [address] [place], [place]   [place]  [place] Department  [place]  [phone]    Behavioral Health Resources in the Community: Intensive Outpatient Programs Organization         Address  Phone  Notes  [place]. [address], [place], [place] [phone]   [facility] [address] [place], [place]   ADS: Alcohol & Drug Svcs [address], [place], [place]   [place] [address],  [place], [place] or [phone]   Substance Abuse Resources Organization         Address  Phone  Notes  Alcohol and Drug Services  [phone]   [place]  [phone]   [facility] [place]   Chinita Pester  [phone]   Residential & Outpatient Substance Abuse Program  [phone]   Psychological Services Organization         Address  Phone  Notes  [facility] [place]  [place]  [phone]   [place] [address] [place] or [phone]    Mobile Crisis Teams Organization         Address  Phone  Notes  [facility], Mobile Crisis Care Unit  [phone]   [facility]  [address] [place], [place]   [place] [address] [place] [phone]    Self-Help/Support Groups Organization         Address  Phone             Notes  [place]. of [place] - variety of support groups  [place] Call for more information  Narcotics Anonymous (NA), [facility] [address], [employer] [place]  2 meetings at this location   Special educational needs teacher         Address  Phone  Notes  [facility]idential [facility] [place],    [place]  [phone]   [facility]  [address] [place] [address], [place], [place]   [place], [place]  [phone] Admissions: 8am-3pm M-F  Incentives Substance [place] [address]    [place], [place] [phone]   [facility] [address] [place], [place], [place]   [facility] [address],  [place], [place]   [facility] Programs - Intensive Outpatient [address], [place], [place]   [facility] ([place].,  [place], [place] [phone] or [phone]   [facility] Services (RTS) [address], [place], [place] Accepts Medicaid  [facility] [place] [address]  [place] [phone] Substance Abuse/Addiction Treatment   [facility] Organization         Address  Phone  Notes  [facility]  [phone]   Domenic Schwab, PhD [address] Arlis Porta [place], [place]   [phone] or [phone]   [place], [place] [phone]   [facility] [address], [place], [place] [phone] Insurance/Medicaid/sponsorship through [facility] and Families [address], [address] [place]                                    [place], [place] [phone] [place] [address], [place] [phone]    Dr. Adele Schilder  [phone]   [facility] Dept. 1) [address], [place] 2) [place] 3)  [address], [place] [phone]  [phone]   [place] [phone] or [phone] (After Hours)

## 2013-10-31 NOTE — ED Provider Notes (Signed)
Medical screening examination/treatment/procedure(s) were performed by non-physician practitioner and as supervising physician I was immediately available for consultation/collaboration.  Ernestina Patches, MD 10/31/13 [phone]

## 2013-11-14 ENCOUNTER — Encounter (HOSPITAL_COMMUNITY): Payer: Self-pay | Admitting: Emergency Medicine

## 2014-01-19 ENCOUNTER — Inpatient Hospital Stay (HOSPITAL_COMMUNITY)
Admission: AD | Admit: 2014-01-19 | Discharge: 2014-01-19 | Disposition: A | Discharge status: Home / Self Care | Payer: Medicaid Other | Source: Ambulatory Visit | Attending: Obstetrics and Gynecology | Admitting: Obstetrics and Gynecology

## 2014-01-19 ENCOUNTER — Encounter (HOSPITAL_COMMUNITY): Payer: Self-pay | Admitting: *Deleted

## 2014-01-19 DIAGNOSIS — O210 Mild hyperemesis gravidarum: Secondary | ICD-10-CM | POA: Diagnosis present

## 2014-01-19 DIAGNOSIS — O99331 Smoking (tobacco) complicating pregnancy, first trimester: Secondary | ICD-10-CM | POA: Diagnosis not present

## 2014-01-19 DIAGNOSIS — O219 Vomiting of pregnancy, unspecified: Secondary | ICD-10-CM

## 2014-01-19 DIAGNOSIS — F17210 Nicotine dependence, cigarettes, uncomplicated: Secondary | ICD-10-CM | POA: Diagnosis not present

## 2014-01-19 DIAGNOSIS — Z3A01 Less than 8 weeks gestation of pregnancy: Secondary | ICD-10-CM | POA: Diagnosis not present

## 2014-01-19 LAB — URINALYSIS, ROUTINE W REFLEX MICROSCOPIC
Bilirubin Urine: NEGATIVE
GLUCOSE, UA: NEGATIVE mg/dL
Ketones, ur: NEGATIVE mg/dL
NITRITE: NEGATIVE
PH: 6.5 (ref 5.0–8.0)
Protein, ur: NEGATIVE mg/dL
SPECIFIC GRAVITY, URINE: 1.025 (ref 1.005–1.030)
Urobilinogen, UA: 0.2 mg/dL (ref 0.0–1.0)

## 2014-01-19 LAB — URINE MICROSCOPIC-ADD ON

## 2014-01-19 LAB — POCT PREGNANCY, URINE: PREG TEST UR: POSITIVE — AB

## 2014-01-19 MED ORDER — PROMETHAZINE HCL 25 MG PO TABS
25.0000 mg | ORAL_TABLET | Freq: Once | ORAL | Status: AC
  Administered 2014-01-19: 25 mg via ORAL
  Filled 2014-01-19: qty 1

## 2014-01-19 MED ORDER — PROMETHAZINE HCL 25 MG PO TABS: 25.0000 mg | ORAL_TABLET | Freq: Four times a day (QID) | ORAL | Status: DC | PRN

## 2014-01-19 NOTE — MAU Note (Signed)
+   [facility] C/O severe nausea, lack of appetite. No vomiting. States she also has gall stones, dx during last pregnancy. C/O slight RUQ pain. No lower abdominal pain. No bleeding or vaginal D/C.

## 2014-01-19 NOTE — Discharge Instructions (Signed)
You have been seen today for nausea and vomiting in pregnancy. Please see additional information for dietary recommendations in pregnancy. Take Phenergan as prescribed for nausea. You need to make an appointment with you OB of choice to establish care.   Eating Plan for Hyperemesis Gravidarum Severe cases of hyperemesis gravidarum can lead to dehydration and malnutrition. The hyperemesis eating plan is one way to lessen the symptoms of nausea and vomiting. It is often used with prescribed medicines to control your symptoms.  WHAT CAN I DO TO RELIEVE MY SYMPTOMS? Listen to your body. Everyone is different and has different preferences. Find what works best for you. Some of the following things may help:  Eat and drink slowly.  Eat 5-6 small meals daily instead of 3 large meals.   Eat crackers before you get out of bed in the morning.   Starchy foods are usually well tolerated (such as cereal, toast, bread, potatoes, pasta, rice, and pretzels).   Ginger may help with nausea. Add  tsp ground ginger to hot tea or choose ginger tea.   Try drinking 100% fruit juice or an electrolyte drink.  Continue to take your prenatal vitamins as directed by your health care provider. If you are having trouble taking your prenatal vitamins, talk with your health care provider about different options.  Include at least 1 serving of protein with your meals and snacks (such as meats or poultry, beans, nuts, eggs, or yogurt). Try eating a protein-rich snack before bed (such as cheese and crackers or a half [place] or peanut butter sandwich). WHAT THINGS SHOULD I AVOID TO REDUCE MY SYMPTOMS? The following things may help reduce your symptoms:  Avoid foods with strong smells. Try eating meals in well-ventilated areas that are free of odors.  Avoid drinking water or other beverages with meals. Try not to drink anything less than 30 minutes before and after meals.  Avoid drinking more than 1 cup of fluid at a  time.  Avoid fried or high-fat foods, such as butter and cream sauces.  Avoid spicy foods.  Avoid skipping meals the best you can. Nausea can be more intense on an empty stomach. If you cannot tolerate food at that time, do not force it. Try sucking on ice chips or other frozen items and make up the calories later.  Avoid lying down within 2 hours after eating. Document Released: 10/27/2006 Document Revised: 01/04/2013 Document Reviewed: 11/03/2012 [facility] Patient Information 2015 [place], [place]. This information is not intended to replace advice given to you by your health care provider. Make sure you discuss any questions you have with your health care provider.

## 2014-01-19 NOTE — MAU Provider Note (Signed)
History     CSN: [id number]  Arrival date and time: 01/19/14 0746   First Provider Initiated Contact with Patient 01/19/14 0809      Chief Complaint  Patient presents with  . Morning Sickness   HPI 34 y.o. G3P1011 [redacted]w[redacted]d by LMP presents with 2 weeks of nausea and 2-3 episodes of vomiting. Pt reports decreased appetite, denies weight loss, fever/chills, hematemesis. Pt denies abd pain, diarrhea, constipation, vaginal bleeding or discharge, dysuria, hematuria or frequency. Pt in monogamous relationship, denies concern for STDs.  [facility] Has not established OB care at this time. Plans to return to [facility] for [facility] care.   OB History    Gravida Para Term Preterm AB TAB SAB Ectopic Multiple Living   3 1 1  1  1   1       Past Medical History  Diagnosis Date  . LUMBAR RADICULOPATHY, RIGHT   . HYPERLIPIDEMIA   . ANXIETY   . DEPRESSION   . ADD   . COMMON MIGRAINE   . GERD   . CHOLELITHIASIS   . Pruritus ani   . Unspecified backache   . HYPERSOMNIA   . SMOKER   . 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 Allergic rhinitis, cause unspecified 05/08/2011  . ADD (attention deficit disorder)     Past Surgical History  Procedure Laterality Date  . Cesarean section    . Tonsillectomy    . Back surgery  11/09    Family History  Problem Relation Age of Onset  . Depression Mother   . Mental illness Mother   . Diabetes Father   . Hypertension Father   . Hyperlipidemia Father   . Diabetes Maternal Grandmother     Dialysis  . Hypertension Maternal Grandmother   . Heart disease Maternal Grandmother   . Cancer Paternal Grandmother     Breast Cancer  . Cancer Other     colon  . Kidney failure Other     dialysis  . Cancer Other     colon  . Diabetes Brother     History  Substance Use Topics  . Smoking status: Current Every Day Smoker -- 0.50 packs/day    Types: Cigarettes  . Smokeless tobacco: Never Used  .  Alcohol Use: No    Allergies:  Allergies  Allergen Reactions  . Percocet [Oxycodone-Acetaminophen] Itching    SEVERE ITCHING  . Tramadol     MIGRAINE    Prescriptions prior to admission  Medication Sig Dispense Refill Last Dose  . Prenatal Vit-Fe Fumarate-FA (PRENATAL MULTIVITAMIN) TABS tablet Take 1 tablet by mouth daily at 12 noon.   01/18/2014 at Unknown time  . sulfamethoxazole-trimethoprim (SEPTRA DS) 800-160 MG per tablet Take 1 tablet by mouth every 12 (twelve) hours. (Patient not taking: Reported on 01/19/2014) 20 tablet 0     Review of Systems  Constitutional: Negative for fever, chills, weight loss and malaise/fatigue.  Respiratory: Negative for shortness of breath.   Cardiovascular: Negative for chest pain.  Gastrointestinal: Positive for nausea and vomiting. Negative for heartburn, abdominal pain, diarrhea, constipation, blood in stool and melena.  Genitourinary: Negative for dysuria, urgency, frequency, hematuria and flank pain.  Musculoskeletal: Negative for back pain.   Physical Exam   Blood pressure 102/63, pulse 64, resp. rate 18, height 5\' 7"  (1.702 m), weight 228 lb (103.42 kg), last menstrual period 11/29/2013.  Physical Exam  Constitutional: She appears well-developed and well-nourished.  HENT:  Pink moist mucus  membranes, no conjunctiva pallor  Cardiovascular: Normal rate and normal heart sounds.   No murmur heard. Respiratory: Effort normal and breath sounds normal. No respiratory distress.  GI: Soft. Bowel sounds are normal. She exhibits no distension and no mass. There is no tenderness. There is no rebound and no guarding.  Neurological: She is alert.  Skin: Skin is warm and dry.  Psychiatric: She has a normal mood and affect. Her behavior is normal. Judgment and thought content normal.    MAU Course  Procedures Results for orders placed or performed during the hospital encounter of 01/19/14 (from the past 24 hour(s))  Urinalysis, Routine w reflex  microscopic     Status: Abnormal   Collection Time: 01/19/14  7:57 AM  Result Value Ref Range   Color, Urine YELLOW YELLOW   APPearance CLEAR CLEAR   Specific Gravity, Urine 1.025 1.005 - 1.030   pH 6.5 5.0 - 8.0   Glucose, UA NEGATIVE NEGATIVE mg/dL   Hgb urine dipstick TRACE (A) NEGATIVE   Bilirubin Urine NEGATIVE NEGATIVE   Ketones, ur NEGATIVE NEGATIVE mg/dL   Protein, ur NEGATIVE NEGATIVE mg/dL   Urobilinogen, UA 0.2 0.0 - 1.0 mg/dL   Nitrite NEGATIVE NEGATIVE   Leukocytes, UA TRACE (A) NEGATIVE  Urine microscopic-add on     Status: Abnormal   Collection Time: 01/19/14  7:57 AM  Result Value Ref Range   Squamous Epithelial / LPF RARE RARE   WBC, UA 3-6 <3 WBC/hpf   Bacteria, UA FEW (A) RARE  Pregnancy, urine POC     Status: Abnormal   Collection Time: 01/19/14  8:10 AM  Result Value Ref Range   Preg Test, Ur POSITIVE (A) NEGATIVE    MDM UA, UPT eval hydration and r/o infection. Abd benign, pt denies pain, not symptomatic of gallstones, acute infection. Pt denies bleeding/discharge or abd pain, no concern for ectopic at this time. PO phenergan and PO Challenge, tolerated food and fluids PO without difficulty. No episodes of emesis while in MAU. Patient reports improvement in symptoms.   Assessment and Plan  A: Nausea and vomiting in pregnancy prior to [redacted] weeks gestation  P: Discharge Home Rx: Phenergan 25mg  PO q8h PRN Nausea Educated on nausea and vomiting in preg F/U with MD Ulanda Edison at [facility] or OB of choice to establish care Return if experiences prolonged n/v, vaginal bleeding or discharge, abd pain.  Micker Samios, FNP-S  I have seen and evaluated the patient with the NP/PA/Med student. I agree with the assessment and plan as written above.   Luvenia Redden, PA-C  01/19/2014 9:42 AM

## 2014-02-10 ENCOUNTER — Encounter (HOSPITAL_COMMUNITY): Payer: Self-pay | Admitting: General Practice

## 2014-02-10 ENCOUNTER — Inpatient Hospital Stay (HOSPITAL_COMMUNITY)
Admission: AD | Admit: 2014-02-10 | Discharge: 2014-02-10 | Disposition: A | Discharge status: Home / Self Care | Payer: Medicaid Other | Source: Ambulatory Visit | Attending: Obstetrics and Gynecology | Admitting: Obstetrics and Gynecology

## 2014-02-10 DIAGNOSIS — Z3A10 10 weeks gestation of pregnancy: Secondary | ICD-10-CM | POA: Insufficient documentation

## 2014-02-10 DIAGNOSIS — R109 Unspecified abdominal pain: Secondary | ICD-10-CM | POA: Diagnosis present

## 2014-02-10 DIAGNOSIS — M7918 Myalgia, other site: Secondary | ICD-10-CM

## 2014-02-10 DIAGNOSIS — O210 Mild hyperemesis gravidarum: Secondary | ICD-10-CM | POA: Insufficient documentation

## 2014-02-10 DIAGNOSIS — K219 Gastro-esophageal reflux disease without esophagitis: Secondary | ICD-10-CM | POA: Insufficient documentation

## 2014-02-10 DIAGNOSIS — Z87891 Personal history of nicotine dependence: Secondary | ICD-10-CM | POA: Diagnosis not present

## 2014-02-10 DIAGNOSIS — O99611 Diseases of the digestive system complicating pregnancy, first trimester: Secondary | ICD-10-CM | POA: Diagnosis not present

## 2014-02-10 DIAGNOSIS — M791 Myalgia: Secondary | ICD-10-CM | POA: Insufficient documentation

## 2014-02-10 DIAGNOSIS — O219 Vomiting of pregnancy, unspecified: Secondary | ICD-10-CM

## 2014-02-10 LAB — URINALYSIS, ROUTINE W REFLEX MICROSCOPIC
Bilirubin Urine: NEGATIVE
GLUCOSE, UA: NEGATIVE mg/dL
Ketones, ur: NEGATIVE mg/dL
NITRITE: NEGATIVE
PROTEIN: NEGATIVE mg/dL
Specific Gravity, Urine: 1.025 (ref 1.005–1.030)
Urobilinogen, UA: 0.2 mg/dL (ref 0.0–1.0)
pH: 6 (ref 5.0–8.0)

## 2014-02-10 LAB — COMPREHENSIVE METABOLIC PANEL
ALBUMIN: 3.5 g/dL (ref 3.5–5.2)
ALK PHOS: 39 U/L (ref 39–117)
ALT: 12 U/L (ref 0–35)
AST: 14 U/L (ref 0–37)
Anion gap: 3 — ABNORMAL LOW (ref 5–15)
BUN: 7 mg/dL (ref 6–23)
CO2: 26 mmol/L (ref 19–32)
CREATININE: 0.44 mg/dL — AB (ref 0.50–1.10)
Calcium: 9 mg/dL (ref 8.4–10.5)
Chloride: 107 mmol/L (ref 96–112)
GFR calc Af Amer: >90 mL/min (ref >90)
GFR calc non Af Amer: >90 mL/min (ref >90)
Glucose, Bld: 91 mg/dL (ref 70–99)
Potassium: 3.9 mmol/L (ref 3.5–5.1)
Sodium: 136 mmol/L (ref 135–145)
Total Bilirubin: 0.5 mg/dL (ref 0.3–1.2)
Total Protein: 6.6 g/dL (ref 6.0–8.3)

## 2014-02-10 LAB — LIPASE, BLOOD: Lipase: 23 U/L (ref 11–59)

## 2014-02-10 LAB — URINE MICROSCOPIC-ADD ON

## 2014-02-10 LAB — CBC
HCT: 37.5 % (ref 36.0–46.0)
HEMOGLOBIN: 12.7 g/dL (ref 12.0–15.0)
MCH: 26.8 pg (ref 26.0–34.0)
MCHC: 33.9 g/dL (ref 30.0–36.0)
MCV: 79.3 fL (ref 78.0–100.0)
PLATELETS: 180 10*3/uL (ref 150–400)
RBC: 4.73 MIL/uL (ref 3.87–5.11)
RDW: 15.2 % (ref 11.5–15.5)
WBC: 7.2 10*3/uL (ref 4.0–10.5)

## 2014-02-10 LAB — AMYLASE: AMYLASE: 31 U/L (ref 0–105)

## 2014-02-10 MED ORDER — DOXYLAMINE-PYRIDOXINE 10-10 MG PO TBEC: DELAYED_RELEASE_TABLET | ORAL | Status: DC

## 2014-02-10 MED ORDER — METOCLOPRAMIDE HCL 10 MG PO TABS: 10.0000 mg | ORAL_TABLET | Freq: Four times a day (QID) | ORAL | Status: DC | PRN

## 2014-02-10 MED ORDER — METOCLOPRAMIDE HCL 10 MG PO TABS
10.0000 mg | ORAL_TABLET | Freq: Once | ORAL | Status: AC
  Administered 2014-02-10: 10 mg via ORAL
  Filled 2014-02-10: qty 1

## 2014-02-10 NOTE — Discharge Instructions (Signed)
Morning Sickness Morning sickness is when you feel sick to your stomach (nauseous) during pregnancy. This nauseous feeling may or may not come with vomiting. It often occurs in the morning but can be a problem any time of day. Morning sickness is most common during the first trimester, but it may continue throughout pregnancy. While morning sickness is unpleasant, it is usually harmless unless you develop severe and continual vomiting (hyperemesis gravidarum). This condition requires more intense treatment.  CAUSES  The cause of morning sickness is not completely known but seems to be related to normal hormonal changes that occur in pregnancy. RISK FACTORS You are at greater risk if you:  Experienced nausea or vomiting before your pregnancy.  Had morning sickness during a previous pregnancy.  Are pregnant with more than one baby, such as twins. TREATMENT  Do not use any medicines (prescription, over-the-counter, or herbal) for morning sickness without first talking to your health care provider. Your health care provider may prescribe or recommend:  Vitamin B6 supplements.  Anti-nausea medicines.  The herbal medicine ginger. HOME CARE INSTRUCTIONS   Only take over-the-counter or prescription medicines as directed by your health care provider.  Taking multivitamins before getting pregnant can prevent or decrease the severity of morning sickness in most women.  Eat a piece of dry toast or unsalted crackers before getting out of bed in the morning.  Eat five or six small meals a day.  Eat dry and bland foods (rice, baked potato). Foods high in carbohydrates are often helpful.  Do not drink liquids with your meals. Drink liquids between meals.  Avoid greasy, fatty, and spicy foods.  Get someone to cook for you if the smell of any food causes nausea and vomiting.  If you feel nauseous after taking prenatal vitamins, take the vitamins at night or with a snack.  Snack on protein  foods (nuts, yogurt, cheese) between meals if you are hungry.  Eat unsweetened gelatins for desserts.  Wearing an acupressure wristband (worn for sea sickness) may be helpful.  Acupuncture may be helpful.  Do not smoke.  Get a humidifier to keep the air in your house free of odors.  Get plenty of fresh air. SEEK MEDICAL CARE IF:   Your home remedies are not working, and you need medicine.  You feel dizzy or lightheaded.  You are losing weight. SEEK IMMEDIATE MEDICAL CARE IF:   You have persistent and uncontrolled nausea and vomiting.  You pass out (faint). MAKE SURE YOU:  Understand these instructions.  Will watch your condition.  Will get help right away if you are not doing well or get worse. Document Released: 02/20/2006 Document Revised: 01/04/2013 Document Reviewed: 06/16/2012 [facility] Patient Information 2015 [place], [place]. This information is not intended to replace advice given to you by your health care provider. Make sure you discuss any questions you have with your health care provider.  Musculoskeletal Pain Musculoskeletal pain is muscle and boney aches and pains. These pains can occur in any part of the body. Your caregiver may treat you without knowing the cause of the pain. They may treat you if blood or urine tests, X-rays, and other tests were normal.  CAUSES There is often not a definite cause or reason for these pains. These pains may be caused by a type of germ (virus). The discomfort may also come from overuse. Overuse includes working out too hard when your body is not fit. Boney aches also come from weather changes. Bone is sensitive to atmospheric  pressure changes. HOME CARE INSTRUCTIONS   Ask when your test results will be ready. Make sure you get your test results.  Only take over-the-counter or prescription medicines for pain, discomfort, or fever as directed by your caregiver. If you were given medications for your condition, do not drive,  operate machinery or power tools, or sign legal documents for 24 hours. Do not drink alcohol. Do not take sleeping pills or other medications that may interfere with treatment.  Continue all activities unless the activities cause more pain. When the pain lessens, slowly resume normal activities. Gradually increase the intensity and duration of the activities or exercise.  During periods of severe pain, bed rest may be helpful. Lay or sit in any position that is comfortable.  Putting ice on the injured area.  Put ice in a bag.  Place a towel between your skin and the bag.  Leave the ice on for 15 to 20 minutes, 3 to 4 times a day.  Follow up with your caregiver for continued problems and no reason can be found for the pain. If the pain becomes worse or does not go away, it may be necessary to repeat tests or do additional testing. Your caregiver may need to look further for a possible cause. SEEK IMMEDIATE MEDICAL CARE IF:  You have pain that is getting worse and is not relieved by medications.  You develop chest pain that is associated with shortness or breath, sweating, feeling sick to your stomach (nauseous), or throw up (vomit).  Your pain becomes localized to the abdomen.  You develop any new symptoms that seem different or that concern you. MAKE SURE YOU:   Understand these instructions.  Will watch your condition.  Will get help right away if you are not doing well or get worse. Document Released: 12/30/2004 Document Revised: 03/24/2011 Document Reviewed: 09/03/2012 [facility] Patient Information 2015 [place], [place]. This information is not intended to replace advice given to you by your health care provider. Make sure you discuss any questions you have with your health care provider.

## 2014-02-10 NOTE — MAU Provider Note (Signed)
Chief Complaint: Morning Sickness and Abdominal Pain   First Provider Initiated Contact with Patient 02/10/14 0935     SUBJECTIVE HPI: Tina [name] is a 34 y.o. G3P1011 at [redacted]w[redacted]d by LMP who presents to maternity admissions reporting daily nausea with occasional vomiting and right flank, upper abdominal pain that is mild but constant.  The nausea started several weeks ago and she was seen in MAU at 7 weeks and received Phenergan Rx. This medication is no longer helping.  She has pain in her right upper side/right rib area with movement.  She denies recent change in activity or injury.  The pain is not associated with eating. She does report a hx of gallstones not requiring medical treatment.  She denies lower abdominal or back pain, LOF, vaginal bleeding, vaginal itching/burning, urinary symptoms, h/a, dizziness, n/v, or fever/chills.     Past Medical History  Diagnosis Date  . LUMBAR RADICULOPATHY, RIGHT   . HYPERLIPIDEMIA   . ANXIETY   . DEPRESSION   . ADD   . COMMON MIGRAINE   . GERD   . CHOLELITHIASIS   . Pruritus ani   . Unspecified backache   . HYPERSOMNIA   . SMOKER   . 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 Allergic rhinitis, cause unspecified 05/08/2011  . ADD (attention deficit disorder)    Past Surgical History  Procedure Laterality Date  . Cesarean section    . Tonsillectomy    . Back surgery  11/09   History   Social History  . Marital Status: Single    Spouse Name: N/A    Number of Children: 1  . Years of Education: N/A   Occupational History  . Building surveyor  . part time student [employer]    Social History Main Topics  . Smoking status: Former Smoker -- 0.50 packs/day    Types: Cigarettes    Quit date: 01/09/2014  . Smokeless tobacco: Never Used  . Alcohol Use: No  . Drug Use: No  . Sexual Activity: Not Currently    Birth Control/ Protection: Condom   Other Topics Concern  . Not on file   Social History Narrative   Also raises  41 yo nephew   No current facility-administered medications on file prior to encounter.   Current Outpatient Prescriptions on File Prior to Encounter  Medication Sig Dispense Refill  . Prenatal Vit-Fe Fumarate-FA (PRENATAL MULTIVITAMIN) TABS tablet Take 1 tablet by mouth daily at 12 noon.    . promethazine (PHENERGAN) 25 MG tablet Take 1 tablet (25 mg total) by mouth every 6 (six) hours as needed for nausea or vomiting. 30 tablet 0   Allergies  Allergen Reactions  . Percocet [Oxycodone-Acetaminophen] Itching    SEVERE ITCHING  . Tramadol     MIGRAINE    ROS: Pertinent items in HPI  OBJECTIVE Blood pressure 124/80, pulse 98, temperature 98 F (36.7 C), temperature source Oral, resp. rate 18, height 5\' 6"  (1.676 m), weight 107.049 kg (236 lb), last menstrual period 11/29/2013. GENERAL: Well-developed, well-nourished female in no acute distress.  HEENT: Normocephalic HEART: normal rate RESP: normal effort ABDOMEN: Soft, non-tender, no epigastric tenderness, no RLQ tenderness, no rebound or guarding. Tenderness to palpation of far right upper abdomen into right upper side.  Tenderness to area with movement of right arm.  Musculoskeletal:  Negative CVA tenderness EXTREMITIES: Nontender, no edema NEURO: Alert and oriented   LAB RESULTS Results for orders placed or performed during the hospital encounter of 02/10/14 (from the  past 24 hour(s))  Urinalysis, Routine w reflex microscopic     Status: Abnormal   Collection Time: 02/10/14  9:10 AM  Result Value Ref Range   Color, Urine YELLOW YELLOW   APPearance CLOUDY (A) CLEAR   Specific Gravity, Urine 1.025 1.005 - 1.030   pH 6.0 5.0 - 8.0   Glucose, UA NEGATIVE NEGATIVE mg/dL   Hgb urine dipstick TRACE (A) NEGATIVE   Bilirubin Urine NEGATIVE NEGATIVE   Ketones, ur NEGATIVE NEGATIVE mg/dL   Protein, ur NEGATIVE NEGATIVE mg/dL   Urobilinogen, UA 0.2 0.0 - 1.0 mg/dL   Nitrite NEGATIVE NEGATIVE   Leukocytes, UA TRACE (A) NEGATIVE   Urine microscopic-add on     Status: Abnormal   Collection Time: 02/10/14  9:10 AM  Result Value Ref Range   Squamous Epithelial / LPF MANY (A) RARE   WBC, UA 0-2 <3 WBC/hpf   Bacteria, UA FEW (A) RARE  CBC     Status: None   Collection Time: 02/10/14 10:07 AM  Result Value Ref Range   WBC 7.2 4.0 - 10.5 K/uL   RBC 4.73 3.87 - 5.11 MIL/uL   Hemoglobin 12.7 12.0 - 15.0 g/dL   HCT 37.5 36.0 - 46.0 %   MCV 79.3 78.0 - 100.0 fL   MCH 26.8 26.0 - 34.0 pg   MCHC 33.9 30.0 - 36.0 g/dL   RDW 15.2 11.5 - 15.5 %   Platelets 180 150 - 400 K/uL  Comprehensive metabolic panel     Status: Abnormal   Collection Time: 02/10/14 10:07 AM  Result Value Ref Range   Sodium 136 135 - 145 mmol/L   Potassium 3.9 3.5 - 5.1 mmol/L   Chloride 107 96 - 112 mmol/L   CO2 26 19 - 32 mmol/L   Glucose, Bld 91 70 - 99 mg/dL   BUN 7 6 - 23 mg/dL   Creatinine, Ser 0.44 (L) 0.50 - 1.10 mg/dL   Calcium 9.0 8.4 - 10.5 mg/dL   Total Protein 6.6 6.0 - 8.3 g/dL   Albumin 3.5 3.5 - 5.2 g/dL   AST 14 0 - 37 U/L   ALT 12 0 - 35 U/L   Alkaline Phosphatase 39 39 - 117 U/L   Total Bilirubin 0.5 0.3 - 1.2 mg/dL   GFR calc non Af Amer >90 >90 mL/min   GFR calc Af Amer >90 >90 mL/min   Anion gap 3 (L) 5 - 15   Amylase and Lipase results pending  ASSESSMENT 1. Nausea and vomiting during pregnancy prior to [redacted] weeks gestation   2. Musculoskeletal pain     PLAN Discharge home Reglan 10 mg Q 6 hours PRN Diclegis Rx sent to pharmacy, prior authorization for [facility] Medicaid faxed Rest/ice/heat/Tylenol for MSK pain F/U as scheduled with [facility]   Follow-up Information    Follow up with Logan Bores, MD.   Specialty:  Obstetrics and Gynecology   Why:  As scheduled   Contact information:   [address] [address] [phone]       Follow up with [place].   Why:  As needed for emergencies   Contact information:   [address] [phone] [place] Certified Nurse-Midwife 02/10/2014  11:12 AM

## 2014-02-10 NOTE — MAU Note (Signed)
Ongoing nausea. Was given phenergan, not really helping.  Pain rt upper side near ribcage- started a few days ago, initially off and on , now constant.

## 2014-02-24 LAB — OB RESULTS CONSOLE GC/CHLAMYDIA
CHLAMYDIA, DNA PROBE: NEGATIVE
Gonorrhea: NEGATIVE

## 2014-02-24 LAB — OB RESULTS CONSOLE ANTIBODY SCREEN: ANTIBODY SCREEN: NEGATIVE

## 2014-02-24 LAB — OB RESULTS CONSOLE ABO/RH: RH TYPE: POSITIVE

## 2014-02-24 LAB — OB RESULTS CONSOLE HIV ANTIBODY (ROUTINE TESTING): HIV: NONREACTIVE

## 2014-02-24 LAB — OB RESULTS CONSOLE HEPATITIS B SURFACE ANTIGEN: HEP B S AG: NEGATIVE

## 2014-02-24 LAB — OB RESULTS CONSOLE RPR: RPR: NONREACTIVE

## 2014-04-01 ENCOUNTER — Encounter (HOSPITAL_COMMUNITY): Payer: Self-pay | Admitting: General Practice

## 2014-04-01 ENCOUNTER — Inpatient Hospital Stay (HOSPITAL_COMMUNITY)
Admission: AD | Admit: 2014-04-01 | Discharge: 2014-04-01 | Disposition: A | Discharge status: Home / Self Care | Payer: Medicaid Other | Source: Ambulatory Visit | Attending: Obstetrics and Gynecology | Admitting: Obstetrics and Gynecology

## 2014-04-01 DIAGNOSIS — K5900 Constipation, unspecified: Secondary | ICD-10-CM | POA: Diagnosis not present

## 2014-04-01 DIAGNOSIS — Z3A17 17 weeks gestation of pregnancy: Secondary | ICD-10-CM | POA: Insufficient documentation

## 2014-04-01 DIAGNOSIS — Z87891 Personal history of nicotine dependence: Secondary | ICD-10-CM | POA: Diagnosis not present

## 2014-04-01 DIAGNOSIS — O1202 Gestational edema, second trimester: Secondary | ICD-10-CM | POA: Insufficient documentation

## 2014-04-01 LAB — URINE MICROSCOPIC-ADD ON

## 2014-04-01 LAB — URINALYSIS, ROUTINE W REFLEX MICROSCOPIC
Bilirubin Urine: NEGATIVE
Glucose, UA: NEGATIVE mg/dL
Ketones, ur: NEGATIVE mg/dL
Leukocytes, UA: NEGATIVE
NITRITE: NEGATIVE
PH: 6 (ref 5.0–8.0)
PROTEIN: NEGATIVE mg/dL
Specific Gravity, Urine: 1.015 (ref 1.005–1.030)
UROBILINOGEN UA: 0.2 mg/dL (ref 0.0–1.0)

## 2014-04-01 NOTE — Discharge Instructions (Signed)
Peripheral Edema °You have swelling in your legs (peripheral edema). This swelling is due to excess accumulation of salt and water in your body. Edema may be a sign of heart, kidney or liver disease, or a side effect of a medication. It may also be due to problems in the leg veins. Elevating your legs and using special support stockings may be very helpful, if the cause of the swelling is due to poor venous circulation. Avoid long periods of standing, whatever the cause. °Treatment of edema depends on identifying the cause. Chips, pretzels, pickles and other salty foods should be avoided. Restricting salt in your diet is almost always needed. Water pills (diuretics) are often used to remove the excess salt and water from your body via urine. These medicines prevent the kidney from reabsorbing sodium. This increases urine flow. °Diuretic treatment may also result in lowering of potassium levels in your body. Potassium supplements may be needed if you have to use diuretics daily. Daily weights can help you keep track of your progress in clearing your edema. You should call your caregiver for follow up care as recommended. °SEEK IMMEDIATE MEDICAL CARE IF:  °· You have increased swelling, pain, redness, or heat in your legs. °· You develop shortness of breath, especially when lying down. °· You develop chest or abdominal pain, weakness, or fainting. °· You have a fever. °Document Released: 02/07/2004 Document Revised: 03/24/2011 Document Reviewed: 01/17/2009 °[employer]® Patient Information ©2015 [employer]. This information is not intended to replace advice given to you by your health care provider. Make sure you discuss any questions you have with your health care provider. ° °

## 2014-04-01 NOTE — MAU Note (Signed)
Pt presents to MAU with complaints of lower abdominal cramping that started last night around 5pm. Reports swelling in her legs and feet for a couple of weeks.

## 2014-04-01 NOTE — MAU Provider Note (Signed)
History     CSN: [id number]  Arrival date and time: 04/01/14 1722   None     Chief Complaint  Patient presents with  . Abdominal Cramping  . Leg Swelling   HPI    [name] is a 34 y.o. female G3P1011 at [redacted]w[redacted]d who presents with increased swelling that has been there for 3 weeks.  She had lower abdominal pain that she felt last night before bed. The pain was in the middle of her stomach and it felt like cramping; the cramping has stopped.  Denies vaginal bleeding.  She denies pain currently.   She is drinking one bottle of water per day and some days she is not drinking any.  Her significant other is present with her and is very concerned about her diet; she eats large amounts of fried food and fast food and does not drink water.   OB History    Gravida Para Term Preterm AB TAB SAB Ectopic Multiple Living   3 1 1  1  1   1       Past Medical History  Diagnosis Date  . LUMBAR RADICULOPATHY, RIGHT   . HYPERLIPIDEMIA   . ANXIETY   . DEPRESSION   . ADD   . COMMON MIGRAINE   . GERD   . CHOLELITHIASIS   . Pruritus ani   . Unspecified backache   . HYPERSOMNIA   . SMOKER   . 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 Allergic rhinitis, cause unspecified 05/08/2011  . ADD (attention deficit disorder)     Past Surgical History  Procedure Laterality Date  . Cesarean section    . Tonsillectomy    . Back surgery  11/09    Family History  Problem Relation Age of Onset  . Depression Mother   . Mental illness Mother   . Diabetes Father   . Hypertension Father   . Hyperlipidemia Father   . Diabetes Maternal Grandmother     Dialysis  . Hypertension Maternal Grandmother   . Heart disease Maternal Grandmother   . Cancer Paternal Grandmother     Breast Cancer  . Cancer Other     colon  . Kidney failure Other     dialysis  . Cancer Other     colon  . Diabetes Brother     History  Substance Use Topics  . Smoking status: Former Smoker --  0.50 packs/day    Types: Cigarettes    Quit date: 01/09/2014  . Smokeless tobacco: Never Used  . Alcohol Use: No    Allergies:  Allergies  Allergen Reactions  . Percocet [Oxycodone-Acetaminophen] Itching    SEVERE ITCHING  . Tramadol     MIGRAINE    Prescriptions prior to admission  Medication Sig Dispense Refill Last Dose  . acetaminophen (TYLENOL) 500 MG tablet Take 1,000 mg by mouth every 6 (six) hours as needed for mild pain.   04/01/2014 at Unknown time  . Doxylamine-Pyridoxine (DICLEGIS) 10-10 MG TBEC Take 2 tabs at bedtime.  If needed, take 1 tab in the morning and another tab in the afternoon.  You may take 4 tabs/day. 100 tablet 5 04/01/2014 at Unknown time  . metoCLOPramide (REGLAN) 10 MG tablet Take 1 tablet (10 mg total) by mouth every 6 (six) hours as needed for nausea. 90 tablet 3 04/01/2014 at Unknown time  . Prenatal Vit-Fe Fumarate-FA (PRENATAL MULTIVITAMIN) TABS tablet Take 1 tablet by mouth daily at 12 noon.   04/01/2014 at Unknown  time  . promethazine (PHENERGAN) 25 MG tablet Take 1 tablet (25 mg total) by mouth every 6 (six) hours as needed for nausea or vomiting. (Patient not taking: Reported on 04/01/2014) 30 tablet 0 02/09/2014 at Unknown time    Results for orders placed or performed during the hospital encounter of 04/01/14 (from the past 48 hour(s))  Urinalysis, Routine w reflex microscopic     Status: Abnormal   Collection Time: 04/01/14  5:50 PM  Result Value Ref Range   Color, Urine YELLOW YELLOW   APPearance CLEAR CLEAR   Specific Gravity, Urine 1.015 1.005 - 1.030   pH 6.0 5.0 - 8.0   Glucose, UA NEGATIVE NEGATIVE mg/dL   Hgb urine dipstick TRACE (A) NEGATIVE   Bilirubin Urine NEGATIVE NEGATIVE   Ketones, ur NEGATIVE NEGATIVE mg/dL   Protein, ur NEGATIVE NEGATIVE mg/dL   Urobilinogen, UA 0.2 0.0 - 1.0 mg/dL   Nitrite NEGATIVE NEGATIVE   Leukocytes, UA NEGATIVE NEGATIVE  Urine microscopic-add on     Status: Abnormal   Collection Time: 04/01/14  5:50  PM  Result Value Ref Range   Squamous Epithelial / LPF FEW (A) RARE   WBC, UA 0-2 <3 WBC/hpf   RBC / HPF 0-2 <3 RBC/hpf   Bacteria, UA RARE RARE    Review of Systems  Constitutional: Negative for fever and chills.  Cardiovascular: Positive for leg swelling.  Gastrointestinal: Positive for constipation (BM today however it had been 4-5 days. ). Negative for nausea, vomiting, abdominal pain and diarrhea.  Genitourinary: Negative for dysuria, urgency, frequency and hematuria.  Neurological: Negative for dizziness and headaches.   Physical Exam   Blood pressure 125/72, pulse 101, temperature 98.5 F (36.9 C), temperature source Oral, resp. rate 18, height 5\' 6"  (1.676 m), weight 120.203 kg (265 lb), last menstrual period 11/29/2013.  Physical Exam  Constitutional: She is oriented to person, place, and time. She appears well-developed and well-nourished. No distress.  HENT:  Head: Normocephalic.  Eyes: Pupils are equal, round, and reactive to light.  Neck: Neck supple.  Respiratory: Effort normal.  GI: Soft.  Genitourinary:  Dilation: Closed Effacement (%): Thick Cervical Position: Posterior Exam by:: jennifer Rasch, NP  Musculoskeletal: Normal range of motion.       Right ankle: She exhibits swelling (Non- Swelling ).       Left ankle: She exhibits swelling (Non-Swelling ).  Neurological: She is alert and oriented to person, place, and time.  Skin: Skin is warm. She is not diaphoretic.  Psychiatric: Her behavior is normal.    MAU Course  Procedures  None  MDM + fetal heart tones.  UA   Discussed patient with Dr. Marvel Plan   Assessment and Plan   A:  1. Leg swelling in pregnancy, second trimester   2.      Constipation in pregnancy     P:  Discharge home in stable condition Ted hose recommended Decreased fried foods  Increase water intake 60-80 ounces per day Increase activity while at work; avoid sitting for long periods of time Stool softeners over the  counter, as directed on the bottle.   Lezlie Lye, NP 04/01/2014 8:02 PM

## 2014-07-05 ENCOUNTER — Encounter: Payer: Medicaid Other | Attending: Internal Medicine

## 2014-07-05 VITALS — Ht 67.0 in | Wt 269.2 lb

## 2014-07-05 DIAGNOSIS — Z713 Dietary counseling and surveillance: Secondary | ICD-10-CM | POA: Diagnosis not present

## 2014-07-05 DIAGNOSIS — O24410 Gestational diabetes mellitus in pregnancy, diet controlled: Secondary | ICD-10-CM | POA: Diagnosis not present

## 2014-07-06 NOTE — Progress Notes (Signed)
  Patient was seen on 07/06/14 for Gestational Diabetes self-management class at the [facility]. The following learning objectives were met by the patient during this course:   States the definition of Gestational Diabetes  States why dietary management is important in controlling blood glucose  Describes the effects each nutrient has on blood glucose levels  Demonstrates ability to create a balanced meal plan  Demonstrates carbohydrate counting   States when to check blood glucose levels  Demonstrates proper blood glucose monitoring techniques  States the effect of stress and exercise on blood glucose levels  States the importance of limiting caffeine and abstaining from alcohol and smoking  Blood glucose monitor given: Accu Chek Nano BG Monitoring Kit Lot # [id number] Exp: 06/13/15 Blood glucose reading: 93 mg/dl  Patient instructed to monitor glucose levels: FBS: 60 - <90 1 hour: <140  *Patient received handouts:  Nutrition Diabetes and Pregnancy  Carbohydrate Counting List  Patient will be seen for follow-up as needed.

## 2014-07-10 ENCOUNTER — Other Ambulatory Visit: Payer: Self-pay

## 2014-08-26 NOTE — Anesthesia Preprocedure Evaluation (Addendum)
Anesthesia Evaluation  Patient identified by MRN, date of birth, ID band Patient awake    Reviewed: Allergy & Precautions, NPO status , Patient's Chart, lab work & pertinent test results, reviewed documented beta blocker date and time   Airway Mallampati: II   Neck ROM: Full    Dental  (+) Dental Advisory Given, Teeth Intact   Pulmonary former smoker (quit 12/15),  breath sounds clear to auscultation        Cardiovascular negative cardio ROS  Rhythm:Regular     Neuro/Psych Anxiety Depression    GI/Hepatic GERD-  Medicated,  Endo/Other  diabetes, GestationalMorbid obesity  Renal/GU      Musculoskeletal   Abdominal (+) + obese,   Peds  Hematology 11/35  plts 162 08/28/2014   Anesthesia Other Findings   Reproductive/Obstetrics                           Anesthesia Physical Anesthesia Plan  ASA: II  Anesthesia Plan: Spinal   Post-op Pain Management:    Induction:   Airway Management Planned:   Additional Equipment:   Intra-op Plan:   Post-operative Plan:   Informed Consent: I have reviewed the patients History and Physical, chart, labs and discussed the procedure including the risks, benefits and alternatives for the proposed anesthesia with the patient or authorized representative who has indicated his/her understanding and acceptance.     Plan Discussed with:   Anesthesia Plan Comments: (Check am labs)        Anesthesia Quick Evaluation

## 2014-08-28 ENCOUNTER — Encounter (HOSPITAL_COMMUNITY): Payer: Self-pay

## 2014-08-28 ENCOUNTER — Encounter (HOSPITAL_COMMUNITY)
Admission: RE | Admit: 2014-08-28 | Discharge: 2014-08-28 | Disposition: A | Discharge status: Home / Self Care | Payer: Medicaid Other | Source: Ambulatory Visit | Attending: Obstetrics and Gynecology | Admitting: Obstetrics and Gynecology

## 2014-08-28 HISTORY — DX: Other specified postprocedural states: Z98.890

## 2014-08-28 HISTORY — DX: Nausea with vomiting, unspecified: R11.2

## 2014-08-28 LAB — COMPREHENSIVE METABOLIC PANEL
ALK PHOS: 149 U/L — AB (ref 38–126)
ALT: 11 U/L — ABNORMAL LOW (ref 14–54)
ANION GAP: 11 (ref 5–15)
AST: 17 U/L (ref 15–41)
Albumin: 2.5 g/dL — ABNORMAL LOW (ref 3.5–5.0)
BUN: 5 mg/dL — ABNORMAL LOW (ref 6–20)
CALCIUM: 8.6 mg/dL — AB (ref 8.9–10.3)
CHLORIDE: 102 mmol/L (ref 101–111)
CO2: 24 mmol/L (ref 22–32)
Creatinine, Ser: 0.59 mg/dL (ref 0.44–1.00)
GFR calc non Af Amer: >60 mL/min (ref >60)
Glucose, Bld: 141 mg/dL — ABNORMAL HIGH (ref 65–99)
Potassium: 2.7 mmol/L — CL (ref 3.5–5.1)
SODIUM: 137 mmol/L (ref 135–145)
Total Bilirubin: 0.3 mg/dL (ref 0.3–1.2)
Total Protein: 6 g/dL — ABNORMAL LOW (ref 6.5–8.1)

## 2014-08-28 LAB — CBC
HCT: 35.2 % — ABNORMAL LOW (ref 36.0–46.0)
Hemoglobin: 11.6 g/dL — ABNORMAL LOW (ref 12.0–15.0)
MCH: 27 pg (ref 26.0–34.0)
MCHC: 33 g/dL (ref 30.0–36.0)
MCV: 82.1 fL (ref 78.0–100.0)
PLATELETS: 162 10*3/uL (ref 150–400)
RBC: 4.29 MIL/uL (ref 3.87–5.11)
RDW: 16.4 % — ABNORMAL HIGH (ref 11.5–15.5)
WBC: 7.1 10*3/uL (ref 4.0–10.5)

## 2014-08-28 LAB — ABO/RH: ABO/RH(D): O POS

## 2014-08-28 MED ORDER — DEXTROSE 5 % IV SOLN
3.0000 g | INTRAVENOUS | Status: AC
  Administered 2014-08-29: 3 g via INTRAVENOUS
  Filled 2014-08-28: qty 3000

## 2014-08-28 NOTE — Pre-Procedure Instructions (Signed)
Dr. Jillyn Hidden notified about pt's K+ result of 2.7. He said to notify Dr. Willis Modena and let him prescribe treatment. I notified Roselyn Reef in Dr. Paulene Floor office.

## 2014-08-28 NOTE — Patient Instructions (Signed)
Your procedure is scheduled on:08/29/14  Enter through the Main Entrance at :6am Pick up desk phone and dial [phone] and inform us of your arrival.  Please call [phone] if you have any problems the morning of surgery.  Remember: Do not eat food or drink liquids after midnight:tonight Water ok until:3:30 am Tuesday   You may brush your teeth the morning of surgery.  Take these meds the morning of surgery with a sip of water:Prilosec and or Reglan  DO NOT wear jewelry, eye make-up, lipstick,body lotion, or dark fingernail polish.  (Polished toes are ok) You may wear deodorant.  If you are to be admitted after surgery, leave suitcase in car until your room has been assigned. Patients discharged on the day of surgery will not be allowed to drive home. Wear loose fitting, comfortable clothes for your ride home.

## 2014-08-28 NOTE — H&P (Signed)
Tina [name] is a 34 y.o. female, G3 P1011, EGA [redacted] weeks with EDC 8-23 presenting for repeat c-section and BTL. Previous LTCS, declines VBAC.  Pregnancy complicated by J9ERD controlled with Glyburide in the am, reactive NSTs. Also desires permanent sterility.  Maternal Medical History:  Fetal activity: Perceived fetal activity is normal.    Prenatal Complications - Diabetes: gestational. Diabetes is managed by oral agent (monotherapy).      OB History    Gravida Para Term Preterm AB TAB SAB Ectopic Multiple Living   3 1 1  1  1   1      Past Medical History  Diagnosis Date  . LUMBAR RADICULOPATHY, RIGHT   . HYPERLIPIDEMIA   . ANXIETY   . DEPRESSION   . ADD   . COMMON MIGRAINE   . GERD   . CHOLELITHIASIS   . Pruritus ani   . Unspecified backache   . HYPERSOMNIA   . SMOKER   . 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 Allergic rhinitis, cause unspecified 05/08/2011  . ADD (attention deficit disorder)   . GDM (gestational diabetes mellitus)   . PONV (postoperative nausea and vomiting)     peri op vomiting   Past Surgical History  Procedure Laterality Date  . Cesarean section      x 1  . Tonsillectomy    . Back surgery  11/09   Family History: family history includes Cancer in her other, other, and paternal grandmother; Depression in her mother; Diabetes in her brother, father, and maternal grandmother; Heart disease in her maternal grandmother; Hyperlipidemia in her father; Hypertension in her father and maternal grandmother; Kidney failure in her other; Mental illness in her mother. Social History:  reports that she quit smoking about 7 months ago. Her smoking use included Cigarettes. She smoked 0.50 packs per day. She has never used smokeless tobacco. She reports that she does not drink alcohol or use illicit drugs.   Prenatal Transfer Tool  Maternal Diabetes: Yes:  Diabetes Type:  Insulin/Medication controlled Genetic Screening: Normal Maternal  Ultrasounds/Referrals: Normal Fetal Ultrasounds or other Referrals:  None Maternal Substance Abuse:  No Significant Maternal Medications:  Meds include: Other:  Significant Maternal Lab Results:  Lab values include: Group B Strep positive Other Comments:  Panorama low risk, GDM controlled with glyburide  Review of Systems  Respiratory: Negative.   Cardiovascular: Negative.       Last menstrual period 11/29/2013. Maternal Exam:  Abdomen: Patient reports no abdominal tenderness. Surgical scars: low transverse.   Estimated fetal weight is 8 lbs.   Fetal presentation: vertex  Introitus: Normal vulva. Normal vagina.  Amniotic fluid character: not assessed.     Physical Exam  Constitutional: She appears well-developed and well-nourished.  Neck: Neck supple. No thyromegaly present.  Cardiovascular: Normal rate, regular rhythm and normal heart sounds.   No murmur heard. Respiratory: Effort normal and breath sounds normal. No respiratory distress.  GI: Soft.    Prenatal labs: ABO, Rh: --/--/O POS, O POS (08/15 0915) Antibody: NEG (08/15 0915) Rubella:  Immune RPR: Nonreactive (02/12 0000)  HBsAg: Negative (02/12 0000)  HIV: Non-reactive (02/12 0000)  GBS:   Pos  Assessment/Plan: IUP at 39 weeks, previous c-section, A2 GDM, GBS+, being admitted for repeat c-section and BTL.  Procedure and risks, permanency and failure rate of BTL have all been discussed.  Also noted to have hypokalemia on pre-op labs, started on PO K+ day prior to surgery and will continue to follow and replace  post-op.   Farhaan Mabee D 08/28/2014, 7:33 PM

## 2014-08-29 ENCOUNTER — Encounter (HOSPITAL_COMMUNITY): Payer: Self-pay | Admitting: *Deleted

## 2014-08-29 ENCOUNTER — Inpatient Hospital Stay (HOSPITAL_COMMUNITY)
Admission: RE | Admit: 2014-08-29 | Discharge: 2014-09-01 | DRG: 765 | Disposition: A | Discharge status: Home / Self Care | Payer: Medicaid Other | Source: Ambulatory Visit | Attending: Obstetrics and Gynecology | Admitting: Obstetrics and Gynecology

## 2014-08-29 ENCOUNTER — Inpatient Hospital Stay (HOSPITAL_COMMUNITY): Payer: Medicaid Other | Admitting: Anesthesiology

## 2014-08-29 ENCOUNTER — Inpatient Hospital Stay (HOSPITAL_COMMUNITY): Payer: Medicaid Other

## 2014-08-29 ENCOUNTER — Encounter (HOSPITAL_COMMUNITY)
Admission: RE | Disposition: A | Discharge status: Home / Self Care | Payer: Self-pay | Source: Ambulatory Visit | Attending: Obstetrics and Gynecology

## 2014-08-29 ENCOUNTER — Inpatient Hospital Stay (HOSPITAL_COMMUNITY): Admission: AD | Admit: 2014-08-29 | Payer: Self-pay | Source: Ambulatory Visit | Admitting: Obstetrics and Gynecology

## 2014-08-29 DIAGNOSIS — O9081 Anemia of the puerperium: Secondary | ICD-10-CM | POA: Diagnosis not present

## 2014-08-29 DIAGNOSIS — Z87891 Personal history of nicotine dependence: Secondary | ICD-10-CM | POA: Diagnosis not present

## 2014-08-29 DIAGNOSIS — K219 Gastro-esophageal reflux disease without esophagitis: Secondary | ICD-10-CM | POA: Diagnosis present

## 2014-08-29 DIAGNOSIS — Z3A39 39 weeks gestation of pregnancy: Secondary | ICD-10-CM | POA: Diagnosis present

## 2014-08-29 DIAGNOSIS — D62 Acute posthemorrhagic anemia: Secondary | ICD-10-CM | POA: Diagnosis not present

## 2014-08-29 DIAGNOSIS — O24419 Gestational diabetes mellitus in pregnancy, unspecified control: Secondary | ICD-10-CM | POA: Diagnosis present

## 2014-08-29 DIAGNOSIS — E785 Hyperlipidemia, unspecified: Secondary | ICD-10-CM | POA: Diagnosis present

## 2014-08-29 DIAGNOSIS — F988 Other specified behavioral and emotional disorders with onset usually occurring in childhood and adolescence: Secondary | ICD-10-CM | POA: Diagnosis present

## 2014-08-29 DIAGNOSIS — O09523 Supervision of elderly multigravida, third trimester: Secondary | ICD-10-CM | POA: Diagnosis not present

## 2014-08-29 DIAGNOSIS — O9942 Diseases of the circulatory system complicating childbirth: Secondary | ICD-10-CM | POA: Diagnosis not present

## 2014-08-29 DIAGNOSIS — Z79899 Other long term (current) drug therapy: Secondary | ICD-10-CM | POA: Diagnosis not present

## 2014-08-29 DIAGNOSIS — M5116 Intervertebral disc disorders with radiculopathy, lumbar region: Secondary | ICD-10-CM | POA: Diagnosis present

## 2014-08-29 DIAGNOSIS — F419 Anxiety disorder, unspecified: Secondary | ICD-10-CM | POA: Diagnosis present

## 2014-08-29 DIAGNOSIS — O99824 Streptococcus B carrier state complicating childbirth: Secondary | ICD-10-CM | POA: Diagnosis present

## 2014-08-29 DIAGNOSIS — Z98891 History of uterine scar from previous surgery: Secondary | ICD-10-CM

## 2014-08-29 DIAGNOSIS — E876 Hypokalemia: Secondary | ICD-10-CM | POA: Diagnosis not present

## 2014-08-29 DIAGNOSIS — O99344 Other mental disorders complicating childbirth: Secondary | ICD-10-CM | POA: Diagnosis present

## 2014-08-29 DIAGNOSIS — F329 Major depressive disorder, single episode, unspecified: Secondary | ICD-10-CM | POA: Diagnosis present

## 2014-08-29 DIAGNOSIS — Z302 Encounter for sterilization: Secondary | ICD-10-CM

## 2014-08-29 DIAGNOSIS — O9962 Diseases of the digestive system complicating childbirth: Secondary | ICD-10-CM | POA: Diagnosis present

## 2014-08-29 DIAGNOSIS — R000 Tachycardia, unspecified: Secondary | ICD-10-CM | POA: Diagnosis not present

## 2014-08-29 DIAGNOSIS — R079 Chest pain, unspecified: Secondary | ICD-10-CM

## 2014-08-29 DIAGNOSIS — O2653 Maternal hypotension syndrome, third trimester: Secondary | ICD-10-CM | POA: Diagnosis present

## 2014-08-29 DIAGNOSIS — O3421 Maternal care for scar from previous cesarean delivery: Principal | ICD-10-CM | POA: Diagnosis present

## 2014-08-29 LAB — CBC WITH DIFFERENTIAL/PLATELET
BASOS ABS: 0 10*3/uL (ref 0.0–0.1)
BASOS PCT: 0 % (ref 0–1)
Eosinophils Absolute: 0 10*3/uL (ref 0.0–0.7)
Eosinophils Relative: 0 % (ref 0–5)
HEMATOCRIT: 27.2 % — AB (ref 36.0–46.0)
HEMOGLOBIN: 8.9 g/dL — AB (ref 12.0–15.0)
Lymphocytes Relative: 16 % (ref 12–46)
Lymphs Abs: 2.2 10*3/uL (ref 0.7–4.0)
MCH: 26.6 pg (ref 26.0–34.0)
MCHC: 32.7 g/dL (ref 30.0–36.0)
MCV: 81.4 fL (ref 78.0–100.0)
MONO ABS: 0.7 10*3/uL (ref 0.1–1.0)
Monocytes Relative: 5 % (ref 3–12)
NEUTROS ABS: 10.6 10*3/uL — AB (ref 1.7–7.7)
NEUTROS PCT: 79 % — AB (ref 43–77)
Platelets: 238 10*3/uL (ref 150–400)
RBC: 3.34 MIL/uL — AB (ref 3.87–5.11)
RDW: 16.4 % — AB (ref 11.5–15.5)
WBC: 13.5 10*3/uL — AB (ref 4.0–10.5)

## 2014-08-29 LAB — RPR: RPR: NONREACTIVE

## 2014-08-29 LAB — COMPREHENSIVE METABOLIC PANEL
ALT: 9 U/L — ABNORMAL LOW (ref 14–54)
ANION GAP: 10 (ref 5–15)
AST: 22 U/L (ref 15–41)
Albumin: 2.1 g/dL — ABNORMAL LOW (ref 3.5–5.0)
Alkaline Phosphatase: 108 U/L (ref 38–126)
BILIRUBIN TOTAL: 0.5 mg/dL (ref 0.3–1.2)
BUN: 6 mg/dL (ref 6–20)
CO2: 21 mmol/L — ABNORMAL LOW (ref 22–32)
Calcium: 7.9 mg/dL — ABNORMAL LOW (ref 8.9–10.3)
Chloride: 107 mmol/L (ref 101–111)
Creatinine, Ser: 0.66 mg/dL (ref 0.44–1.00)
GFR calc Af Amer: >60 mL/min (ref >60)
Glucose, Bld: 167 mg/dL — ABNORMAL HIGH (ref 65–99)
POTASSIUM: 2.9 mmol/L — AB (ref 3.5–5.1)
Sodium: 138 mmol/L (ref 135–145)
TOTAL PROTEIN: 5 g/dL — AB (ref 6.5–8.1)

## 2014-08-29 LAB — GLUCOSE, CAPILLARY
GLUCOSE-CAPILLARY: 98 mg/dL (ref 65–99)
GLUCOSE-CAPILLARY: 98 mg/dL (ref 65–99)
Glucose-Capillary: 176 mg/dL — ABNORMAL HIGH (ref 65–99)

## 2014-08-29 SURGERY — Surgical Case
Anesthesia: Spinal | Site: Abdomen | Laterality: Bilateral

## 2014-08-29 MED ORDER — KETOROLAC TROMETHAMINE 30 MG/ML IJ SOLN
INTRAMUSCULAR | Status: AC
  Filled 2014-08-29: qty 1

## 2014-08-29 MED ORDER — NALBUPHINE HCL 10 MG/ML IJ SOLN: 5.0000 mg | Freq: Once | INTRAMUSCULAR | Status: DC | PRN

## 2014-08-29 MED ORDER — ONDANSETRON HCL 4 MG/2ML IJ SOLN
INTRAMUSCULAR | Status: DC | PRN
  Administered 2014-08-29: 4 mg via INTRAVENOUS

## 2014-08-29 MED ORDER — PHENYLEPHRINE 8 MG IN D5W 100 ML (0.08MG/ML) PREMIX OPTIME
INJECTION | INTRAVENOUS | Status: DC | PRN
  Administered 2014-08-29: 60 ug/min via INTRAVENOUS

## 2014-08-29 MED ORDER — TETANUS-DIPHTH-ACELL PERTUSSIS 5-2.5-18.5 LF-MCG/0.5 IM SUSP: 0.5000 mL | Freq: Once | INTRAMUSCULAR | Status: DC

## 2014-08-29 MED ORDER — KETOROLAC TROMETHAMINE 30 MG/ML IJ SOLN
30.0000 mg | Freq: Once | INTRAMUSCULAR | Status: AC
  Administered 2014-08-29: 30 mg via INTRAVENOUS
  Filled 2014-08-29: qty 1

## 2014-08-29 MED ORDER — SCOPOLAMINE 1 MG/3DAYS TD PT72: 1.0000 | MEDICATED_PATCH | Freq: Once | TRANSDERMAL | Status: DC

## 2014-08-29 MED ORDER — POTASSIUM CHLORIDE CRYS ER 10 MEQ PO TBCR
10.0000 meq | EXTENDED_RELEASE_TABLET | Freq: Three times a day (TID) | ORAL | Status: DC
  Administered 2014-08-29 – 2014-08-31 (×5): 10 meq via ORAL
  Filled 2014-08-29 (×8): qty 1

## 2014-08-29 MED ORDER — PHENYLEPHRINE 40 MCG/ML (10ML) SYRINGE FOR IV PUSH (FOR BLOOD PRESSURE SUPPORT)
PREFILLED_SYRINGE | INTRAVENOUS | Status: AC
  Filled 2014-08-29: qty 10

## 2014-08-29 MED ORDER — IBUPROFEN 600 MG PO TABS
600.0000 mg | ORAL_TABLET | Freq: Four times a day (QID) | ORAL | Status: DC
  Administered 2014-08-29 – 2014-09-01 (×10): 600 mg via ORAL
  Filled 2014-08-29 (×10): qty 1

## 2014-08-29 MED ORDER — SODIUM CHLORIDE 0.9 % IJ SOLN: 3.0000 mL | INTRAMUSCULAR | Status: DC | PRN

## 2014-08-29 MED ORDER — LACTATED RINGERS IV SOLN
INTRAVENOUS | Status: DC | PRN
  Administered 2014-08-29 (×3): via INTRAVENOUS

## 2014-08-29 MED ORDER — FENTANYL CITRATE (PF) 100 MCG/2ML IJ SOLN
INTRAMUSCULAR | Status: AC
  Filled 2014-08-29: qty 4

## 2014-08-29 MED ORDER — LACTATED RINGERS IV SOLN
INTRAVENOUS | Status: DC | PRN
  Administered 2014-08-29: 08:00:00 via INTRAVENOUS

## 2014-08-29 MED ORDER — LACTATED RINGERS IV SOLN: INTRAVENOUS | Status: DC

## 2014-08-29 MED ORDER — OXYTOCIN 10 UNIT/ML IJ SOLN
INTRAMUSCULAR | Status: AC
  Filled 2014-08-29: qty 4

## 2014-08-29 MED ORDER — MEPERIDINE HCL 25 MG/ML IJ SOLN: 6.2500 mg | INTRAMUSCULAR | Status: DC | PRN

## 2014-08-29 MED ORDER — NALBUPHINE HCL 10 MG/ML IJ SOLN: 5.0000 mg | INTRAMUSCULAR | Status: DC | PRN

## 2014-08-29 MED ORDER — MORPHINE SULFATE (PF) 0.5 MG/ML IJ SOLN
INTRAMUSCULAR | Status: DC | PRN
  Administered 2014-08-29: .2 mg via EPIDURAL

## 2014-08-29 MED ORDER — KETOROLAC TROMETHAMINE 30 MG/ML IJ SOLN
30.0000 mg | Freq: Four times a day (QID) | INTRAMUSCULAR | Status: DC | PRN
  Administered 2014-08-29: 30 mg via INTRAMUSCULAR

## 2014-08-29 MED ORDER — HYDROMORPHONE HCL 2 MG PO TABS
2.0000 mg | ORAL_TABLET | ORAL | Status: DC | PRN
  Administered 2014-08-29 – 2014-09-01 (×9): 2 mg via ORAL
  Filled 2014-08-29 (×9): qty 1

## 2014-08-29 MED ORDER — SCOPOLAMINE 1 MG/3DAYS TD PT72
1.0000 | MEDICATED_PATCH | Freq: Once | TRANSDERMAL | Status: DC
  Administered 2014-08-29: 1.5 mg via TRANSDERMAL

## 2014-08-29 MED ORDER — METOCLOPRAMIDE HCL 5 MG/ML IJ SOLN
INTRAMUSCULAR | Status: DC | PRN
  Administered 2014-08-29: 10 mg via INTRAVENOUS

## 2014-08-29 MED ORDER — ACETAMINOPHEN 325 MG PO TABS: 650.0000 mg | ORAL_TABLET | ORAL | Status: DC | PRN

## 2014-08-29 MED ORDER — METOCLOPRAMIDE HCL 5 MG/ML IJ SOLN
INTRAMUSCULAR | Status: AC
  Filled 2014-08-29: qty 2

## 2014-08-29 MED ORDER — MEASLES, MUMPS & RUBELLA VAC ~~LOC~~ INJ
0.5000 mL | INJECTION | Freq: Once | SUBCUTANEOUS | Status: DC
Start: 2014-08-30 — End: 2014-08-30
  Filled 2014-08-29: qty 0.5

## 2014-08-29 MED ORDER — LACTATED RINGERS IV SOLN
INTRAVENOUS | Status: DC
Start: 2014-08-29 — End: 2014-08-30
  Administered 2014-08-29 – 2014-08-30 (×3): via INTRAVENOUS

## 2014-08-29 MED ORDER — DIPHENHYDRAMINE HCL 25 MG PO CAPS: 25.0000 mg | ORAL_CAPSULE | Freq: Four times a day (QID) | ORAL | Status: DC | PRN

## 2014-08-29 MED ORDER — FAMOTIDINE 20 MG PO TABS
20.0000 mg | ORAL_TABLET | Freq: Two times a day (BID) | ORAL | Status: DC
  Administered 2014-08-29 – 2014-09-01 (×6): 20 mg via ORAL
  Filled 2014-08-29 (×6): qty 1

## 2014-08-29 MED ORDER — ONDANSETRON HCL 4 MG/2ML IJ SOLN: 4.0000 mg | Freq: Three times a day (TID) | INTRAMUSCULAR | Status: DC | PRN

## 2014-08-29 MED ORDER — OXYTOCIN 40 UNITS IN LACTATED RINGERS INFUSION - SIMPLE MED
INTRAVENOUS | Status: DC | PRN
  Administered 2014-08-29: 40 [IU] via INTRAVENOUS

## 2014-08-29 MED ORDER — OXYTOCIN 40 UNITS IN LACTATED RINGERS INFUSION - SIMPLE MED: 62.5000 mL/h | INTRAVENOUS | Status: AC

## 2014-08-29 MED ORDER — SENNOSIDES-DOCUSATE SODIUM 8.6-50 MG PO TABS
2.0000 | ORAL_TABLET | ORAL | Status: DC
  Administered 2014-08-29 – 2014-09-01 (×3): 2 via ORAL
  Filled 2014-08-29 (×3): qty 2

## 2014-08-29 MED ORDER — DEXAMETHASONE SODIUM PHOSPHATE 4 MG/ML IJ SOLN
INTRAMUSCULAR | Status: DC | PRN
  Administered 2014-08-29: 4 mg via INTRAVENOUS

## 2014-08-29 MED ORDER — FENTANYL CITRATE (PF) 100 MCG/2ML IJ SOLN
INTRAMUSCULAR | Status: DC | PRN
  Administered 2014-08-29: 20 ug via INTRAVENOUS

## 2014-08-29 MED ORDER — PNEUMOCOCCAL VAC POLYVALENT 25 MCG/0.5ML IJ INJ
0.5000 mL | INJECTION | INTRAMUSCULAR | Status: DC
  Filled 2014-08-29: qty 0.5

## 2014-08-29 MED ORDER — WITCH HAZEL-GLYCERIN EX PADS: 1.0000 "application " | MEDICATED_PAD | CUTANEOUS | Status: DC | PRN

## 2014-08-29 MED ORDER — KETOROLAC TROMETHAMINE 30 MG/ML IJ SOLN: 30.0000 mg | Freq: Four times a day (QID) | INTRAMUSCULAR | Status: DC | PRN

## 2014-08-29 MED ORDER — SCOPOLAMINE 1 MG/3DAYS TD PT72
MEDICATED_PATCH | TRANSDERMAL | Status: AC
  Administered 2014-08-29: 1.5 mg via TRANSDERMAL
  Filled 2014-08-29: qty 1

## 2014-08-29 MED ORDER — PRENATAL MULTIVITAMIN CH
1.0000 | ORAL_TABLET | Freq: Every day | ORAL | Status: DC
  Administered 2014-08-30 – 2014-08-31 (×2): 1 via ORAL
  Filled 2014-08-29 (×2): qty 1

## 2014-08-29 MED ORDER — SIMETHICONE 80 MG PO CHEW
80.0000 mg | CHEWABLE_TABLET | ORAL | Status: DC | PRN
  Administered 2014-08-29 – 2014-08-30 (×3): 80 mg via ORAL
  Filled 2014-08-29 (×3): qty 1

## 2014-08-29 MED ORDER — PROMETHAZINE HCL 25 MG/ML IJ SOLN: 6.2500 mg | INTRAMUSCULAR | Status: DC | PRN

## 2014-08-29 MED ORDER — PHENYLEPHRINE 8 MG IN D5W 100 ML (0.08MG/ML) PREMIX OPTIME
INJECTION | INTRAVENOUS | Status: AC
  Filled 2014-08-29: qty 100

## 2014-08-29 MED ORDER — MENTHOL 3 MG MT LOZG: 1.0000 | LOZENGE | OROMUCOSAL | Status: DC | PRN

## 2014-08-29 MED ORDER — ONDANSETRON HCL 4 MG/2ML IJ SOLN
INTRAMUSCULAR | Status: AC
  Filled 2014-08-29: qty 2

## 2014-08-29 MED ORDER — DEXAMETHASONE SODIUM PHOSPHATE 4 MG/ML IJ SOLN
INTRAMUSCULAR | Status: AC
  Filled 2014-08-29: qty 1

## 2014-08-29 MED ORDER — DIPHENHYDRAMINE HCL 25 MG PO CAPS: 25.0000 mg | ORAL_CAPSULE | ORAL | Status: DC | PRN

## 2014-08-29 MED ORDER — MAGNESIUM HYDROXIDE 400 MG/5ML PO SUSP: 30.0000 mL | ORAL | Status: DC | PRN

## 2014-08-29 MED ORDER — LACTATED RINGERS IV SOLN
Freq: Once | INTRAVENOUS | Status: AC
  Administered 2014-08-29: 07:00:00 via INTRAVENOUS

## 2014-08-29 MED ORDER — DIBUCAINE 1 % RE OINT: 1.0000 "application " | TOPICAL_OINTMENT | RECTAL | Status: DC | PRN

## 2014-08-29 MED ORDER — BUPIVACAINE IN DEXTROSE 0.75-8.25 % IT SOLN
INTRATHECAL | Status: DC | PRN
  Administered 2014-08-29: 1.6 mL via INTRATHECAL

## 2014-08-29 MED ORDER — LANOLIN HYDROUS EX OINT: 1.0000 "application " | TOPICAL_OINTMENT | CUTANEOUS | Status: DC | PRN

## 2014-08-29 MED ORDER — NALOXONE HCL 1 MG/ML IJ SOLN
1.0000 ug/kg/h | INTRAMUSCULAR | Status: DC | PRN
  Filled 2014-08-29: qty 2

## 2014-08-29 MED ORDER — NALOXONE HCL 0.4 MG/ML IJ SOLN: 0.4000 mg | INTRAMUSCULAR | Status: DC | PRN

## 2014-08-29 MED ORDER — FENTANYL CITRATE (PF) 100 MCG/2ML IJ SOLN: 25.0000 ug | INTRAMUSCULAR | Status: DC | PRN

## 2014-08-29 MED ORDER — ZOLPIDEM TARTRATE 5 MG PO TABS: 5.0000 mg | ORAL_TABLET | Freq: Every evening | ORAL | Status: DC | PRN

## 2014-08-29 MED ORDER — DIPHENHYDRAMINE HCL 50 MG/ML IJ SOLN: 12.5000 mg | INTRAMUSCULAR | Status: DC | PRN

## 2014-08-29 MED ORDER — MORPHINE SULFATE 0.5 MG/ML IJ SOLN
INTRAMUSCULAR | Status: AC
  Filled 2014-08-29: qty 100

## 2014-08-29 SURGICAL SUPPLY — 35 items
APL SKNCLS STERI-STRIP NONHPOA (GAUZE/BANDAGES/DRESSINGS) ×1
BENZOIN TINCTURE PRP APPL 2/3 (GAUZE/BANDAGES/DRESSINGS) ×2 IMPLANT
CLAMP CORD UMBIL (MISCELLANEOUS) IMPLANT
CLOSURE WOUND 1/2 X4 (GAUZE/BANDAGES/DRESSINGS) ×1
CLOTH BEACON ORANGE TIMEOUT ST (SAFETY) ×3 IMPLANT
CONTAINER PREFILL 10% NBF 15ML (MISCELLANEOUS) IMPLANT
DRAPE SHEET LG 3/4 BI-LAMINATE (DRAPES) IMPLANT
DRSG OPSITE POSTOP 4X10 (GAUZE/BANDAGES/DRESSINGS) ×3 IMPLANT
DURAPREP 26ML APPLICATOR (WOUND CARE) ×3 IMPLANT
ELECT REM PT RETURN 9FT ADLT (ELECTROSURGICAL) ×3
ELECTRODE REM PT RTRN 9FT ADLT (ELECTROSURGICAL) ×1 IMPLANT
EXTRACTOR VACUUM KIWI (MISCELLANEOUS) IMPLANT
EXTRACTOR VACUUM M CUP 4 TUBE (SUCTIONS) IMPLANT
EXTRACTOR VACUUM M CUP 4' TUBE (SUCTIONS)
GLOVE BIO SURGEON STRL SZ8 (GLOVE) ×3 IMPLANT
GLOVE ORTHO TXT STRL SZ7.5 (GLOVE) ×3 IMPLANT
GOWN STRL REUS W/TWL LRG LVL3 (GOWN DISPOSABLE) ×6 IMPLANT
KIT ABG SYR 3ML LUER SLIP (SYRINGE) IMPLANT
NDL HYPO 25X5/8 SAFETYGLIDE (NEEDLE) ×1 IMPLANT
NEEDLE HYPO 25X5/8 SAFETYGLIDE (NEEDLE) ×3 IMPLANT
NS IRRIG 1000ML POUR BTL (IV SOLUTION) ×3 IMPLANT
PACK C SECTION WH (CUSTOM PROCEDURE TRAY) ×3 IMPLANT
PAD OB MATERNITY 4.3X12.25 (PERSONAL CARE ITEMS) ×3 IMPLANT
RTRCTR C-SECT PINK 25CM LRG (MISCELLANEOUS) ×3 IMPLANT
STRIP CLOSURE SKIN 1/2X4 (GAUZE/BANDAGES/DRESSINGS) ×1 IMPLANT
SUT CHROMIC 1 CTX 36 (SUTURE) ×6 IMPLANT
SUT PLAIN 0 NONE (SUTURE) IMPLANT
SUT PLAIN 2 0 XLH (SUTURE) IMPLANT
SUT VIC AB 0 CT1 27 (SUTURE) ×6
SUT VIC AB 0 CT1 27XBRD ANBCTR (SUTURE) ×2 IMPLANT
SUT VIC AB 2-0 CT1 27 (SUTURE) ×3
SUT VIC AB 2-0 CT1 TAPERPNT 27 (SUTURE) ×1 IMPLANT
SUT VIC AB 4-0 KS 27 (SUTURE) IMPLANT
TOWEL OR 17X24 6PK STRL BLUE (TOWEL DISPOSABLE) ×3 IMPLANT
TRAY FOLEY CATH SILVER 14FR (SET/KITS/TRAYS/PACK) ×3 IMPLANT

## 2014-08-29 NOTE — Progress Notes (Signed)
MOB was referred for history of depression/anxiety.  Referral is screened out by Clinical Social Worker because none of the following criteria appear to apply: -History of anxiety/depression during this pregnancy, or of post-partum depression. - Diagnosis of anxiety and/or depression within last 3 years - History of depression due to pregnancy loss/loss of child or  Per chart review, diagnosed with anxiety and depression at age 34. No acute symptoms noted during pregnancy.   Please contact the Clinical Social Worker if needs arise or upon MOB request.

## 2014-08-29 NOTE — Anesthesia Postprocedure Evaluation (Signed)
  Anesthesia Post-op Note  Patient: Tina [name]  Procedure(s) Performed: Procedure(s) with comments: CESAREAN SECTION WITH BILATERAL TUBAL LIGATION (Bilateral) - MD requests RNFA Sherie Don. RNFA confirmed 7/29...tms  Patient Location: PACU  Anesthesia Type:Spinal  Level of Consciousness: awake  Airway and Oxygen Therapy: Patient Spontanous Breathing  Post-op Pain: none  Post-op Assessment: Post-op Vital signs reviewed, Patient's Cardiovascular Status Stable, Respiratory Function Stable and No signs of Nausea or vomiting              Post-op Vital Signs: Reviewed and stable  Last Vitals:  Filed Vitals:   08/29/14 1000  BP: 106/76  Pulse: 78  Temp: 36.9 C  Resp: 16    Complications: No apparent anesthesia complications

## 2014-08-29 NOTE — Progress Notes (Signed)
Subjective: Postpartum Day 0: Cesarean Delivery Patient reports mid sternal chest pain radiating into right shoulder. Incisional pain controlled with meds. No palpitations or diaphoresis. Tolerating ice chips and clears with no n/v. No further dizziness or near syncopal events  Objective: Vital signs in last 24 hours: Temp:  [97.6 F (36.4 C)-99.2 F (37.3 C)] 98.4 F (36.9 C) (08/16 1307) Pulse Rate:  [50-149] 82 (08/16 1500) Resp:  [12-20] 18 (08/16 1417) BP: (70-126)/(50-87) 101/59 mmHg (08/16 1500) SpO2:  [92 %-100 %] 100 % (08/16 1500) Weight:  [125.646 kg (277 lb)] 125.646 kg (277 lb) (08/16 1100)  Physical Exam:  General: alert, cooperative, no distress, mildly obese and talking in clear uninterrupted sentences Lochia: appropriate Uterine Fundus: firm Incision: no significant drainage since last check DVT Evaluation: No evidence of DVT seen on physical exam.   Recent Labs  08/28/14 0915 08/29/14 1325  HGB 11.6* 8.9*  HCT 35.2* 27.2*    Assessment/Plan: Status post Cesarean section. Postoperative course complicated by vagal event likely triggered by eating solids too soon after surgery. Stable at this time. Cp reported but cxr reviewed and noted wnl  Continue current care- will wait to advance diet in am. Mylicon or pepcid for likely reflux Routine post op/pp care  [place] 08/29/2014, 5:40 PM

## 2014-08-29 NOTE — Progress Notes (Signed)
Patient ID: Tina [name], female   DOB: [birth year], 34 y.o.   MRN: [id number] Called by nurse re pt around 1300. Pt's bp had dropped and pt appeared had eyes open but was not responding; bleeding mild. I advised her to start LR at 110ml while I set off to the hospital.  On arrival it appears although pt was ordered to be on liquid diet she had eaten a burger and about 20 mins later ( per FOB) family noticed she had turned pale and was staring blankly at them and not responding voice commands. They alerted nursing who then alerted me.  En route I had been informed that her pressure had dropped further to 70s/50s and she was now tachycardic into 115s hence ! agreed to bolus 561ml of LR. Rapid response was also called.  I examined pt on arrival. She was communicative and apparently had more color in her face but her arms and legs were still pale and clammy. Fundus was 0.8XK below umbilicus. There was some new bright red blood on honeycomb dressing but likely due to her being positioned in bed. LR was being bolused./ BP now 80s/50s.  Stat CBC and CMP were ordered.  Pending results Pt still communicative and on O2 via mask. BP now 89/55 after 773ml of LR bolus.  Will continue to bolus another 361ml Will watch pt closely Likely mild aspiration although lungs clear.

## 2014-08-29 NOTE — Progress Notes (Addendum)
Kaylyn Layer in the room with patient when she suddenly became unresponsive; RN hit the emergency button.  Upon entering the room, the patient was responsive again, but very cold and clammy with +1 pulses in the foot.  Bp was 80's/50's, heart rate fluctuated from 90's to 110's.  O2 face mask at 2L was started with an O2 reading of 96.  MD Terri Piedra was notified of patient's condition.  Received orders; LR was started at 125.  While talking to the patient she became dizzy and lightheaded, so a bolus was started of LR at 999.  O2 face mask turned up to 15 L.  Never lost pulse, but HR dropped to 40's and bp dropped to 70's/50's.  Manual check was 70/60.  Dr. Terri Piedra updated, was not in house but trying to get here as soon as possible.  RR called.  Patient became unresponsive again so code button was hit.  Emptied 150 of Urine.  CBG was 176. Banga came to bedside approximately 5 minutes after calling the code.  Fundus stayed firm throughout encounter at about 1 below and bleeding mild.  RN Lesly Rubenstein currently in room with anesthesia at bedside.

## 2014-08-29 NOTE — Anesthesia Postprocedure Evaluation (Signed)
  Anesthesia Post-op Note  Patient: Tina [name]  Procedure(s) Performed: Procedure(s) with comments: CESAREAN SECTION WITH BILATERAL TUBAL LIGATION (Bilateral) - MD requests RNFA Sherie Don. RNFA confirmed 7/29...tms  Patient Location: Mother/Baby  Anesthesia Type:Spinal  Level of Consciousness: awake, alert  and oriented  Airway and Oxygen Therapy: Patient Spontanous Breathing  Post-op Pain: none  Post-op Assessment: Post-op Vital signs reviewed, Patient's Cardiovascular Status Stable, Respiratory Function Stable, No signs of Nausea or vomiting, Adequate PO intake, No headache, No backache, Spinal receding and Patient able to bend at knees              Post-op Vital Signs: Reviewed and stable  Last Vitals:  Filed Vitals:   08/29/14 1417  BP: 99/59  Pulse: 82  Temp:   Resp: 18    Complications: No apparent anesthesia complications

## 2014-08-29 NOTE — Interval H&P Note (Signed)
History and Physical Interval Note:  08/29/2014 7:09 AM  Tina [name]  has presented today for surgery, with the diagnosis of Repeat C/Section, Sterilization  The various methods of treatment have been discussed with the patient and family. After consideration of risks, benefits and other options for treatment, the patient has consented to  Procedure(s) with comments: Epworth (Bilateral) - MD requests RNFA Sherie Don. RNFA confirmed 7/29...tms as a surgical intervention .  The patient's history has been reviewed, patient examined, no change in status, stable for surgery.  I have reviewed the patient's chart and labs.  Questions were answered to the patient's satisfaction.     [name] D

## 2014-08-29 NOTE — Transfer of Care (Signed)
Immediate Anesthesia Transfer of Care Note  Patient: Tina [name]  Procedure(s) Performed: Procedure(s) with comments: CESAREAN SECTION WITH BILATERAL TUBAL LIGATION (Bilateral) - MD requests RNFA Sherie Don. RNFA confirmed 7/29...tms  Patient Location: PACU  Anesthesia Type:Spinal  Level of Consciousness: awake, alert  and oriented  Airway & Oxygen Therapy: Patient Spontanous Breathing  Post-op Assessment: Report given to RN and Post -op Vital signs reviewed and stable  Post vital signs: Reviewed and stable  Last Vitals:  Filed Vitals:   08/29/14 0558  BP: 121/87  Pulse: 99  Temp: 36.6 C  Resp: 18    Complications: No apparent anesthesia complications

## 2014-08-29 NOTE — Anesthesia Procedure Notes (Signed)
Spinal Patient location during procedure: OR Start time: 08/29/2014 7:25 AM End time: 08/29/2014 7:38 AM Staffing Anesthesiologist: Alexis Frock Preanesthetic Checklist Completed: patient identified, site marked, surgical consent, pre-op evaluation, timeout performed, IV checked, risks and benefits discussed and monitors and equipment checked Spinal Block Patient position: sitting Prep: site prepped and draped and DuraPrep Patient monitoring: heart rate, continuous pulse ox and blood pressure Approach: midline Location: L3-4 Injection technique: single-shot Needle Needle type: Pencil-Tip  Needle gauge: 24 G Needle length: 9 cm Needle insertion depth: 8 cm Assessment Sensory level: T4 Additional Notes No complications, marcaine 3.9JQ, fentanyl 59mcg, morphine 263mcg

## 2014-08-29 NOTE — Op Note (Signed)
Preoperative diagnosis: Intrauterine pregnancy at 39 weeks, previous c-section, A2 gestational diabetes, desires surgical sterility Postoperative diagnosis: Same Procedure: Repeat low transverse cesarean section without extensions, bilateral partial salpingectomy Surgeon: Cheri Fowler M.D. Assistant:  Ailene Ravel, RNFA Anesthesia: Spinal  Findings: Patient had normal gravid anatomy and delivered a viable female infant with Apgars of 9 and 9 weight pending Estimated blood loss: 800 cc Specimens: Placenta sent to labor and delivery, portions of bilateral tubes for routine pathology Complications: None  Procedure in detail: The patient was taken to the operating room and placed in the sitting position. Dr. Tresa Moore instilled spinal anesthesia.  She was then placed in the dorsosupine position with left tilt. Abdomen was then prepped and draped in the usual sterile fashion, and a foley catheter was inserted. The level of her anesthesia was found to be adequate. Abdomen was entered via a standard Pfannenstiel incision through her previous scar. Once the peritoneal cavity was entered, omental adhesions were taken down with Bovie and the Alexis disposable self-retaining retractor was placed and good visualization was achieved. A 4 cm transverse incision was then made in the lower uterine segment pushing the bladder inferior. Once the uterine cavity was entered the incision was extended digitally. The fetal vertex was grasped and delivered through the incision atraumatically. Mouth and nares were suctioned. The remainder of the infant then delivered atraumatically. Cord was doubly clamped and cut after one minute and the infant handed to the awaiting pediatric team. The placenta delivered spontaneously. Uterus was wiped dry with clean lap pad and all clots and debris were removed. Uterine incision was inspected and found to be free of extensions. Uterine incision was closed in 1 layer with running #1 Chromic. Tubes  and ovaries were inspected and found to be normal. Both fallopian tubes were identified and traced to their fimbriated ends.  The middle portion of each tube was grasped with a Babcock clamp and elevated.  The segment of tube was then ligated with 0 plain gut suture and the knuckle of tube removed sharply.  On bothe sides, ostia were identified and the stumps were hemostatic.  Uterine incision was inspected and found to be hemostatic. Bleeding from serosal edges was controlled with electrocautery. The Alexis retractor was removed. Subfascial space was irrigated and made hemostatic with electrocautery. Peritoneum was closed with 2-0 Vicryl.  Fascia was closed in running fashion starting at both ends and meeting in the middle with 0 Vicryl. Subcutaneous tissue was then irrigated and made hemostatic with electrocautery, then closed with running 2-0 plain gut. Skin was closed with running 4-0 Vicryl subcuticular suture followed by steri-strips and a sterile dressing. Patient tolerated the procedure well and was taken to the recovery in stable condition. Counts were correct x2, she received Ancef 3 g IV at the beginning of the procedure and she had PAS hose on throughout the procedure.

## 2014-08-29 NOTE — Lactation Note (Signed)
This note was copied from the chart of [place]. Lactation Consultation Note  Patient Name: Tina [name] OZDGU'Y Date: 08/29/2014 Reason for consult: Initial assessment Mom reports baby latched to right breast for few minutes in PACU but has not latched since. She has started to supplement. Right nipple erect/short shaft, left nipple flat. Attempted to assist Mom with latching to left nipple. Demonstrated how to use hand pump to pre-pump and hand expression to help with latch. Baby developed hiccups and could not latch at this visit. Discussed risk of early supplementation to BF success. Encouraged Mom to keep working with BF as baby is learning as well. Advised to BF with feeding ques, ask for assist as needed. Pre-pump as needed with hand pump, use breast compression to help with latch. If Mom decides to continue to supplement advised try breast first, supplemental guidelines per hours of age reviewed with parents. Discussed finger feeding using curved tipped syringe instead of bottle. Parents will consider. Baby is noted to have short lingual frenulum. Lactation brochure left for review, advised of OP services and support group. Encouraged to call for assist with feedings. Basic teaching reviewed.   Maternal Data Has patient been taught Hand Expression?: Yes Does the patient have breastfeeding experience prior to this delivery?: Yes  Feeding Feeding Type: Breast Fed Length of feed: 0 min  LATCH Score/Interventions Latch: Repeated attempts needed to sustain latch, nipple held in mouth throughout feeding, stimulation needed to elicit sucking reflex.  Audible Swallowing: None  Type of Nipple: Flat Intervention(s): Hand pump  Comfort (Breast/Nipple): Soft / non-tender     Hold (Positioning): Assistance needed to correctly position infant at breast and maintain latch. Intervention(s): Breastfeeding basics reviewed;Support Pillows;Position options;Skin to skin  LATCH  Score: 5  Lactation Tools Discussed/Used Tools: Pump Breast pump type: Manual WIC Program: Yes   Consult Status Consult Status: Follow-up Date: 08/30/14 Follow-up type: In-patient    Katrine Coho 08/29/2014, 6:48 PM

## 2014-08-29 NOTE — Addendum Note (Signed)
Addendum  created 08/29/14 1440 by Jonna Munro, CRNA   Modules edited: Notes Section   Notes Section:  File: [id number]

## 2014-08-29 NOTE — Consult Note (Signed)
Neonatology Note:   Attendance at C-section:    I was asked by Dr. Willis Modena to attend this repeat C/S at term. The mother is a G3P1A1 O pos, GBS pos with GDM, on glyburide. ROM at delivery, fluid clear. Infant vigorous with good spontaneous cry and tone.Delayed cord clamping was done. Needed no suctioning. Ap 9/9. Lungs clear to ausc in DR. To CN to care of Pediatrician.   Real Cons, MD

## 2014-08-29 NOTE — Progress Notes (Signed)
MD updated on CBC and CMP. MD stated to do BMP in morning and to monitor patient closely.

## 2014-08-29 NOTE — Progress Notes (Signed)
Patient complains of chest discomfort radiating around to her shoulder blades along with pain while taking deep breaths. Vitals stable - see vital signs. Dr. Terri Piedra notified and chest xray orders.

## 2014-08-30 ENCOUNTER — Encounter (HOSPITAL_COMMUNITY): Payer: Self-pay | Admitting: Obstetrics and Gynecology

## 2014-08-30 LAB — CBC
HEMATOCRIT: 22.7 % — AB (ref 36.0–46.0)
HEMATOCRIT: 19.6 % — AB (ref 36.0–46.0)
HEMOGLOBIN: 7.4 g/dL — AB (ref 12.0–15.0)
HEMOGLOBIN: 6.2 g/dL — AB (ref 12.0–15.0)
MCH: 26.7 pg (ref 26.0–34.0)
MCH: 26.8 pg (ref 26.0–34.0)
MCHC: 32.6 g/dL (ref 30.0–36.0)
MCHC: 31.6 g/dL (ref 30.0–36.0)
MCV: 81.9 fL (ref 78.0–100.0)
MCV: 84.8 fL (ref 78.0–100.0)
PLATELETS: 216 10*3/uL (ref 150–400)
Platelets: 182 10*3/uL (ref 150–400)
RBC: 2.77 MIL/uL — AB (ref 3.87–5.11)
RBC: 2.31 MIL/uL — AB (ref 3.87–5.11)
RDW: 17.1 % — ABNORMAL HIGH (ref 11.5–15.5)
RDW: 17.4 % — ABNORMAL HIGH (ref 11.5–15.5)
WBC: 12.6 10*3/uL — AB (ref 4.0–10.5)
WBC: 9.1 10*3/uL (ref 4.0–10.5)

## 2014-08-30 LAB — BASIC METABOLIC PANEL
ANION GAP: 5 (ref 5–15)
BUN: 7 mg/dL (ref 6–20)
CALCIUM: 7.8 mg/dL — AB (ref 8.9–10.3)
CO2: 24 mmol/L (ref 22–32)
Chloride: 109 mmol/L (ref 101–111)
Creatinine, Ser: 0.71 mg/dL (ref 0.44–1.00)
GFR calc non Af Amer: >60 mL/min (ref >60)
Glucose, Bld: 111 mg/dL — ABNORMAL HIGH (ref 65–99)
POTASSIUM: 3.3 mmol/L — AB (ref 3.5–5.1)
Sodium: 138 mmol/L (ref 135–145)

## 2014-08-30 LAB — PREPARE RBC (CROSSMATCH)

## 2014-08-30 LAB — GLUCOSE, CAPILLARY: GLUCOSE-CAPILLARY: 93 mg/dL (ref 65–99)

## 2014-08-30 LAB — BIRTH TISSUE RECOVERY COLLECTION (PLACENTA DONATION)

## 2014-08-30 MED ORDER — FERROUS SULFATE 325 (65 FE) MG PO TABS
325.0000 mg | ORAL_TABLET | Freq: Two times a day (BID) | ORAL | Status: DC
  Administered 2014-08-30 – 2014-09-01 (×4): 325 mg via ORAL
  Filled 2014-08-30 (×4): qty 1

## 2014-08-30 MED ORDER — SODIUM CHLORIDE 0.9 % IV SOLN: Freq: Once | INTRAVENOUS | Status: DC

## 2014-08-30 NOTE — Progress Notes (Signed)
Subjective: Postpartum Day #1: Cesarean Delivery Patient reports incisional pain and tolerating PO.  She is feeling better  Objective: Vital signs in last 24 hours: Temp:  [97.6 F (36.4 C)-99.2 F (37.3 C)] 98.4 F (36.9 C) (08/17 0500) Pulse Rate:  [50-149] 86 (08/17 0500) Resp:  [16-20] 20 (08/17 0500) BP: (70-126)/(50-76) 107/62 mmHg (08/17 0500) SpO2:  [92 %-100 %] 96 % (08/17 0600) Weight:  [125.646 kg (277 lb)] 125.646 kg (277 lb) (08/16 1100)  Physical Exam:  General: alert Lochia: appropriate Uterine Fundus: firm Incision: dressing intact with moderate serosanguinous drainage  Recent Labs  08/29/14 1325 08/30/14 0525  HGB 8.9* 7.4*  HCT 27.2* 22.7*  K+ 3.3  Assessment/Plan: Status post Cesarean section. Doing well postoperatively.   Hgb has fallen more than I would have expected, she is hemodynamically stable, potassium improved Continue current care, ambulate, advance diet.  Will continue PO K+ today, recheck CBC at noon to make sure it is stable, recheck K+ tomorrow.  Dorthey Depace D 08/30/2014, 9:13 AM

## 2014-08-30 NOTE — Progress Notes (Signed)
Discussed blood transfusion with pt.  She states Dr. Willis Modena explained the benefits and risks.  She is voiding, up to BR indepentantly, no complaints of weakness or dizziness.  Lochia is WNL, she is Firm U!.  her abd is soft nonditended, + flatus.  Dressing changed at 1615, it wasn't completely saturated, but R side from midline to distal end had oozed serousanguinous drainage.  Incision approximated, however some sterri's on that R side are loose.  BP is low, pt states that is baseline for her. She is tolerating a regular diet, her 2 hr Post prandial CBG was 93, has voided 3 x's, 375, 250+, and once missed the hat.  I have discontinued I&O's.  Edmonia Caprio RN

## 2014-08-30 NOTE — Progress Notes (Signed)
Hgb now down to 6.2 She remains hemodynamically stable, but must have some intra-abdominal bleeding somewhere Will transfuse [facility], discussed risks/benefits Will watch closely to make sure I don;t need to take her back to the OR

## 2014-08-30 NOTE — Lactation Note (Signed)
This note was copied from the chart of [place]. Lactation Consultation Note Follow up visit at 79 hours of age.  Mom reports left nipple flattens and baby doesn't latch well.  Mom is using hand pump.  Assisted with football hold on left breast.  Baby latched well with wide flanged mouth.  Baby has strong sucking burst with stimulation needed to continue feeding.  Mom denies pain with latch although short frenulum noted when baby opened mouth. Mom supplementing with formula a few times today.  Mom to call for assist as needed.    Patient Name: Tina [name] WGNFA'O Date: 08/30/2014 Reason for consult: Follow-up assessment   Maternal Data Has patient been taught Hand Expression?: Yes  Feeding Feeding Type: Breast Fed Length of feed:  (observed 8 minutes)  LATCH Score/Interventions Latch: Repeated attempts needed to sustain latch, nipple held in mouth throughout feeding, stimulation needed to elicit sucking reflex. Intervention(s): Adjust position;Assist with latch;Breast massage;Breast compression  Audible Swallowing: A few with stimulation Intervention(s): Hand expression;Skin to skin Intervention(s): Hand expression  Type of Nipple: Flat Intervention(s): Hand pump  Comfort (Breast/Nipple): Soft / non-tender     Hold (Positioning): Assistance needed to correctly position infant at breast and maintain latch. Intervention(s): Breastfeeding basics reviewed;Skin to skin;Position options;Support Pillows  LATCH Score: 6  Lactation Tools Discussed/Used Tools: Pump Breast pump type: Manual   Consult Status Consult Status: Follow-up Date: 08/31/14 Follow-up type: In-patient    Kendell Bane Justine Null 08/30/2014, 8:36 PM

## 2014-08-30 NOTE — Progress Notes (Signed)
Subjective: Postpartum Day 1: Cesarean Delivery Patient reports incisional pain, tolerating PO and no problems voiding.  Getting blood transfusion.  Yesterday had episode of hypotension, now Hgb decreasing.   Objective: Vital signs in last 24 hours: Temp:  [98.4 F (36.9 C)-99.3 F (37.4 C)] 99.3 F (37.4 C) (08/17 1324) Pulse Rate:  [78-114] 106 (08/17 1324) Resp:  [18-20] 18 (08/17 1324) BP: (99-116)/(55-64) 116/55 mmHg (08/17 1324) SpO2:  [95 %-98 %] 97 % (08/17 0830)  Physical Exam:  General: alert and no distress Lochia: appropriate Uterine Fundus: firm Abd: soft, FFNT at umbilicus, some guarding, no rebound, +BS Incision: healing well DVT Evaluation: No evidence of DVT seen on physical exam.   Recent Labs  08/30/14 0525 08/30/14 1155  HGB 7.4* 6.2*  HCT 22.7* 19.6*    Assessment/Plan: Status post Cesarean section. Postoperative course complicated by blood transfusion, decreased Hgb  Continue current care.  [name], Tina Ognibene 08/30/2014, 5:22 PM

## 2014-08-30 NOTE — Progress Notes (Signed)
UR chart review completed.  

## 2014-08-31 LAB — CBC
HCT: 18.9 % — ABNORMAL LOW (ref 36.0–46.0)
HCT: 19.4 % — ABNORMAL LOW (ref 36.0–46.0)
HEMOGLOBIN: 6.2 g/dL — AB (ref 12.0–15.0)
Hemoglobin: 6.4 g/dL — CL (ref 12.0–15.0)
MCH: 27.7 pg (ref 26.0–34.0)
MCH: 27.9 pg (ref 26.0–34.0)
MCHC: 32.8 g/dL (ref 30.0–36.0)
MCHC: 33 g/dL (ref 30.0–36.0)
MCV: 84.4 fL (ref 78.0–100.0)
MCV: 84.7 fL (ref 78.0–100.0)
Platelets: 129 10*3/uL — ABNORMAL LOW (ref 150–400)
Platelets: 146 K/uL — ABNORMAL LOW (ref 150–400)
RBC: 2.24 MIL/uL — AB (ref 3.87–5.11)
RBC: 2.29 MIL/uL — ABNORMAL LOW (ref 3.87–5.11)
RDW: 17.1 % — ABNORMAL HIGH (ref 11.5–15.5)
RDW: 17.3 % — ABNORMAL HIGH (ref 11.5–15.5)
WBC: 7.6 10*3/uL (ref 4.0–10.5)
WBC: 7.1 K/uL (ref 4.0–10.5)

## 2014-08-31 LAB — BASIC METABOLIC PANEL
ANION GAP: 4 — AB (ref 5–15)
BUN: 8 mg/dL (ref 6–20)
CALCIUM: 8.3 mg/dL — AB (ref 8.9–10.3)
CO2: 27 mmol/L (ref 22–32)
Chloride: 111 mmol/L (ref 101–111)
Creatinine, Ser: 0.56 mg/dL (ref 0.44–1.00)
Glucose, Bld: 95 mg/dL (ref 65–99)
POTASSIUM: 3 mmol/L — AB (ref 3.5–5.1)
Sodium: 142 mmol/L (ref 135–145)

## 2014-08-31 LAB — GLUCOSE, CAPILLARY
GLUCOSE-CAPILLARY: 133 mg/dL — AB (ref 65–99)
Glucose-Capillary: 115 mg/dL — ABNORMAL HIGH (ref 65–99)

## 2014-08-31 LAB — HEMOGLOBIN AND HEMATOCRIT, BLOOD
HEMATOCRIT: 19.4 % — AB (ref 36.0–46.0)
HEMOGLOBIN: 6.5 g/dL — AB (ref 12.0–15.0)

## 2014-08-31 NOTE — Progress Notes (Signed)
Subjective: Postpartum Day #2: Cesarean Delivery Patient reports incisional pain and tolerating PO.  Ambulating ok, feels ok.  Tolerated one unit PRBC, but IV infiltrated and she declined having it replaced  Objective: Vital signs in last 24 hours: Temp:  [98.9 F (37.2 C)-99.3 F (37.4 C)] 99.2 F (37.3 C) (08/17 1948) Pulse Rate:  [78-114] 114 (08/17 1948) Resp:  [18-20] 20 (08/17 1948) BP: (105-134)/(55-68) 134/68 mmHg (08/17 1948) SpO2:  [96 %-97 %] 96 % (08/17 1948)  Physical Exam:  General: alert Lochia: appropriate Uterine Fundus: firm Incision: healing well    Recent Labs  08/31/14 0103 08/31/14 0540  HGB 6.5* 6.2*  HCT 19.4* 18.9*    Assessment/Plan: Status post Cesarean section. Has acute blood loss anemia, must have intra-abdominal bleeding.  she is hemodynamically stable, ambulates without symptoms.  Had one unit PRBC, Hgb same today as yesterday  Push fluids, will recheck Hgb at 1200.  If Hgb is stable will continue to monitor, if it has dropped further I have discussed that she will need IV replaced, will need more blood and that we will need to decide on trying interventional radiology to find and embolize bleeding vessel or take her back to the OR..  Samay Delcarlo D 08/31/2014, 8:15 AM

## 2014-09-01 LAB — TYPE AND SCREEN
ABO/RH(D): O POS
Antibody Screen: NEGATIVE
Unit division: 0
Unit division: 0

## 2014-09-01 LAB — CBC
HEMATOCRIT: 19.2 % — AB (ref 36.0–46.0)
Hemoglobin: 6.3 g/dL — CL (ref 12.0–15.0)
MCH: 28 pg (ref 26.0–34.0)
MCHC: 32.8 g/dL (ref 30.0–36.0)
MCV: 85.3 fL (ref 78.0–100.0)
Platelets: 138 10*3/uL — ABNORMAL LOW (ref 150–400)
RBC: 2.25 MIL/uL — ABNORMAL LOW (ref 3.87–5.11)
RDW: 17.3 % — AB (ref 11.5–15.5)
WBC: 6.3 10*3/uL (ref 4.0–10.5)

## 2014-09-01 MED ORDER — HYDROMORPHONE HCL 2 MG PO TABS: 2.0000 mg | ORAL_TABLET | ORAL | Status: DC | PRN

## 2014-09-01 MED ORDER — IBUPROFEN 600 MG PO TABS: 600.0000 mg | ORAL_TABLET | Freq: Four times a day (QID) | ORAL | Status: DC

## 2014-09-01 NOTE — Lactation Note (Signed)
This note was copied from the chart of [place]. Lactation Consultation Note  Follow up visit made prior to discharge.  Mom states baby has been eating frequently but difficult latch on left side.  Breasts are becoming full.  Nipples erect but left a little less.  Baby is currently sleeping.  Mom fit with 24 mm nipple shield and proper application taught to mom.  Reviewed discharge teaching and encouraged to call out for feeding assist prior to discharge.  Lactation outpatient services and support encouraged.  Patient Name: Boy Chyrl Elwell XIHWT'U Date: 09/01/2014     Maternal Data    Feeding Feeding Type: Breast Fed Length of feed: 15 min  LATCH Score/Interventions Latch: Grasps breast easily, tongue down, lips flanged, rhythmical sucking. Intervention(s): Adjust position;Assist with latch  Audible Swallowing: A few with stimulation Intervention(s): Skin to skin Intervention(s): Skin to skin  Type of Nipple: Everted at rest and after stimulation  Comfort (Breast/Nipple): Soft / non-tender     Hold (Positioning): Assistance needed to correctly position infant at breast and maintain latch. Intervention(s): Support Pillows;Position options  LATCH Score: 8  Lactation Tools Discussed/Used     Consult Status      Ave Filter 09/01/2014, 10:32 AM

## 2014-09-01 NOTE — Discharge Instructions (Signed)
As per discharge pamphlet °

## 2014-09-01 NOTE — Discharge Summary (Signed)
Obstetric Discharge Summary Reason for Admission: cesarean section Prenatal Procedures: none Intrapartum Procedures: cesarean: low cervical, transverse and tubal ligation Postpartum Procedures: transfusion one unit PRBC Complications-Operative and Postpartum: hemorrhage HEMOGLOBIN  Date Value Ref Range Status  09/01/2014 6.3* 12.0 - 15.0 g/dL Final    Comment:    REPEATED TO VERIFY CRITICAL RESULT CALLED TO, READ BACK BY AND VERIFIED WITH: HERNANDEZ,M @0652  ON [id number] BY FLEMINGS    HCT  Date Value Ref Range Status  09/01/2014 19.2* 36.0 - 46.0 % Final    Physical Exam:  General: alert Lochia: appropriate Uterine Fundus: firm Incision: healing well   Discharge Diagnoses: Term Pregnancy-delivered and acute blood loss anemia  Discharge Information: Date: 09/01/2014 Activity: pelvic rest and no strenuous activity Diet: routine Medications: Ibuprofen, Iron and Dilaudid Condition: stable Instructions: refer to practice specific booklet Discharge to: home Follow-up Information    Follow up with Evangelyne Loja D, MD. Schedule an appointment as soon as possible for a visit in 2 weeks.   Specialty:  Obstetrics and Gynecology   Contact information:   [address], [place] [address] [phone]       Newborn Data: Live born female  Birth Weight: 7 lb 11.8 oz (3510 g) APGAR: 9, 9  Home with mother.  Marcea Rojek D 09/01/2014, 7:51 AM

## 2014-09-01 NOTE — Progress Notes (Signed)
POD #3 Doing ok, ambulating, sore LLQ Afeb, VSS Abd- soft, slightly tender LLQ, no mass, fundus firm, incision intact Hgb stable at 6.3 D/c home, acute blood loss anemia is stable-will treat with Fe at home

## 2015-01-08 ENCOUNTER — Encounter (HOSPITAL_COMMUNITY): Payer: Self-pay | Admitting: *Deleted

## 2015-01-08 ENCOUNTER — Emergency Department (HOSPITAL_COMMUNITY)
Admission: EM | Admit: 2015-01-08 | Discharge: 2015-01-08 | Disposition: A | Discharge status: Home / Self Care | Payer: Medicaid Other | Attending: Emergency Medicine | Admitting: Emergency Medicine

## 2015-01-08 DIAGNOSIS — Z8659 Personal history of other mental and behavioral disorders: Secondary | ICD-10-CM | POA: Diagnosis not present

## 2015-01-08 DIAGNOSIS — Z79899 Other long term (current) drug therapy: Secondary | ICD-10-CM | POA: Diagnosis not present

## 2015-01-08 DIAGNOSIS — G43009 Migraine without aura, not intractable, without status migrainosus: Secondary | ICD-10-CM | POA: Insufficient documentation

## 2015-01-08 DIAGNOSIS — K219 Gastro-esophageal reflux disease without esophagitis: Secondary | ICD-10-CM | POA: Insufficient documentation

## 2015-01-08 DIAGNOSIS — Z872 Personal history of diseases of the skin and subcutaneous tissue: Secondary | ICD-10-CM | POA: Insufficient documentation

## 2015-01-08 DIAGNOSIS — Z87891 Personal history of nicotine dependence: Secondary | ICD-10-CM | POA: Diagnosis not present

## 2015-01-08 DIAGNOSIS — M5441 Lumbago with sciatica, right side: Secondary | ICD-10-CM

## 2015-01-08 DIAGNOSIS — Z791 Long term (current) use of non-steroidal anti-inflammatories (NSAID): Secondary | ICD-10-CM | POA: Diagnosis not present

## 2015-01-08 DIAGNOSIS — Z8632 Personal history of gestational diabetes: Secondary | ICD-10-CM | POA: Diagnosis not present

## 2015-01-08 DIAGNOSIS — Z8639 Personal history of other endocrine, nutritional and metabolic disease: Secondary | ICD-10-CM | POA: Insufficient documentation

## 2015-01-08 DIAGNOSIS — M545 Low back pain: Secondary | ICD-10-CM | POA: Diagnosis present

## 2015-01-08 DIAGNOSIS — Z8709 Personal history of other diseases of the respiratory system: Secondary | ICD-10-CM | POA: Insufficient documentation

## 2015-01-08 MED ORDER — KETOROLAC TROMETHAMINE 60 MG/2ML IM SOLN
60.0000 mg | Freq: Once | INTRAMUSCULAR | Status: AC
  Administered 2015-01-08: 60 mg via INTRAMUSCULAR
  Filled 2015-01-08: qty 2

## 2015-01-08 MED ORDER — DICLOFENAC SODIUM 1 % TD GEL
2.0000 g | Freq: Once | TRANSDERMAL | Status: AC
  Administered 2015-01-08: 2 g via TOPICAL
  Filled 2015-01-08: qty 100

## 2015-01-08 MED ORDER — PREDNISONE 20 MG PO TABS
60.0000 mg | ORAL_TABLET | Freq: Once | ORAL | Status: AC
  Administered 2015-01-08: 60 mg via ORAL
  Filled 2015-01-08: qty 3

## 2015-01-08 MED ORDER — IBUPROFEN 800 MG PO TABS: 800.0000 mg | ORAL_TABLET | Freq: Three times a day (TID) | ORAL | Status: DC

## 2015-01-08 MED ORDER — METHOCARBAMOL 500 MG PO TABS: 500.0000 mg | ORAL_TABLET | Freq: Two times a day (BID) | ORAL | Status: DC

## 2015-01-08 MED ORDER — DICLOFENAC SODIUM 1 % TD GEL: 2.0000 g | Freq: Four times a day (QID) | TRANSDERMAL | Status: DC

## 2015-01-08 MED ORDER — LIDOCAINE 5 % EX PTCH: 1.0000 | MEDICATED_PATCH | CUTANEOUS | Status: DC

## 2015-01-08 NOTE — ED Notes (Addendum)
Pt had back surgery in Nov 2009. Presents today with back pain primarily on the right side. Denies any injury but noticed the pain getting worse. Pt states she has been taking motrin for this w/o relief. Pt had  A C-section August 16th 2016-Report to [place] . 7:15pm

## 2015-01-08 NOTE — ED Provider Notes (Signed)
CSN: [id number]     Arrival date & time 01/08/15  1811 History  By signing my name below, I, Meriel Pica, attest that this documentation has been prepared under the direction and in the presence of Shawn Joy, PA-C. Electronically Signed: Meriel Pica, ED Scribe. 01/08/2015. 7:15 PM.   Chief Complaint  Patient presents with  . Back Pain   The history is provided by the patient. No language interpreter was used.   HPI Comments: Tina [name] is a 34 y.o. female, with a PMhx of degenerative disc disease and right lumbar radiculopathy, who presents to the Emergency Department complaining of a gradually worsening, constant, severe right-sided lower back pain X 7 days that she describes as a tightness, and that radiates into her right hip. She has been taking ibuprofen and applying a heating pad without significant relief. Pt notes no known trauma or injury. Her pain is worse with movement and palpation. She has a history of right-sided lower back pain with a PShx to back in 2009. Denies fevers, chills, bowel or bladder incontinence, saddle paraesthesia, difficulty ambulating, numbness, tingling or weakness. No overlying skin changes. Pt is not followed by a PCP. Allergy to Percocet.   Past Medical History  Diagnosis Date  . LUMBAR RADICULOPATHY, RIGHT   . HYPERLIPIDEMIA   . ANXIETY   . DEPRESSION   . ADD   . COMMON MIGRAINE   . GERD   . CHOLELITHIASIS   . Pruritus ani   . Unspecified backache   . HYPERSOMNIA   . SMOKER   . 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 Allergic rhinitis, cause unspecified 05/08/2011  . ADD (attention deficit disorder)   . GDM (gestational diabetes mellitus)   . PONV (postoperative nausea and vomiting)     peri op vomiting   Past Surgical History  Procedure Laterality Date  . Cesarean section      x 1  . Tonsillectomy    . Back surgery  11/09  . Cesarean section with bilateral tubal ligation Bilateral 08/29/2014    Procedure:  CESAREAN SECTION WITH BILATERAL TUBAL LIGATION;  Surgeon: Cheri Fowler, MD;  Location: [facility];  Service: Obstetrics;  Laterality: Bilateral;  MD requests RNFA [name] confirmed 7/29...tms   Family History  Problem Relation Age of Onset  . Depression Mother   . Mental illness Mother   . Diabetes Father   . Hypertension Father   . Hyperlipidemia Father   . Diabetes Maternal Grandmother     Dialysis  . Hypertension Maternal Grandmother   . Heart disease Maternal Grandmother   . Cancer Paternal Grandmother     Breast Cancer  . Cancer Other     colon  . Kidney failure Other     dialysis  . Cancer Other     colon  . Diabetes Brother    Social History  Substance Use Topics  . Smoking status: Former Smoker -- 0.50 packs/day    Types: Cigarettes    Quit date: 01/09/2014  . Smokeless tobacco: Never Used  . Alcohol Use: No   OB History    Gravida Para Term Preterm AB TAB SAB Ectopic Multiple Living   3 2 2  1  1   0 2     Review of Systems  Constitutional: Negative for fever and chills.  Musculoskeletal: Positive for back pain. Negative for gait problem.  Skin: Negative for color change and wound.  Neurological: Negative for weakness and numbness.  All other systems reviewed  and are negative.  Allergies  Percocet and Tramadol  Home Medications   Prior to Admission medications   Medication Sig Start Date End Date Taking? Authorizing Provider  ibuprofen (ADVIL,MOTRIN) 600 MG tablet Take 1 tablet (600 mg total) by mouth every 6 (six) hours. 09/01/14  Yes Cheri Fowler, MD  acetaminophen (TYLENOL) 500 MG tablet Take 1,000 mg by mouth every 6 (six) hours as needed for mild pain.    Historical Provider, MD  diclofenac sodium (VOLTAREN) 1 % GEL Apply 2 g topically 4 (four) times daily. 01/08/15   Shawn C Joy, PA-C  ferrous sulfate 325 (65 FE) MG tablet Take 325 mg by mouth daily.     Historical Provider, MD  HYDROmorphone (DILAUDID) 2 MG tablet Take 1 tablet (2 mg total)  by mouth every 3 (three) hours as needed for severe pain. 09/01/14   Cheri Fowler, MD  ibuprofen (ADVIL,MOTRIN) 800 MG tablet Take 1 tablet (800 mg total) by mouth 3 (three) times daily. 01/08/15   Shawn C Joy, PA-C  lidocaine (LIDODERM) 5 % Place 1 patch onto the skin daily. Remove & Discard patch within 12 hours or as directed by MD 01/08/15   Lorayne Bender, PA-C  methocarbamol (ROBAXIN) 500 MG tablet Take 1 tablet (500 mg total) by mouth 2 (two) times daily. 01/08/15   Shawn C Joy, PA-C  metoCLOPramide (REGLAN) 10 MG tablet Take 1 tablet (10 mg total) by mouth every 6 (six) hours as needed for nausea. 02/10/14   Lisa A Leftwich-Kirby, CNM  omeprazole (PRILOSEC) 20 MG capsule Take 20 mg by mouth daily.    Historical Provider, MD  Prenatal Vit-Fe Fumarate-FA (PRENATAL MULTIVITAMIN) TABS tablet Take 1 tablet by mouth daily at 12 noon.    Historical Provider, MD   BP 115/56 mmHg  Pulse 96  Temp(Src) 98.3 F (36.8 C) (Oral)  Resp 16  Ht 5\' 7"  (1.702 m)  Wt 240 lb (108.863 kg)  BMI 37.58 kg/m2  SpO2 100%  LMP 11/30/2014 Physical Exam  Constitutional: She is oriented to person, place, and time. She appears well-developed and well-nourished. No distress.  HENT:  Head: Normocephalic.  Eyes: Conjunctivae are normal.  Neck: Normal range of motion. Neck supple.  Cardiovascular: Normal rate, regular rhythm and intact distal pulses.   Pulmonary/Chest: Effort normal. No respiratory distress.  Musculoskeletal: Normal range of motion. She exhibits tenderness.  Tenderness to right-sided lumbar musculature and sacrum. Pain is reproducible on movement and palpation.   Neurological: She is alert and oriented to person, place, and time. She has normal reflexes. Coordination normal.  No sensory deficits. Strength 5/5 in all extremities. No gait disturbance.   Skin: Skin is warm and dry.  Psychiatric: She has a normal mood and affect. Her behavior is normal.  Nursing note and vitals reviewed.   ED Course   Procedures  DIAGNOSTIC STUDIES: Oxygen Saturation is 100% on RA, normal by my interpretation.    COORDINATION OF CARE: 7:13 PM Discussed treatment plan which includes to order toradol 60mg , Voltaren 1% gel, and prednisone 60mg  with pt. Will provide pt with resources to establish primary care. Pt acknowledges and agrees to plan.   MDM   Final diagnoses:  Right-sided low back pain with right-sided sciatica   Tina [name] presents with lower back pain on the right side.  Patient has no neurologic deficits and no red flag symptoms. Doubt cauda equina. Patient has no history of trauma. Patient is well-appearing and in no apparent distress. Patient states that  she feels much better here in the ED with conservative interventions. Patient was given home care instructions as well as return precautions. Patient voiced understanding of these instructions, accepts the plan, and is comfortable with discharge.  I personally performed the services described in this documentation, which was scribed in my presence. The recorded information has been reviewed and is accurate.    Lorayne Bender, PA-C 01/08/15 2016  Charlesetta Shanks, MD 01/20/15 [phone]

## 2015-01-08 NOTE — Discharge Instructions (Signed)
You have been seen today for back pain. Follow up with PCP as needed for chronic management of this issue. Return to ED should symptoms worsen. Use either the diclofenac cream or the Lidoderm patches, but do not use both at the same time.   Emergency Department Resource Guide 1) Find a Doctor and Pay Out of Pocket Although you won't have to find out who is covered by your insurance plan, it is a good idea to ask around and get recommendations. You will then need to call the office and see if the doctor you have chosen will accept you as a new patient and what types of options they offer for patients who are self-pay. Some doctors offer discounts or will set up payment plans for their patients who do not have insurance, but you will need to ask so you aren't surprised when you get to your appointment.  2) Contact Your Local Health Department Not all health departments have doctors that can see patients for sick visits, but many do, so it is worth a call to see if yours does. If you don't know where your local health department is, you can check in your phone book. The CDC also has a tool to help you locate your state's health department, and many state websites also have listings of all of their local health departments.  3) Find a [facility] If your illness is not likely to be very severe or complicated, you may want to try a walk in clinic. These are popping up all over the country in pharmacies, drugstores, and shopping centers. They're usually staffed by nurse practitioners or physician assistants that have been trained to treat common illnesses and complaints. They're usually fairly quick and inexpensive. However, if you have serious medical issues or chronic medical problems, these are probably not your best option.  No Primary Care Doctor: - Call Health Connect at  [phone] - they can help you locate a primary care doctor that  accepts your insurance, provides certain services,  etc. - Physician Referral Service- [phone]  Chronic Pain Problems: Organization         Address  Phone   Notes  [facility]  [phone] Patients need to be referred by their primary care doctor.   Medication Assistance: Organization         Address  Phone   Notes  [facility] Medication [facility] [address] [place], [place] [address] [phone] --Must be a resident of [facility] -- Must have NO insurance coverage whatsoever (no Medicaid/ Medicare, etc.) -- The pt. MUST have a primary care doctor that directs their care regularly and follows them in the community   MedAssist  [phone]   [employer]  [phone]    Agencies that provide inexpensive medical care: Organization         Address  Phone   Notes  [place]  [phone]   [facility]    [phone]   [facility] [place], [place] [address] [phone]   [place] [address] [place] [phone]   Planned Parenthood    [phone]   [facility]    [phone]   [place] and [address], [place] Phone:  [phone], Fax:  [phone] Hours of Operation:  9 am - 6 pm, M-F.  Also accepts Medicaid/Medicare and self-pay.  [facility] for [place] [address] [place], [address], [place] Phone: [phone], Fax: [phone]. Hours of Operation:  8:30 am - 5:30 pm, M-F.  Also accepts Medicaid and self-pay.  [facility] [place] [address] [place] Phone: [phone]   [place], [place], [place] [phone], Ext. [phone] Mondays & Thursdays: 7-9 AM.  First 15 patients are seen on a first come, first serve basis.    [place] Providers:  Organization         Address  Phone   Notes  [facility] [address],  [phone] Also accepts self-pay patients.  [facility] [address], [place]  [phone]   [place], [address], [place] [phone]   [facility] [address] [place] [phone]   Lucianne Lei [address], [place]   [phone] Only accepts [place] Access [place] patients after they have their name applied to their card.   Self-Pay (no insurance) in [facility]:  Organization         Address  Phone   Notes  Sickle Cell Patients, [facility] [place] [phone]   [facility]gent Care [place] [phone]   [facility] [place]  [place], [facility], [place] [phone]   [facility]/Dr. Osei-Bonsu  [address], [place] or [address], [place] [phone] Phone number for both [place] and [place] locations is the same.  Urgent Medical and [facility] [address] [place] [phone]   [facility] [address] [place] or [address] [phone]   [facility] [address], [place] [phone], phone; [phone], fax Sees patients 1st and 3rd Saturday of every month.  Must not qualify for public or private insurance (i.e. Medicaid, Medicare, Becker Health Choice, Veterans' Benefits)  Household income should be no more than 200% of the poverty level The clinic cannot treat you if you are pregnant or think you are pregnant  Sexually transmitted diseases are not treated at the clinic.    Dental Care: Organization         Address  Phone  Notes  [facility] Department of [facility] [phone] Accepts children up to  age 51 who are enrolled in [place] or [place]; pregnant women with a Medicaid card; and children who have applied for Medicaid or [place] Health Choice, but were declined, whose parents can pay a reduced fee at time of service.  [facility] Department of [facility]  [address], [place] [phone] Accepts children up to age 24 who are enrolled in [place] or [place]; pregnant women with a Medicaid card; and children who have applied for Medicaid or [place] Health Choice, but were declined, whose parents can pay a reduced fee at time of service.  [place] Adult Dental Access PROGRAM  [place] [phone] Patients are seen by appointment only. Walk-ins are not accepted. [place] will see patients 54 years of age and older. Monday - Tuesday (  8am-5pm) Most Wednesdays (8:30-5pm) $30 per visit, cash only  [facility] Adult Dental Access PROGRAM  [address], [facility] [phone] Patients are seen by appointment only. Walk-ins are not accepted. [place] will see patients 2 years of age and older. One Wednesday Evening (Monthly: Volunteer Based).  $30 per visit, cash only  [place]  [phone] for adults; Children under age 70, call Graduate Pediatric Dentistry at [phone]. Children aged 26-14, please call [phone] to request a pediatric application.  Dental services are provided in all areas of dental care including fillings, crowns and bridges, complete and partial dentures, implants, gum treatment, root canals, and extractions. Preventive care is also provided. Treatment is provided to both adults and children. Patients are selected via a lottery and there is often a waiting list.   [facility] [address], [place]  [phone] [email or web address]   [facility] [address] [place], [place] [phone], Ext. [phone] Second and Fourth Thursday of  each month, opens at 6:30 AM; Clinic ends at 9 AM.  Patients are seen on a first-come first-served basis, and a limited number are seen during each clinic.   [facility]  [address] Hillard Danker [place], [place] [phone]   Eligibility Requirements You must have lived in [place], [place], or [place] counties for at least the last three months.   You cannot be eligible for state or federal sponsored [employer], including [employer], [place], or [employer].   You generally cannot be eligible for healthcare insurance through your employer.    How to apply: Eligibility screenings are held every Tuesday and Wednesday afternoon from 1:00 pm until 4:00 pm. You do not need an appointment for the interview!  [facility] [address], [place], [place]   [place]  Grants Department  [place]  [phone]    Behavioral Health Resources in the Community: Intensive Outpatient Programs Organization         Address  Phone  Notes  [place]. [address] [place], [place] [phone]   [facility] Outpatient [address], [place], [place]   ADS: Alcohol & Drug Svcs [address] [place], [place]   [place] [address],  [place], [place] or [phone]   Substance Abuse Resources Organization         Address  Phone  Notes  Alcohol and Drug Services  [phone]   [place]  [phone]   [facility] [place]   Chinita Pester  [phone]   Residential & Outpatient Substance Abuse Program  [phone]   Psychological Services Organization         Address  Phone  Notes  [facility] [place]  [place]  [phone]   [place] [address]  [place] or [phone]    Mobile Crisis Teams Organization         Address  Phone  Notes  [facility], Mobile Crisis Care Unit  [phone]   [facility]  [address] [place], [place]   [place] [address], [place] [phone]    Self-Help/Support Groups Organization         Address  Phone  Notes  Mental Health Assoc. of [place] - variety of support groups  [place] Call for more information  Narcotics Anonymous (NA), [facility]aring [facility] [address], [employer] [place]  2 meetings at this location   Special educational needs teacher         Address  Phone  Notes  ASAP Residential Treatment [place],    [place]  [phone]   [facility]  [address], [place] [phone], [place], [place]   [place], [place] [phone] Admissions: 8am-3pm M-F  Incentives Substance [place] [address],    [place], [place] [phone]   [facility] [address] [place], [place], [place]   [facility] [address]  [place], [place]   [facility] - Intensive Outpatient [address], [place], [place]   [facility] ([place].,  [place], [place] [phone] or [phone]   [facility] Services (RTS) [address] [place], [place] Accepts Medicaid  [facility] [place] [address],  [place] [phone] Substance Abuse/Addiction Treatment   [facility] Organization         Address  Phone  Notes  [facility]  [phone]   Domenic Schwab, PhD [address] Arlis [place], [place]   [phone] or [phone]   [place], [place] [phone]     [facility] [address], [place], [place] [phone] Insurance/Medicaid/sponsorship through [facility] and Families [address], [address]                                    [place], [place] [phone] [place] [address], [place] [phone]    Dr. Adele Schilder  [phone]   [facility] Dept. 1) [address], [place] 2) [place] 3)  [address], [place] [phone]  [phone]   [place] [phone] or [phone] (After Hours)

## 2015-02-17 ENCOUNTER — Encounter (HOSPITAL_COMMUNITY): Payer: Self-pay | Admitting: Emergency Medicine

## 2015-02-17 ENCOUNTER — Emergency Department (HOSPITAL_COMMUNITY)
Admission: EM | Admit: 2015-02-17 | Discharge: 2015-02-17 | Disposition: A | Discharge status: Home / Self Care | Payer: Medicaid Other | Attending: Emergency Medicine | Admitting: Emergency Medicine

## 2015-02-17 DIAGNOSIS — Z87891 Personal history of nicotine dependence: Secondary | ICD-10-CM | POA: Insufficient documentation

## 2015-02-17 DIAGNOSIS — Z8632 Personal history of gestational diabetes: Secondary | ICD-10-CM | POA: Insufficient documentation

## 2015-02-17 DIAGNOSIS — M47896 Other spondylosis, lumbar region: Secondary | ICD-10-CM | POA: Diagnosis not present

## 2015-02-17 DIAGNOSIS — K219 Gastro-esophageal reflux disease without esophagitis: Secondary | ICD-10-CM | POA: Diagnosis not present

## 2015-02-17 DIAGNOSIS — Z8659 Personal history of other mental and behavioral disorders: Secondary | ICD-10-CM | POA: Diagnosis not present

## 2015-02-17 DIAGNOSIS — Z8669 Personal history of other diseases of the nervous system and sense organs: Secondary | ICD-10-CM | POA: Diagnosis not present

## 2015-02-17 DIAGNOSIS — R0989 Other specified symptoms and signs involving the circulatory and respiratory systems: Secondary | ICD-10-CM | POA: Diagnosis not present

## 2015-02-17 DIAGNOSIS — Z872 Personal history of diseases of the skin and subcutaneous tissue: Secondary | ICD-10-CM | POA: Insufficient documentation

## 2015-02-17 NOTE — ED Notes (Addendum)
Pt request evaluation of "knot" noted on left side of throat for approximately a month; denies recent sickness; reports increased anxiety related to concern; pt tearful with triage.

## 2015-02-17 NOTE — Discharge Instructions (Signed)
Please read attached information. If you experience any new or worsening signs or symptoms please return to the emergency room for evaluation. Please follow-up with your primary care provider or specialist as discussed. Please use medication prescribed only as directed and discontinue taking if you have any concerning signs or symptoms.   °

## 2015-02-17 NOTE — ED Provider Notes (Signed)
CSN: [id number]     Arrival date & time 02/17/15  1016 History   First MD Initiated Contact with Patient 02/17/15 1044     Chief Complaint  Patient presents with  . Knot of Throat      HPI   57 YOF female presents with complaints of a knot in her throat. Patient reports that this is been there for the last month. She reports that it feels a something is stuck in her throat. Patient reports that she can feel it with her tongue on the left back of her throat, but cannot see them you're. She denies any acute episode of large foreign body, changes in voice, fever, neck pain, reduced range of motion, night sweats, weight loss, history of cancer. She reports she is a smoker, notes that she smokes Black and mild. Patient has no other complaints other than the foreign body sensation in her throat. Patient reports she is able to eat and drink without difficulty       Past Medical History  Diagnosis Date  . LUMBAR RADICULOPATHY, RIGHT   . HYPERLIPIDEMIA   . ANXIETY   . DEPRESSION   . ADD   . COMMON MIGRAINE   . GERD   . CHOLELITHIASIS   . Pruritus ani   . Unspecified backache   . HYPERSOMNIA   . SMOKER   . 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 Allergic rhinitis, cause unspecified 05/08/2011  . ADD (attention deficit disorder)   . GDM (gestational diabetes mellitus)   . PONV (postoperative nausea and vomiting)     peri op vomiting   Past Surgical History  Procedure Laterality Date  . Cesarean section      x 1  . Tonsillectomy    . Back surgery  11/09  . Cesarean section with bilateral tubal ligation Bilateral 08/29/2014    Procedure: CESAREAN SECTION WITH BILATERAL TUBAL LIGATION;  Surgeon: Cheri Fowler, MD;  Location: [facility];  Service: Obstetrics;  Laterality: Bilateral;  MD requests RNFA [name] confirmed 7/29...tms   Family History  Problem Relation Age of Onset  . Depression Mother   . Mental illness Mother   . Diabetes Father   . Hypertension Father    . Hyperlipidemia Father   . Diabetes Maternal Grandmother     Dialysis  . Hypertension Maternal Grandmother   . Heart disease Maternal Grandmother   . Cancer Paternal Grandmother     Breast Cancer  . Cancer Other     colon  . Kidney failure Other     dialysis  . Cancer Other     colon  . Diabetes Brother    Social History  Substance Use Topics  . Smoking status: Former Smoker -- 0.50 packs/day    Types: Cigarettes    Quit date: 01/09/2014  . Smokeless tobacco: Never Used  . Alcohol Use: No   OB History    Gravida Para Term Preterm AB TAB SAB Ectopic Multiple Living   3 2 2  1  1   0 2     Review of Systems  All other systems reviewed and are negative.   Allergies  Percocet and Tramadol  Home Medications   Prior to Admission medications   Medication Sig Start Date End Date Taking? Authorizing Provider  acetaminophen (TYLENOL) 500 MG tablet Take 1,500-2,000 mg by mouth every 6 (six) hours as needed for headache.    Yes Historical Provider, MD  omeprazole (PRILOSEC) 20 MG capsule Take 20 mg  by mouth daily as needed (heartburn). Reported on 02/17/2015   Yes Historical Provider, MD  diclofenac sodium (VOLTAREN) 1 % GEL Apply 2 g topically 4 (four) times daily. Patient not taking: Reported on 02/17/2015 01/08/15   Shawn C Joy, PA-C  HYDROmorphone (DILAUDID) 2 MG tablet Take 1 tablet (2 mg total) by mouth every 3 (three) hours as needed for severe pain. Patient not taking: Reported on 02/17/2015 09/01/14   Cheri Fowler, MD  ibuprofen (ADVIL,MOTRIN) 600 MG tablet Take 1 tablet (600 mg total) by mouth every 6 (six) hours. Patient not taking: Reported on 02/17/2015 09/01/14   Cheri Fowler, MD  ibuprofen (ADVIL,MOTRIN) 800 MG tablet Take 1 tablet (800 mg total) by mouth 3 (three) times daily. Patient not taking: Reported on 02/17/2015 01/08/15   Shawn C Joy, PA-C  lidocaine (LIDODERM) 5 % Place 1 patch onto the skin daily. Remove & Discard patch within 12 hours or as directed by  MD Patient not taking: Reported on 02/17/2015 01/08/15   Shawn C Joy, PA-C  methocarbamol (ROBAXIN) 500 MG tablet Take 1 tablet (500 mg total) by mouth 2 (two) times daily. Patient not taking: Reported on 02/17/2015 01/08/15   Helane Gunther Joy, PA-C  metoCLOPramide (REGLAN) 10 MG tablet Take 1 tablet (10 mg total) by mouth every 6 (six) hours as needed for nausea. Patient not taking: Reported on 02/17/2015 02/10/14   Lattie Haw A Leftwich-Kirby, CNM   BP 125/83 mmHg  Pulse 77  Temp(Src) 98.2 F (36.8 C) (Oral)  Resp 18  SpO2 97%  LMP 02/09/2015 Physical Exam  Constitutional: She is oriented to person, place, and time. She appears well-developed and well-nourished.  HENT:  Head: Normocephalic and atraumatic.  Mouth/Throat: Uvula is midline, oropharynx is clear and moist and mucous membranes are normal. No oropharyngeal exudate, posterior oropharyngeal edema, posterior oropharyngeal erythema or tonsillar abscesses.  Tonsils absent bilateral. Uvula midline and rises phonation. No signs of peritonsillar post pharyngeal abscess. No ulcerations or rash to the posterior pharynx.  Eyes: Conjunctivae are normal. Pupils are equal, round, and reactive to light. Right eye exhibits no discharge. Left eye exhibits no discharge. No scleral icterus.  Neck: Trachea normal, normal range of motion and full passive range of motion without pain. Neck supple. No JVD present. No tracheal tenderness, no spinous process tenderness and no muscular tenderness present. Carotid bruit is not present. No rigidity. No tracheal deviation, no edema, no erythema and normal range of motion present. No thyroid mass and no thyromegaly present.  Pulmonary/Chest: Effort normal. No stridor.  Neurological: She is alert and oriented to person, place, and time. Coordination normal.  Psychiatric: She has a normal mood and affect. Her behavior is normal. Judgment and thought content normal.  Nursing note and vitals reviewed.     ED Course   Procedures (including critical care time) Labs Review Labs Reviewed - No data to display  Imaging Review No results found. I have personally reviewed and evaluated these images and lab results as part of my medical decision-making.   EKG Interpretation None      MDM   Final diagnoses:  Foreign body sensation in throat    Labs:  Imaging:  Consults:  Therapeutics:  Discharge Meds:   Assessment/Plan: Patient presents with a foreign body sensation in her throat. Physical exam shows no abnormal findings, I'm unable to view any deeper than the posterior oropharynx. She has no abnormal findings on her physical exam, she has no signs of infectious etiology, malignant etiology, or any  other acutely concerning signs or symptoms at this time. Patient is a smoker with concern for malignancy, she will be referred to ENT for further evaluation and management. Instructed contact them first thing Monday morning to schedule follow-up evaluation. She is given strict return precautions, verbalized understanding and agreement with this plan.         Okey Regal, PA-C 02/17/15 1517  Tanna Furry, MD 02/21/15 [phone]

## 2015-02-26 ENCOUNTER — Other Ambulatory Visit: Payer: Self-pay | Admitting: Family Medicine

## 2015-02-26 DIAGNOSIS — R109 Unspecified abdominal pain: Secondary | ICD-10-CM

## 2015-03-01 ENCOUNTER — Emergency Department (HOSPITAL_COMMUNITY)
Admission: EM | Admit: 2015-03-01 | Discharge: 2015-03-01 | Disposition: A | Discharge status: Home / Self Care | Payer: Medicaid Other | Attending: Emergency Medicine | Admitting: Emergency Medicine

## 2015-03-01 ENCOUNTER — Encounter (HOSPITAL_COMMUNITY): Payer: Self-pay

## 2015-03-01 DIAGNOSIS — R197 Diarrhea, unspecified: Secondary | ICD-10-CM | POA: Diagnosis not present

## 2015-03-01 DIAGNOSIS — R1013 Epigastric pain: Secondary | ICD-10-CM | POA: Insufficient documentation

## 2015-03-01 LAB — I-STAT BETA HCG BLOOD, ED (MC, WL, AP ONLY)

## 2015-03-01 LAB — COMPREHENSIVE METABOLIC PANEL
ALT: 12 U/L — AB (ref 14–54)
AST: 14 U/L — AB (ref 15–41)
Albumin: 4 g/dL (ref 3.5–5.0)
Alkaline Phosphatase: 55 U/L (ref 38–126)
Anion gap: 6 (ref 5–15)
BILIRUBIN TOTAL: 0.4 mg/dL (ref 0.3–1.2)
BUN: 9 mg/dL (ref 6–20)
CALCIUM: 8.6 mg/dL — AB (ref 8.9–10.3)
CO2: 27 mmol/L (ref 22–32)
CREATININE: 0.66 mg/dL (ref 0.44–1.00)
Chloride: 105 mmol/L (ref 101–111)
Glucose, Bld: 84 mg/dL (ref 65–99)
Potassium: 3.7 mmol/L (ref 3.5–5.1)
Sodium: 138 mmol/L (ref 135–145)
TOTAL PROTEIN: 7.1 g/dL (ref 6.5–8.1)

## 2015-03-01 LAB — CBC
HCT: 41.4 % (ref 36.0–46.0)
Hemoglobin: 13 g/dL (ref 12.0–15.0)
MCH: 25 pg — ABNORMAL LOW (ref 26.0–34.0)
MCHC: 31.4 g/dL (ref 30.0–36.0)
MCV: 79.8 fL (ref 78.0–100.0)
PLATELETS: 250 10*3/uL (ref 150–400)
RBC: 5.19 MIL/uL — AB (ref 3.87–5.11)
RDW: 15.7 % — AB (ref 11.5–15.5)
WBC: 8 10*3/uL (ref 4.0–10.5)

## 2015-03-01 LAB — LIPASE, BLOOD: Lipase: 24 U/L (ref 11–51)

## 2015-03-01 NOTE — ED Notes (Signed)
Pt came to desk and notified staff that she was not waiting any longer.

## 2015-03-01 NOTE — ED Notes (Signed)
Pt with abdominal pain/epigastric pain and diarrhea starting x months.  Hx of gallstones not removed.  Supposed to have ultrasound soon.

## 2015-03-05 ENCOUNTER — Ambulatory Visit
Admission: RE | Admit: 2015-03-05 | Discharge: 2015-03-05 | Disposition: A | Discharge status: Home / Self Care | Payer: Medicaid Other | Source: Ambulatory Visit | Attending: Family Medicine | Admitting: Family Medicine

## 2015-03-05 DIAGNOSIS — R109 Unspecified abdominal pain: Secondary | ICD-10-CM

## 2015-03-08 ENCOUNTER — Other Ambulatory Visit: Payer: Medicaid Other

## 2015-03-13 ENCOUNTER — Ambulatory Visit: Payer: Self-pay | Admitting: General Surgery

## 2015-03-23 ENCOUNTER — Encounter (HOSPITAL_COMMUNITY): Payer: Self-pay | Admitting: *Deleted

## 2015-03-26 ENCOUNTER — Ambulatory Visit (HOSPITAL_COMMUNITY): Payer: Medicaid Other | Admitting: Registered Nurse

## 2015-03-26 ENCOUNTER — Ambulatory Visit (HOSPITAL_COMMUNITY)
Admission: RE | Admit: 2015-03-26 | Discharge: 2015-03-26 | Disposition: A | Discharge status: Home / Self Care | Payer: Medicaid Other | Source: Ambulatory Visit | Attending: General Surgery | Admitting: General Surgery

## 2015-03-26 ENCOUNTER — Encounter (HOSPITAL_COMMUNITY)
Admission: RE | Disposition: A | Discharge status: Home / Self Care | Payer: Self-pay | Source: Ambulatory Visit | Attending: General Surgery

## 2015-03-26 ENCOUNTER — Encounter (HOSPITAL_COMMUNITY): Payer: Self-pay | Admitting: *Deleted

## 2015-03-26 DIAGNOSIS — K8010 Calculus of gallbladder with chronic cholecystitis without obstruction: Secondary | ICD-10-CM | POA: Insufficient documentation

## 2015-03-26 DIAGNOSIS — Z6841 Body Mass Index (BMI) 40.0 and over, adult: Secondary | ICD-10-CM | POA: Diagnosis not present

## 2015-03-26 DIAGNOSIS — K219 Gastro-esophageal reflux disease without esophagitis: Secondary | ICD-10-CM | POA: Insufficient documentation

## 2015-03-26 DIAGNOSIS — F17200 Nicotine dependence, unspecified, uncomplicated: Secondary | ICD-10-CM | POA: Insufficient documentation

## 2015-03-26 DIAGNOSIS — Z791 Long term (current) use of non-steroidal anti-inflammatories (NSAID): Secondary | ICD-10-CM | POA: Diagnosis not present

## 2015-03-26 DIAGNOSIS — M1990 Unspecified osteoarthritis, unspecified site: Secondary | ICD-10-CM | POA: Insufficient documentation

## 2015-03-26 DIAGNOSIS — Z79899 Other long term (current) drug therapy: Secondary | ICD-10-CM | POA: Insufficient documentation

## 2015-03-26 DIAGNOSIS — K8020 Calculus of gallbladder without cholecystitis without obstruction: Secondary | ICD-10-CM | POA: Diagnosis present

## 2015-03-26 HISTORY — PX: CHOLECYSTECTOMY: SHX55

## 2015-03-26 LAB — CBC WITH DIFFERENTIAL/PLATELET
Basophils Absolute: 0 10*3/uL (ref 0.0–0.1)
Basophils Relative: 0 %
EOS ABS: 0 10*3/uL (ref 0.0–0.7)
EOS PCT: 1 %
HCT: 40.6 % (ref 36.0–46.0)
Hemoglobin: 12.7 g/dL (ref 12.0–15.0)
LYMPHS ABS: 1.7 10*3/uL (ref 0.7–4.0)
LYMPHS PCT: 28 %
MCH: 25.2 pg — AB (ref 26.0–34.0)
MCHC: 31.3 g/dL (ref 30.0–36.0)
MCV: 80.7 fL (ref 78.0–100.0)
MONO ABS: 0.3 10*3/uL (ref 0.1–1.0)
Monocytes Relative: 5 %
Neutro Abs: 4 10*3/uL (ref 1.7–7.7)
Neutrophils Relative %: 66 %
PLATELETS: 201 10*3/uL (ref 150–400)
RBC: 5.03 MIL/uL (ref 3.87–5.11)
RDW: 16 % — AB (ref 11.5–15.5)
WBC: 6.1 10*3/uL (ref 4.0–10.5)

## 2015-03-26 LAB — COMPREHENSIVE METABOLIC PANEL
ALT: 13 U/L — AB (ref 14–54)
ANION GAP: 8 (ref 5–15)
AST: 16 U/L (ref 15–41)
Albumin: 3.7 g/dL (ref 3.5–5.0)
Alkaline Phosphatase: 45 U/L (ref 38–126)
BUN: 12 mg/dL (ref 6–20)
CHLORIDE: 106 mmol/L (ref 101–111)
CO2: 25 mmol/L (ref 22–32)
Calcium: 8.7 mg/dL — ABNORMAL LOW (ref 8.9–10.3)
Creatinine, Ser: 0.63 mg/dL (ref 0.44–1.00)
Glucose, Bld: 106 mg/dL — ABNORMAL HIGH (ref 65–99)
POTASSIUM: 3.7 mmol/L (ref 3.5–5.1)
SODIUM: 139 mmol/L (ref 135–145)
Total Bilirubin: 0.2 mg/dL — ABNORMAL LOW (ref 0.3–1.2)
Total Protein: 6.7 g/dL (ref 6.5–8.1)

## 2015-03-26 LAB — HCG, SERUM, QUALITATIVE: Preg, Serum: NEGATIVE

## 2015-03-26 SURGERY — LAPAROSCOPIC CHOLECYSTECTOMY
Anesthesia: General | Site: Abdomen

## 2015-03-26 MED ORDER — NEOSTIGMINE METHYLSULFATE 10 MG/10ML IV SOLN
INTRAVENOUS | Status: DC | PRN
  Administered 2015-03-26: 5 mg via INTRAVENOUS

## 2015-03-26 MED ORDER — LIDOCAINE HCL (CARDIAC) 20 MG/ML IV SOLN
INTRAVENOUS | Status: DC | PRN
  Administered 2015-03-26: 100 mg via INTRAVENOUS

## 2015-03-26 MED ORDER — HYDROMORPHONE HCL 1 MG/ML IJ SOLN
INTRAMUSCULAR | Status: AC
  Filled 2015-03-26: qty 1

## 2015-03-26 MED ORDER — LACTATED RINGERS IR SOLN
Status: DC | PRN
  Administered 2015-03-26: 1000 mL

## 2015-03-26 MED ORDER — ROCURONIUM BROMIDE 100 MG/10ML IV SOLN
INTRAVENOUS | Status: AC
  Filled 2015-03-26: qty 1

## 2015-03-26 MED ORDER — MIDAZOLAM HCL 5 MG/5ML IJ SOLN
INTRAMUSCULAR | Status: DC | PRN
  Administered 2015-03-26: 2 mg via INTRAVENOUS

## 2015-03-26 MED ORDER — KETOROLAC TROMETHAMINE 30 MG/ML IJ SOLN
30.0000 mg | Freq: Once | INTRAMUSCULAR | Status: AC
  Administered 2015-03-26: 30 mg via INTRAVENOUS

## 2015-03-26 MED ORDER — SUCCINYLCHOLINE CHLORIDE 20 MG/ML IJ SOLN
INTRAMUSCULAR | Status: DC | PRN
  Administered 2015-03-26: 100 mg via INTRAVENOUS

## 2015-03-26 MED ORDER — PROPOFOL 10 MG/ML IV BOLUS
INTRAVENOUS | Status: DC | PRN
Start: 2015-03-26 — End: 2015-03-26
  Administered 2015-03-26: 250 mg via INTRAVENOUS

## 2015-03-26 MED ORDER — FENTANYL CITRATE (PF) 250 MCG/5ML IJ SOLN
INTRAMUSCULAR | Status: AC
  Filled 2015-03-26: qty 5

## 2015-03-26 MED ORDER — SCOPOLAMINE 1 MG/3DAYS TD PT72
MEDICATED_PATCH | TRANSDERMAL | Status: DC | PRN
  Administered 2015-03-26: 1 via TRANSDERMAL

## 2015-03-26 MED ORDER — ROCURONIUM BROMIDE 100 MG/10ML IV SOLN
INTRAVENOUS | Status: DC | PRN
  Administered 2015-03-26: 40 mg via INTRAVENOUS

## 2015-03-26 MED ORDER — DEXAMETHASONE SODIUM PHOSPHATE 10 MG/ML IJ SOLN
INTRAMUSCULAR | Status: DC | PRN
  Administered 2015-03-26: 10 mg via INTRAVENOUS

## 2015-03-26 MED ORDER — ONDANSETRON HCL 4 MG/2ML IJ SOLN
INTRAMUSCULAR | Status: DC | PRN
  Administered 2015-03-26: 4 mg via INTRAVENOUS

## 2015-03-26 MED ORDER — LACTATED RINGERS IV SOLN
INTRAVENOUS | Status: DC | PRN
  Administered 2015-03-26: 14:00:00 via INTRAVENOUS

## 2015-03-26 MED ORDER — DEXAMETHASONE SODIUM PHOSPHATE 10 MG/ML IJ SOLN
INTRAMUSCULAR | Status: AC
  Filled 2015-03-26: qty 1

## 2015-03-26 MED ORDER — LACTATED RINGERS IV SOLN: INTRAVENOUS | Status: DC

## 2015-03-26 MED ORDER — FENTANYL CITRATE (PF) 100 MCG/2ML IJ SOLN
INTRAMUSCULAR | Status: AC
  Filled 2015-03-26: qty 2

## 2015-03-26 MED ORDER — IBUPROFEN 600 MG PO TABS: 600.0000 mg | ORAL_TABLET | Freq: Four times a day (QID) | ORAL | Status: DC | PRN

## 2015-03-26 MED ORDER — PROPOFOL 10 MG/ML IV BOLUS
INTRAVENOUS | Status: AC
  Filled 2015-03-26: qty 20

## 2015-03-26 MED ORDER — HYDROCODONE-ACETAMINOPHEN 5-325 MG PO TABS
1.0000 | ORAL_TABLET | Freq: Once | ORAL | Status: AC
  Administered 2015-03-26: 1 via ORAL
  Filled 2015-03-26: qty 1

## 2015-03-26 MED ORDER — HEPARIN SODIUM (PORCINE) 5000 UNIT/ML IJ SOLN
5000.0000 [IU] | Freq: Once | INTRAMUSCULAR | Status: AC
  Administered 2015-03-26: 5000 [IU] via SUBCUTANEOUS
  Filled 2015-03-26: qty 1

## 2015-03-26 MED ORDER — HYDROCODONE-ACETAMINOPHEN 5-325 MG PO TABS: 1.0000 | ORAL_TABLET | ORAL | Status: DC | PRN

## 2015-03-26 MED ORDER — GLYCOPYRROLATE 0.2 MG/ML IJ SOLN
INTRAMUSCULAR | Status: DC | PRN
  Administered 2015-03-26: .8 mg via INTRAVENOUS

## 2015-03-26 MED ORDER — BUPIVACAINE-EPINEPHRINE 0.25% -1:200000 IJ SOLN
INTRAMUSCULAR | Status: DC | PRN
  Administered 2015-03-26: 40 mL

## 2015-03-26 MED ORDER — ONDANSETRON HCL 4 MG/2ML IJ SOLN
INTRAMUSCULAR | Status: AC
  Filled 2015-03-26: qty 2

## 2015-03-26 MED ORDER — MIDAZOLAM HCL 2 MG/2ML IJ SOLN
INTRAMUSCULAR | Status: AC
  Filled 2015-03-26: qty 2

## 2015-03-26 MED ORDER — GLYCOPYRROLATE 0.2 MG/ML IJ SOLN
INTRAMUSCULAR | Status: AC
  Filled 2015-03-26: qty 4

## 2015-03-26 MED ORDER — BUPIVACAINE-EPINEPHRINE 0.25% -1:200000 IJ SOLN
INTRAMUSCULAR | Status: AC
  Filled 2015-03-26: qty 1

## 2015-03-26 MED ORDER — HYDROMORPHONE HCL 1 MG/ML IJ SOLN
0.2500 mg | INTRAMUSCULAR | Status: DC | PRN
  Administered 2015-03-26 (×4): 0.5 mg via INTRAVENOUS

## 2015-03-26 MED ORDER — LIDOCAINE HCL (CARDIAC) 20 MG/ML IV SOLN
INTRAVENOUS | Status: AC
  Filled 2015-03-26: qty 5

## 2015-03-26 MED ORDER — CHLORHEXIDINE GLUCONATE 4 % EX LIQD: 1.0000 "application " | Freq: Once | CUTANEOUS | Status: DC

## 2015-03-26 MED ORDER — LORAZEPAM 2 MG/ML IJ SOLN
1.0000 mg | INTRAMUSCULAR | Status: DC | PRN
  Filled 2015-03-26: qty 0.5

## 2015-03-26 MED ORDER — KETOROLAC TROMETHAMINE 30 MG/ML IJ SOLN
INTRAMUSCULAR | Status: AC
  Filled 2015-03-26: qty 1

## 2015-03-26 MED ORDER — FENTANYL CITRATE (PF) 100 MCG/2ML IJ SOLN
INTRAMUSCULAR | Status: DC | PRN
  Administered 2015-03-26 (×7): 50 ug via INTRAVENOUS

## 2015-03-26 MED ORDER — 0.9 % SODIUM CHLORIDE (POUR BTL) OPTIME
TOPICAL | Status: DC | PRN
  Administered 2015-03-26: 1000 mL

## 2015-03-26 MED ORDER — CEFOTETAN DISODIUM-DEXTROSE 2-2.08 GM-% IV SOLR
INTRAVENOUS | Status: AC
  Filled 2015-03-26: qty 50

## 2015-03-26 MED ORDER — PROMETHAZINE HCL 25 MG/ML IJ SOLN
6.2500 mg | INTRAMUSCULAR | Status: AC | PRN
  Administered 2015-03-26 (×2): 6.25 mg via INTRAVENOUS

## 2015-03-26 MED ORDER — PROMETHAZINE HCL 25 MG/ML IJ SOLN
INTRAMUSCULAR | Status: AC
  Filled 2015-03-26: qty 1

## 2015-03-26 MED ORDER — CEFOTETAN DISODIUM-DEXTROSE 2-2.08 GM-% IV SOLR
2.0000 g | INTRAVENOUS | Status: AC
  Administered 2015-03-26: 2 g via INTRAVENOUS

## 2015-03-26 SURGICAL SUPPLY — 45 items
APPLIER CLIP ROT 10 11.4 M/L (STAPLE)
APR CLP MED LRG 11.4X10 (STAPLE)
BAG SPEC RTRVL 10 TROC 200 (ENDOMECHANICALS) ×1
CABLE HIGH FREQUENCY MONO STRZ (ELECTRODE) ×3 IMPLANT
CATH CHOLANG 76X19 KUMAR (CATHETERS) ×1 IMPLANT
CHLORAPREP W/TINT 26ML (MISCELLANEOUS) ×3 IMPLANT
CLIP APPLIE ROT 10 11.4 M/L (STAPLE) IMPLANT
CLIP LIGATING HEM O LOK PURPLE (MISCELLANEOUS) ×6 IMPLANT
CLIP LIGATING HEMO LOK XL GOLD (MISCELLANEOUS) IMPLANT
COVER MAYO STAND STRL (DRAPES) IMPLANT
COVER SURGICAL LIGHT HANDLE (MISCELLANEOUS) ×1 IMPLANT
CUTTER FLEX LINEAR 45M (STAPLE) IMPLANT
DECANTER SPIKE VIAL GLASS SM (MISCELLANEOUS) ×3 IMPLANT
DEVICE PMI PUNCTURE CLOSURE (MISCELLANEOUS) ×3 IMPLANT
DRAIN CHANNEL 19F RND (DRAIN) IMPLANT
DRAPE C-ARM 42X120 X-RAY (DRAPES) IMPLANT
DRAPE LAPAROSCOPIC ABDOMINAL (DRAPES) ×3 IMPLANT
EVACUATOR SILICONE 100CC (DRAIN) IMPLANT
GLOVE BIOGEL PI IND STRL 7.0 (GLOVE) ×1 IMPLANT
GLOVE BIOGEL PI INDICATOR 7.0 (GLOVE) ×2
GLOVE SURG SS PI 7.0 STRL IVOR (GLOVE) ×3 IMPLANT
GOWN STRL REUS W/TWL LRG LVL3 (GOWN DISPOSABLE) ×5 IMPLANT
GOWN STRL REUS W/TWL XL LVL3 (GOWN DISPOSABLE) ×6 IMPLANT
KIT BASIN OR (CUSTOM PROCEDURE TRAY) ×3 IMPLANT
LIQUID BAND (GAUZE/BANDAGES/DRESSINGS) ×3 IMPLANT
PAD POSITIONING PINK XL (MISCELLANEOUS) ×2 IMPLANT
POUCH RETRIEVAL ECOSAC 10 (ENDOMECHANICALS) ×1 IMPLANT
POUCH RETRIEVAL ECOSAC 10MM (ENDOMECHANICALS) ×2
RELOAD 45 VASCULAR/THIN (ENDOMECHANICALS) IMPLANT
RELOAD STAPLE 45 2.5 WHT GRN (ENDOMECHANICALS) IMPLANT
RELOAD STAPLE 45 3.5 BLU ETS (ENDOMECHANICALS) IMPLANT
RELOAD STAPLE TA45 3.5 REG BLU (ENDOMECHANICALS) IMPLANT
SCISSORS LAP 5X35 DISP (ENDOMECHANICALS) ×3 IMPLANT
SET IRRIG TUBING LAPAROSCOPIC (IRRIGATION / IRRIGATOR) IMPLANT
SHEARS HARMONIC ACE PLUS 36CM (ENDOMECHANICALS) IMPLANT
SLEEVE XCEL OPT CAN 5 100 (ENDOMECHANICALS) ×6 IMPLANT
STOPCOCK 4 WAY LG BORE MALE ST (IV SETS) ×3 IMPLANT
SUT ETHILON 2 0 PS N (SUTURE) IMPLANT
SUT MNCRL AB 4-0 PS2 18 (SUTURE) ×3 IMPLANT
SUT VICRYL 0 ENDOLOOP (SUTURE) IMPLANT
TOWEL OR 17X26 10 PK STRL BLUE (TOWEL DISPOSABLE) ×3 IMPLANT
TOWEL OR NON WOVEN STRL DISP B (DISPOSABLE) IMPLANT
TRAY LAPAROSCOPIC (CUSTOM PROCEDURE TRAY) ×3 IMPLANT
TROCAR BLADELESS OPT 5 100 (ENDOMECHANICALS) ×3 IMPLANT
TROCAR XCEL 12X100 BLDLESS (ENDOMECHANICALS) ×3 IMPLANT

## 2015-03-26 NOTE — Anesthesia Postprocedure Evaluation (Signed)
Anesthesia Post Note  Patient: Tina [name]  Procedure(s) Performed: Procedure(s) (LRB): LAPAROSCOPIC CHOLECYSTECTOMY (N/A)  Patient location during evaluation: PACU Anesthesia Type: General Level of consciousness: awake and alert Pain management: pain level controlled Vital Signs Assessment: post-procedure vital signs reviewed and stable Respiratory status: spontaneous breathing, nonlabored ventilation, respiratory function stable and patient connected to nasal cannula oxygen Cardiovascular status: blood pressure returned to baseline and stable Postop Assessment: no signs of nausea or vomiting Anesthetic complications: no    Last Vitals:  Filed Vitals:   03/26/15 1538 03/26/15 1545  BP:  126/73  Pulse: 63 64  Temp:    Resp: 12 10    Last Pain:  Filed Vitals:   03/26/15 1549  PainSc: 10-Worst pain ever                 Zenaida Deed

## 2015-03-26 NOTE — Anesthesia Preprocedure Evaluation (Addendum)
Anesthesia Evaluation  Patient identified by MRN, date of birth, ID band Patient awake    Reviewed: Allergy & Precautions, H&P , NPO status , Patient's Chart, lab work & pertinent test results  History of Anesthesia Complications (+) PONV and history of anesthetic complications  Airway Mallampati: I  TM Distance: >3 FB Neck ROM: full    Dental no notable dental hx. (+) Teeth Intact   Pulmonary Current Smoker,    Pulmonary exam normal breath sounds clear to auscultation       Cardiovascular negative cardio ROS Normal cardiovascular exam Rhythm:regular Rate:Normal     Neuro/Psych Anxiety Depression negative neurological ROS     GI/Hepatic Neg liver ROS, GERD  ,  Endo/Other  diabetesMorbid obesity  Renal/GU negative Renal ROS     Musculoskeletal  (+) Arthritis ,   Abdominal   Peds  Hematology   Anesthesia Other Findings   Reproductive/Obstetrics negative OB ROS                           Anesthesia Physical Anesthesia Plan  ASA: III  Anesthesia Plan: General   Post-op Pain Management:    Induction: Intravenous  Airway Management Planned: Oral ETT  Additional Equipment:   Intra-op Plan:   Post-operative Plan: Extubation in OR  Informed Consent: I have reviewed the patients History and Physical, chart, labs and discussed the procedure including the risks, benefits and alternatives for the proposed anesthesia with the patient or authorized representative who has indicated his/her understanding and acceptance.   Dental Advisory Given  Plan Discussed with: Anesthesiologist, CRNA and Surgeon  Anesthesia Plan Comments:        Anesthesia Quick Evaluation

## 2015-03-26 NOTE — H&P (Signed)
History of Present Illness Patient words: New-Gallbladder.  The patient is a 35 year old female who presents for evaluation of gall stones. Referred by Dr. Darron Doom. She has had 4 months of right upper quadrant bowel pain initially was intermittent happening one every 3 days. Currently symptoms are daily. Pain is located in right upper quadrant. Occurs after meals. She's been to the ER 3 times for this pain. She is taking over-the-counter pain medications without improvement. She has also increased the amount of over-the-counter antacid that she takes without improvement. She eats 3 meals per day her morning meal is most commonly associated with pain for which she has eggs and cheese. She is smoking less than a pack per day.  Besides the right upper quadrant pain, she also complains of epigastric and chest pain consistent with heartburn. She has had heartburn especially during pregnancy. She is 6 months postpartum with her second child. Both by C-section.  Other Problems Anxiety Disorder Arthritis Back Pain Cholelithiasis Depression Gastroesophageal Reflux Disease Migraine Headache  Past Surgical History  Cesarean Section - Multiple Oral Surgery Tonsillectomy  Diagnostic Studies History  Colonoscopy never Mammogram never Pap Smear 1-5 years ago  Allergies  Percocet *ANALGESICS - OPIOID* TraMADol HCl *ANALGESICS - OPIOID*  Medication History  Voltaren (1% Gel, Transdermal) Active. Ibuprofen (600MG  Tablet, Oral) Active. Reglan (10MG  Tablet, Oral) Active. Omeprazole (20MG  Capsule DR, Oral) Active. ClonazePAM (0.5MG  Tablet, Oral) Active. Depakote (500MG  Tablet DR, Oral) Active. Medications Reconciled  Social History Caffeine use Carbonated beverages. No alcohol use No drug use Tobacco use Current some day smoker.  Family History  Arthritis Father, Mother. Breast Cancer Family Members In General. Cancer Family Members In  General. Cervical Cancer Family Members In General. Colon Cancer Family Members In General. Colon Polyps Family Members In General, Father. Depression Mother. Diabetes Mellitus Father. Heart Disease Family Members In General. Heart disease in female family member before age 4 Kidney Disease Brother, Family Members In General. Migraine Headache Family Members In General, Mother. Thyroid problems Family Members In General.  Pregnancy / Birth History  Age at menarche 36 years. Gravida 3 Maternal age 57-20 Para 2 Regular periods  Review of Systems General Present- Weight Gain. Not Present- Appetite Loss, Chills, Fatigue, Fever, Night Sweats and Weight Loss. Skin Not Present- Change in Wart/Mole, Dryness, Hives, Jaundice, New Lesions, Non-Healing Wounds, Rash and Ulcer. HEENT Present- Sore Throat and Wears glasses/contact lenses. Not Present- Earache, Hearing Loss, Hoarseness, Nose Bleed, Oral Ulcers, Ringing in the Ears, Seasonal Allergies, Sinus Pain, Visual Disturbances and Yellow Eyes. Respiratory Not Present- Bloody sputum, Chronic Cough, Difficulty Breathing, Snoring and Wheezing. Breast Not Present- Breast Mass, Breast Pain, Nipple Discharge and Skin Changes. Cardiovascular Present- Chest Pain. Not Present- Difficulty Breathing Lying Down, Leg Cramps, Palpitations, Rapid Heart Rate, Shortness of Breath and Swelling of Extremities. Gastrointestinal Present- Abdominal Pain, Excessive gas, Indigestion and Nausea. Not Present- Bloating, Bloody Stool, Change in Bowel Habits, Chronic diarrhea, Constipation, Difficulty Swallowing, Gets full quickly at meals, Hemorrhoids, Rectal Pain and Vomiting. Female Genitourinary Not Present- Frequency, Nocturia, Painful Urination, Pelvic Pain and Urgency. Musculoskeletal Present- Back Pain. Not Present- Joint Pain, Joint Stiffness, Muscle Pain, Muscle Weakness and Swelling of Extremities. Neurological Present- Headaches. Not Present-  Decreased Memory, Fainting, Numbness, Seizures, Tingling, Tremor, Trouble walking and Weakness. Psychiatric Present- Anxiety and Depression. Not Present- Bipolar, Change in Sleep Pattern, Fearful and Frequent crying. Endocrine Not Present- Cold Intolerance, Excessive Hunger, Hair Changes, Heat Intolerance, Hot flashes and New Diabetes. Hematology Not Present- Easy Bruising, Excessive  bleeding, Gland problems, HIV and Persistent Infections.  BP 138/85 mmHg  Pulse 90  Temp(Src) 98.6 F (37 C) (Oral)  Resp 19  Ht 5\' 7"  (1.702 m)  Wt 124.456 kg (274 lb 6 oz)  BMI 42.96 kg/m2  SpO2 99%  LMP 02/28/2015 (Approximate)  Breastfeeding? No  Physical Exam General Mental Status-Alert. General Appearance-Cooperative. Orientation-Oriented X4. Posture-Normal posture.  Integumentary Global Assessment Normal Exam - Head/Face: no rashes, ulcers, lesions or evidence of photo damage. No palpable nodules or masses and Neck: no visible lesions or palpable masses.  Head and Neck Head-normocephalic, atraumatic with no lesions or palpable masses. Face Global Assessment - atraumatic. Thyroid Gland Characteristics - normal size and consistency.  Eye Eyeball - Bilateral-Extraocular movements intact. Sclera/Conjunctiva - Bilateral-No scleral icterus, No Discharge.  ENMT Nose and Sinuses Nose - no deformities observed, no swelling present.  Chest and Lung Exam Palpation Normal exam - Non-tender. Auscultation Breath sounds - Normal.  Cardiovascular Auscultation Rhythm - Regular. Heart Sounds - S1 WNL and S2 WNL. Carotid arteries - No Carotid bruit.  Abdomen Inspection Normal Exam - No Visible peristalsis, No Abnormal pulsations and No Paradoxical movements. Palpation/Percussion Normal exam - Soft, No Rebound tenderness, No Rigidity (guarding), No hepatosplenomegaly and No Palpable abdominal masses. Tenderness - Right Upper Quadrant. Gallbladder - Negative Murphy's  sign.  Peripheral Vascular Upper Extremity Palpation - Pulses bilaterally normal. Lower Extremity Palpation - Edema - Bilateral - No edema.  Neurologic Neurologic evaluation reveals -normal sensation and normal coordination.  Neuropsychiatric Mental status exam performed with findings of-able to articulate well with normal speech/language, rate, volume and coherence and thought content normal with ability to perform basic computations and apply abstract reasoning.  Musculoskeletal Normal Exam - Bilateral-Upper Extremity Strength Normal and Lower Extremity Strength Normal.  Assessment & Plan  GALL BLADDER STONES (K80.20) Impression: 1 cm stones seen on ultrasound. Exam consistent with symptomatic cholelithiasis. We discussed the etiology of gallstones that pain is generally related to ingestion of fat. Her symptoms are quite classic for this. I also discussed that this would not improve her heartburn related pain. I have encouraged her to stop the over-the-counter ranitidine and instead start the prescribed pantoprazole. I have also prescribed ibuprofen for pain control in the meantime. We discussed the details of the procedure the regimen was performed laparoscopically that the main risks of the procedure are serially injury, cystic duct leak, injury to liver, bleeding, infection, need for open procedure, postcholecystectomy syndrome and hernia. She should agreement and like to undergo surgery as soon as possible. Current Plans The anatomy & physiology of hepatobiliary & pancreatic function was discussed. The pathophysiology of gallbladder dysfunction was discussed. Natural history risks without surgery was discussed. I feel the risks of no intervention will lead to serious problems that outweigh the operative risks; therefore, I recommended cholecystectomy to remove the pathology. I explained laparoscopic techniques with possible need for an open approach. Probable cholangiogram to  evaluate the bilary tract was explained as well.  Risks such as bleeding, infection, abscess, leak, injury to other organs, need for further treatment, heart attack, death, and other risks were discussed. I noted a good likelihood this will help address the problem. Possibility that this will not correct all abdominal symptoms was explained. Goals of post-operative recovery were discussed as well. We will work to minimize complications. An educational handout further explaining the pathology and treatment options was given as well. Questions were answered. The patient expresses understanding & wishes to proceed with surgery.  Pt Education -  Laparoscopic Cholecystectomy: gallbladder You are being scheduled for surgery - Our schedulers will call you.  You should hear from our office's scheduling department within 5 working days about the location, date, and time of surgery. We try to make accommodations for patient's preferences in scheduling surgery, but sometimes the OR schedule or the surgeon's schedule prevents [place] from making those accommodations.  If you have not heard from our office [phone]) in 5 working days, call the office and ask for your surgeon's nurse.  If you have other questions about your diagnosis, plan, or surgery, call the office and ask for your surgeon's nurse.  Started Protonix 40MG , 1 (one) Tablet two times daily, #180, 90 days starting 03/13/2015, Ref. x3. Started Ibuprofen 600MG , 1 (one) Tablet three times daily, as needed, #60, 03/13/2015, Ref. x1. GASTRO-ESOPHAGEAL REFLUX DISEASE WITH ESOPHAGITIS (K21.0) Impression: Persistent despite H2 blockers. -Prescribe PPI -Recommend nicotine cessation  Addendum: The PPI did help her reflux, but she continues to have right upper quadrant pain like attacks. No other changes to her health. The procedure was again reviewed and all questions answered.  Gurney Maxin, M.D. [facility] Pg: [phone]

## 2015-03-26 NOTE — Discharge Instructions (Signed)
Laparoscopic Cholecystectomy, Care After These instructions give you information about caring for yourself after your procedure. Your doctor may also give you more specific instructions. Call your doctor if you have any problems or questions after your procedure. HOME CARE Incision Care  Follow instructions from your doctor about how to take care of your cuts from surgery (incisions). Make sure you:  Wash your hands with soap and water before you change your bandage (dressing). If you cannot use soap and water, use hand sanitizer.  Change your bandage as told by your doctor.  Leave stitches (sutures), skin glue, or skin tape (adhesive) strips in place. They may need to stay in place for 2 weeks or longer. If tape strips get loose and curl up, you may trim the loose edges. Do not remove tape strips completely unless your doctor says it is okay.  Do not take baths, swim, or use a hot tub until your doctor says it is okay. Ask your doctor if you can take showers. You may only be allowed to take sponge baths. General Instructions  Take over-the-counter and prescription medicines only as told by your doctor.  Do not drive or use heavy machinery while taking prescription pain medicine.  Return to your normal diet as told by your doctor.  Do not lift anything that is heavier than 10 lb (4.5 kg).  Do not play contact sports for 1 week or until your doctor says it is okay. GET HELP IF:  You have redness, swelling, or pain at the site of your surgical cuts.  You have fluid, blood, or pus coming from your cuts.  You notice a bad smell coming from your cut area.  Your surgical cuts break open.  You have a fever. GET HELP RIGHT AWAY IF:   You have a rash.  You have trouble breathing.  You have chest pain.  You have pain in your shoulders (shoulder strap areas) that is getting worse.  You pass out (faint) or feel dizzy while you are standing.  You have very bad pain in your belly  (abdomen).  You feel sick to your stomach (nauseous) or throw up (vomit) for more than 1 day.   This information is not intended to replace advice given to you by your health care provider. Make sure you discuss any questions you have with your health care provider.   Document Released: 10/09/2007 Document Revised: 09/20/2014 Document Reviewed: 08/11/2012 Elsevier Interactive Patient Education 2016 [employer]  

## 2015-03-26 NOTE — Transfer of Care (Signed)
Immediate Anesthesia Transfer of Care Note  Patient: Tina [name]  Procedure(s) Performed: Procedure(s): LAPAROSCOPIC CHOLECYSTECTOMY (N/A)  Patient Location: PACU  Anesthesia Type:General  Level of Consciousness: awake, alert , oriented and patient cooperative  Airway & Oxygen Therapy: Patient Spontanous Breathing and Patient connected to face mask oxygen  Post-op Assessment: Report given to RN, Post -op Vital signs reviewed and stable and Patient moving all extremities  Post vital signs: Reviewed and stable  Last Vitals:  Filed Vitals:   03/26/15 1203  BP: 138/85  Pulse: 90  Temp: 37 C  Resp: 19    Complications: No apparent anesthesia complications

## 2015-03-26 NOTE — Op Note (Signed)
Preoperative diagnosis: symptomatic gallstones  Postoperative diagnosis: Same   Procedure: laparoscopic cholecystectomy  Surgeon: Gurney Maxin, M.D.  Asst: Excell Seltzer, M.D.  Anesthesia: Gen.   Indications for procedure: Tina [name] is a 35 y.o. female with symptoms of Abdominal pain, RUQ pain and Nausea consistent with gallbladder disease, Confirmed by Ultrasound.  Description of procedure: The patient was brought into the operative suite, placed supine. Anesthesia was administered with endotracheal tube. Patient was strapped in place and foot board was secured. All pressure points were offloaded by foam padding. The patient was prepped and draped in the usual sterile fashion.  A small incision was made to the right of the umbilicus. A 40mm trocar was inserted into the peritoneal cavity with optical entry. Pneumoperitoneum was applied with high flow low pressure. 2 59mm trocars were placed in the RUQ. A 48mm trocar was placed in the subxiphoid space. All trocars sites were first anesthesized with 0.25% marcaine with epinephrine in the subcutaneous and preperitoneal layers. Next the patient was placed in reverse trendelenberg. The gallbladder was retracted cephalad and lateral. The peritoneum was reflected off the infundibulum working lateral to medial.   The liver was long and had a small lobe lying directly over the infundibulum and cystic triangle. This made dissection difficult and one additional 15mm trocar was placed in the left upper quadrant to retract the liver better. Once this was complete, dissection and visibility were much improved.  The cystic duct and cystic artery were identified and further dissection revealed a critical view. The cystic duct and cystic artery were doubly clipped and ligated. One additional posterior artery branch was clipped and cauterized  The gallbladder was removed off the liver bed with cautery. The Gallbladder was placed in a specimen bag. The  gallbladder fossa was irrigated and hemostasis was applied with cautery. The gallbladder was removed via the 59mm trocar. The gallbladder had multiple large stones and the trocar site was dilated. Due to this the fascial defect was closed with interrupted 0 vicryl sutures using a suture passer. Pneumoperitoneum was removed, all trocar were removed. All incisions were closed with 4-0 monocryl subcuticular stitch. The patient woke from anesthesia and was brought to PACU in stable condition.  Findings: multiple large gallbladder stones. Accessory liver lobe overlying the cystic triangle  Specimen: gallbldadder  Blood loss: <48ml  Local anesthesia: 71ml 0.25% marcaine w epi  Complications: none  Gurney Maxin, M.D. General, Bariatric, & Minimally Invasive Surgery [facility]

## 2015-03-26 NOTE — Anesthesia Procedure Notes (Signed)
Procedure Name: Intubation Date/Time: 03/26/2015 2:05 PM Performed by: Carleene Cooper A Pre-anesthesia Checklist: Patient identified, Timeout performed, Emergency Drugs available, Suction available and Patient being monitored Patient Re-evaluated:Patient Re-evaluated prior to inductionOxygen Delivery Method: Circle system utilized Preoxygenation: Pre-oxygenation with 100% oxygen Intubation Type: IV induction Ventilation: Mask ventilation without difficulty Laryngoscope Size: Mac and 4 Grade View: Grade I Tube type: Oral Number of attempts: 1 Airway Equipment and Method: Stylet Placement Confirmation: breath sounds checked- equal and bilateral,  ETT inserted through vocal cords under direct vision and positive ETCO2 Secured at: 22 cm Tube secured with: Tape Dental Injury: Teeth and Oropharynx as per pre-operative assessment

## 2015-05-29 ENCOUNTER — Ambulatory Visit (INDEPENDENT_AMBULATORY_CARE_PROVIDER_SITE_OTHER): Payer: Medicaid Other | Admitting: Gastroenterology

## 2015-05-29 ENCOUNTER — Encounter: Payer: Self-pay | Admitting: Gastroenterology

## 2015-05-29 ENCOUNTER — Other Ambulatory Visit: Payer: Self-pay | Admitting: Family Medicine

## 2015-05-29 VITALS — BP 118/76 | HR 76 | Ht 67.0 in | Wt 276.0 lb

## 2015-05-29 DIAGNOSIS — R0989 Other specified symptoms and signs involving the circulatory and respiratory systems: Secondary | ICD-10-CM

## 2015-05-29 DIAGNOSIS — K219 Gastro-esophageal reflux disease without esophagitis: Secondary | ICD-10-CM | POA: Diagnosis not present

## 2015-05-29 DIAGNOSIS — R1310 Dysphagia, unspecified: Secondary | ICD-10-CM

## 2015-05-29 DIAGNOSIS — F458 Other somatoform disorders: Secondary | ICD-10-CM

## 2015-05-29 DIAGNOSIS — E010 Iodine-deficiency related diffuse (endemic) goiter: Secondary | ICD-10-CM

## 2015-05-29 MED ORDER — OMEPRAZOLE 40 MG PO CPDR: 40.0000 mg | DELAYED_RELEASE_CAPSULE | Freq: Every day | ORAL | Status: DC

## 2015-05-29 NOTE — Progress Notes (Signed)
HPI :  35 y/o female referred here from Dr. Horald Pollen for complaints of globus sensation and dysphagia. The patient has a history of obesity and is s/p cholecystectomy, with medical history as outlined below.   Patient has had a sense of globus since January. She endorses a constant sense of something in her throat. She was seen by ENT and had laryngoscopy which she was told was normal. She reports if she turns her head to the left she can feel it more so than usual, but she reports in general she has globus sensation there constantly.   She has had some dysphagia periodically to her secretions, but she denies dysphagia to solids and liquids when eating or drinking a meal. She has no trouble with liquids at baseline. Occasionally she thinks she has a hard time swallowing her saliva, she is not sure if it is difficulty initiating or not.  She otherwise does endorse a history of acid reflux, she has been on prilosec for years. She has pyrosis as her main symptom of this. She has had some nocturnal regurgitation as well which bothers her. She is taking 20mg  prilosec BID for a long time. She thinks she has benefit from taking it compared to if she does not take it. She will have breakthrough about 3 days per week, which can bother her signficantly. She has both daytime postprandial symptoms and nocturnal symptoms of GERD. She has a dry cough which has been ongoing for maybe 3 weeks or so. She also feels a "sorness" in her throat. She denies any odynophagia at baseline, perhaps sometimes. She does endorse some seasonal allergies, she denies postnasal drip.   She is eating okay. No vomiting. She had gallbladder removed for biliary colic / cholelithiasis in March, and these symptoms were relieved with surgery.  Grandfather with colon cancer.   She has never had a prior endoscopy.  No FH of esophageal cancer.    Past Medical History  Diagnosis Date  . LUMBAR RADICULOPATHY, RIGHT   . HYPERLIPIDEMIA     . ANXIETY   . DEPRESSION   . ADD   . COMMON MIGRAINE   . GERD   . CHOLELITHIASIS   . Pruritus ani   . Unspecified backache   . HYPERSOMNIA   . SMOKER   . 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 Allergic rhinitis, cause unspecified 05/08/2011  . PONV (postoperative nausea and vomiting)     peri op vomiting  . GDM (gestational diabetes mellitus)     baby is 35 months old   . Anemia   . History of blood transfusion      Past Surgical History  Procedure Laterality Date  . Cesarean section      x 1  . Tonsillectomy    . Back surgery  11/09  . Cesarean section with bilateral tubal ligation Bilateral 08/29/2014    Procedure: CESAREAN SECTION WITH BILATERAL TUBAL LIGATION;  Surgeon: Cheri Fowler, MD;  Location: [facility];  Service: Obstetrics;  Laterality: Bilateral;  MD requests RNFA [name] confirmed 7/29...tms  . Cholecystectomy N/A 03/26/2015    Procedure: LAPAROSCOPIC CHOLECYSTECTOMY;  Surgeon: Arta Bruce Kinsinger, MD;  Location: [facility];  Service: General;  Laterality: N/A;   Family History  Problem Relation Age of Onset  . Depression Mother   . Mental illness Mother   . Diabetes Father   . Hypertension Father   . Hyperlipidemia Father   . Diabetes Maternal Grandmother  Dialysis  . Hypertension Maternal Grandmother   . Heart disease Maternal Grandmother   . Cancer Paternal Grandmother     Breast Cancer  . Cancer Other     colon  . Kidney failure Other     dialysis  . Cancer Paternal Grandfather     colon  . Diabetes Brother    Social History  Substance Use Topics  . Smoking status: Current Every Day Smoker -- 0.50 packs/day for 15 years    Types: Cigarettes  . Smokeless tobacco: Never Used  . Alcohol Use: No   Current Outpatient Prescriptions  Medication Sig Dispense Refill  . omeprazole (PRILOSEC) 40 MG capsule Take 1 capsule (40 mg total) by mouth daily. 90 capsule 3   No current facility-administered medications for this visit.    Allergies  Allergen Reactions  . Percocet [Oxycodone-Acetaminophen] Itching    SEVERE ITCHING  . Tramadol Other (See Comments)    MIGRAINE  . Wellbutrin [Bupropion] Hives     Review of Systems: All systems reviewed and negative except where noted in HPI.   Lab Results  Component Value Date   ALT 13* 03/26/2015   AST 16 03/26/2015   ALKPHOS 45 03/26/2015   BILITOT 0.2* 03/26/2015    Lab Results  Component Value Date   WBC 6.1 03/26/2015   HGB 12.7 03/26/2015   HCT 40.6 03/26/2015   MCV 80.7 03/26/2015   PLT 201 03/26/2015    Lab Results  Component Value Date   CREATININE 0.63 03/26/2015   BUN 12 03/26/2015   NA 139 03/26/2015   K 3.7 03/26/2015   CL 106 03/26/2015   CO2 25 03/26/2015     Physical Exam: BP 118/76 mmHg  Pulse 76  Ht 5\' 7"  (1.702 m)  Wt 276 lb (125.193 kg)  BMI 43.22 kg/m2  LMP 05/16/2015 (Exact Date)  Breastfeeding? No Constitutional: Pleasant,well-developed, female in no acute distress. HEENT: Normocephalic and atraumatic. Conjunctivae are normal. No scleral icterus. Cardiovascular: Normal rate, regular rhythm.  Pulmonary/chest: Effort normal and breath sounds normal. No wheezing, rales or rhonchi. Abdominal: Soft, nondistended, protuberant, nontender. Bowel sounds active throughout. There are no masses palpable. No hepatomegaly. Extremities: no edema Lymphadenopathy: No cervical adenopathy noted. Neurological: Alert and oriented to person place and time. Skin: Skin is warm and dry. No rashes noted. Psychiatric: Normal mood and affect. Behavior is normal.   ASSESSMENT AND PLAN: 35 y/o female with longstanding GERD, presenting with symptoms of globus since January, as well as rare dysphagia to her secretions but not to solids or liquids otherwise. She also endorses ongoing dry cough. Prior ENT evaluation reportedly negative however records not available today.   I discussed differential for globus with her in general, and it could be  due to her underlying GERD as she does have some ongoing breakthrough and nocturnal symptoms. Her dysphagia is mild and I doubt any significant anatomic stenosis given she is able to eat and drink without difficulty. While globus can be a symptom of dysmotility, I think it is less likely in her case. I offered her an EGD to evaluate her symptoms at this time, assess for esophagitis, EoE, etc, and in the interim would recommend increasing her prilosec to 40mg  BID for 2 weeks to see if her symptoms, including cough, improve. I did discuss the risks / benefits of PPI use with her, and long term recommend the lowest daily dose needed to control symptoms or use of H2 blockers, but with ongoing symptoms will try a  higher dose for short term. We may consider pH testing to more objectively assess reflux burden pending her course and EGD results. She agreed.   The indications, risks, and benefits of EGD were explained to the patient in detail. Risks include but are not limited to bleeding, perforation, adverse reaction to medications, and cardiopulmonary compromise. Sequelae include but are not limited to the possibility of surgery, hospitalization, and mortality. The patient verbalized understanding and wished to proceed. All questions answered, referred to scheduler. Further recommendations pending results of the exam.   Osgood Cellar, MD [facility] Pager [phone]  CC: Hayden Rasmussen, MD

## 2015-05-29 NOTE — Patient Instructions (Signed)
You have been scheduled for an endoscopy. Please follow written instructions given to you at your visit today. If you use inhalers (even only as needed), please bring them with you on the day of your procedure. Your physician has requested that you go to www.startemmi.com and enter the access code given to you at your visit today. This web site gives a general overview about your procedure. However, you should still follow specific instructions given to you by our office regarding your preparation for the procedure.  Increase Prilosec to 40 mg.

## 2015-05-31 ENCOUNTER — Ambulatory Visit (AMBULATORY_SURGERY_CENTER): Payer: Medicaid Other | Admitting: Gastroenterology

## 2015-05-31 ENCOUNTER — Encounter: Payer: Self-pay | Admitting: Gastroenterology

## 2015-05-31 VITALS — BP 118/78 | HR 64 | Temp 98.0°F | Resp 13 | Ht 67.0 in | Wt 276.0 lb

## 2015-05-31 DIAGNOSIS — R1310 Dysphagia, unspecified: Secondary | ICD-10-CM | POA: Diagnosis not present

## 2015-05-31 MED ORDER — SODIUM CHLORIDE 0.9 % IV SOLN: 500.0000 mL | INTRAVENOUS | Status: DC

## 2015-05-31 NOTE — Patient Instructions (Signed)
Discharge instructions given. Handouts on gastritis and a hiatal hernia. Resume previous medications. YOU HAD AN ENDOSCOPIC PROCEDURE TODAY AT [facility]:   Refer to the procedure report that was given to you for any specific questions about what was found during the examination.  If the procedure report does not answer your questions, please call your gastroenterologist to clarify.  If you requested that your care partner not be given the details of your procedure findings, then the procedure report has been included in a sealed envelope for you to review at your convenience later.  YOU SHOULD EXPECT: Some feelings of bloating in the abdomen. Passage of more gas than usual.  Walking can help get rid of the air that was put into your GI tract during the procedure and reduce the bloating. If you had a lower endoscopy (such as a colonoscopy or flexible sigmoidoscopy) you may notice spotting of blood in your stool or on the toilet paper. If you underwent a bowel prep for your procedure, you may not have a normal bowel movement for a few days.  Please Note:  You might notice some irritation and congestion in your nose or some drainage.  This is from the oxygen used during your procedure.  There is no need for concern and it should clear up in a day or so.  SYMPTOMS TO REPORT IMMEDIATELY:   Following upper endoscopy (EGD)  Vomiting of blood or coffee ground material  New chest pain or pain under the shoulder blades  Painful or persistently difficult swallowing  New shortness of breath  Fever of 100F or higher  Black, tarry-looking stools  For urgent or emergent issues, a gastroenterologist can be reached at any hour by calling [phone].   DIET: Your first meal following the procedure should be a small meal and then it is ok to progress to your normal diet. Heavy or fried foods are harder to digest and may make you feel nauseous or bloated.  Likewise, meals heavy in dairy  and vegetables can increase bloating.  Drink plenty of fluids but you should avoid alcoholic beverages for 24 hours.  ACTIVITY:  You should plan to take it easy for the rest of today and you should NOT DRIVE or use heavy machinery until tomorrow (because of the sedation medicines used during the test).    FOLLOW UP: Our staff will call the number listed on your records the next business day following your procedure to check on you and address any questions or concerns that you may have regarding the information given to you following your procedure. If we do not reach you, we will leave a message.  However, if you are feeling well and you are not experiencing any problems, there is no need to return our call.  We will assume that you have returned to your regular daily activities without incident.  If any biopsies were taken you will be contacted by phone or by letter within the next 1-3 weeks.  Please call us at [phone] if you have not heard about the biopsies in 3 weeks.    SIGNATURES/CONFIDENTIALITY: You and/or your care partner have signed paperwork which will be entered into your electronic medical record.  These signatures attest to the fact that that the information above on your After Visit Summary has been reviewed and is understood.  Full responsibility of the confidentiality of this discharge information lies with you and/or your care-partner.

## 2015-05-31 NOTE — Op Note (Signed)
[facility] Patient Name: Tina [name] Procedure Date: 05/31/2015 8:25 AM MRN: [id number] Endoscopist: Remo Lipps P. Havery Moros , MD Age: 35 Referring MD:  Date of Birth: 1980/09/16 Gender: Female Procedure:                Upper GI endoscopy Indications:              Dysphagia, Heartburn, Globus sensation Medicines:                Monitored Anesthesia Care Procedure:                Pre-Anesthesia Assessment:                           - Prior to the procedure, a History and Physical                            was performed, and patient medications and                            allergies were reviewed. The patient's tolerance of                            previous anesthesia was also reviewed. The risks                            and benefits of the procedure and the sedation                            options and risks were discussed with the patient.                            All questions were answered, and informed consent                            was obtained. Prior Anticoagulants: The patient has                            taken no previous anticoagulant or antiplatelet                            agents. ASA Grade Assessment: II - A patient with                            mild systemic disease. After reviewing the risks                            and benefits, the patient was deemed in                            satisfactory condition to undergo the procedure.                           After obtaining informed consent, the endoscope was  passed under direct vision. Throughout the                            procedure, the patient's blood pressure, pulse, and                            oxygen saturations were monitored continuously. The                            Model GIF-[id number]) scope was introduced                            through the mouth, and advanced to the second part                            of duodenum. The upper GI  endoscopy was                            accomplished without difficulty. The patient                            tolerated the procedure well. Scope In: Scope Out: Findings:                 Esophagogastric landmarks were identified: the                            Z-line was found at 33 cm, the gastroesophageal                            junction was found at 33 cm and the upper extent of                            the gastric folds was found at 36 cm from the                            incisors.                           A 3 cm hiatal hernia was present.                           The exam of the esophagus was otherwise normal.                           Biopsies were taken with a cold forceps in the                            upper third of the esophagus, in the middle third                            of the esophagus and in the lower third of the  esophagus for histology.                           Patchy moderate inflammation characterized by                            erythema was found in the gastric antrum. Biopsies                            were taken with a cold forceps for Helicobacter                            pylori testing from the antrum and body.                           The exam of the stomach was otherwise normal.                           The duodenal bulb and second portion of the                            duodenum were normal. Complications:            No immediate complications. Estimated blood loss:                            Minimal. Estimated Blood Loss:     Estimated blood loss was minimal. Impression:               - Esophagogastric landmarks identified.                           - 3 cm hiatal hernia.                           - Normal esophagus otherwise, biopsies taken to                            rule out EoE                           - Gastritis. Biopsied.                           - Normal duodenal bulb and second portion of the                             duodenum.                           - Biopsies were taken with a cold forceps for                            histology in the upper third of the esophagus, in                            the middle  third of the esophagus and in the lower                            third of the esophagus. Recommendation:           - Patient has a contact number available for                            emergencies. The signs and symptoms of potential                            delayed complications were discussed with the                            patient. Return to normal activities tomorrow.                            Written discharge instructions were provided to the                            patient.                           - Resume previous diet.                           - Continue present medications (trial of 40mg                             prilosec twice daily to see if it helps symptoms)                           - Await pathology results.                           - I would consider a 24 HR pH impedance test with                            manometry on high dose PPI if symptoms persist to                            clarify if the patient has breakthrough acid                            reflux, nonacid reflux, hypersensitive esophagus,                            etc. Carlota Raspberry. Alexandria Current, MD 05/31/2015 8:47:43 AM This report has been signed electronically.

## 2015-05-31 NOTE — Progress Notes (Signed)
Patient awakening,vss,report to rn 

## 2015-05-31 NOTE — Progress Notes (Signed)
Called to room to assist during endoscopic procedure.  Patient ID and intended procedure confirmed with present staff. Received instructions for my participation in the procedure from the performing physician.  

## 2015-06-01 ENCOUNTER — Telehealth: Payer: Self-pay | Admitting: *Deleted

## 2015-06-01 NOTE — Telephone Encounter (Signed)
  Follow up Call-  Call back number 05/31/2015  Post procedure Call Back phone  # [phone]  Permission to leave phone message Yes     Patient questions:  Message left to call us if necessary.

## 2015-06-04 ENCOUNTER — Other Ambulatory Visit: Payer: Medicaid Other

## 2015-10-02 ENCOUNTER — Other Ambulatory Visit: Payer: Self-pay | Admitting: Nurse Practitioner

## 2015-10-02 ENCOUNTER — Ambulatory Visit
Admission: RE | Admit: 2015-10-02 | Discharge: 2015-10-02 | Disposition: A | Discharge status: Home / Self Care | Payer: Medicaid Other | Source: Ambulatory Visit | Attending: Nurse Practitioner | Admitting: Nurse Practitioner

## 2015-10-02 DIAGNOSIS — M546 Pain in thoracic spine: Secondary | ICD-10-CM

## 2015-10-02 DIAGNOSIS — M25512 Pain in left shoulder: Secondary | ICD-10-CM

## 2016-01-22 ENCOUNTER — Encounter (HOSPITAL_BASED_OUTPATIENT_CLINIC_OR_DEPARTMENT_OTHER): Payer: Self-pay | Admitting: *Deleted

## 2016-01-22 NOTE — Progress Notes (Addendum)
NPO AFTER MN.  ARRIVE AT 0600.  NEEDS URINE PREG.  GETTING CBC DONE Wednesday 01-23-2016.  WILL TAKE DEXILANT AM DOS W/ SIPS OF WATER.   ADDENDUM:  CALLED AND SPOKE TO PT VIA PHONE YESTERDAY, 01-24-2016,  TO GET LAB WORK DONE.  PT STATED WAS COMING 01-24-2016.  TODAY Friday 01-25-2016 PT HAS COME TO GET LAB WORK DONE.  CALLED AND LM FOR PT VIA PHONE , IF ABLE TO GET LAB DONE TODAY BE HERE BY 1400 AND IF NOT SHE NEEDS TO ARRIVE AT [phone] DOS NOT 0600.

## 2016-01-27 NOTE — H&P (Signed)
Tina [name] is an 36 y.o. female.  She was seen for her annual exam and to discuss heavy menses in October. Has menses every month, slightly irregular, heavy and can last for 2 weeks.  Saline infusion u/s was normal, pipelle benign, she wants to proceed with endometrial ablation.  Pertinent Gynecological History: Last pap: normal Date: 10/2015 OB History: G3, P2012   Menstrual History: Patient's last menstrual period was 01/02/2016 (approximate).    Past Medical History:  Diagnosis Date  . Allergic rhinitis   . Anxiety   . Chronic thoracic back pain   . GERD (gastroesophageal reflux disease)   . History of gestational diabetes   . Hyperlipidemia   . Major depression   . Menorrhagia   . Migraine   . PONV (postoperative nausea and vomiting)     Past Surgical History:  Procedure Laterality Date  . CESAREAN SECTION  05/21/2004  . CESAREAN SECTION WITH BILATERAL TUBAL LIGATION Bilateral 08/29/2014   Procedure: CESAREAN SECTION WITH BILATERAL TUBAL LIGATION;  Surgeon: Tina Fowler, MD;  Location: [facility];  Service: Obstetrics;  Laterality: Bilateral;  MD requests RNFA Tina H. RNFA confirmed 7/29...tms  . CHOLECYSTECTOMY N/A 03/26/2015   Procedure: LAPAROSCOPIC CHOLECYSTECTOMY;  Surgeon: Tina Bruce Kinsinger, MD;  Location: [facility];  Service: General;  Laterality: N/A;  . LUMBAR MICRODISCECTOMY  11/17/2007   L5-  S1  . TONSILLECTOMY  2001    Family History  Problem Relation Age of Onset  . Depression Mother   . Mental illness Mother   . Diabetes Father   . Hypertension Father   . Hyperlipidemia Father   . Diabetes Maternal Grandmother     Dialysis  . Hypertension Maternal Grandmother   . Heart disease Maternal Grandmother   . Cancer Paternal Grandmother     Breast Cancer  . Cancer Paternal Grandfather     colon  . Colon cancer Paternal Grandfather   . Diabetes Brother   . Cancer Other     colon  . Kidney failure Other     dialysis  . Colon cancer Paternal Uncle    . Esophageal cancer Neg Hx   . Stomach cancer Neg Hx   . Rectal cancer Neg Hx     Social History:  reports that she has been smoking Cigarettes.  She has a 10.00 pack-year smoking history. She has never used smokeless tobacco. She reports that she does not drink alcohol or use drugs.  Allergies:  Allergies  Allergen Reactions  . Oxycodone Hives and Itching    SEVERE (percocet)  . Tramadol Other (See Comments)    "Trigger's migraine's"  . Wellbutrin [Bupropion] Hives    No prescriptions prior to admission.    Review of Systems  Respiratory: Negative.   Cardiovascular: Negative.   Gastrointestinal: Negative.   Genitourinary: Negative.     Height 5\' 7"  (1.702 m), weight 113.4 kg (250 lb), last menstrual period 01/02/2016, not currently breastfeeding. Physical Exam  Constitutional: She appears well-developed and well-nourished.  Neck: Neck supple. No thyromegaly present.  Cardiovascular: Normal rate, regular rhythm and normal heart sounds.   No murmur heard. Respiratory: Effort normal and breath sounds normal. No respiratory distress. She has no wheezes.  GI: Soft. She exhibits no distension and no mass. There is no tenderness.  Genitourinary: Vagina normal and uterus normal.    No results found for this or any previous visit (from the past 24 hour(s)).  No results found.  Assessment/Plan: Menorrhagia, normal ultrasound, benign pipelle.  Medical and  surgical options have been discussed, she wants to proceed with endometrial ablation.  Surgical procedure, risks, chances of relieving her menorrhagia were all discussed.  Will admit for Novasure ablation.  Tina [name] D 01/27/2016, 8:22 PM

## 2016-01-27 NOTE — Anesthesia Preprocedure Evaluation (Addendum)
Anesthesia Evaluation  Patient identified by MRN, date of birth, ID band Patient awake    Reviewed: Allergy & Precautions, H&P , NPO status , Patient's Chart, lab work & pertinent test results  History of Anesthesia Complications (+) PONV and history of anesthetic complications  Airway Mallampati: I  TM Distance: >3 FB Neck ROM: full    Dental no notable dental hx. (+) Teeth Intact, Dental Advisory Given   Pulmonary Current Smoker,    Pulmonary exam normal breath sounds clear to auscultation       Cardiovascular negative cardio ROS Normal cardiovascular exam Rhythm:regular Rate:Normal     Neuro/Psych PSYCHIATRIC DISORDERS Anxiety Depression negative neurological ROS     GI/Hepatic negative GI ROS, Neg liver ROS, GERD  ,  Endo/Other  diabetesMorbid obesity  Renal/GU negative Renal ROS     Musculoskeletal negative musculoskeletal ROS (+) Arthritis ,   Abdominal (+) + obese,   Peds  Hematology negative hematology ROS (+)   Anesthesia Other Findings   Reproductive/Obstetrics negative OB ROS                           Anesthesia Physical  Anesthesia Plan  ASA: III  Anesthesia Plan: General   Post-op Pain Management:    Induction: Intravenous  Airway Management Planned: LMA  Additional Equipment:   Intra-op Plan:   Post-operative Plan: Extubation in OR  Informed Consent: I have reviewed the patients History and Physical, chart, labs and discussed the procedure including the risks, benefits and alternatives for the proposed anesthesia with the patient or authorized representative who has indicated his/her understanding and acceptance.   Dental Advisory Given  Plan Discussed with: Anesthesiologist, CRNA and Surgeon  Anesthesia Plan Comments:         Anesthesia Quick Evaluation

## 2016-01-28 ENCOUNTER — Encounter (HOSPITAL_BASED_OUTPATIENT_CLINIC_OR_DEPARTMENT_OTHER)
Admission: RE | Disposition: A | Discharge status: Home / Self Care | Payer: Self-pay | Source: Ambulatory Visit | Attending: Obstetrics and Gynecology

## 2016-01-28 ENCOUNTER — Ambulatory Visit (HOSPITAL_BASED_OUTPATIENT_CLINIC_OR_DEPARTMENT_OTHER): Payer: Medicaid Other | Admitting: Anesthesiology

## 2016-01-28 ENCOUNTER — Ambulatory Visit (HOSPITAL_BASED_OUTPATIENT_CLINIC_OR_DEPARTMENT_OTHER)
Admission: RE | Admit: 2016-01-28 | Discharge: 2016-01-28 | Disposition: A | Discharge status: Home / Self Care | Payer: Medicaid Other | Source: Ambulatory Visit | Attending: Obstetrics and Gynecology | Admitting: Obstetrics and Gynecology

## 2016-01-28 ENCOUNTER — Encounter (HOSPITAL_BASED_OUTPATIENT_CLINIC_OR_DEPARTMENT_OTHER): Payer: Self-pay | Admitting: *Deleted

## 2016-01-28 DIAGNOSIS — F17210 Nicotine dependence, cigarettes, uncomplicated: Secondary | ICD-10-CM | POA: Insufficient documentation

## 2016-01-28 DIAGNOSIS — K219 Gastro-esophageal reflux disease without esophagitis: Secondary | ICD-10-CM | POA: Diagnosis not present

## 2016-01-28 DIAGNOSIS — F419 Anxiety disorder, unspecified: Secondary | ICD-10-CM | POA: Insufficient documentation

## 2016-01-28 DIAGNOSIS — F329 Major depressive disorder, single episode, unspecified: Secondary | ICD-10-CM | POA: Diagnosis not present

## 2016-01-28 DIAGNOSIS — E785 Hyperlipidemia, unspecified: Secondary | ICD-10-CM | POA: Insufficient documentation

## 2016-01-28 DIAGNOSIS — N920 Excessive and frequent menstruation with regular cycle: Secondary | ICD-10-CM | POA: Diagnosis present

## 2016-01-28 HISTORY — DX: Hyperlipidemia, unspecified: E78.5

## 2016-01-28 HISTORY — PX: DILITATION & CURRETTAGE/HYSTROSCOPY WITH NOVASURE ABLATION: SHX5568

## 2016-01-28 HISTORY — DX: Excessive and frequent menstruation with regular cycle: N92.0

## 2016-01-28 HISTORY — DX: Gastro-esophageal reflux disease without esophagitis: K21.9

## 2016-01-28 HISTORY — DX: Allergic rhinitis, unspecified: J30.9

## 2016-01-28 HISTORY — DX: Personal history of gestational diabetes: Z86.32

## 2016-01-28 HISTORY — DX: Anxiety disorder, unspecified: F41.9

## 2016-01-28 HISTORY — DX: Major depressive disorder, single episode, unspecified: F32.9

## 2016-01-28 HISTORY — DX: Migraine, unspecified, not intractable, without status migrainosus: G43.909

## 2016-01-28 HISTORY — DX: Pain in thoracic spine: M54.6

## 2016-01-28 HISTORY — DX: Other chronic pain: G89.29

## 2016-01-28 LAB — CBC
HEMATOCRIT: 31.6 % — AB (ref 36.0–46.0)
Hemoglobin: 9.5 g/dL — ABNORMAL LOW (ref 12.0–15.0)
MCH: 21.1 pg — ABNORMAL LOW (ref 26.0–34.0)
MCHC: 30.1 g/dL (ref 30.0–36.0)
MCV: 70.1 fL — ABNORMAL LOW (ref 78.0–100.0)
Platelets: 224 10*3/uL (ref 150–400)
RBC: 4.51 MIL/uL (ref 3.87–5.11)
RDW: 17.2 % — ABNORMAL HIGH (ref 11.5–15.5)
WBC: 5.8 10*3/uL (ref 4.0–10.5)

## 2016-01-28 LAB — POCT PREGNANCY, URINE: Preg Test, Ur: NEGATIVE

## 2016-01-28 SURGERY — DILATATION & CURETTAGE/HYSTEROSCOPY WITH NOVASURE ABLATION
Anesthesia: General

## 2016-01-28 MED ORDER — FENTANYL CITRATE (PF) 100 MCG/2ML IJ SOLN
INTRAMUSCULAR | Status: AC
  Filled 2016-01-28: qty 2

## 2016-01-28 MED ORDER — ARTIFICIAL TEARS OP OINT
TOPICAL_OINTMENT | OPHTHALMIC | Status: AC
  Filled 2016-01-28: qty 3.5

## 2016-01-28 MED ORDER — KETOROLAC TROMETHAMINE 30 MG/ML IJ SOLN
INTRAMUSCULAR | Status: AC
Start: 2016-01-28 — End: 2016-01-28
  Filled 2016-01-28: qty 1

## 2016-01-28 MED ORDER — MEPERIDINE HCL 25 MG/ML IJ SOLN
6.2500 mg | INTRAMUSCULAR | Status: DC | PRN
  Filled 2016-01-28: qty 1

## 2016-01-28 MED ORDER — FENTANYL CITRATE (PF) 100 MCG/2ML IJ SOLN
INTRAMUSCULAR | Status: DC | PRN
  Administered 2016-01-28: 50 ug via INTRAVENOUS
  Administered 2016-01-28: 100 ug via INTRAVENOUS
  Administered 2016-01-28: 50 ug via INTRAVENOUS

## 2016-01-28 MED ORDER — PROPOFOL 10 MG/ML IV BOLUS
INTRAVENOUS | Status: DC | PRN
  Administered 2016-01-28: 200 mg via INTRAVENOUS

## 2016-01-28 MED ORDER — MIDAZOLAM HCL 2 MG/2ML IJ SOLN
INTRAMUSCULAR | Status: AC
  Filled 2016-01-28: qty 2

## 2016-01-28 MED ORDER — LIDOCAINE HCL 2 % IJ SOLN
INTRAMUSCULAR | Status: DC | PRN
  Administered 2016-01-28: 12 mL

## 2016-01-28 MED ORDER — LACTATED RINGERS IR SOLN
Status: DC | PRN
  Administered 2016-01-28: 3000 mL

## 2016-01-28 MED ORDER — LACTATED RINGERS IV SOLN
INTRAVENOUS | Status: DC
  Administered 2016-01-28 (×2): via INTRAVENOUS
  Filled 2016-01-28: qty 1000

## 2016-01-28 MED ORDER — DEXAMETHASONE SODIUM PHOSPHATE 4 MG/ML IJ SOLN
INTRAMUSCULAR | Status: DC | PRN
  Administered 2016-01-28: 10 mg via INTRAVENOUS

## 2016-01-28 MED ORDER — ONDANSETRON HCL 4 MG/2ML IJ SOLN
INTRAMUSCULAR | Status: AC
  Filled 2016-01-28: qty 2

## 2016-01-28 MED ORDER — HYDROCODONE-ACETAMINOPHEN 5-325 MG PO TABS: 1.0000 | ORAL_TABLET | Freq: Four times a day (QID) | ORAL | 0 refills | Status: AC | PRN

## 2016-01-28 MED ORDER — DEXAMETHASONE SODIUM PHOSPHATE 10 MG/ML IJ SOLN
INTRAMUSCULAR | Status: AC
  Filled 2016-01-28: qty 1

## 2016-01-28 MED ORDER — LIDOCAINE-EPINEPHRINE 2 %-1:100000 IJ SOLN
INTRAMUSCULAR | Status: AC
  Filled 2016-01-28: qty 1

## 2016-01-28 MED ORDER — HYDROCODONE-ACETAMINOPHEN 7.5-325 MG PO TABS
1.0000 | ORAL_TABLET | Freq: Once | ORAL | Status: DC | PRN
  Filled 2016-01-28: qty 1

## 2016-01-28 MED ORDER — MIDAZOLAM HCL 5 MG/5ML IJ SOLN
INTRAMUSCULAR | Status: DC | PRN
  Administered 2016-01-28: 2 mg via INTRAVENOUS
  Administered 2016-01-28: 1 mg via INTRAVENOUS

## 2016-01-28 MED ORDER — PROMETHAZINE HCL 25 MG/ML IJ SOLN
6.2500 mg | INTRAMUSCULAR | Status: DC | PRN
  Filled 2016-01-28: qty 1

## 2016-01-28 MED ORDER — LIDOCAINE 2% (20 MG/ML) 5 ML SYRINGE
INTRAMUSCULAR | Status: DC | PRN
  Administered 2016-01-28: 80 mg via INTRAVENOUS

## 2016-01-28 MED ORDER — HYDROMORPHONE HCL 1 MG/ML IJ SOLN
0.2500 mg | INTRAMUSCULAR | Status: DC | PRN
  Administered 2016-01-28 (×2): 0.5 mg via INTRAVENOUS
  Filled 2016-01-28: qty 0.5

## 2016-01-28 MED ORDER — PROPOFOL 10 MG/ML IV BOLUS
INTRAVENOUS | Status: AC
  Filled 2016-01-28: qty 40

## 2016-01-28 MED ORDER — KETOROLAC TROMETHAMINE 30 MG/ML IJ SOLN
INTRAMUSCULAR | Status: DC | PRN
  Administered 2016-01-28: 30 mg via INTRAVENOUS

## 2016-01-28 MED ORDER — HYDROMORPHONE HCL 2 MG/ML IJ SOLN
INTRAMUSCULAR | Status: AC
  Filled 2016-01-28: qty 1

## 2016-01-28 SURGICAL SUPPLY — 35 items
ABLATOR ENDOMETRIAL BIPOLAR (ABLATOR) ×2 IMPLANT
BIPOLAR CUTTING LOOP 21FR (ELECTRODE)
CANISTER SUCTION 2500CC (MISCELLANEOUS) ×2 IMPLANT
CATH ROBINSON RED A/P 16FR (CATHETERS) ×1 IMPLANT
COVER BACK TABLE 60X90IN (DRAPES) ×2 IMPLANT
DILATOR CANAL MILEX (MISCELLANEOUS) IMPLANT
DRAPE LG THREE QUARTER DISP (DRAPES) ×2 IMPLANT
DRSG TELFA 3X8 NADH (GAUZE/BANDAGES/DRESSINGS) ×2 IMPLANT
ELECT REM PT RETURN 9FT ADLT (ELECTROSURGICAL) ×2
ELECTRODE REM PT RTRN 9FT ADLT (ELECTROSURGICAL) ×1 IMPLANT
GLOVE BIO SURGEON STRL SZ7 (GLOVE) ×1 IMPLANT
GLOVE BIO SURGEON STRL SZ8 (GLOVE) ×2 IMPLANT
GLOVE BIOGEL PI IND STRL 7.0 (GLOVE) IMPLANT
GLOVE BIOGEL PI INDICATOR 7.0 (GLOVE) ×2
GLOVE ORTHO TXT STRL SZ7.5 (GLOVE) ×2 IMPLANT
GOWN STRL REUS W/ TWL LRG LVL3 (GOWN DISPOSABLE) ×1 IMPLANT
GOWN STRL REUS W/ TWL XL LVL3 (GOWN DISPOSABLE) ×1 IMPLANT
GOWN STRL REUS W/TWL LRG LVL3 (GOWN DISPOSABLE) ×2
GOWN STRL REUS W/TWL XL LVL3 (GOWN DISPOSABLE) ×2
KIT ROOM TURNOVER WOR (KITS) ×2 IMPLANT
LEGGING LITHOTOMY PAIR STRL (DRAPES) ×2 IMPLANT
LOOP CUTTING BIPOLAR 21FR (ELECTRODE) IMPLANT
NDL SPNL 22GX3.5 QUINCKE BK (NEEDLE) ×1 IMPLANT
NEEDLE SPNL 22GX3.5 QUINCKE BK (NEEDLE) ×2 IMPLANT
PACK BASIN DAY SURGERY FS (CUSTOM PROCEDURE TRAY) ×2 IMPLANT
PAD DRESSING TELFA 3X8 NADH (GAUZE/BANDAGES/DRESSINGS) ×1 IMPLANT
PAD OB MATERNITY 4.3X12.25 (PERSONAL CARE ITEMS) ×2 IMPLANT
PAD PREP 24X48 CUFFED NSTRL (MISCELLANEOUS) ×2 IMPLANT
SYR CONTROL 10ML LL (SYRINGE) ×2 IMPLANT
TOWEL OR 17X24 6PK STRL BLUE (TOWEL DISPOSABLE) ×4 IMPLANT
TRAY DSU PREP LF (CUSTOM PROCEDURE TRAY) ×2 IMPLANT
TUBE CONNECTING 12X1/4 (SUCTIONS) ×1 IMPLANT
TUBING AQUILEX INFLOW (TUBING) ×2 IMPLANT
TUBING AQUILEX OUTFLOW (TUBING) ×2 IMPLANT
WATER STERILE IRR 500ML POUR (IV SOLUTION) ×2 IMPLANT

## 2016-01-28 NOTE — Transfer of Care (Signed)
Immediate Anesthesia Transfer of Care Note  Patient: Tina [name]  Procedure(s) Performed: Procedure(s): DILATATION & CURETTAGE/HYSTEROSCOPY WITH NOVASURE ABLATION (N/A)  Patient Location: PACU  Anesthesia Type:General  Level of Consciousness: awake, alert , oriented and patient cooperative  Airway & Oxygen Therapy: Patient Spontanous Breathing and Patient connected to nasal cannula oxygen  Post-op Assessment: Report given to RN and Post -op Vital signs reviewed and stable  Post vital signs: Reviewed and stable  Last Vitals:  Vitals:   01/28/16 0558  BP: 119/63  Pulse: 75  Resp: 16  Temp: 36.8 C    Last Pain:  Vitals:   01/28/16 0558  TempSrc: Oral      Patients Stated Pain Goal: 9 ([phone] [id number])  Complications: No apparent anesthesia complications

## 2016-01-28 NOTE — Discharge Instructions (Signed)
D & C Home care Instructions:   Personal hygiene:  Used sanitary napkins for vaginal drainage not tampons. Shower or tub bathe the day after your procedure. No douching until bleeding stops. Always wipe from front to back after  Elimination.  Activity: Do not drive or operate any equipment today. The effects of the anesthesia are still present and drowsiness may result. Rest today, not necessarily flat bed rest, just take it easy. You may resume your normal activity in one to 2 days.  Sexual activity: No intercourse for one week or as indicated by your physician  Diet: Eat a light diet as desired this evening. You may resume a regular diet tomorrow.  Return to work: One to 2 days.  General Expectations of your surgery: Vaginal bleeding should be no heavier than a normal period. Spotting may continue up to 10 days. Mild cramps may continue for a couple of days. You may have a regular period in 2-6 weeks.  Unexpected observations call your doctor if these occur: persistent or heavy bleeding. Severe abdominal cramping or pain. Elevation of temperature greater than 100F.  Call for an appointment in one week.    Patient's Signature_______________________________________________________  Nurse's Signature________________________________________________________Routine instructions for endometrial ablation   Endometrial Ablation Endometrial ablation removes the lining of the uterus (endometrium). It is usually a same-day, outpatient treatment. Ablation helps avoid major surgery, such as surgery to remove the cervix and uterus (hysterectomy). After endometrial ablation, you will have little or no menstrual bleeding and may not be able to have children. However, if you are premenopausal, you will need to use a reliable method of birth control following the procedure because of the small chance that pregnancy can occur. There are different reasons to have this procedure. These reasons  include:  Heavy periods.  Bleeding that is causing anemia.  Irregular bleeding.  Bleeding fibroids on the lining inside the uterus if they are smaller than 3 centimeters. This procedure may not be possible for you if:   You want to have children in the future.   You have severe cramps with your menstrual period.   You have precancerous or cancerous cells in your uterus.   You were recently pregnant.   You have gone through menopause.   You have had major surgery on your uterus, resulting in thinning of the uterine wall. Surgeries may include:  The removal of one or more uterine fibroids (myomectomy).  A cesarean section with a classic (vertical) incision on your uterus. Ask your health care provider what type of cesarean you had. Sometimes the scar on your skin is different than the scar on your uterus. Even if you have had surgery on your uterus, certain types of ablation may still be safe for you. Talk with your health care provider. LET [facility] CARE PROVIDER KNOW ABOUT:  Any allergies you have.  All medicines you are taking, including vitamins, herbs, eye drops, creams, and over-the-counter medicines.  Previous problems you or members of your family have had with the use of anesthetics.  Any blood disorders you have.  Previous surgeries you have had.  Medical conditions you have. RISKS AND COMPLICATIONS  Generally, this is a safe procedure. However, as with any procedure, complications can occur. Possible complications include:  Perforation of the uterus.  Bleeding.  Infection of the uterus, bladder, or vagina.  Injury to surrounding organs.  An air bubble to the lung (air embolus).  Pregnancy following the procedure.  Failure of the procedure to help  the problem, requiring hysterectomy.  Decreased ability to diagnose cancer in the lining of the uterus. BEFORE THE PROCEDURE  The lining of the uterus must be tested to make sure there is no  pre-cancerous or cancer cells present.  An ultrasound may be performed to look at the size of the uterus and to check for abnormalities.  Medicines may be given to thin the lining of the uterus. PROCEDURE  During the procedure, your health care provider will use a tool called a resectoscope to help see inside your uterus. There are different ways to remove the lining of your uterus.   Radiofrequency - This method uses a radiofrequency-alternating electric current to remove the lining of the uterus.  Cryotherapy - This method uses extreme cold to freeze the lining of the uterus.  Heated-Free Liquid - This method uses heated salt (saline) solution to remove the lining of the uterus.  Microwave - This method uses high-energy microwaves to heat up the lining of the uterus to remove it.  Thermal balloon - This method involves inserting a catheter with a balloon tip into the uterus. The balloon tip is filled with heated fluid to remove the lining of the uterus. AFTER THE PROCEDURE  After your procedure, do not have sexual intercourse or insert anything into your vagina until permitted by your health care provider. After the procedure, you may experience:  Cramps.  Vaginal discharge.  Frequent urination. This information is not intended to replace advice given to you by your health care provider. Make sure you discuss any questions you have with your health care provider. Document Released: 11/09/2003 Document Revised: 09/20/2014 Document Reviewed: 06/02/2012 Elsevier Interactive Patient Education  2017 [place] [facility] Home Care Instructions  Activity: Get plenty of rest for the remainder of the day. A responsible adult should stay with you for 24 hours following the procedure.  For the next 24 hours, DO NOT: -Drive a car -Paediatric nurse -Drink alcoholic beverages -Take any medication unless instructed by your physician -Make any legal decisions or sign important  papers.  Meals: Start with liquid foods such as gelatin or soup. Progress to regular foods as tolerated. Avoid greasy, spicy, heavy foods. If nausea and/or vomiting occur, drink only clear liquids until the nausea and/or vomiting subsides. Call your physician if vomiting continues.  Special Instructions/Symptoms: Your throat may feel dry or sore from the anesthesia or the breathing tube placed in your throat during surgery. If this causes discomfort, gargle with warm salt water. The discomfort should disappear within 24 hours.  If you had a scopolamine patch placed behind your ear for the management of post- operative nausea and/or vomiting:  1. The medication in the patch is effective for 72 hours, after which it should be removed.  Wrap patch in a tissue and discard in the trash. Wash hands thoroughly with soap and water. 2. You may remove the patch earlier than 72 hours if you experience unpleasant side effects which may include dry mouth, dizziness or visual disturbances. 3. Avoid touching the patch. Wash your hands with soap and water after contact with the patch.

## 2016-01-28 NOTE — Op Note (Signed)
Preoperative diagnosis: Menorrhagia Postoperative diagnosis: Menorrhagia Procedure: Hysteroscopy, NovaSure endometrial ablation Surgeon: Cheri Fowler M.D. Anesthesia: Gen. With an LMA, deep paracervical block Findings: She had a normal endometrial cavity with no unusual lesions. The NovaSure device used to a depth of 6 cm, a width of 4.7 cm and used 155 W for 95 seconds. Fluid deficit to the hysteroscope was minimal. Estimated blood loss: Minimal Specimens: None Complications: None  Procedure in detail: The patient was taken to the operating room and placed in the dorsosupine position. General anesthesia was induced and she was placed in mobile stirrups. Perineum and vagina were prepped and draped in usual sterile fashion and bladder drained with a red Robinson catheter. A Graves speculum was inserted into the vagina and the anterior lip of the cervix was grasped with a single-tooth tenaculum. The paracervical block was then performed with a total of 12 cc 2% lidocaine with epi. Uterus then sounded to 10 cm. Cervix was easily dilated to size 23 dilator. The observer hysteroscope was inserted and good visualization was achieved using lactated Ringer's. The endometrial cavity was normal with no fibroids or significant polyps. The hysteroscope was removed. The cervix was further dilated to a size 27 dilator measuring the cervix at 4 cm. The NovaSure device was inserted and deployed properly. The CO2 test passed. Endometrial ablation was performed with the above-mentioned settings without difficulty. The device was then allowed to cool for about 30 seconds and was removed. Hysteroscopy was then performed which revealed good global endometrial ablation and still no lesions. Hysteroscope and fluid were then removed. The single-tooth tenaculum was removed from the cervix. Bleeding was controlled with pressure. All instruments were then removed from the vagina. The patient tolerated the procedure well and was  taken to the recovery in stable condition. Counts were correct, she received no antibiotics, she had PAS hose on throughout the procedure.

## 2016-01-28 NOTE — Anesthesia Procedure Notes (Signed)
Procedure Name: LMA Insertion Date/Time: 01/28/2016 7:34 AM Performed by: Wanita Chamberlain Pre-anesthesia Checklist: Patient identified, Timeout performed, Emergency Drugs available, Suction available and Patient being monitored Patient Re-evaluated:Patient Re-evaluated prior to inductionOxygen Delivery Method: Circle system utilized Preoxygenation: Pre-oxygenation with 100% oxygen Intubation Type: IV induction Ventilation: Mask ventilation without difficulty LMA: LMA inserted LMA Size: 4.0 Number of attempts: 1 Placement Confirmation: positive ETCO2 and breath sounds checked- equal and bilateral Tube secured with: Tape Dental Injury: Teeth and Oropharynx as per pre-operative assessment

## 2016-01-28 NOTE — Interval H&P Note (Signed)
History and Physical Interval Note:  01/28/2016 7:16 AM  Tina [name]  has presented today for surgery, with the diagnosis of menorrhagia  The various methods of treatment have been discussed with the patient and family. After consideration of risks, benefits and other options for treatment, the patient has consented to  Procedure(s): [place] (N/A) as a surgical intervention .  The patient's history has been reviewed, patient examined, no change in status, stable for surgery.  I have reviewed the patient's chart and labs.  Questions were answered to the patient's satisfaction.     Nguyet Mercer D

## 2016-01-29 ENCOUNTER — Encounter (HOSPITAL_BASED_OUTPATIENT_CLINIC_OR_DEPARTMENT_OTHER): Payer: Self-pay | Admitting: Obstetrics and Gynecology

## 2016-01-29 NOTE — Anesthesia Postprocedure Evaluation (Signed)
Anesthesia Post Note  Patient: Tina [name]  Procedure(s) Performed: Procedure(s) (LRB): DILATATION & CURETTAGE/HYSTEROSCOPY WITH NOVASURE ABLATION (N/A)  Patient location during evaluation: PACU Anesthesia Type: General Level of consciousness: sedated and patient cooperative Pain management: pain level controlled Vital Signs Assessment: post-procedure vital signs reviewed and stable Respiratory status: spontaneous breathing Cardiovascular status: stable Anesthetic complications: no       Last Vitals:  Vitals:   01/28/16 0915 01/28/16 0951  BP: 111/68 127/68  Pulse: 71 65  Resp: 13 12  Temp:  36.7 C    Last Pain:  Vitals:   01/28/16 0951  TempSrc:   PainSc: [place]

## 2016-02-11 ENCOUNTER — Other Ambulatory Visit: Payer: Self-pay | Admitting: Orthopedic Surgery

## 2016-02-11 DIAGNOSIS — M545 Low back pain: Secondary | ICD-10-CM

## 2016-02-17 ENCOUNTER — Ambulatory Visit
Admission: RE | Admit: 2016-02-17 | Discharge: 2016-02-17 | Disposition: A | Discharge status: Home / Self Care | Payer: Medicaid Other | Source: Ambulatory Visit | Attending: Orthopedic Surgery | Admitting: Orthopedic Surgery

## 2016-02-17 DIAGNOSIS — M545 Low back pain: Secondary | ICD-10-CM

## 2016-03-01 ENCOUNTER — Encounter (HOSPITAL_COMMUNITY): Payer: Self-pay | Admitting: *Deleted

## 2016-03-01 ENCOUNTER — Emergency Department (HOSPITAL_COMMUNITY)
Admission: EM | Admit: 2016-03-01 | Discharge: 2016-03-01 | Disposition: A | Discharge status: Home / Self Care | Payer: Medicaid Other | Attending: Emergency Medicine | Admitting: Emergency Medicine

## 2016-03-01 DIAGNOSIS — F17210 Nicotine dependence, cigarettes, uncomplicated: Secondary | ICD-10-CM | POA: Diagnosis not present

## 2016-03-01 DIAGNOSIS — G8929 Other chronic pain: Secondary | ICD-10-CM | POA: Diagnosis not present

## 2016-03-01 DIAGNOSIS — M545 Low back pain: Secondary | ICD-10-CM | POA: Diagnosis not present

## 2016-03-01 MED ORDER — ACETAMINOPHEN 500 MG PO TABS
1000.0000 mg | ORAL_TABLET | Freq: Once | ORAL | Status: DC
  Filled 2016-03-01: qty 2

## 2016-03-01 MED ORDER — ACETAMINOPHEN ER 650 MG PO TBCR: 650.0000 mg | EXTENDED_RELEASE_TABLET | Freq: Three times a day (TID) | ORAL | 0 refills | Status: DC | PRN

## 2016-03-01 MED ORDER — LIDOCAINE 5 % EX PTCH: 1.0000 | MEDICATED_PATCH | CUTANEOUS | 0 refills | Status: DC

## 2016-03-01 MED ORDER — NAPROXEN 500 MG PO TABS: 500.0000 mg | ORAL_TABLET | Freq: Two times a day (BID) | ORAL | 0 refills | Status: DC

## 2016-03-01 MED ORDER — METHOCARBAMOL 500 MG PO TABS: 500.0000 mg | ORAL_TABLET | Freq: Two times a day (BID) | ORAL | 0 refills | Status: DC

## 2016-03-01 MED ORDER — IBUPROFEN 400 MG PO TABS
600.0000 mg | ORAL_TABLET | Freq: Once | ORAL | Status: AC
  Administered 2016-03-01: 600 mg via ORAL
  Filled 2016-03-01: qty 1

## 2016-03-01 MED ORDER — HYDROCODONE-ACETAMINOPHEN 5-325 MG PO TABS
2.0000 | ORAL_TABLET | Freq: Once | ORAL | Status: AC
  Administered 2016-03-01: 2 via ORAL
  Filled 2016-03-01: qty 2

## 2016-03-01 NOTE — ED Notes (Signed)
Declined W/C at D/C and was escorted to lobby by RN. 

## 2016-03-01 NOTE — ED Triage Notes (Signed)
Pt reports a productive cough . Pt last saw PCP 2 week ago and was treated with a Z-pack and a HHN. Pt reports not any better. Pt denies fever. Pt has productive cough  With brown to green mucous. Pt also reports the cough has been present for one year.

## 2016-03-01 NOTE — ED Triage Notes (Signed)
Pt stated she could be wasting her time some where else . Exactly how long will it be before I get my x-ray . X-ray dept called for wait time for x-ray. When the transported arrived the order for the x ray was DC

## 2016-03-01 NOTE — ED Notes (Signed)
Pt ambulated to room from waiting area. Pt complained of pain in her back and hips.

## 2016-03-01 NOTE — Discharge Instructions (Signed)
I'm sorry you have been dealing with back pain for so long, I hope the combination of medications I prescribe you today are able to relief some of your pain. Unfortunately we are unable to treat chronic pain conditions in the emergency department.   Please take naproxen, tylenol, robaxin and lidocaine patch for pain as indicated.   It is crucial that you follow up with your PCP to discuss other treatment options for long term treatment of chronic back pain, possibly a pain contract at a pain clinic.

## 2016-03-01 NOTE — ED Provider Notes (Signed)
[facility] DEPT Provider Note   CSN: [id number] Arrival date & time: 03/01/16  0801     History   Chief Complaint Chief Complaint  Patient presents with  . Cough    HPI Tina [name] is a 36 y.o. female with pertinent past medical history of cervicalgia, SI joint dysfunction, lumbar sacral spondylosis and postlaminectomy presents to the emergency department reporting diffuse back pain most significant at level of scapulae 2-3 days. Diffuse back pain exacerbated by coughing.  Patient states that she is been having a cough for the past 2 weeks, was seen by PCP who diagnosed her with bronchitis and gave her azithromycin. Patient states that her cough has improved but is still lingering and is now exacerbating her chronic back pain. Patient denies fevers, chest pain, shortness of breath, body aches, nasal congestion, runny nose. Patient states that she has a long history of chronic back pain beginning in 2007, she has been on multiple narcotic pain regimens and states only Norco helps her relieve some of the pain. Patient had a pain contract at pain clinic a few years ago however lost insurance and has since been unable to establish care up in clinic. Patient is currently seeing orthopedist for chronic back pain, has gone an MRI and is scheduled to follow up with orthopedist on 03/03/2016 for further discussion of chronic back pain and possible injections.  HPI  Past Medical History:  Diagnosis Date  . Allergic rhinitis   . Anxiety   . Chronic thoracic back pain   . GERD (gastroesophageal reflux disease)   . History of gestational diabetes   . Hyperlipidemia   . Major depression   . Menorrhagia   . Migraine   . PONV (postoperative nausea and vomiting)     Patient Active Problem List   Diagnosis Date Noted  . S/P cesarean section 08/29/2014  . Venereal disease, unspecified 06/23/2012  . Cervicalgia 04/21/2012  . 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 Postlaminectomy  syndrome, lumbar region 06/26/2011  . Lumbago 05/09/2011  . 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 Seasonal and perennial allergic rhinitis 05/08/2011  . Sacroiliac dysfunction 04/07/2011  . Lumbosacral spondylosis without myelopathy 03/10/2011  . Dizziness and giddiness 11/02/2010  . Preventative health care 04/26/2010  . SMOKER 03/29/2010  . HYPERSOMNIA 02/08/2010  . HYPERLIPIDEMIA 04/26/2009  . ADD 04/26/2009  . Pruritus ani 04/26/2009  . ANXIETY 02/01/2009  . DEPRESSION 02/01/2009  . COMMON MIGRAINE 02/01/2009  . GERD 02/01/2009  . CHOLELITHIASIS 02/01/2009    Past Surgical History:  Procedure Laterality Date  . CESAREAN SECTION  05/21/2004  . CESAREAN SECTION WITH BILATERAL TUBAL LIGATION Bilateral 08/29/2014   Procedure: CESAREAN SECTION WITH BILATERAL TUBAL LIGATION;  Surgeon: Cheri Fowler, MD;  Location: [facility];  Service: Obstetrics;  Laterality: Bilateral;  MD requests RNFA [name] confirmed 7/29...tms  . CHOLECYSTECTOMY N/A 03/26/2015   Procedure: LAPAROSCOPIC CHOLECYSTECTOMY;  Surgeon: Arta Bruce Kinsinger, MD;  Location: [facility];  Service: General;  Laterality: N/A;  . DILITATION & CURRETTAGE/HYSTROSCOPY WITH NOVASURE ABLATION N/A 01/28/2016   Procedure: DILATATION & CURETTAGE/HYSTEROSCOPY WITH NOVASURE ABLATION;  Surgeon: Cheri Fowler, MD;  Location: [facility];  Service: Gynecology;  Laterality: N/A;  . LUMBAR MICRODISCECTOMY  11/17/2007   L5-  S1  . TONSILLECTOMY  2001    OB History    Gravida Para Term Preterm AB Living   3 2 2   1 2    SAB TAB Ectopic Multiple Live Births   1  0 2       Home Medications    Prior to Admission medications   Medication Sig Start Date End Date Taking? Authorizing Provider  dexlansoprazole (DEXILANT) 60 MG capsule Take 60 mg by mouth every morning.    Historical Provider, MD  ibuprofen (ADVIL,MOTRIN) 200 MG tablet Take 200 mg by mouth every 6 (six) hours as needed.    Historical  Provider, MD  lidocaine (LIDODERM) 5 % Place 1 patch onto the skin daily. Remove & Discard patch within 12 hours or as directed by MD 03/01/16   Kinnie Feil, PA-C  methocarbamol (ROBAXIN) 500 MG tablet Take 1 tablet (500 mg total) by mouth 2 (two) times daily. 03/01/16   Kinnie Feil, PA-C  naproxen (NAPROSYN) 500 MG tablet Take 1 tablet (500 mg total) by mouth 2 (two) times daily. 03/01/16   Kinnie Feil, PA-C    Family History Family History  Problem Relation Age of Onset  . Depression Mother   . Mental illness Mother   . Diabetes Father   . Hypertension Father   . Hyperlipidemia Father   . Diabetes Maternal Grandmother     Dialysis  . Hypertension Maternal Grandmother   . Heart disease Maternal Grandmother   . Cancer Paternal Grandmother     Breast Cancer  . Cancer Paternal Grandfather     colon  . Colon cancer Paternal Grandfather   . Diabetes Brother   . Cancer Other     colon  . Kidney failure Other     dialysis  . Colon cancer Paternal Uncle   . Esophageal cancer Neg Hx   . Stomach cancer Neg Hx   . Rectal cancer Neg Hx     Social History Social History  Substance Use Topics  . Smoking status: Current Every Day Smoker    Packs/day: 0.50    Years: 20.00    Types: Cigarettes  . Smokeless tobacco: Never Used  . Alcohol use No     Allergies   Oxycodone; Tramadol; and Wellbutrin [bupropion]   Review of Systems Review of Systems  Constitutional: Negative for fever.  HENT: Negative for congestion and sore throat.   Eyes: Negative for visual disturbance.  Respiratory: Positive for cough. Negative for shortness of breath.   Cardiovascular: Negative for chest pain.  Gastrointestinal: Negative for abdominal pain, constipation, diarrhea, nausea and vomiting.  Genitourinary: Negative for difficulty urinating.  Musculoskeletal: Positive for back pain (chronic). Negative for arthralgias and gait problem.  Neurological: Negative for dizziness, weakness,  light-headedness and headaches.     Physical Exam Updated Vital Signs BP 138/96 (BP Location: Right Arm)   Pulse 69   Temp 98.4 F (36.9 C) (Oral)   Resp 15   Ht 5\' 7"  (1.702 m)   Wt 122.5 kg   LMP 01/28/2016 Comment: ablation on 02-27-17  SpO2 100%   BMI 42.29 kg/m   Physical Exam  Constitutional: She is oriented to person, place, and time. She appears well-developed and well-nourished. No distress.  NAD.  HENT:  Head: Normocephalic and atraumatic.  Right Ear: External ear normal.  Left Ear: External ear normal.  Nose: Nose normal.  Mouth/Throat: Oropharynx is clear and moist. No oropharyngeal exudate.  Moist mucous membranes.  No nasal mucosa edema. Sinuses non tender. Oropharynx and tonsils normal without edema, erythema, exudates or lesions.  Uvula midline. No trismus.   Eyes: Conjunctivae and EOM are normal. Pupils are equal, round, and reactive to light. No scleral icterus.  Neck: Normal  range of motion. Neck supple. No JVD present.  Cardiovascular: Normal rate, regular rhythm, normal heart sounds and intact distal pulses.   No murmur heard. Pulmonary/Chest:  RR within normal limits. SpO2 within normal limits.  Normal breathing effort. Patient speaking in full sentences. No pursed lip breathing. No chest wall retractions. No cyanosis. Chest wall expansion symmetric.  No chest wall tenderness. Lungs CTAB anteriorly and posteriorly without wheezing, rhonchi or crackles.  No egophony.  No JVD. No orthopnea with head of bed flat.   Abdominal: Soft. Bowel sounds are normal. There is no tenderness.  Musculoskeletal: Normal range of motion. She exhibits no deformity.  Diffuse bilateral back pain with light palpation most significant at lumbar paraspinal region and between scapulae.  Full lumbar ROM.  No midline CTL spine tenderness.   Lymphadenopathy:    She has no cervical adenopathy.  Neurological: She is alert and oriented to person, place, and time. No sensory  deficit.  Skin: Skin is warm and dry. Capillary refill takes less than 2 seconds.  Psychiatric: She has a normal mood and affect. Her behavior is normal. Judgment and thought content normal.  Nursing note and vitals reviewed.    ED Treatments / Results  Labs (all labs ordered are listed, but only abnormal results are displayed) Labs Reviewed - No data to display  EKG  EKG Interpretation None       Radiology No results found.  Procedures Procedures (including critical care time)  Medications Ordered in ED Medications  HYDROcodone-acetaminophen (NORCO/VICODIN) 5-325 MG per tablet 2 tablet (2 tablets Oral Given 03/01/16 0949)  ibuprofen (ADVIL,MOTRIN) tablet 600 mg (600 mg Oral Given 03/01/16 0949)     Initial Impression / Assessment and Plan / ED Course  I have reviewed the triage vital signs and the nursing notes.  Pertinent labs & imaging results that were available during my care of the patient were reviewed by me and considered in my medical decision making (see chart for details).    36 year old female with chronic back pain presents to the emergency department reporting cough 2 weeks exacerbating her chronic back pain. Patient currently not taking any pain medications. Patient reports long history of chronic back pain since 2007, has been on multiple narcotic pain medicines, has been to pain clinic and has had pain contract in the past. Patient recently lost medical insurance and has since been unable to establish care with pain clinic. Patient currently being followed by orthopedist for her chronic back pain, has had MRI, has follow-up appointment with orthopedist in 2 days. Patient has also been in touch with her PCP for chronic back pain.  Patient comes into the ED today reporting cough 2 weeks since diagnosis of bronchitis which was appropriately treated. Now cough is exacerbating her chronic back pain. Lung exam benign. There is diffuse bilateral back tenderness with  light palpation. This appears to be a chronic back pain exacerbation from recent cough from bronchitis. No red flag symptoms concerning for epidural abscess, cauda equina, compression fractures. Patient became teary-eyed in the emergency department, expressed her frustration regarding long-standing history of chronic back pain. Patient clarified to me that her cough is "not the problem" and that she doesn't need a chest x-ray.  She informed me that she needed something for the pain in her back as she has been unable to control it at home.  Patient was given Norco in the ED, however was not prescribed narcotic pain medications at this time. Advised patient to follow up with  PCP and establish care with pain clinic for long-term management of her chronic back pain. Patient prescribed lidocaine patch, naproxen, Tylenol, Robaxin for pain.  Final Clinical Impressions(s) / ED Diagnoses   Final diagnoses:  Chronic bilateral low back pain without sciatica    New Prescriptions Discharge Medication List as of 03/01/2016  9:45 AM    START taking these medications   Details  lidocaine (LIDODERM) 5 % Place 1 patch onto the skin daily. Remove & Discard patch within 12 hours or as directed by MD, Starting Sat 03/01/2016, Print    methocarbamol (ROBAXIN) 500 MG tablet Take 1 tablet (500 mg total) by mouth 2 (two) times daily., Starting Sat 03/01/2016, Print    naproxen (NAPROSYN) 500 MG tablet Take 1 tablet (500 mg total) by mouth 2 (two) times daily., Starting Sat 03/01/2016, Print    acetaminophen (TYLENOL 8 HOUR) 650 MG CR tablet Take 1 tablet (650 mg total) by mouth every 8 (eight) hours as needed for pain., Starting Sat 03/01/2016, Print         Kinnie Feil, PA-C 03/01/16 [place], DO 03/01/16 1515

## 2016-05-16 ENCOUNTER — Other Ambulatory Visit: Payer: Self-pay | Admitting: Otolaryngology

## 2016-05-26 NOTE — Pre-Procedure Instructions (Signed)
Tina [name]  05/26/2016      [facility] ([place]), [place] [facility] [place] [address] [place] ([place]) [place] [address] Phone: [phone] Fax: [phone]    Your procedure is scheduled on May 25  Report to [facility] at [phone].  Call this number if you have problems the morning of surgery:  [phone]   Remember:  Do not eat food or drink liquids after midnight.  Take these medicines the morning of surgery with A SIP OF WATER Tylenol if needed, albuterol inhaler if needed- bring your inhalers with you on the day of surgery, Clonazepam (Klonopin) if needed, Cyclobenzaprine (Flexeril) if needed, Methocarbamol (Robaxin) if needed  Stop taking aspirin, BC's, Goody's, Herbal medications, Fish Oil, Ibuprofen, Advil, Motrin, Aleve, Naproxen   Do not wear jewelry, make-up or nail polish.  Do not wear lotions, powders, or perfumes, or deoderant.  Do not shave 48 hours prior to surgery.  Men may shave face and neck.  Do not bring valuables to the hospital.  [facility] is not responsible for any belongings or valuables.  Contacts, dentures or bridgework may not be worn into surgery.  Leave your suitcase in the car.  After surgery it may be brought to your room.  For patients admitted to the hospital, discharge time will be determined by your treatment team.  Patients discharged the day of surgery will not be allowed to drive home.    Special instructions:  [name] - Preparing for Surgery  Before surgery, you can play an important role.  Because skin is not sterile, your skin needs to be as free of germs as possible.  You can reduce the number of germs on you skin by washing with CHG (chlorahexidine gluconate) soap before surgery.  CHG is an antiseptic cleaner which kills germs and bonds with the skin to continue killing germs even after washing.  Please DO NOT use if you have an allergy to CHG or antibacterial soaps.  If your  skin becomes reddened/irritated stop using the CHG and inform your nurse when you arrive at Short Stay.  Do not shave (including legs and underarms) for at least 48 hours prior to the first CHG shower.  You may shave your face.  Please follow these instructions carefully:   1.  Shower with CHG Soap the night before surgery and the    morning of Surgery.  2.  If you choose to wash your hair, wash your hair first as usual with your  normal shampoo.  3.  After you shampoo, rinse your hair and body thoroughly to remove the  Shampoo.  4.  Use CHG as you would any other liquid soap.  You can apply chg directly  to the skin and wash gently with scrungie or a clean washcloth.  5.  Apply the CHG Soap to your body ONLY FROM THE NECK DOWN.   Do not use on open wounds or open sores.  Avoid contact with your eyes,  ears, mouth and genitals (private parts).  Wash genitals (private parts)  with your normal soap.  6.  Wash thoroughly, paying special attention to the area where your surgery   will be performed.  7.  Thoroughly rinse your body with warm water from the neck down.  8.  DO NOT shower/wash with your normal soap after using and rinsing off the CHG Soap.  9.  Pat yourself dry with a clean towel.  10.  Wear clean pajamas.            11.  Place clean sheets on your bed the night of your first shower and do not sleep with pets.  Day of Surgery  Do not apply any lotions/deoderants the morning of surgery.  Please wear clean clothes to the hospital/surgery center.     Please read over the following fact sheets that you were given. Pain Booklet, Coughing and Deep Breathing and Surgical Site Infection Prevention

## 2016-05-27 ENCOUNTER — Ambulatory Visit (HOSPITAL_COMMUNITY)
Admission: RE | Admit: 2016-05-27 | Discharge: 2016-05-27 | Disposition: A | Discharge status: Home / Self Care | Payer: Medicaid Other | Source: Ambulatory Visit | Attending: Anesthesiology | Admitting: Anesthesiology

## 2016-05-27 ENCOUNTER — Encounter (HOSPITAL_COMMUNITY): Payer: Self-pay

## 2016-05-27 ENCOUNTER — Encounter (HOSPITAL_COMMUNITY)
Admission: RE | Admit: 2016-05-27 | Discharge: 2016-05-27 | Disposition: A | Discharge status: Home / Self Care | Payer: Medicaid Other | Source: Ambulatory Visit | Attending: Otolaryngology | Admitting: Otolaryngology

## 2016-05-27 DIAGNOSIS — E041 Nontoxic single thyroid nodule: Secondary | ICD-10-CM | POA: Insufficient documentation

## 2016-05-27 DIAGNOSIS — Z01818 Encounter for other preprocedural examination: Secondary | ICD-10-CM | POA: Diagnosis present

## 2016-05-27 HISTORY — DX: Simple chronic bronchitis: J41.0

## 2016-05-27 LAB — CBC
HCT: 35.8 % — ABNORMAL LOW (ref 36.0–46.0)
HEMOGLOBIN: 10.3 g/dL — AB (ref 12.0–15.0)
MCH: 19.9 pg — AB (ref 26.0–34.0)
MCHC: 28.8 g/dL — AB (ref 30.0–36.0)
MCV: 69.1 fL — ABNORMAL LOW (ref 78.0–100.0)
Platelets: 243 10*3/uL (ref 150–400)
RBC: 5.18 MIL/uL — ABNORMAL HIGH (ref 3.87–5.11)
RDW: 19.2 % — AB (ref 11.5–15.5)
WBC: 5.9 10*3/uL (ref 4.0–10.5)

## 2016-05-27 LAB — BASIC METABOLIC PANEL
ANION GAP: 7 (ref 5–15)
BUN: 10 mg/dL (ref 6–20)
CALCIUM: 8.8 mg/dL — AB (ref 8.9–10.3)
CO2: 24 mmol/L (ref 22–32)
Chloride: 106 mmol/L (ref 101–111)
Creatinine, Ser: 0.63 mg/dL (ref 0.44–1.00)
GFR calc Af Amer: >60 mL/min (ref >60)
GLUCOSE: 108 mg/dL — AB (ref 65–99)
Potassium: 3.9 mmol/L (ref 3.5–5.1)
Sodium: 137 mmol/L (ref 135–145)

## 2016-05-27 LAB — HCG, SERUM, QUALITATIVE: PREG SERUM: NEGATIVE

## 2016-05-27 NOTE — Progress Notes (Signed)
PCP - Gwenlyn Perking Cardiologist - denies  Chest x-ray - 05/30/16 EKG - not needed no cardiac history Stress Test - denies ECHO - denies Cardiac Cath - denies    Patient denies shortness of breath, fever, cough and chest pain at PAT appointment   Patient verbalized understanding of instructions that were given to them at the PAT appointment. Patient was also instructed that they will need to review over the PAT instructions again at home before surgery.

## 2016-06-05 ENCOUNTER — Encounter (HOSPITAL_COMMUNITY): Payer: Self-pay | Admitting: Anesthesiology

## 2016-06-05 NOTE — Anesthesia Preprocedure Evaluation (Addendum)
Anesthesia Evaluation  Patient identified by MRN, date of birth, ID band Patient awake    Reviewed: Allergy & Precautions, NPO status , Patient's Chart, lab work & pertinent test results  History of Anesthesia Complications (+) PONV and history of anesthetic complications  Airway Mallampati: II  TM Distance: >3 FB Neck ROM: Full    Dental no notable dental hx. (+) Teeth Intact   Pulmonary Current Smoker,    Pulmonary exam normal breath sounds clear to auscultation       Cardiovascular negative cardio ROS Normal cardiovascular exam Rhythm:Regular Rate:Normal     Neuro/Psych  Headaches, PSYCHIATRIC DISORDERS Anxiety    GI/Hepatic Neg liver ROS, GERD  Medicated and Controlled,  Endo/Other  Morbid obesityThyroid nodule  Renal/GU negative Renal ROS  negative genitourinary   Musculoskeletal  (+) Arthritis , Osteoarthritis,    Abdominal (+) + obese,   Peds  Hematology negative hematology ROS (+)   Anesthesia Other Findings   Reproductive/Obstetrics negative OB ROS                            Anesthesia Physical Anesthesia Plan  ASA: III  Anesthesia Plan: General   Post-op Pain Management:    Induction:   Airway Management Planned: Oral ETT  Additional Equipment:   Intra-op Plan:   Post-operative Plan: Extubation in OR  Informed Consent: I have reviewed the patients History and Physical, chart, labs and discussed the procedure including the risks, benefits and alternatives for the proposed anesthesia with the patient or authorized representative who has indicated his/her understanding and acceptance.   Dental advisory given  Plan Discussed with: Anesthesiologist, CRNA and Surgeon  Anesthesia Plan Comments:        Anesthesia Quick Evaluation

## 2016-06-06 ENCOUNTER — Observation Stay (HOSPITAL_COMMUNITY)
Admission: RE | Admit: 2016-06-06 | Discharge: 2016-06-07 | Disposition: A | Discharge status: Home / Self Care | Payer: Medicaid Other | Source: Ambulatory Visit | Attending: Otolaryngology | Admitting: Otolaryngology

## 2016-06-06 ENCOUNTER — Encounter (HOSPITAL_COMMUNITY): Payer: Self-pay | Admitting: Certified Registered Nurse Anesthetist

## 2016-06-06 ENCOUNTER — Ambulatory Visit (HOSPITAL_COMMUNITY): Payer: Medicaid Other | Admitting: Anesthesiology

## 2016-06-06 ENCOUNTER — Encounter (HOSPITAL_COMMUNITY)
Admission: RE | Disposition: A | Discharge status: Home / Self Care | Payer: Self-pay | Source: Ambulatory Visit | Attending: Otolaryngology

## 2016-06-06 DIAGNOSIS — Z803 Family history of malignant neoplasm of breast: Secondary | ICD-10-CM | POA: Diagnosis not present

## 2016-06-06 DIAGNOSIS — Z791 Long term (current) use of non-steroidal anti-inflammatories (NSAID): Secondary | ICD-10-CM | POA: Insufficient documentation

## 2016-06-06 DIAGNOSIS — Z800 Family history of malignant neoplasm of digestive organs: Secondary | ICD-10-CM | POA: Insufficient documentation

## 2016-06-06 DIAGNOSIS — Z809 Family history of malignant neoplasm, unspecified: Secondary | ICD-10-CM | POA: Diagnosis not present

## 2016-06-06 DIAGNOSIS — Z818 Family history of other mental and behavioral disorders: Secondary | ICD-10-CM | POA: Insufficient documentation

## 2016-06-06 DIAGNOSIS — Z885 Allergy status to narcotic agent status: Secondary | ICD-10-CM | POA: Insufficient documentation

## 2016-06-06 DIAGNOSIS — Z833 Family history of diabetes mellitus: Secondary | ICD-10-CM | POA: Diagnosis not present

## 2016-06-06 DIAGNOSIS — F419 Anxiety disorder, unspecified: Secondary | ICD-10-CM | POA: Insufficient documentation

## 2016-06-06 DIAGNOSIS — Z9049 Acquired absence of other specified parts of digestive tract: Secondary | ICD-10-CM | POA: Insufficient documentation

## 2016-06-06 DIAGNOSIS — F17210 Nicotine dependence, cigarettes, uncomplicated: Secondary | ICD-10-CM | POA: Insufficient documentation

## 2016-06-06 DIAGNOSIS — E041 Nontoxic single thyroid nodule: Principal | ICD-10-CM | POA: Insufficient documentation

## 2016-06-06 DIAGNOSIS — Z8249 Family history of ischemic heart disease and other diseases of the circulatory system: Secondary | ICD-10-CM | POA: Diagnosis not present

## 2016-06-06 DIAGNOSIS — D34 Benign neoplasm of thyroid gland: Secondary | ICD-10-CM | POA: Insufficient documentation

## 2016-06-06 DIAGNOSIS — E785 Hyperlipidemia, unspecified: Secondary | ICD-10-CM | POA: Insufficient documentation

## 2016-06-06 DIAGNOSIS — K219 Gastro-esophageal reflux disease without esophagitis: Secondary | ICD-10-CM | POA: Diagnosis not present

## 2016-06-06 DIAGNOSIS — Z79899 Other long term (current) drug therapy: Secondary | ICD-10-CM | POA: Diagnosis not present

## 2016-06-06 DIAGNOSIS — F329 Major depressive disorder, single episode, unspecified: Secondary | ICD-10-CM | POA: Diagnosis not present

## 2016-06-06 HISTORY — PX: THYROIDECTOMY: SHX17

## 2016-06-06 HISTORY — DX: Nontoxic single thyroid nodule: E04.1

## 2016-06-06 HISTORY — PX: BIOPSY THYROID: PRO38

## 2016-06-06 SURGERY — THYROIDECTOMY
Anesthesia: General | Site: Neck | Laterality: Left

## 2016-06-06 MED ORDER — ROCURONIUM BROMIDE 10 MG/ML (PF) SYRINGE
PREFILLED_SYRINGE | INTRAVENOUS | Status: AC
  Filled 2016-06-06: qty 5

## 2016-06-06 MED ORDER — IBUPROFEN 600 MG PO TABS
600.0000 mg | ORAL_TABLET | Freq: Four times a day (QID) | ORAL | Status: DC | PRN
  Administered 2016-06-06: 600 mg via ORAL
  Filled 2016-06-06: qty 1

## 2016-06-06 MED ORDER — DIPHENHYDRAMINE HCL 50 MG/ML IJ SOLN: 12.5000 mg | Freq: Four times a day (QID) | INTRAMUSCULAR | Status: DC | PRN

## 2016-06-06 MED ORDER — DULOXETINE HCL 30 MG PO CPEP
30.0000 mg | ORAL_CAPSULE | Freq: Every day | ORAL | Status: DC
  Administered 2016-06-06: 30 mg via ORAL
  Filled 2016-06-06: qty 1

## 2016-06-06 MED ORDER — LACTATED RINGERS IV SOLN
INTRAVENOUS | Status: DC | PRN
  Administered 2016-06-06 (×2): via INTRAVENOUS

## 2016-06-06 MED ORDER — DEXAMETHASONE SODIUM PHOSPHATE 10 MG/ML IJ SOLN
INTRAMUSCULAR | Status: DC | PRN
  Administered 2016-06-06: 10 mg via INTRAVENOUS

## 2016-06-06 MED ORDER — MIDAZOLAM HCL 5 MG/5ML IJ SOLN
INTRAMUSCULAR | Status: DC | PRN
  Administered 2016-06-06: 2 mg via INTRAVENOUS

## 2016-06-06 MED ORDER — PANTOPRAZOLE SODIUM 40 MG PO TBEC
40.0000 mg | DELAYED_RELEASE_TABLET | Freq: Every day | ORAL | Status: DC
  Administered 2016-06-06 – 2016-06-07 (×2): 40 mg via ORAL
  Filled 2016-06-06 (×2): qty 1

## 2016-06-06 MED ORDER — CEFAZOLIN SODIUM-DEXTROSE 2-3 GM-% IV SOLR
INTRAVENOUS | Status: DC | PRN
  Administered 2016-06-06: 2 g via INTRAVENOUS

## 2016-06-06 MED ORDER — PHENYLEPHRINE HCL 10 MG/ML IJ SOLN
INTRAVENOUS | Status: DC | PRN
  Administered 2016-06-06: 20 ug/min via INTRAVENOUS

## 2016-06-06 MED ORDER — PROPOFOL 10 MG/ML IV BOLUS
INTRAVENOUS | Status: AC
  Filled 2016-06-06: qty 20

## 2016-06-06 MED ORDER — MEPERIDINE HCL 25 MG/ML IJ SOLN: 6.2500 mg | INTRAMUSCULAR | Status: DC | PRN

## 2016-06-06 MED ORDER — SCOPOLAMINE 1 MG/3DAYS TD PT72
1.0000 | MEDICATED_PATCH | TRANSDERMAL | Status: DC
  Administered 2016-06-06: 1 via TRANSDERMAL
  Filled 2016-06-06: qty 1

## 2016-06-06 MED ORDER — LIDOCAINE-EPINEPHRINE 1 %-1:100000 IJ SOLN
INTRAMUSCULAR | Status: AC
  Filled 2016-06-06: qty 1

## 2016-06-06 MED ORDER — CHLORHEXIDINE GLUCONATE CLOTH 2 % EX PADS: 6.0000 | MEDICATED_PAD | Freq: Once | CUTANEOUS | Status: DC

## 2016-06-06 MED ORDER — PROMETHAZINE HCL 25 MG/ML IJ SOLN
6.2500 mg | INTRAMUSCULAR | Status: DC | PRN
  Administered 2016-06-06: 12.5 mg via INTRAVENOUS

## 2016-06-06 MED ORDER — LIDOCAINE 2% (20 MG/ML) 5 ML SYRINGE
INTRAMUSCULAR | Status: AC
  Filled 2016-06-06: qty 5

## 2016-06-06 MED ORDER — HYDROMORPHONE HCL 1 MG/ML IJ SOLN: 0.2500 mg | INTRAMUSCULAR | Status: DC | PRN

## 2016-06-06 MED ORDER — KCL IN DEXTROSE-NACL 20-5-0.45 MEQ/L-%-% IV SOLN
INTRAVENOUS | Status: DC
  Administered 2016-06-06: 13:00:00 via INTRAVENOUS
  Filled 2016-06-06 (×2): qty 1000

## 2016-06-06 MED ORDER — 0.9 % SODIUM CHLORIDE (POUR BTL) OPTIME
TOPICAL | Status: DC | PRN
  Administered 2016-06-06: 1000 mL

## 2016-06-06 MED ORDER — PROMETHAZINE HCL 25 MG/ML IJ SOLN
INTRAMUSCULAR | Status: AC
  Filled 2016-06-06: qty 1

## 2016-06-06 MED ORDER — CYCLOBENZAPRINE HCL 10 MG PO TABS
10.0000 mg | ORAL_TABLET | Freq: Every evening | ORAL | Status: DC | PRN
  Filled 2016-06-06: qty 1

## 2016-06-06 MED ORDER — CEFAZOLIN SODIUM-DEXTROSE 2-4 GM/100ML-% IV SOLN
INTRAVENOUS | Status: AC
  Filled 2016-06-06: qty 100

## 2016-06-06 MED ORDER — HYDROCODONE-ACETAMINOPHEN 5-325 MG PO TABS
1.0000 | ORAL_TABLET | ORAL | Status: DC | PRN
  Administered 2016-06-06: 1 via ORAL
  Administered 2016-06-07: 2 via ORAL
  Administered 2016-06-07: 1 via ORAL
  Filled 2016-06-06: qty 2
  Filled 2016-06-06: qty 1
  Filled 2016-06-06: qty 2

## 2016-06-06 MED ORDER — FLUOXETINE HCL 20 MG PO CAPS
40.0000 mg | ORAL_CAPSULE | Freq: Every day | ORAL | Status: DC
Start: 2016-06-06 — End: 2016-06-07
  Administered 2016-06-06: 40 mg via ORAL
  Filled 2016-06-06: qty 2

## 2016-06-06 MED ORDER — ACETAMINOPHEN 650 MG RE SUPP: 650.0000 mg | RECTAL | Status: DC | PRN

## 2016-06-06 MED ORDER — ROCURONIUM BROMIDE 10 MG/ML (PF) SYRINGE
PREFILLED_SYRINGE | INTRAVENOUS | Status: DC | PRN
  Administered 2016-06-06: 80 mg via INTRAVENOUS
  Administered 2016-06-06: 20 mg via INTRAVENOUS

## 2016-06-06 MED ORDER — ONDANSETRON HCL 4 MG/2ML IJ SOLN
INTRAMUSCULAR | Status: DC | PRN
  Administered 2016-06-06: 4 mg via INTRAVENOUS

## 2016-06-06 MED ORDER — ALBUTEROL SULFATE (2.5 MG/3ML) 0.083% IN NEBU: 2.5000 mg | INHALATION_SOLUTION | RESPIRATORY_TRACT | Status: DC | PRN

## 2016-06-06 MED ORDER — NEOSTIGMINE METHYLSULFATE 10 MG/10ML IV SOLN
INTRAVENOUS | Status: DC | PRN
  Administered 2016-06-06: 5 mg via INTRAVENOUS

## 2016-06-06 MED ORDER — DEXAMETHASONE SODIUM PHOSPHATE 10 MG/ML IJ SOLN
INTRAMUSCULAR | Status: AC
  Filled 2016-06-06: qty 1

## 2016-06-06 MED ORDER — MIDAZOLAM HCL 2 MG/2ML IJ SOLN
INTRAMUSCULAR | Status: AC
  Filled 2016-06-06: qty 2

## 2016-06-06 MED ORDER — METHOCARBAMOL 500 MG PO TABS: 500.0000 mg | ORAL_TABLET | Freq: Two times a day (BID) | ORAL | Status: DC | PRN

## 2016-06-06 MED ORDER — ONDANSETRON HCL 4 MG/2ML IJ SOLN
INTRAMUSCULAR | Status: AC
  Filled 2016-06-06: qty 2

## 2016-06-06 MED ORDER — MORPHINE SULFATE (PF) 4 MG/ML IV SOLN
4.0000 mg | Freq: Once | INTRAVENOUS | Status: AC
  Administered 2016-06-06: 4 mg via INTRAVENOUS
  Filled 2016-06-06: qty 1

## 2016-06-06 MED ORDER — ACETAMINOPHEN 160 MG/5ML PO SOLN
650.0000 mg | ORAL | Status: DC | PRN
  Administered 2016-06-06: 650 mg via ORAL
  Filled 2016-06-06: qty 20.3

## 2016-06-06 MED ORDER — HEMOSTATIC AGENTS (NO CHARGE) OPTIME
TOPICAL | Status: DC | PRN
  Administered 2016-06-06: 1

## 2016-06-06 MED ORDER — FENTANYL CITRATE (PF) 250 MCG/5ML IJ SOLN
INTRAMUSCULAR | Status: AC
  Filled 2016-06-06: qty 5

## 2016-06-06 MED ORDER — FENTANYL CITRATE (PF) 100 MCG/2ML IJ SOLN
INTRAMUSCULAR | Status: DC | PRN
  Administered 2016-06-06: 150 ug via INTRAVENOUS
  Administered 2016-06-06 (×2): 50 ug via INTRAVENOUS

## 2016-06-06 MED ORDER — CLONAZEPAM 0.5 MG PO TABS
0.5000 mg | ORAL_TABLET | Freq: Two times a day (BID) | ORAL | Status: DC | PRN
  Filled 2016-06-06: qty 1

## 2016-06-06 MED ORDER — LIDOCAINE-EPINEPHRINE 1 %-1:100000 IJ SOLN
INTRAMUSCULAR | Status: DC | PRN
  Administered 2016-06-06: 4 mL

## 2016-06-06 MED ORDER — NEOSTIGMINE METHYLSULFATE 5 MG/5ML IV SOSY
PREFILLED_SYRINGE | INTRAVENOUS | Status: AC
  Filled 2016-06-06: qty 5

## 2016-06-06 MED ORDER — PROMETHAZINE HCL 25 MG/ML IJ SOLN
12.5000 mg | Freq: Four times a day (QID) | INTRAMUSCULAR | Status: DC | PRN
  Administered 2016-06-06: 12.5 mg via INTRAVENOUS
  Filled 2016-06-06: qty 1

## 2016-06-06 MED ORDER — LIDOCAINE 2% (20 MG/ML) 5 ML SYRINGE
INTRAMUSCULAR | Status: DC | PRN
  Administered 2016-06-06: 60 mg via INTRAVENOUS

## 2016-06-06 MED ORDER — PROPOFOL 10 MG/ML IV BOLUS
INTRAVENOUS | Status: DC | PRN
  Administered 2016-06-06: 140 mg via INTRAVENOUS

## 2016-06-06 MED ORDER — GLYCOPYRROLATE 0.2 MG/ML IJ SOLN
INTRAMUSCULAR | Status: DC | PRN
  Administered 2016-06-06: .8 mg via INTRAVENOUS

## 2016-06-06 SURGICAL SUPPLY — 52 items
ADH SKN CLS APL DERMABOND .7 (GAUZE/BANDAGES/DRESSINGS) ×1
BLADE SURG 15 STRL LF DISP TIS (BLADE) IMPLANT
BLADE SURG 15 STRL SS (BLADE)
CANISTER SUCT 3000ML PPV (MISCELLANEOUS) ×3 IMPLANT
CLEANER TIP ELECTROSURG 2X2 (MISCELLANEOUS) ×3 IMPLANT
CONT SPEC 4OZ CLIKSEAL STRL BL (MISCELLANEOUS) ×4 IMPLANT
CORDS BIPOLAR (ELECTRODE) ×3 IMPLANT
COVER SURGICAL LIGHT HANDLE (MISCELLANEOUS) ×3 IMPLANT
CRADLE DONUT ADULT HEAD (MISCELLANEOUS) ×2 IMPLANT
DERMABOND ADVANCED (GAUZE/BANDAGES/DRESSINGS) ×2
DERMABOND ADVANCED .7 DNX12 (GAUZE/BANDAGES/DRESSINGS) ×1 IMPLANT
DRAIN JACKSON RD 7FR 3/32 (WOUND CARE) IMPLANT
DRAIN SNY 10 ROU (WOUND CARE) IMPLANT
DRAPE HALF SHEET 40X57 (DRAPES) IMPLANT
DRAPE INCISE 23X17 IOBAN STRL (DRAPES) ×2
DRAPE INCISE 23X17 STRL (DRAPES) IMPLANT
DRAPE INCISE IOBAN 23X17 STRL (DRAPES) ×1 IMPLANT
ELECT COATED BLADE 2.86 ST (ELECTRODE) ×3 IMPLANT
ELECT REM PT RETURN 9FT ADLT (ELECTROSURGICAL) ×3
ELECTRODE REM PT RTRN 9FT ADLT (ELECTROSURGICAL) ×1 IMPLANT
EVACUATOR SILICONE 100CC (DRAIN) ×1 IMPLANT
FORCEPS BIPOLAR SPETZLER 8 1.0 (NEUROSURGERY SUPPLIES) ×3 IMPLANT
GAUZE SPONGE 4X4 16PLY XRAY LF (GAUZE/BANDAGES/DRESSINGS) ×3 IMPLANT
GLOVE BIO SURGEON STRL SZ7.5 (GLOVE) ×3 IMPLANT
GLOVE BIOGEL PI IND STRL 7.0 (GLOVE) IMPLANT
GLOVE BIOGEL PI INDICATOR 7.0 (GLOVE) ×2
GLOVE ECLIPSE 6.5 STRL STRAW (GLOVE) ×2 IMPLANT
GLOVE ECLIPSE 8.0 STRL XLNG CF (GLOVE) ×2 IMPLANT
GOWN STRL REUS W/ TWL LRG LVL3 (GOWN DISPOSABLE) ×2 IMPLANT
GOWN STRL REUS W/TWL LRG LVL3 (GOWN DISPOSABLE) ×3
HEMOSTAT SURGICEL 2X14 (HEMOSTASIS) ×2 IMPLANT
KIT BASIN OR (CUSTOM PROCEDURE TRAY) ×3 IMPLANT
KIT ROOM TURNOVER OR (KITS) ×3 IMPLANT
LOCATOR NERVE 3 VOLT (DISPOSABLE) IMPLANT
NDL BIOPSY 14X6 SOFT TISS (NEEDLE) IMPLANT
NDL HYPO 25GX1X1/2 BEV (NEEDLE) ×1 IMPLANT
NEEDLE BIOPSY 14X6 SOFT TISS (NEEDLE) ×3 IMPLANT
NEEDLE HYPO 25GX1X1/2 BEV (NEEDLE) ×3 IMPLANT
NS IRRIG 1000ML POUR BTL (IV SOLUTION) ×3 IMPLANT
PAD ARMBOARD 7.5X6 YLW CONV (MISCELLANEOUS) ×4 IMPLANT
PENCIL BUTTON HOLSTER BLD 10FT (ELECTRODE) ×3 IMPLANT
SHEARS HARMONIC 9CM CVD (BLADE) ×3 IMPLANT
SPONGE INTESTINAL PEANUT (DISPOSABLE) IMPLANT
STAPLER VISISTAT 35W (STAPLE) ×3 IMPLANT
SUT ETHILON 2 0 FS 18 (SUTURE) ×3 IMPLANT
SUT SILK 3 0 REEL (SUTURE) ×3 IMPLANT
SUT VIC AB 3-0 SH 27 (SUTURE) ×3
SUT VIC AB 3-0 SH 27X BRD (SUTURE) ×1 IMPLANT
SUT VICRYL 4-0 PS2 18IN ABS (SUTURE) ×3 IMPLANT
TOWEL OR 17X24 6PK STRL BLUE (TOWEL DISPOSABLE) ×3 IMPLANT
TRAY ENT MC OR (CUSTOM PROCEDURE TRAY) ×3 IMPLANT
TUBE FEEDING 10FR FLEXIFLO (MISCELLANEOUS) IMPLANT

## 2016-06-06 NOTE — Progress Notes (Signed)
Received pt from PACU, A&O x4. Anterior neck incision w/ skin glue dry and intact. Pt denies pain at this time.

## 2016-06-06 NOTE — Op Note (Deleted)
  The note originally documented on this encounter has been moved the the encounter in which it belongs.  

## 2016-06-06 NOTE — Brief Op Note (Signed)
06/06/2016  9:33 AM  PATIENT:  Tina [name]  36 y.o. female  PRE-OPERATIVE DIAGNOSIS:  THYROID NODULE  POST-OPERATIVE DIAGNOSIS:  THYROID NODULE  PROCEDURE:  Procedure(s): LEFT THYROIDECTOMY AND CORE BIOPSY OF RIGHT THYROID (Left)  SURGEON:  Surgeon(s) and Role:    * Melida Quitter, MD - Primary  PHYSICIAN ASSISTANT:   ASSISTANTS: none   ANESTHESIA:   general  EBL:  Total I/O In: 1000 [I.V.:1000] Out: 20 [Blood:20]  BLOOD ADMINISTERED:none  DRAINS: none   LOCAL MEDICATIONS USED:  LIDOCAINE   SPECIMEN:  Source of Specimen:  Left thyroid lobe, core biopsy of right lobe nodule  DISPOSITION OF SPECIMEN:  PATHOLOGY  COUNTS:  YES  TOURNIQUET:  * No tourniquets in log *  DICTATION: .Other Dictation: Dictation Number [id number]  PLAN OF CARE: Admit for overnight observation  PATIENT DISPOSITION:  PACU - hemodynamically stable.   Delay start of Pharmacological VTE agent (>24hrs) due to surgical blood loss or risk of bleeding: yes

## 2016-06-06 NOTE — Progress Notes (Signed)
   Subjective:    Patient ID: Tina [name], female    DOB: [birth year], 36 y.o.   MRN: [id number]  HPI She complains of neck soreness and headache but is doing well otherwise.    Review of Systems     Objective:   Physical Exam Alert, NAD. Normal voice. Thyroid incision clean and intact with no fluid collection.     Assessment & Plan:  S/p left thyroid lobectomy  Doing well.  Will observe overnight.  She says that she can take hydrocodone without trouble so this will be made available.

## 2016-06-06 NOTE — Op Note (Signed)
NAMEIMARI, REEN NO.:  [id number]  MEDICAL RECORD NO.:  [id number]  LOCATION:                               FACILITY:  [facility]  PHYSICIAN:  Onnie Graham, MD     DATE OF BIRTH:  1980-12-14  DATE OF PROCEDURE:  06/06/2016 DATE OF DISCHARGE:                              OPERATIVE REPORT   PREOPERATIVE DIAGNOSES:  Left thyroid nodule and small right thyroid nodule.  POSTOPERATIVE DIAGNOSES:  Left thyroid nodule and small right thyroid nodule.  PROCEDURES: 1. Left thyroid lobectomy. 2. Core needle biopsy of right thyroid nodule.  SURGEON:  Onnie Graham, MD  ANESTHESIA:  General endotracheal anesthesia.  COMPLICATIONS:  None.  INDICATION:  The patient is a 36 year old female who has been aware of a left-sided thyroid nodule for over a year that is fairly sizable and seems to be causing obstructive symptoms.  Fine-needle aspiration biopsy was benign in the past, however, she has requested removal of the nodule due to symptoms.  She was found to have a smaller nodule in the right thyroid lobe.  She presents to the operating room for surgical management.  FINDINGS:  There was an approximately 3 cm round and firm nodule of the left inferior thyroid lobe and approximately 1 cm nodule was also palpated in the right superior thyroid lobe.  A core needle biopsy was performed of the right nodule and the left lobe was removed.  Frozen section results on both lesions were follicular lesions.  DESCRIPTION OF PROCEDURE:  The patient was identified in the holding room and informed consent having been obtained including discussion of risks, benefits, alternatives, the patient was brought to the operative suite and put on the operative table in supine position.  Anesthesia was induced and the patient was intubated by Anesthesia team without difficulty.  The patient was then given intravenous antibiotics during the case.  A shoulder roll was placed and the chest  was taped down.  The neck was prepped and draped in sterile fashion.  Incision was marked with a marking pen and injected with 1% lidocaine with 1:100,000 epinephrine.  Incision was made to the skin using a 15 blade scalpel and extended through subcutaneous and platysmal layers using Bovie electrocautery.  Dissection continued then down to the midline raphae which was divided and strap muscles were retracted over the left thyroid lobe with careful dissection being performed around the capsule of the lobe.  Dissection was performed around the superior pedicle and this was divided using the Harmonic scalpel.  Dissection was performed then around the periphery of the lobe including the large nodule inferiorly, dividing tissues with the Harmonic scalpel.  The lobe was retracted inferiorly using an Allis clamp and dissection was performed around the deep and lateral portions of the lobe, staying along the capsule and using Harmonic scalpel.  The recurrent laryngeal nerve was identified from a superior direction.  The inferior lobe was then dissected in the same fashion, staying around the capsule of the gland and nodule. Dissection was then performed laterally and around the deep surface of the midportion of the lobe, keeping the recurrent nerve in view and dissecting carefully with the Harmonic scalpel  toward [employer] ligament. The midline of the gland was then divided using Bovie electrocautery and Berry ligament was then divided on the left side, removing the thyroid lobe.  This was passed to Nursing for pathology and sent for frozen section.  The left thyroid bed was then carefully inspected and bleeding controlled with bipolar electrocautery.  The patient was given a Valsalva and there was no additional bleeding seen.  The strap muscles were then dissected off the right thyroid lobe, exposing the superficial surface.  The lobe was carefully palpated and a small nodule was palpable superiorly  in a lateral position.  Three core needle biopsies were taken from the nodule and sent for frozen section as well. Bleeding was self-limited.  While awaiting for results, the surgical bed was copiously irrigated with saline.  A couple of pieces of Surgicel were placed in the left thyroid bed.  The platysma layer was closed with 3-0 Vicryl suture in a simple interrupted fashion.  The skin layer was closed using 4-0 Vicryl suture in a simple interrupted fashion in the subcutaneous layer.  When results returned with follicular lesions, Dermabond was placed on the skin.  The patient was cleaned off and returned to Anesthesia for wake-up.  During extubation, pressure was held against the neck.  She was extubated and moved to the recovery room in stable condition.     Onnie Graham, MD   ______________________________ Onnie Graham, MD    DDB/MEDQ  D:  06/06/2016  T:  06/06/2016  Job:  [id number]

## 2016-06-06 NOTE — Anesthesia Procedure Notes (Signed)
Procedure Name: Intubation Date/Time: 06/06/2016 7:44 AM Performed by: Rejeana Brock L Pre-anesthesia Checklist: Patient identified, Emergency Drugs available, Suction available and Patient being monitored Patient Re-evaluated:Patient Re-evaluated prior to inductionOxygen Delivery Method: Circle System Utilized Preoxygenation: Pre-oxygenation with 100% oxygen Intubation Type: IV induction Ventilation: Mask ventilation without difficulty Laryngoscope Size: Mac and 3 Grade View: Grade I Tube type: Oral Tube size: 7.5 mm Number of attempts: 1 Airway Equipment and Method: Stylet and Oral airway Placement Confirmation: ETT inserted through vocal cords under direct vision,  positive ETCO2 and breath sounds checked- equal and bilateral Secured at: 22 cm Tube secured with: Tape Dental Injury: Teeth and Oropharynx as per pre-operative assessment

## 2016-06-06 NOTE — Transfer of Care (Signed)
Immediate Anesthesia Transfer of Care Note  Patient: Tina [name]  Procedure(s) Performed: Procedure(s): LEFT THYROIDECTOMY AND CORE BIOPSY OF RIGHT THYROID (Left)  Patient Location: PACU  Anesthesia Type:General  Level of Consciousness: awake and alert   Airway & Oxygen Therapy: Patient Spontanous Breathing and Patient connected to nasal cannula oxygen  Post-op Assessment: Report given to RN, Post -op Vital signs reviewed and stable and Patient moving all extremities X 4  Post vital signs: Reviewed and stable  Last Vitals:  Vitals:   06/06/16 0551 06/06/16 0945  BP: (!) 114/57   Pulse: 69   Resp: 20   Temp: 36.7 C (P) 36.6 C    Last Pain:  Vitals:   06/06/16 0551  TempSrc: Oral      Patients Stated Pain Goal: (P) 0 (63/01/60 1093)  Complications: No apparent anesthesia complications

## 2016-06-06 NOTE — H&P (Signed)
Tina [name] is an 36 y.o. female.   Chief Complaint: Thyroid nodule HPI: 36 year old female with left thyroid nodule for over a year that seems to be associated with a feeling of fullness in the neck with swallowing.  FNA was benign.  She requests surgical removal due to symptoms.  Past Medical History:  Diagnosis Date  . Allergic rhinitis   . Anxiety   . Chronic thoracic back pain   . GERD (gastroesophageal reflux disease)   . History of gestational diabetes   . Hyperlipidemia   . Major depression   . Menorrhagia   . Migraine   . PONV (postoperative nausea and vomiting)   . Smokers' cough ([place])    albuterol (PROVENTIL HFA;VENTOLIN HFA)    Past Surgical History:  Procedure Laterality Date  . CESAREAN SECTION  05/21/2004  . CESAREAN SECTION WITH BILATERAL TUBAL LIGATION Bilateral 08/29/2014   Procedure: CESAREAN SECTION WITH BILATERAL TUBAL LIGATION;  Surgeon: Cheri Fowler, MD;  Location: [facility];  Service: Obstetrics;  Laterality: Bilateral;  MD requests RNFA [name] confirmed 7/29...tms  . CHOLECYSTECTOMY N/A 03/26/2015   Procedure: LAPAROSCOPIC CHOLECYSTECTOMY;  Surgeon: Arta Bruce Kinsinger, MD;  Location: [facility];  Service: General;  Laterality: N/A;  . DILITATION & CURRETTAGE/HYSTROSCOPY WITH NOVASURE ABLATION N/A 01/28/2016   Procedure: DILATATION & CURETTAGE/HYSTEROSCOPY WITH NOVASURE ABLATION;  Surgeon: Cheri Fowler, MD;  Location: [facility];  Service: Gynecology;  Laterality: N/A;  . LUMBAR MICRODISCECTOMY  11/17/2007   L5-  S1  . TONSILLECTOMY  2001  . TUBAL LIGATION      Family History  Problem Relation Age of Onset  . Depression Mother   . Mental illness Mother   . Diabetes Father   . Hypertension Father   . Hyperlipidemia Father   . Diabetes Maternal Grandmother        Dialysis  . Hypertension Maternal Grandmother   . Heart disease Maternal Grandmother   . Cancer Paternal Grandmother        Breast Cancer  . Cancer Paternal  Grandfather        colon  . Colon cancer Paternal Grandfather   . Diabetes Brother   . Cancer Other        colon  . Kidney failure Other        dialysis  . Colon cancer Paternal Uncle   . Esophageal cancer Neg Hx   . Stomach cancer Neg Hx   . Rectal cancer Neg Hx    Social History:  reports that she has been smoking Cigarettes.  She has a 10.00 pack-year smoking history. She has never used smokeless tobacco. She reports that she does not drink alcohol or use drugs.  Allergies:  Allergies  Allergen Reactions  . Oxycodone Itching    SEVERE (percocet)  . Tramadol Other (See Comments)    "Trigger's migraine's"  . Wellbutrin [Bupropion] Hives    Medications Prior to Admission  Medication Sig Dispense Refill  . acetaminophen (TYLENOL) 650 MG CR tablet Take 1,300 mg by mouth every 8 (eight) hours as needed for pain.    Marland Kitchen albuterol (PROVENTIL HFA;VENTOLIN HFA) 108 (90 Base) MCG/ACT inhaler Inhale 2 puffs into the lungs every 4 (four) hours as needed for wheezing or shortness of breath.     . clonazePAM (KLONOPIN) 0.5 MG tablet Take 0.5 mg by mouth 2 (two) times daily as needed for anxiety.    . cyclobenzaprine (FLEXERIL) 10 MG tablet Take 10 mg by mouth at bedtime as  needed for muscle spasms.    Marland Kitchen dexlansoprazole (DEXILANT) 60 MG capsule Take 60 mg by mouth at bedtime.     . DULoxetine (CYMBALTA) 30 MG capsule Take 30 mg by mouth at bedtime.    Marland Kitchen FLUoxetine (PROZAC) 20 MG tablet Take 40 mg by mouth at bedtime.     . methocarbamol (ROBAXIN) 500 MG tablet Take 1 tablet (500 mg total) by mouth 2 (two) times daily. (Patient taking differently: Take 500 mg by mouth daily as needed for muscle spasms. ) 20 tablet 0  . lidocaine (LIDODERM) 5 % Place 1 patch onto the skin daily. Remove & Discard patch within 12 hours or as directed by MD (Patient not taking: Reported on 05/23/2016) 15 patch 0  . naproxen (NAPROSYN) 500 MG tablet Take 1 tablet (500 mg total) by mouth 2 (two) times daily. (Patient  not taking: Reported on 05/23/2016) 30 tablet 0    No results found for this or any previous visit (from the past 48 hour(s)). No results found.  Review of Systems  Respiratory:       Right chest wall pain.  All other systems reviewed and are negative.   Blood pressure (!) 114/57, pulse 69, temperature 98 F (36.7 C), temperature source Oral, resp. rate 20, last menstrual period 05/26/2016, SpO2 100 %, not currently breastfeeding. Physical Exam  Constitutional: She is oriented to person, place, and time. She appears well-developed and well-nourished. No distress.  HENT:  Head: Normocephalic and atraumatic.  Right Ear: External ear normal.  Left Ear: External ear normal.  Nose: Nose normal.  Mouth/Throat: Oropharynx is clear and moist.  Eyes: Conjunctivae and EOM are normal. Pupils are equal, round, and reactive to light.  Neck: Normal range of motion. Neck supple. Thyromegaly: Left thyroid nodule palpable.  Cardiovascular: Normal rate.   Respiratory: Effort normal.  Musculoskeletal: Normal range of motion.  Neurological: She is alert and oriented to person, place, and time. No cranial nerve deficit.  Skin: Skin is warm and dry.  Psychiatric: She has a normal mood and affect. Her behavior is normal. Judgment and thought content normal.     Assessment/Plan Left thyroid nodule, symptomatic To OR for left thyroid lobectomy, possible total.  Redmond Baseman, Giang Hemme, MD 06/06/2016, 7:26 AM

## 2016-06-06 NOTE — Anesthesia Postprocedure Evaluation (Signed)
Anesthesia Post Note  Patient: Tina [name]  Procedure(s) Performed: Procedure(s) (LRB): LEFT THYROIDECTOMY AND CORE BIOPSY OF RIGHT THYROID (Left)  Patient location during evaluation: PACU Anesthesia Type: General Level of consciousness: awake and alert and oriented Pain management: pain level controlled Vital Signs Assessment: post-procedure vital signs reviewed and stable Respiratory status: spontaneous breathing, nonlabored ventilation, respiratory function stable and patient connected to nasal cannula oxygen Cardiovascular status: blood pressure returned to baseline and stable Postop Assessment: no signs of nausea or vomiting Anesthetic complications: no       Last Vitals:  Vitals:   06/06/16 1028 06/06/16 1030  BP: 109/63   Pulse: (!) 58 (!) 58  Resp: 12 19  Temp:      Last Pain:  Vitals:   06/06/16 0551  TempSrc: Oral                 Atalia Litzinger A.

## 2016-06-07 ENCOUNTER — Encounter (HOSPITAL_COMMUNITY): Payer: Self-pay | Admitting: Otolaryngology

## 2016-06-07 DIAGNOSIS — E041 Nontoxic single thyroid nodule: Secondary | ICD-10-CM | POA: Diagnosis not present

## 2016-06-07 MED ORDER — HYDROCODONE-ACETAMINOPHEN 5-325 MG PO TABS: 1.0000 | ORAL_TABLET | ORAL | 0 refills | Status: DC | PRN

## 2016-06-07 NOTE — Progress Notes (Signed)
Pt discharge going home, no active bleeding on wound site, given prescription for Vicodin, explained the meds and next dose, next appointment, given all personal belongings, removed peripheral IV, accompanied by Rise Paganini, Nurse tech via wheelchair, given vicodin 2 tab prior to discharge, no s/s of distress noted.

## 2016-06-07 NOTE — Discharge Summary (Signed)
Physician Discharge Summary  Patient ID: Tina [name] MRN: [id number] DOB/AGE: 1980/06/11 36 y.o.  Admit date: 06/06/2016 Discharge date: 06/07/2016  Admission Diagnoses: Thyroid nodule  Discharge Diagnoses:  Active Problems:   Thyroid nodule   Discharged Condition: good  Hospital Course: 36 year old female with symptomatic left thyroid nodule presented for surgical management.  See operative note.  She was observed overnight with some headache and nausea after surgery.  By POD 1, she felt much better and is eating and drinking well.  She is felt stable for discharge.  Consults: None  Significant Diagnostic Studies: None  Treatments: surgery: Left thyroid lobectomy, right thyroid nodule core needle biopsy  Discharge Exam: Blood pressure 111/70, pulse (!) 58, temperature 98 F (36.7 C), temperature source Oral, resp. rate 18, last menstrual period 05/26/2016, SpO2 98 %, not currently breastfeeding. General appearance: alert, cooperative and no distress Neck: normal voice, thyroid incision clean and intact, mild fullness deep to incision  Disposition: 01-Home or Self Care  Discharge Instructions    Diet - low sodium heart healthy    Complete by:  As directed    Discharge instructions    Complete by:  As directed    Avoid strenuous activity.  Normal diet.  Keep head somewhat elevated when resting.  OK to allow incision to get wet, gently pat dry.  Do not apply ointment to incision.  Call with fever, significant swelling, or worsening redness.   Increase activity slowly    Complete by:  As directed      Allergies as of 06/07/2016      Reactions   Oxycodone Itching   SEVERE (percocet)   Tramadol Other (See Comments)   "Trigger's migraine's"   Wellbutrin [bupropion] Hives      Medication List    TAKE these medications   acetaminophen 650 MG CR tablet Commonly known as:  TYLENOL Take 1,300 mg by mouth every 8 (eight) hours as needed for pain.   albuterol 108 (90  Base) MCG/ACT inhaler Commonly known as:  PROVENTIL HFA;VENTOLIN HFA Inhale 2 puffs into the lungs every 4 (four) hours as needed for wheezing or shortness of breath.   clonazePAM 0.5 MG tablet Commonly known as:  KLONOPIN Take 0.5 mg by mouth 2 (two) times daily as needed for anxiety.   cyclobenzaprine 10 MG tablet Commonly known as:  FLEXERIL Take 10 mg by mouth at bedtime as needed for muscle spasms.   DEXILANT 60 MG capsule Generic drug:  dexlansoprazole Take 60 mg by mouth at bedtime.   DULoxetine 30 MG capsule Commonly known as:  CYMBALTA Take 30 mg by mouth at bedtime.   FLUoxetine 20 MG tablet Commonly known as:  PROZAC Take 40 mg by mouth at bedtime.   HYDROcodone-acetaminophen 5-325 MG tablet Commonly known as:  NORCO/VICODIN Take 1-2 tablets by mouth every 4 (four) hours as needed for moderate pain or severe pain.   lidocaine 5 % Commonly known as:  LIDODERM Place 1 patch onto the skin daily. Remove & Discard patch within 12 hours or as directed by MD   methocarbamol 500 MG tablet Commonly known as:  ROBAXIN Take 1 tablet (500 mg total) by mouth 2 (two) times daily. What changed:  when to take this  reasons to take this   naproxen 500 MG tablet Commonly known as:  NAPROSYN Take 1 tablet (500 mg total) by mouth 2 (two) times daily.        SignedRedmond Baseman, Chenay Nesmith 06/07/2016, 9:37 AM

## 2016-10-04 ENCOUNTER — Encounter (HOSPITAL_COMMUNITY): Payer: Self-pay | Admitting: Emergency Medicine

## 2016-10-04 ENCOUNTER — Emergency Department (HOSPITAL_COMMUNITY): Payer: Medicaid Other

## 2016-10-04 ENCOUNTER — Emergency Department (HOSPITAL_COMMUNITY)
Admission: EM | Admit: 2016-10-04 | Discharge: 2016-10-04 | Disposition: A | Discharge status: Home / Self Care | Payer: Medicaid Other | Attending: Emergency Medicine | Admitting: Emergency Medicine

## 2016-10-04 DIAGNOSIS — M791 Myalgia: Secondary | ICD-10-CM | POA: Diagnosis not present

## 2016-10-04 DIAGNOSIS — Z79899 Other long term (current) drug therapy: Secondary | ICD-10-CM | POA: Diagnosis not present

## 2016-10-04 DIAGNOSIS — G43109 Migraine with aura, not intractable, without status migrainosus: Secondary | ICD-10-CM | POA: Insufficient documentation

## 2016-10-04 DIAGNOSIS — F17210 Nicotine dependence, cigarettes, uncomplicated: Secondary | ICD-10-CM | POA: Diagnosis not present

## 2016-10-04 DIAGNOSIS — R51 Headache: Secondary | ICD-10-CM | POA: Diagnosis present

## 2016-10-04 MED ORDER — SODIUM CHLORIDE 0.9 % IV BOLUS (SEPSIS)
1000.0000 mL | Freq: Once | INTRAVENOUS | Status: AC
  Administered 2016-10-04: 1000 mL via INTRAVENOUS

## 2016-10-04 MED ORDER — DIPHENHYDRAMINE HCL 50 MG/ML IJ SOLN
25.0000 mg | Freq: Once | INTRAMUSCULAR | Status: AC
  Administered 2016-10-04: 25 mg via INTRAVENOUS
  Filled 2016-10-04: qty 1

## 2016-10-04 MED ORDER — METOCLOPRAMIDE HCL 5 MG/ML IJ SOLN
10.0000 mg | Freq: Once | INTRAMUSCULAR | Status: AC
  Administered 2016-10-04: 10 mg via INTRAVENOUS
  Filled 2016-10-04: qty 2

## 2016-10-04 MED ORDER — KETOROLAC TROMETHAMINE 30 MG/ML IJ SOLN
30.0000 mg | Freq: Once | INTRAMUSCULAR | Status: AC
  Administered 2016-10-04: 30 mg via INTRAVENOUS
  Filled 2016-10-04: qty 1

## 2016-10-04 NOTE — Discharge Instructions (Signed)
Please follow up with your primary care provider for further evaluation and management of your condition.  Return if you have any concerns.

## 2016-10-04 NOTE — ED Notes (Signed)
Patient transported to CT 

## 2016-10-04 NOTE — ED Triage Notes (Addendum)
Per EMS, patient from home, c/o left sided pain x1 week. States she was seen at [facility] x1 week ago. Hx chronic back pain. Ambulatory. Denies headache and blurred vision.  Patient reports pain begins in head and radiates down entire left side of body. States pain worsens with movement.  BP 140/92 HR 100 RR 16 CBG 102

## 2016-10-04 NOTE — ED Provider Notes (Signed)
[facility] DEPT Provider Note   CSN: [id number] Arrival date & time: 10/04/16  1146     History   Chief Complaint Chief Complaint  Patient presents with  . Headache    HPI Tina [name] is a 36 y.o. female.  HPI   36 year old female with hx of migraine, chronic thoracic pain, depression brought here via EMS from home with complaint of  L sided pain and generalized nonspecific weakness x 1 week.  sxs progressively worse, went to Urgent Care on Sunday but did not receive any definitive treatment.  Pain worsen yesterday, having pain with walking on the left side.  Increase headache, transient blurry vision, peripheral flashes in vision. Described headache as a pressure sensation starting in her forehead and spread towards neck. No diplopia, or tunnel vision.  Does report chest heaviness without cp or sob.  Have chronic cough.  Has nausea without vomiting.  No diarrhea, constipation, recent illnesses or recent trauma.  Has tried gentle stretching without relief. Does f/u with a pain specialist and she has been taking her pain medication without relief. Has hx of low back pain, has hx of thyroid nodule.  Pain currently moderate to severe, affecting her daily activity, affect only the left side from L side of face, to L neck and down to the toes.  Pt felt frustrating with her pain and report if she doesn't find an answer then she have contemplating on harming herself due to frustration.  She is unable to f/u with PCP due to him leaving to work at another hospital and the practice is currently in search of another provider.  Pt is R hand dominant.      Past Medical History:  Diagnosis Date  . Allergic rhinitis   . Anxiety   . Chronic thoracic back pain   . GERD (gastroesophageal reflux disease)   . History of gestational diabetes   . Hyperlipidemia   . Major depression   . Menorrhagia   . Migraine   . PONV (postoperative nausea and vomiting)   . Smokers' cough ([place])    albuterol  (PROVENTIL HFA;VENTOLIN HFA)    Patient Active Problem List   Diagnosis Date Noted  . Thyroid nodule 06/06/2016  . S/P cesarean section 08/29/2014  . Venereal disease, unspecified 06/23/2012  . Cervicalgia 04/21/2012  . 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 Postlaminectomy syndrome, lumbar region 06/26/2011  . Lumbago 05/09/2011  . 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 Seasonal and perennial allergic rhinitis 05/08/2011  . Sacroiliac dysfunction 04/07/2011  . Lumbosacral spondylosis without myelopathy 03/10/2011  . Dizziness and giddiness 11/02/2010  . Preventative health care 04/26/2010  . SMOKER 03/29/2010  . HYPERSOMNIA 02/08/2010  . HYPERLIPIDEMIA 04/26/2009  . ADD 04/26/2009  . Pruritus ani 04/26/2009  . ANXIETY 02/01/2009  . DEPRESSION 02/01/2009  . COMMON MIGRAINE 02/01/2009  . GERD 02/01/2009  . CHOLELITHIASIS 02/01/2009    Past Surgical History:  Procedure Laterality Date  . BIOPSY THYROID  06/06/2016   LEFT THYROIDECTOMY  . CESAREAN SECTION  05/21/2004  . CESAREAN SECTION WITH BILATERAL TUBAL LIGATION Bilateral 08/29/2014   Procedure: CESAREAN SECTION WITH BILATERAL TUBAL LIGATION;  Surgeon: Cheri Fowler, MD;  Location: [facility];  Service: Obstetrics;  Laterality: Bilateral;  MD requests RNFA [name] confirmed 7/29...tms  . CHOLECYSTECTOMY N/A 03/26/2015   Procedure: LAPAROSCOPIC CHOLECYSTECTOMY;  Surgeon: Arta Bruce Kinsinger, MD;  Location: [facility];  Service: General;  Laterality: N/A;  . DILITATION & CURRETTAGE/HYSTROSCOPY WITH NOVASURE ABLATION  N/A 01/28/2016   Procedure: DILATATION & CURETTAGE/HYSTEROSCOPY WITH NOVASURE ABLATION;  Surgeon: Cheri Fowler, MD;  Location: [facility];  Service: Gynecology;  Laterality: N/A;  . LUMBAR MICRODISCECTOMY  11/17/2007   L5-  S1  . THYROIDECTOMY Left 06/06/2016   Procedure: LEFT THYROIDECTOMY AND CORE BIOPSY OF RIGHT THYROID;  Surgeon: Melida Quitter, MD;  Location: [facility];  Service:  ENT;  Laterality: Left;  . TONSILLECTOMY  2001  . TUBAL LIGATION      OB History    Gravida Para Term Preterm AB Living   3 2 2   1 2    SAB TAB Ectopic Multiple Live Births   1     0 2       Home Medications    Prior to Admission medications   Medication Sig Start Date End Date Taking? Authorizing Provider  acetaminophen (TYLENOL) 650 MG CR tablet Take 1,300 mg by mouth every 8 (eight) hours as needed for pain.    [provider]  albuterol (PROVENTIL HFA;VENTOLIN HFA) 108 (90 Base) MCG/ACT inhaler Inhale 2 puffs into the lungs every 4 (four) hours as needed for wheezing or shortness of breath.  02/13/16 02/12/17  [provider]  clonazePAM (KLONOPIN) 0.5 MG tablet Take 0.5 mg by mouth 2 (two) times daily as needed for anxiety.    [provider]  cyclobenzaprine (FLEXERIL) 10 MG tablet Take 10 mg by mouth at bedtime as needed for muscle spasms.    [provider]  dexlansoprazole (DEXILANT) 60 MG capsule Take 60 mg by mouth at bedtime.     [provider]  DULoxetine (CYMBALTA) 30 MG capsule Take 30 mg by mouth at bedtime.    [provider]  FLUoxetine (PROZAC) 20 MG tablet Take 40 mg by mouth at bedtime.  02/26/16   [provider]  HYDROcodone-acetaminophen (NORCO/VICODIN) 5-325 MG tablet Take 1-2 tablets by mouth every 4 (four) hours as needed for moderate pain or severe pain. 06/07/16   Melida Quitter, MD  lidocaine (LIDODERM) 5 % Place 1 patch onto the skin daily. Remove & Discard patch within 12 hours or as directed by MD Patient not taking: Reported on 05/23/2016 03/01/16   Kinnie Feil, PA-C  methocarbamol (ROBAXIN) 500 MG tablet Take 1 tablet (500 mg total) by mouth 2 (two) times daily. Patient taking differently: Take 500 mg by mouth daily as needed for muscle spasms.  03/01/16   Kinnie Feil, PA-C  naproxen (NAPROSYN) 500 MG tablet Take 1 tablet (500 mg total) by mouth 2 (two) times daily. Patient not  taking: Reported on 05/23/2016 03/01/16   Kinnie Feil, PA-C    Family History Family History  Problem Relation Age of Onset  . Depression Mother   . Mental illness Mother   . Diabetes Father   . Hypertension Father   . Hyperlipidemia Father   . Diabetes Maternal Grandmother        Dialysis  . Hypertension Maternal Grandmother   . Heart disease Maternal Grandmother   . Cancer Paternal Grandmother        Breast Cancer  . Cancer Paternal Grandfather        colon  . Colon cancer Paternal Grandfather   . Diabetes Brother   . Cancer Other        colon  . Kidney failure Other        dialysis  . Colon cancer Paternal Uncle   . Esophageal cancer Neg Hx   .  Stomach cancer Neg Hx   . Rectal cancer Neg Hx     Social History Social History  Substance Use Topics  . Smoking status: Current Every Day Smoker    Packs/day: 0.50    Years: 20.00    Types: Cigarettes  . Smokeless tobacco: Never Used  . Alcohol use No     Allergies   Oxycodone; Tramadol; and Wellbutrin [bupropion]   Review of Systems Review of Systems  All other systems reviewed and are negative.    Physical Exam Updated Vital Signs BP 131/79 (BP Location: Left Arm)   Pulse 80   Temp 98 F (36.7 C) (Oral)   Resp 17   SpO2 100%   Physical Exam  Constitutional: She is oriented to person, place, and time. She appears well-developed and well-nourished. No distress.  Obese female, non toxic in appearance.   HENT:  Head: Atraumatic.  Eyes: Pupils are equal, round, and reactive to light. Conjunctivae and EOM are normal.  Neck: Neck supple.  Cardiovascular: Normal rate and regular rhythm.   Pulmonary/Chest: Effort normal and breath sounds normal.  Abdominal: Soft. Bowel sounds are normal. She exhibits no distension. There is no tenderness.  Musculoskeletal: Normal range of motion. She exhibits tenderness (tenderness to entire L side of body from face to feet with palpation. ).  5/5 strength to all 4  extremities with intact distal pulses  Neurological: She is alert and oriented to person, place, and time. She displays normal reflexes.  Skin: Skin is warm. No rash noted.  Psychiatric: She has a normal mood and affect.  Nursing note and vitals reviewed.    ED Treatments / Results  Labs (all labs ordered are listed, but only abnormal results are displayed) Labs Reviewed - No data to display  EKG  EKG Interpretation None       Radiology Dg Cervical Spine Complete  Result Date: 10/04/2016 CLINICAL DATA:  Acute on chronic neck pain for 1 week. EXAM: CERVICAL SPINE - COMPLETE 4+ VIEW COMPARISON:  None. FINDINGS: Cervical vertebral bodies and posterior elements appear intact and aligned to the inferior endplate of C7, the most caudal well visualized level. Straightened cervical lordosis. No osseous neural foraminal narrowing. Intervertebral disc heights preserved. No destructive bony lesions. Lateral masses in alignment. Prevertebral and paraspinal soft tissue planes are nonsuspicious. IMPRESSION: Negative cervical spine radiographs. Electronically Signed   By: Elon Alas M.D.   On: 10/04/2016 17:08   Ct Head Wo Contrast  Result Date: 10/04/2016 CLINICAL DATA:  Left-sided head and neck pain 1 week.  No injury. EXAM: CT HEAD WITHOUT CONTRAST TECHNIQUE: Contiguous axial images were obtained from the base of the skull through the vertex without intravenous contrast. COMPARISON:  None. FINDINGS: Brain: No evidence of acute infarction, hemorrhage, hydrocephalus, extra-axial collection or mass lesion/mass effect. Vascular: No hyperdense vessel or unexpected calcification. Skull: Normal. Negative for fracture or focal lesion. Sinuses/Orbits: Orbits are within normal. There is moderate chronic inflammatory change over the right maxillary sinus. Other: None. IMPRESSION: No acute intracranial findings. Chronic sinus inflammatory change involving the right maxillary sinus. Electronically Signed    By: Marin Olp M.D.   On: 10/04/2016 16:34    Procedures Procedures (including critical care time)  Medications Ordered in ED Medications  ketorolac (TORADOL) 30 MG/ML injection 30 mg (30 mg Intravenous Given 10/04/16 1703)  diphenhydrAMINE (BENADRYL) injection 25 mg (25 mg Intravenous Given 10/04/16 1702)  metoCLOPramide (REGLAN) injection 10 mg (10 mg Intravenous Given 10/04/16 1702)  sodium chloride 0.9 %  bolus 1,000 mL (1,000 mLs Intravenous New Bag/Given 10/04/16 1703)     Initial Impression / Assessment and Plan / ED Course  I have reviewed the triage vital signs and the nursing notes.  Pertinent labs & imaging results that were available during my care of the patient were reviewed by me and considered in my medical decision making (see chart for details).     BP 119/77 (BP Location: Left Arm)   Pulse 72   Temp 98.4 F (36.9 C) (Oral)   Resp 14   SpO2 100%    Final Clinical Impressions(s) / ED Diagnoses   Final diagnoses:  Complicated migraine    New Prescriptions New Prescriptions   No medications on file   6:13 PM Patient complaining of pain throughout the left side of her body including her face, neck, and the rest of her left extremities. Symptoms primarily pain at somewhat weakness. She does have history of chronic pain and follow-up with a pain specialist. She has no focal neuro deficit on exam. His CT scan as well as a cervical spine x-ray was obtained showing no acute finding. Patient receiving a migraine cocktail and felt much better. I encouraged patient to follow-up with her primary care doctor and her pain specialist for further management. Return precaution discussed. Care discussed with Dr. Jeneen Rinks.   Domenic Moras, PA-C 10/04/16 1816    Tanna Furry, MD 10/14/16 [phone]

## 2016-11-07 DIAGNOSIS — G43909 Migraine, unspecified, not intractable, without status migrainosus: Secondary | ICD-10-CM | POA: Diagnosis present

## 2016-11-18 DIAGNOSIS — D500 Iron deficiency anemia secondary to blood loss (chronic): Secondary | ICD-10-CM

## 2016-11-18 HISTORY — DX: Iron deficiency anemia secondary to blood loss (chronic): D50.0

## 2017-03-16 DIAGNOSIS — F339 Major depressive disorder, recurrent, unspecified: Secondary | ICD-10-CM

## 2017-03-16 DIAGNOSIS — F324 Major depressive disorder, single episode, in partial remission: Secondary | ICD-10-CM | POA: Insufficient documentation

## 2017-03-16 DIAGNOSIS — F3341 Major depressive disorder, recurrent, in partial remission: Secondary | ICD-10-CM | POA: Insufficient documentation

## 2017-03-16 HISTORY — DX: Major depressive disorder, recurrent, unspecified: F33.9

## 2018-04-09 IMAGING — MR MR LUMBAR SPINE W/O CM
4 of 5 series · 24 of 48 positions shown · non-contrast
Comparison: Lumbar MRI 07/12/2012 and earlier.

CLINICAL DATA: 35-year-old female with chronic pain radiating to
the hips and both lower extremities. Twisting/lifting injury in 9779
status post surgery in 7114. Fall in November 2015. Initial
encounter.

EXAM:
MRI LUMBAR SPINE WITHOUT CONTRAST
TECHNIQUE: Multiplanar, multisequence MR imaging of the lumbar spine was
performed. No intravenous contrast was administered.

[Series 3: T1 · sagittal · 4.0mm · 0.84mm/px · 6 of 14 slices shown (1 of 2)]
[im 1/14]
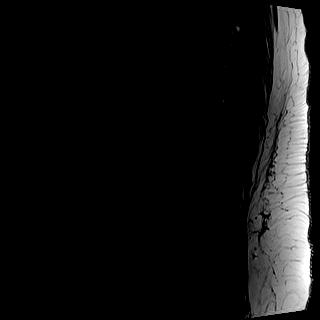
[im 3/14]
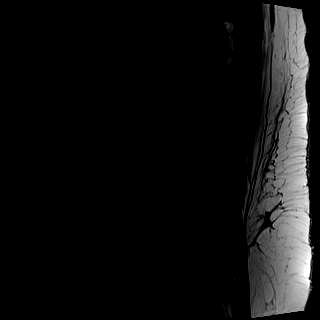
[im 5/14]
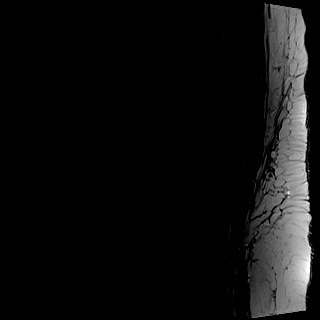
[im 7/14]
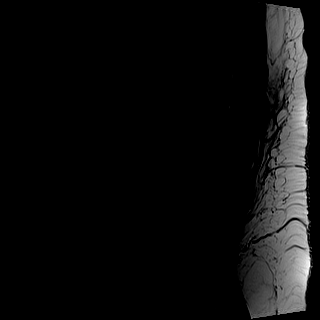
[im 9/14]
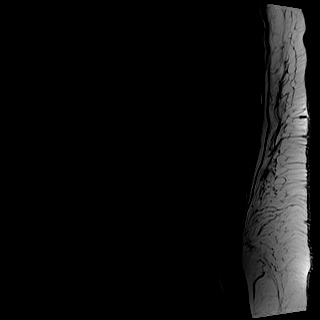
[im 11/14]
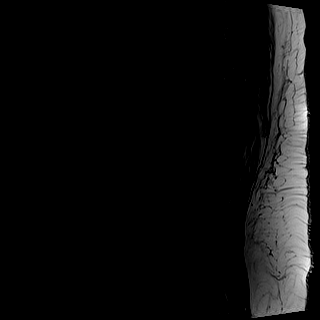

[Series 4: T2 · sagittal · 4.0mm · 0.42mm/px · 7 of 14 slices shown (1 of 2)]
[im 1/14]
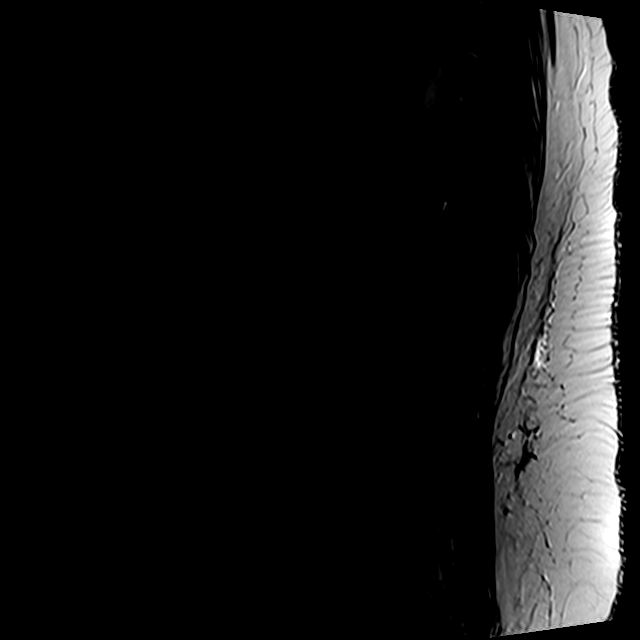
[im 3/14]
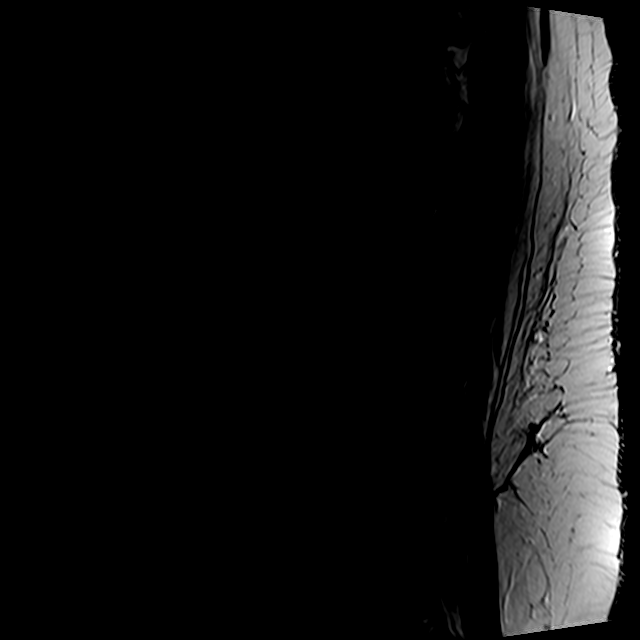
[im 5/14]
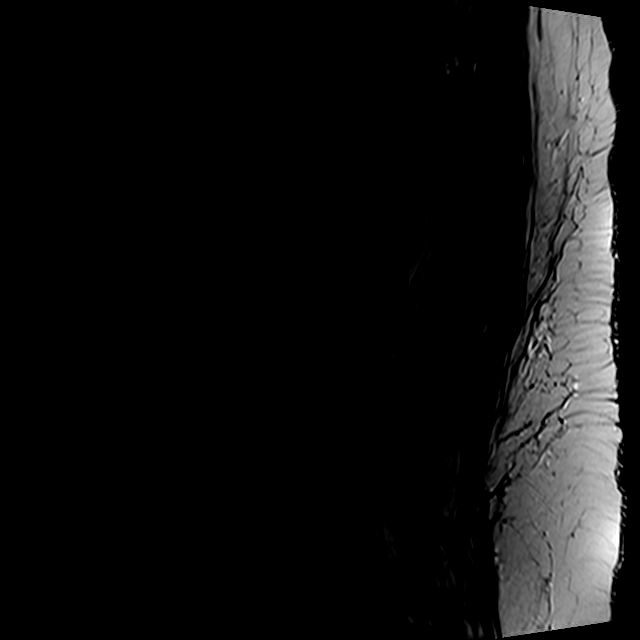
[im 7/14]
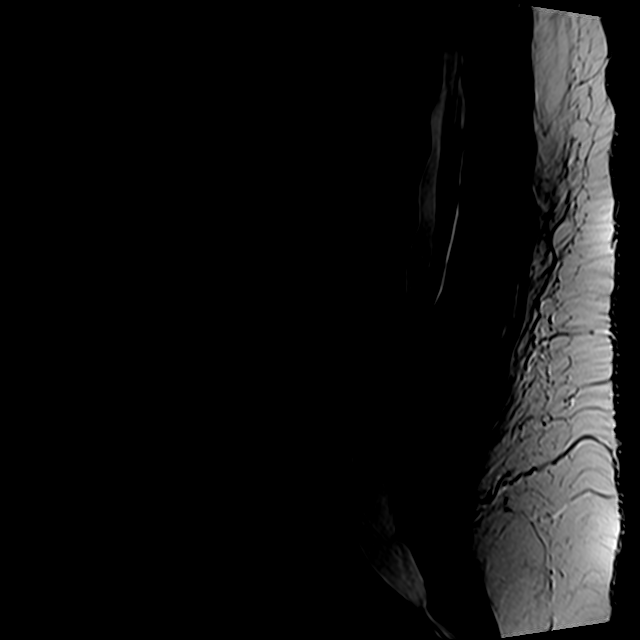
[im 9/14]
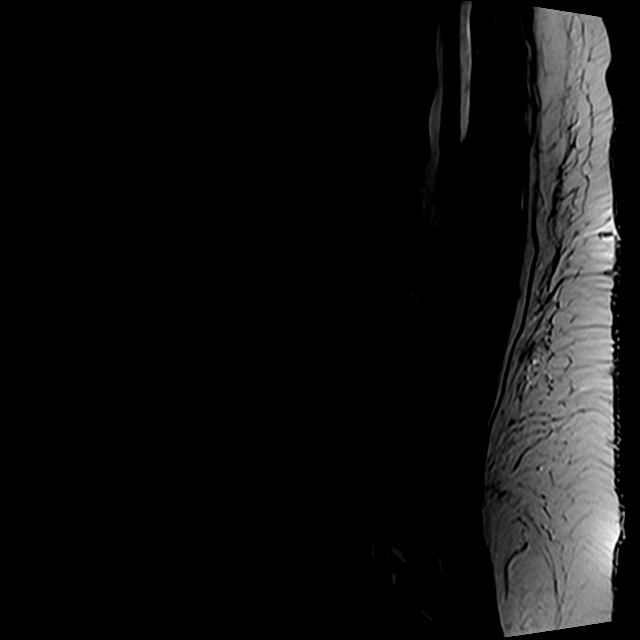
[im 11/14]
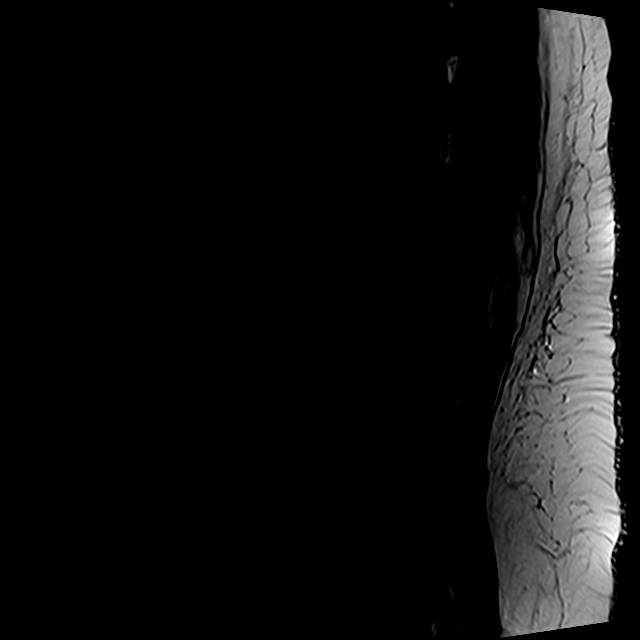
[im 14/14]
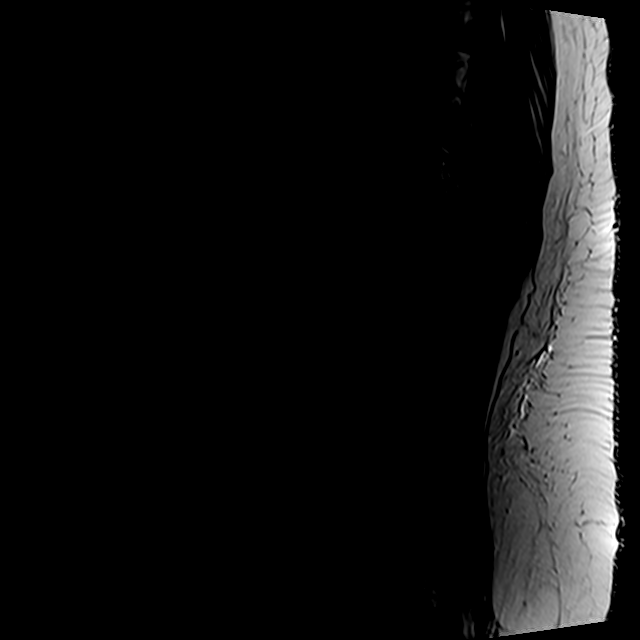

[Series 5: T2 · axial · 4.0mm · 0.70mm/px · z∈[-70,+93]mm · 8 of 32 slices shown (2 of 2)]
[im 1/32]
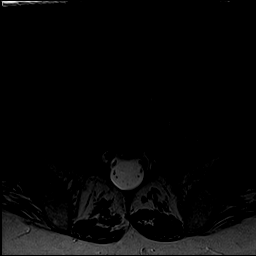
[im 5/32]
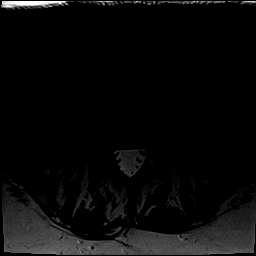
[im 10/32]
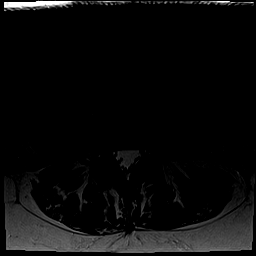
[im 15/32]
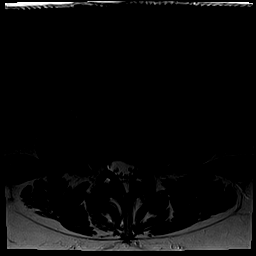
[im 17/32]
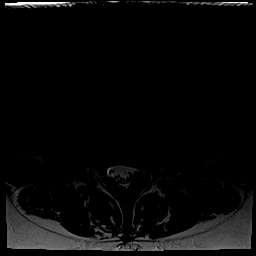
[im 22/32]
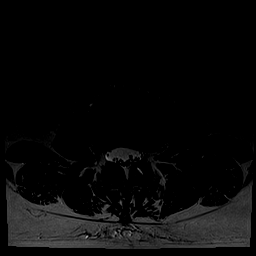
[im 27/32]
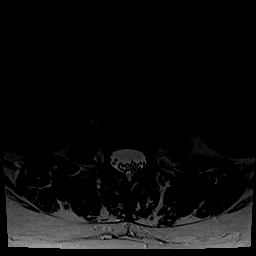
[im 32/32]
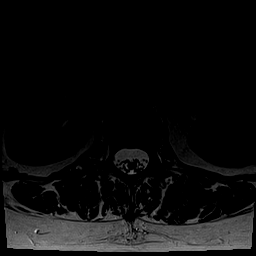

[Series 7: T1 · axial · 4.0mm · 0.35mm/px · z∈[-51,+67]mm · 3 of 32 slices shown (2 of 2)]
[im 5/32]
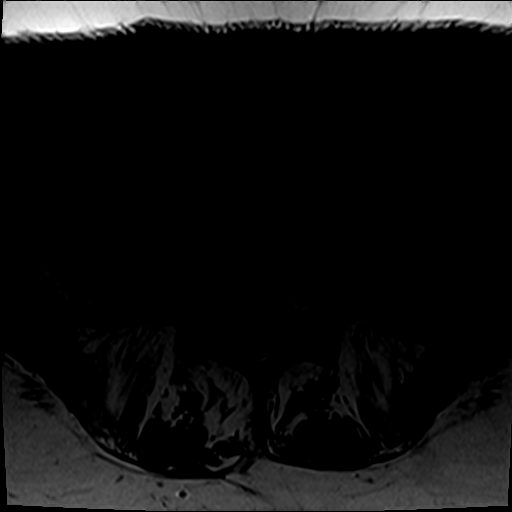
[im 17/32]
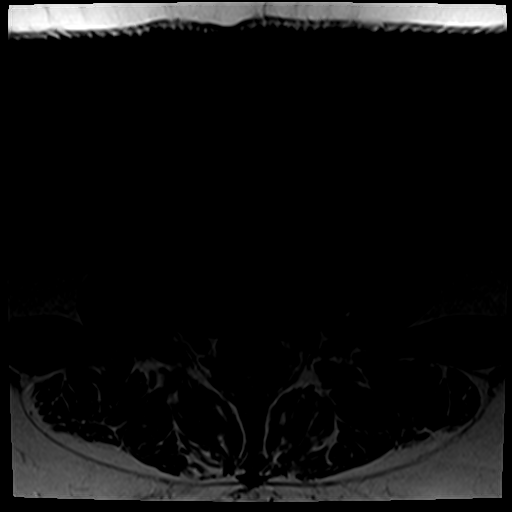
[im 27/32]
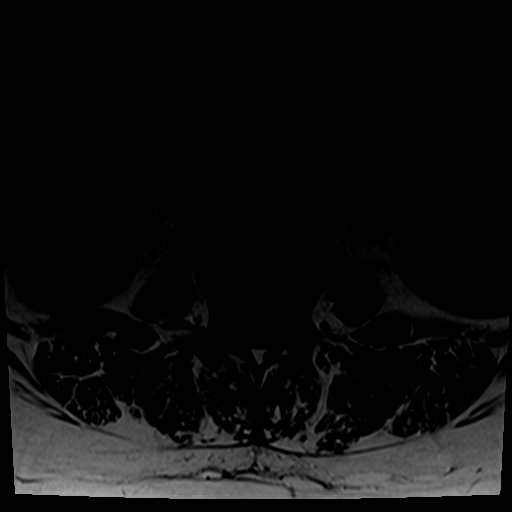

[24 of 48 positions shown; findings below may reference images not displayed]

FINDINGS: Segmentation: Same numbering system as on the 4947 MRI designating
normal lumbar segmentation.

Alignment: Chronic straightening and mild reversal of lumbar
lordosis, stable since 4947. Mild retrolisthesis of L3 on L4.

Vertebrae: No marrow edema or evidence of acute osseous abnormality.
Negative visualized sacrum.

Conus medullaris: Extends to the L1 level and appears normal.

Paraspinal and other soft tissues: Negative.

Disc levels:

T10-T11:  Partially visible, negative.

T11-T12:  Negative.

T12-L1:  Mild facet hypertrophy.  No stenosis.

L1-L2: Mild chronic anterior inferior L1 endplate degeneration. No
stenosis.

L2-L3: Minimal circumferential disc bulge. Mild left facet
hypertrophy with trace facet joint fluid. No stenosis.

L3-L4: Chronic disc desiccation and left eccentric circumferential
disc bulge with broad-based left subarticular and foraminal
component of disc (series 5, image 17). Mild facet hypertrophy.
Moderate left lateral recess stenosis (descending left L4 nerve root
level). Mild left L3 neural foraminal stenosis.

This level is stable.

L4-L5: Moderate facet hypertrophy greater on the right. Mild mostly
far lateral disc bulging and endplate spurring. Mild right L4
foraminal stenosis. This level is stable.

L5-S1: Mild mostly far lateral disc bulging and endplate spurring.
Mild facet hypertrophy greater on the right. Mild new disc bulging
with a small annular fissure does efface the right lateral recess
(descending right S1 nerve root level series 4, image 5 and series
5, image 29). The left lateral recess remains normal. Thecal sac is
widely patent. There is mild to moderate chronic right L5 foraminal
stenosis which is stable.
IMPRESSION: 1. Mild progression of chronic L5-S1 disc degeneration since 4947
with new mild involvement of the right lateral recess. Chronic mild
to moderate right L5 neural foraminal stenosis in part related to
endplate and facet spurring is stable.
2. Other lumbar levels are stable since 4947 including chronic L3-L4
disc degeneration with moderate left lateral recess and mild left L3
neural foraminal stenosis.

## 2020-09-03 ENCOUNTER — Other Ambulatory Visit: Payer: Self-pay

## 2020-09-03 ENCOUNTER — Encounter (HOSPITAL_BASED_OUTPATIENT_CLINIC_OR_DEPARTMENT_OTHER): Payer: Self-pay | Admitting: Obstetrics and Gynecology

## 2020-09-03 NOTE — Progress Notes (Signed)
Spoke w/ via phone for pre-op interview---pt Lab needs dos----      n/a         Lab results------n/a COVID test -----patient states asymptomatic no test needed Arrive at -------053 on 09/05/2020 NPO after MN NO Solid Food.  Clear liquids from MN until--0430 on 09/05/20- Med rec completed Medications to take morning of surgery -----none Diabetic medication -----n/a Patient instructed no nail polish to be worn day of surgery Patient instructed to bring photo id and insurance card day of surgery Patient aware to have Driver (ride ) / caregiver father Tina [name]   for 24 hours after surgery  Patient Special Instructions -----stop Ibuprophen take tylenol for pain PRN Pre-Op special Istructions -----n/a Patient verbalized understanding of instructions that were given at this phone interview. Patient denies shortness of breath, chest pain, fever, cough at this phone interview.

## 2020-09-04 NOTE — H&P (Signed)
Tina [name] is an 40 y.o. female. She was seen for annual exam in July.  Previous Novasure ablation with improvement in menorrhagia.  However, over the past several months, still having menses monthly, but heavier and lasting up to 2 weeks.  On ultrasound, she has a probable fundal endometrial polyp that has vascularity  Pertinent Gynecological History: Last mammogram: normal Last pap: normal OB History: G3, P2012 LTCS x 2   Menstrual History: No LMP recorded.    Past Medical History:  Diagnosis Date   Allergic rhinitis    Anxiety    Arthritis    In her back and legs per pt, 09/03/2020   Bipolar disorder ([place])    Diagnosised 10 yrs ago doesn't take any meds 09/03/2020   Chronic thoracic back pain    GERD (gastroesophageal reflux disease)    History of gestational diabetes    Hyperlipidemia    Major depression    Menorrhagia    Migraine    Smokers' cough ([facility])    albuterol (PROVENTIL HFA;VENTOLIN HFA)   Wears glasses    09/03/2020    Past Surgical History:  Procedure Laterality Date   BIOPSY THYROID  06/06/2016   LEFT THYROIDECTOMY   CESAREAN SECTION  05/21/2004   CESAREAN SECTION WITH BILATERAL TUBAL LIGATION Bilateral 08/29/2014   Procedure: CESAREAN SECTION WITH BILATERAL TUBAL LIGATION;  Surgeon: Cheri Fowler, MD;  Location: [facility];  Service: Obstetrics;  Laterality: Bilateral;  MD requests RNFA Tina [name]. RNFA confirmed 7/29...tms   CHOLECYSTECTOMY N/A 03/26/2015   Procedure: LAPAROSCOPIC CHOLECYSTECTOMY;  Surgeon: Arta Bruce Kinsinger, MD;  Location: [facility];  Service: General;  Laterality: N/A;   [place] N/A 01/28/2016   Procedure: DILATATION & CURETTAGE/HYSTEROSCOPY WITH NOVASURE ABLATION;  Surgeon: Cheri Fowler, MD;  Location: [facility];  Service: Gynecology;  Laterality: N/A;   GASTRIC BYPASS     done in 2021, 09/03/2020   LUMBAR MICRODISCECTOMY  11/17/2007   L5-  S1   THYROIDECTOMY Left  06/06/2016   Procedure: LEFT THYROIDECTOMY AND CORE BIOPSY OF RIGHT THYROID;  Surgeon: Melida Quitter, MD;  Location: [facility];  Service: ENT;  Laterality: Left;   TONSILLECTOMY  2001   TUBAL LIGATION      Family History  Problem Relation Age of Onset   Depression Mother    Mental illness Mother    Diabetes Father    Hypertension Father    Hyperlipidemia Father    Diabetes Maternal Grandmother        Dialysis   Hypertension Maternal Grandmother    Heart disease Maternal Grandmother    Cancer Paternal Grandmother        Breast Cancer   Cancer Paternal Grandfather        colon   Colon cancer Paternal Grandfather    Diabetes Brother    Cancer Other        colon   Kidney failure Other        dialysis   Colon cancer Paternal Uncle    Esophageal cancer Neg Hx    Stomach cancer Neg Hx    Rectal cancer Neg Hx     Social History:  reports that she quit smoking about 4 years ago. Her smoking use included cigarettes. She has a 10.00 pack-year smoking history. She has never used smokeless tobacco. She reports that she does not drink alcohol and does not use drugs.  Allergies:  Allergies  Allergen Reactions   Oxycodone Itching    SEVERE (percocet)  Tramadol Other (See Comments)    "Trigger's migraine's"   Wellbutrin [Bupropion] Hives    No medications prior to admission.    Review of Systems  Respiratory: Negative.    Cardiovascular: Negative.    Height '5\' 7"'$  (1.702 m), weight 71.2 kg. Physical Exam Constitutional:      Appearance: Normal appearance.  Cardiovascular:     Rate and Rhythm: Normal rate and regular rhythm.     Heart sounds: Normal heart sounds. No murmur heard. Pulmonary:     Effort: Pulmonary effort is normal. No respiratory distress.     Breath sounds: Normal breath sounds.  Abdominal:     General: There is no distension.     Palpations: Abdomen is soft. There is no mass.     Tenderness: There is no abdominal tenderness.  Genitourinary:    Comments:  Normal size uterus No adnexal mass Musculoskeletal:     Cervical back: Normal range of motion and neck supple.  Neurological:     Mental Status: She is alert.    No results found for this or any previous visit (from the past 24 hour(s)).  No results found.  Assessment/Plan: Menorrhagia, s/p Novasure ablation 4 years ago, probable endometrial polyp by u/s.  Surgical procedure, risks, chances of relieving symptoms have all been discussed, questions answered.  Will admit for hysteroscopy, possible D&C, possible myosure resection of polyp or other lesion  Tina [name] Tina [name] 09/04/2020, 8:54 PM

## 2020-09-05 ENCOUNTER — Ambulatory Visit (HOSPITAL_BASED_OUTPATIENT_CLINIC_OR_DEPARTMENT_OTHER)
Admission: RE | Admit: 2020-09-05 | Discharge: 2020-09-05 | Disposition: A | Discharge status: Home / Self Care | Payer: Medicaid Other | Attending: Obstetrics and Gynecology | Admitting: Obstetrics and Gynecology

## 2020-09-05 ENCOUNTER — Ambulatory Visit (HOSPITAL_BASED_OUTPATIENT_CLINIC_OR_DEPARTMENT_OTHER): Payer: Medicaid Other | Admitting: Anesthesiology

## 2020-09-05 ENCOUNTER — Other Ambulatory Visit: Payer: Self-pay

## 2020-09-05 ENCOUNTER — Encounter (HOSPITAL_BASED_OUTPATIENT_CLINIC_OR_DEPARTMENT_OTHER): Payer: Self-pay | Admitting: Obstetrics and Gynecology

## 2020-09-05 ENCOUNTER — Encounter (HOSPITAL_BASED_OUTPATIENT_CLINIC_OR_DEPARTMENT_OTHER)
Admission: RE | Disposition: A | Discharge status: Home / Self Care | Payer: Self-pay | Source: Home / Self Care | Attending: Obstetrics and Gynecology

## 2020-09-05 DIAGNOSIS — N736 Female pelvic peritoneal adhesions (postinfective): Secondary | ICD-10-CM | POA: Diagnosis not present

## 2020-09-05 DIAGNOSIS — Z9049 Acquired absence of other specified parts of digestive tract: Secondary | ICD-10-CM | POA: Diagnosis not present

## 2020-09-05 DIAGNOSIS — Z9884 Bariatric surgery status: Secondary | ICD-10-CM | POA: Diagnosis not present

## 2020-09-05 DIAGNOSIS — Z87891 Personal history of nicotine dependence: Secondary | ICD-10-CM | POA: Insufficient documentation

## 2020-09-05 DIAGNOSIS — N920 Excessive and frequent menstruation with regular cycle: Secondary | ICD-10-CM | POA: Insufficient documentation

## 2020-09-05 DIAGNOSIS — Z885 Allergy status to narcotic agent status: Secondary | ICD-10-CM | POA: Diagnosis not present

## 2020-09-05 DIAGNOSIS — Z888 Allergy status to other drugs, medicaments and biological substances status: Secondary | ICD-10-CM | POA: Diagnosis not present

## 2020-09-05 DIAGNOSIS — N858 Other specified noninflammatory disorders of uterus: Secondary | ICD-10-CM | POA: Diagnosis not present

## 2020-09-05 HISTORY — PX: DILATATION & CURETTAGE/HYSTEROSCOPY WITH MYOSURE: SHX6511

## 2020-09-05 HISTORY — DX: Unspecified osteoarthritis, unspecified site: M19.90

## 2020-09-05 HISTORY — DX: Presence of spectacles and contact lenses: Z97.3

## 2020-09-05 HISTORY — DX: Bipolar disorder, unspecified: F31.9

## 2020-09-05 LAB — CBC
HCT: 41.9 % (ref 36.0–46.0)
Hemoglobin: 14.1 g/dL (ref 12.0–15.0)
MCH: 29.3 pg (ref 26.0–34.0)
MCHC: 33.7 g/dL (ref 30.0–36.0)
MCV: 86.9 fL (ref 80.0–100.0)
Platelets: 201 10*3/uL (ref 150–400)
RBC: 4.82 MIL/uL (ref 3.87–5.11)
RDW: 13.9 % (ref 11.5–15.5)
WBC: 6.1 10*3/uL (ref 4.0–10.5)
nRBC: 0 % (ref 0.0–0.2)

## 2020-09-05 LAB — POCT PREGNANCY, URINE: Preg Test, Ur: NEGATIVE

## 2020-09-05 SURGERY — DILATATION & CURETTAGE/HYSTEROSCOPY WITH MYOSURE
Anesthesia: General | Site: Vagina

## 2020-09-05 MED ORDER — KETOROLAC TROMETHAMINE 30 MG/ML IJ SOLN
INTRAMUSCULAR | Status: AC
  Filled 2020-09-05: qty 1

## 2020-09-05 MED ORDER — MIDAZOLAM HCL 2 MG/2ML IJ SOLN
INTRAMUSCULAR | Status: AC
  Filled 2020-09-05: qty 2

## 2020-09-05 MED ORDER — ONDANSETRON HCL 4 MG/2ML IJ SOLN
INTRAMUSCULAR | Status: DC | PRN
  Administered 2020-09-05: 4 mg via INTRAVENOUS

## 2020-09-05 MED ORDER — PROPOFOL 10 MG/ML IV BOLUS
INTRAVENOUS | Status: AC
  Filled 2020-09-05: qty 40

## 2020-09-05 MED ORDER — FENTANYL CITRATE (PF) 100 MCG/2ML IJ SOLN
INTRAMUSCULAR | Status: AC
  Filled 2020-09-05: qty 2

## 2020-09-05 MED ORDER — DEXAMETHASONE SODIUM PHOSPHATE 4 MG/ML IJ SOLN
INTRAMUSCULAR | Status: DC | PRN
  Administered 2020-09-05: 10 mg via INTRAVENOUS

## 2020-09-05 MED ORDER — LACTATED RINGERS IV SOLN: INTRAVENOUS | Status: DC

## 2020-09-05 MED ORDER — SODIUM CHLORIDE 0.9 % IR SOLN
Status: DC | PRN
  Administered 2020-09-05 (×2): 3000 mL

## 2020-09-05 MED ORDER — PROPOFOL 10 MG/ML IV BOLUS
INTRAVENOUS | Status: DC | PRN
  Administered 2020-09-05: 200 mg via INTRAVENOUS
  Administered 2020-09-05: 100 mg via INTRAVENOUS

## 2020-09-05 MED ORDER — ONDANSETRON HCL 4 MG/2ML IJ SOLN
INTRAMUSCULAR | Status: AC
  Filled 2020-09-05: qty 2

## 2020-09-05 MED ORDER — SILVER NITRATE-POT NITRATE 75-25 % EX MISC
CUTANEOUS | Status: DC | PRN
  Administered 2020-09-05: 1

## 2020-09-05 MED ORDER — LIDOCAINE HCL 2 % IJ SOLN
INTRAMUSCULAR | Status: DC | PRN
  Administered 2020-09-05: 16 mL

## 2020-09-05 MED ORDER — DEXAMETHASONE SODIUM PHOSPHATE 10 MG/ML IJ SOLN
INTRAMUSCULAR | Status: AC
  Filled 2020-09-05: qty 1

## 2020-09-05 MED ORDER — KETOROLAC TROMETHAMINE 30 MG/ML IJ SOLN
INTRAMUSCULAR | Status: DC | PRN
  Administered 2020-09-05: 30 mg via INTRAVENOUS

## 2020-09-05 MED ORDER — FENTANYL CITRATE (PF) 100 MCG/2ML IJ SOLN: 25.0000 ug | INTRAMUSCULAR | Status: DC | PRN

## 2020-09-05 MED ORDER — LIDOCAINE HCL (PF) 2 % IJ SOLN
INTRAMUSCULAR | Status: AC
  Filled 2020-09-05: qty 5

## 2020-09-05 MED ORDER — LIDOCAINE HCL (CARDIAC) PF 100 MG/5ML IV SOSY
PREFILLED_SYRINGE | INTRAVENOUS | Status: DC | PRN
  Administered 2020-09-05: 100 mg via INTRAVENOUS

## 2020-09-05 MED ORDER — FENTANYL CITRATE (PF) 100 MCG/2ML IJ SOLN
INTRAMUSCULAR | Status: DC | PRN
  Administered 2020-09-05: 25 ug via INTRAVENOUS
  Administered 2020-09-05: 100 ug via INTRAVENOUS
  Administered 2020-09-05: 75 ug via INTRAVENOUS

## 2020-09-05 MED ORDER — MIDAZOLAM HCL 5 MG/5ML IJ SOLN
INTRAMUSCULAR | Status: DC | PRN
  Administered 2020-09-05: 2 mg via INTRAVENOUS

## 2020-09-05 SURGICAL SUPPLY — 16 items
DEVICE MYOSURE REACH (MISCELLANEOUS) ×2 IMPLANT
GAUZE 4X4 16PLY ~~LOC~~+RFID DBL (SPONGE) ×4 IMPLANT
GLOVE SURG ORTHO LTX SZ7.5 (GLOVE) ×3 IMPLANT
GLOVE SURG UNDER POLY LF SZ7 (GLOVE) ×6 IMPLANT
GOWN STRL REUS W/TWL LRG LVL3 (GOWN DISPOSABLE) ×5 IMPLANT
IV NS IRRIG 3000ML ARTHROMATIC (IV SOLUTION) ×6 IMPLANT
KIT PROCEDURE FLUENT (KITS) ×3 IMPLANT
KIT TURNOVER CYSTO (KITS) ×3 IMPLANT
MYOSURE XL FIBROID (MISCELLANEOUS)
PACK VAGINAL MINOR WOMEN LF (CUSTOM PROCEDURE TRAY) ×3 IMPLANT
PAD OB MATERNITY 4.3X12.25 (PERSONAL CARE ITEMS) ×3 IMPLANT
PAD PREP 24X48 CUFFED NSTRL (MISCELLANEOUS) ×3 IMPLANT
SEAL ROD LENS SCOPE MYOSURE (ABLATOR) ×3 IMPLANT
SOL PREP POV-IOD 4OZ 10% (MISCELLANEOUS) ×2 IMPLANT
SYSTEM TISS REMOVAL MYOSURE XL (MISCELLANEOUS) IMPLANT
TOWEL OR 17X26 10 PK STRL BLUE (TOWEL DISPOSABLE) ×3 IMPLANT

## 2020-09-05 NOTE — Anesthesia Postprocedure Evaluation (Signed)
Anesthesia Post Note  Patient: Loss adjuster, chartered  Procedure(s) Performed: DILATATION & CURETTAGE/HYSTEROSCOPY WITH MYOSURE (Vagina )     Patient location during evaluation: PACU Anesthesia Type: General Level of consciousness: awake Pain management: pain level controlled Respiratory status: spontaneous breathing Cardiovascular status: stable Postop Assessment: no apparent nausea or vomiting Anesthetic complications: no   No notable events documented.  Last Vitals:  Vitals:   09/05/20 0544  BP: (!) 106/54  Pulse: (!) 53  Resp: 15  Temp: 36.6 C  SpO2: 100%    Last Pain:  Vitals:   09/05/20 0544  TempSrc: Oral  PainSc: 0-No pain                 Rehana Uncapher

## 2020-09-05 NOTE — Discharge Instructions (Addendum)
Routine instructions for hysteroscopy Post Anesthesia Home Care Instructions  Activity: Get plenty of rest for the remainder of the day. A responsible adult should stay with you for 24 hours following the procedure.  For the next 24 hours, DO NOT: -Drive a car -Paediatric nurse -Drink alcoholic beverages -Take any medication unless instructed by your physician -Make any legal decisions or sign important papers.  Meals: Start with liquid foods such as gelatin or soup. Progress to regular foods as tolerated. Avoid greasy, spicy, heavy foods. If nausea and/or vomiting occur, drink only clear liquids until the nausea and/or vomiting subsides. Call your physician if vomiting continues.  Special Instructions/Symptoms: Your throat may feel dry or sore from the anesthesia or the breathing tube placed in your throat during surgery. If this causes discomfort, gargle with warm salt water. The discomfort should disappear within 24 hours.  If you had a scopolamine patch placed behind your ear for the management of post- operative nausea and/or vomiting:  1. The medication in the patch is effective for 72 hours, after which it should be removed.  Wrap patch in a tissue and discard in the trash. Wash hands thoroughly with soap and water. 2. You may remove the patch earlier than 72 hours if you experience unpleasant side effects which may include dry mouth, dizziness or visual disturbances. 3. Avoid touching the patch. Wash your hands with soap and water after contact with the patch.    Post Anesthesia Home Care Instructions  Activity: Get plenty of rest for the remainder of the day. A responsible adult should stay with you for 24 hours following the procedure.  For the next 24 hours, DO NOT: -Drive a car -Paediatric nurse -Drink alcoholic beverages -Take any medication unless instructed by your physician -Make any legal decisions or sign important papers.  Meals: Start with liquid foods such  as gelatin or soup. Progress to regular foods as tolerated. Avoid greasy, spicy, heavy foods. If nausea and/or vomiting occur, drink only clear liquids until the nausea and/or vomiting subsides. Call your physician if vomiting continues.  Special Instructions/Symptoms: Your throat may feel dry or sore from the anesthesia or the breathing tube placed in your throat during surgery. If this causes discomfort, gargle with warm salt water. The discomfort should disappear within 24 hours.  If you had a scopolamine patch placed behind your ear for the management of post- operative nausea and/or vomiting:  1. The medication in the patch is effective for 72 hours, after which it should be removed.  Wrap patch in a tissue and discard in the trash. Wash hands thoroughly with soap and water. 2. You may remove the patch earlier than 72 hours if you experience unpleasant side effects which may include dry mouth, dizziness or visual disturbances. 3. Avoid touching the patch. Wash your hands with soap and water after contact with the patch.   DISCHARGE INSTRUCTIONS: HYSTEROSCOPY / ENDOMETRIAL ABLATION The following instructions have been prepared to help you care for yourself upon your return home.  May Remove Scop patch on or before  May take Ibuprofen after 2pm today   May take stool softner while taking narcotic pain medication to prevent constipation.  Drink plenty of water.  Personal hygiene:  Use sanitary pads for vaginal drainage, not tampons.  Shower the day after your procedure.  NO tub baths, pools or Jacuzzis for 2-3 weeks.  Wipe front to back after using the bathroom.  Activity and limitations:  Do NOT drive or operate any equipment  for 24 hours. The effects of anesthesia are still present and drowsiness may result.  Do NOT rest in bed all day.  Walking is encouraged.  Walk up and down stairs slowly.  You may resume your normal activity in one to two days or as indicated by your  physician. Sexual activity: NO intercourse for at least 2 weeks after the procedure, or as indicated by your Doctor.  Diet: Eat a light meal as desired this evening. You may resume your usual diet tomorrow.  Return to Work: You may resume your work activities in one to two days or as indicated by Marine scientist.  What to expect after your surgery: Expect to have vaginal bleeding/discharge for 2-3 days and spotting for up to 10 days. It is not unusual to have soreness for up to 1-2 weeks. You may have a slight burning sensation when you urinate for the first day. Mild cramps may continue for a couple of days. You may have a regular period in 2-6 weeks.  Call your doctor for any of the following:  Excessive vaginal bleeding or clotting, saturating and changing one pad every hour.  Inability to urinate 6 hours after discharge from hospital.  Pain not relieved by pain medication.  Fever of 100.4 F or greater.  Unusual vaginal discharge or odor.  Return to office _________________Call for an appointment ___________________ Patient's signature: ______________________ Nurse's signature ________________________  [facility] [phone]

## 2020-09-05 NOTE — Anesthesia Preprocedure Evaluation (Addendum)
Anesthesia Evaluation  Patient identified by MRN, date of birth, ID band Patient awake    Reviewed: Allergy & Precautions, NPO status   Airway Mallampati: II  TM Distance: >3 FB     Dental   Pulmonary Patient abstained from smoking., former smoker,    breath sounds clear to auscultation       Cardiovascular negative cardio ROS   Rhythm:Regular Rate:Normal     Neuro/Psych  Headaches, Anxiety Depression    GI/Hepatic Neg liver ROS, GERD  ,  Endo/Other  negative endocrine ROS  Renal/GU negative Renal ROS     Musculoskeletal  (+) Arthritis ,   Abdominal   Peds  Hematology   Anesthesia Other Findings   Reproductive/Obstetrics                             Anesthesia Physical Anesthesia Plan  ASA: 2  Anesthesia Plan: General   Post-op Pain Management:    Induction: Intravenous  PONV Risk Score and Plan: 3 and Ondansetron, Dexamethasone and Midazolam  Airway Management Planned: LMA  Additional Equipment:   Intra-op Plan:   Post-operative Plan: Extubation in OR  Informed Consent: I have reviewed the patients History and Physical, chart, labs and discussed the procedure including the risks, benefits and alternatives for the proposed anesthesia with the patient or authorized representative who has indicated his/her understanding and acceptance.     Dental advisory given  Plan Discussed with: CRNA and Anesthesiologist  Anesthesia Plan Comments:         Anesthesia Quick Evaluation

## 2020-09-05 NOTE — Op Note (Signed)
Preoperative diagnosis: AUB, possible endometrial polyp Postoperative diagnosis: Same, intrauterine adhesion Procedure: Hysteroscopy with Myosure resection of lesion and adhesiolysis Surgeon: Cheri Fowler M.D. Anesthesia: Gen. With an LMA, paracervical block Findings: She had a normal endometrial cavity with what appeared to be a superior midline adhesion from her previous Novasure.  There was a fluffy lesion to her right of this adhesion.  Tubal ostia visualized Estimated blood loss: 50cc Fluid deficit: Through the hysteroscope fluid deficit was about 500 cc Specimens: Endometrial resection sent for routine pathology Complications: None  Procedure in detail: The patient was taken to the operating room and placed in the dorsosupine position. General anesthesia was induced. She was placed in mobile stirrups and legs were elevated. Perineum and vagina were prepped and draped in the usual sterile fashion. A Graves speculum was inserted in the vagina and the anterior lip of the cervix was grasped with a single-tooth tenaculum. Paracervical block was performed with a total of 16 cc of 2% plain lidocaine. The uterus then sounded to 8 cm. The cervix was gradually dilated to a size 21 dilator without difficulty. The Myosure hysteroscope was inserted and good visualization was achieved with the above mentioned findings.  The Myosure Reach was inserted and the lesion was completely resected, as well as any visualized pieces of endometrial tissue.  Scissors were inserted and with 3 snips the adhesion was taken down and the cavity expanded. The endometrial cavity was now normal, no other visible lesions.  The hysteroscope was removed. The single-tooth tenaculum was removed from the cervix and bleeding was controlled with pressure and AgNO3. All instruments were then removed from the vagina. The patient tolerated the procedure well and was taken to the recovery in stable condition. Counts were correct and she had  PAS hose on throughout the procedure.

## 2020-09-05 NOTE — Interval H&P Note (Signed)
History and Physical Interval Note:  09/05/2020 7:16 AM  Tina [name]  has presented today for surgery, with the diagnosis of menorrhagia.  The various methods of treatment have been discussed with the patient and family. After consideration of risks, benefits and other options for treatment, the patient has consented to  Procedure(s): [place] (N/A) as a surgical intervention.  The patient's history has been reviewed, patient examined, no change in status, stable for surgery.  I have reviewed the patient's chart and labs.  Questions were answered to the patient's satisfaction.     Blane Ohara Abbiegail Landgren

## 2020-09-05 NOTE — Transfer of Care (Signed)
Immediate Anesthesia Transfer of Care Note  Patient: Tina [name]  Procedure(s) Performed: Procedure(s) (LRB): DILATATION & CURETTAGE/HYSTEROSCOPY WITH MYOSURE (N/A)  Patient Location: PACU  Anesthesia Type: General  Level of Consciousness: awake, sedated, patient cooperative and responds to stimulation  Airway & Oxygen Therapy: Patient Spontanous Breathing and Patient connected to [place] 02 and soft FM   Post-op Assessment: Report given to PACU RN, Post -op Vital signs reviewed and stable and Patient moving all extremities  Post vital signs: Reviewed and stable  Complications: No apparent anesthesia complications

## 2020-09-05 NOTE — Anesthesia Procedure Notes (Signed)
Procedure Name: LMA Insertion Date/Time: 09/05/2020 7:35 AM Performed by: Justice Rocher, CRNA Pre-anesthesia Checklist: Patient identified, Emergency Drugs available, Suction available, Patient being monitored and Timeout performed Patient Re-evaluated:Patient Re-evaluated prior to induction Oxygen Delivery Method: Circle system utilized Preoxygenation: Pre-oxygenation with 100% oxygen Induction Type: IV induction Ventilation: Mask ventilation without difficulty LMA: LMA inserted LMA Size: 4.0 Number of attempts: 1 Airway Equipment and Method: Bite block Placement Confirmation: positive ETCO2, CO2 detector and breath sounds checked- equal and bilateral Tube secured with: Tape Dental Injury: Teeth and Oropharynx as per pre-operative assessment

## 2020-09-06 ENCOUNTER — Encounter (HOSPITAL_BASED_OUTPATIENT_CLINIC_OR_DEPARTMENT_OTHER): Payer: Self-pay | Admitting: Obstetrics and Gynecology

## 2020-09-06 LAB — SURGICAL PATHOLOGY

## 2020-09-18 ENCOUNTER — Other Ambulatory Visit: Payer: Self-pay | Admitting: Physician Assistant

## 2020-09-18 DIAGNOSIS — M7631 Iliotibial band syndrome, right leg: Secondary | ICD-10-CM

## 2020-09-18 DIAGNOSIS — M5416 Radiculopathy, lumbar region: Secondary | ICD-10-CM

## 2020-09-18 NOTE — Progress Notes (Signed)
Right hip snapping with right leg weakness and positive trendelenburg   negative xray of hip and pelvis   significant chronic lumbar radiculopathy with old lumbar surgery for herniated disc

## 2020-10-02 ENCOUNTER — Ambulatory Visit: Payer: Medicaid Other | Attending: Physician Assistant

## 2020-10-02 ENCOUNTER — Other Ambulatory Visit: Payer: Self-pay

## 2020-10-02 DIAGNOSIS — M25651 Stiffness of right hip, not elsewhere classified: Secondary | ICD-10-CM | POA: Diagnosis present

## 2020-10-02 DIAGNOSIS — G8929 Other chronic pain: Secondary | ICD-10-CM | POA: Diagnosis present

## 2020-10-02 DIAGNOSIS — M6281 Muscle weakness (generalized): Secondary | ICD-10-CM | POA: Insufficient documentation

## 2020-10-02 DIAGNOSIS — M545 Low back pain, unspecified: Secondary | ICD-10-CM | POA: Diagnosis present

## 2020-10-02 DIAGNOSIS — M25551 Pain in right hip: Secondary | ICD-10-CM | POA: Insufficient documentation

## 2020-10-02 NOTE — Therapy (Addendum)
Tina [name], [place], [address] Phone: [phone]   Fax:  [phone]  Physical Therapy Evaluation  Patient Details  Name: Tina [name] MRN: [id number] Date of Birth: 03-Jun-1980 Referring Provider (PT): Matthew Saras, [place]  Encounter Date: 10/02/2020   PT End of Session - 10/02/20 1349     Visit Number 1    Number of Visits 9    Date for PT Re-Evaluation 11/27/20    Authorization Type [place] [facility]    Authorization Time Period Pending Authorization    PT Start Time 1300    PT Stop Time 1345    PT Time Calculation (min) 45 min    Activity Tolerance Patient tolerated treatment well    Behavior During Therapy [facility] for tasks assessed/performed             Past Medical History:  Diagnosis Date   Allergic rhinitis    Anxiety    Arthritis    In her back and legs per pt, 09/03/2020   Bipolar disorder ([place])    Diagnosised 10 yrs ago doesn't take any meds 09/03/2020   Chronic thoracic back pain    GERD (gastroesophageal reflux disease)    History of gestational diabetes    Hyperlipidemia    Major depression    Menorrhagia    Migraine    Smokers' cough (HCC)    albuterol (PROVENTIL HFA;VENTOLIN HFA)   Wears glasses    09/03/2020    Past Surgical History:  Procedure Laterality Date   BIOPSY THYROID  06/06/2016   LEFT THYROIDECTOMY   CESAREAN SECTION  05/21/2004   CESAREAN SECTION WITH BILATERAL TUBAL LIGATION Bilateral 08/29/2014   Procedure: CESAREAN SECTION WITH BILATERAL TUBAL LIGATION;  Surgeon: Cheri Fowler, MD;  Location: [facility];  Service: Obstetrics;  Laterality: Bilateral;  MD requests RNFA [name] confirmed 7/29...tms   CHOLECYSTECTOMY N/A 03/26/2015   Procedure: LAPAROSCOPIC CHOLECYSTECTOMY;  Surgeon: Arta Bruce Kinsinger, MD;  Location: [facility];  Service: General;  Laterality: N/A;   [place] N/A 09/05/2020   Procedure: DILATATION &  CURETTAGE/HYSTEROSCOPY WITH MYOSURE;  Surgeon: Cheri Fowler, MD;  Location: [facility];  Service: Gynecology;  Laterality: N/A;   DILITATION & CURRETTAGE/HYSTROSCOPY WITH NOVASURE ABLATION N/A 01/28/2016   Procedure: DILATATION & CURETTAGE/HYSTEROSCOPY WITH NOVASURE ABLATION;  Surgeon: Cheri Fowler, MD;  Location: [facility];  Service: Gynecology;  Laterality: N/A;   GASTRIC BYPASS     done in 2021, 09/03/2020   LUMBAR MICRODISCECTOMY  11/17/2007   L5-  S1   THYROIDECTOMY Left 06/06/2016   Procedure: LEFT THYROIDECTOMY AND CORE BIOPSY OF RIGHT THYROID;  Surgeon: Melida Quitter, MD;  Location: [facility];  Service: ENT;  Laterality: Left;   TONSILLECTOMY  2001   TUBAL LIGATION      There were no vitals filed for this visit.    Subjective Assessment - 10/02/20 1306     Subjective Pt reports primary c/o Subacute R lateral hip pain and "popping" of insidious onset lasting about 3-4 weeks. She reports the popping is mostly when walking or with certain hip movements. She reports that her pain has become more prevalent at all times, and she describes the pain as a deep localized soreness and points to her R greater trochanter. She also reports recent onset of LBP that she reports is due to standing/ moving differently since the onset of her hip pain. She reports current pain of 7/10, worst pain of 9/10, and  best pain of 3/10. Easing factors include rest, positional change, and Tylenol/ ibuprofen. Aggravating factors include walking and general hip movement. 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 She reports walking 8,000-15,000 steps per day. She denies any bowel/ bladder changes, N/T, saddle anesthesia, unexplained weight change, unrelenting night pain, or nause/ vomiting.    Pertinent History L5-S1 lumbar miscodiscectomy 2009    Limitations Standing;Walking;Sitting    How long can you sit comfortably? 15-20 minutes    How long can  you stand comfortably? 10-15 minutes    How long can you walk comfortably? 20 minutes    Diagnostic tests Pt reports having R hip X-rays done 2-3 weeks ago; she reports no abnormal/ bony findings    Patient Stated Goals work, walk for exercise, stop the popping    Currently in Pain? Yes    Pain Score 7     Pain Location Hip    Pain Orientation Right    Pain Descriptors / Indicators Aching    Pain Type Acute pain    Pain Onset 1 to 4 weeks ago    Pain Frequency Intermittent    Aggravating Factors  walking, prolonged standing, prolonged sitting    Pain Relieving Factors rest, positional change    Effect of Pain on Daily Activities Pt reports pain at work and when walking.    Multiple Pain Sites No                [facility] PT Assessment - 10/02/20 0001       Assessment   Medical Diagnosis Iliotibial band syndrome affecting right lower leg (M76.31), Lumbar radiculopathy, right (M54.16)    Referring Provider (PT) Levester Fresh, Kirstin, PA-C    Onset Date/Surgical Date 09/11/20    Hand Dominance Right    Next MD Visit None    Prior Therapy Yes, for low back      Precautions   Precautions None      Restrictions   Weight Bearing Restrictions No      Balance Screen   Has the patient fallen in the past 6 months No    Has the patient had a decrease in activity level because of a fear of falling?  No    Is the patient reluctant to leave their home because of a fear of falling?  No      Home Environment   Living Environment Private residence    Living Arrangements Children;Parent    Available Help at Discharge Family    Type of [place] to enter    Entrance Stairs-Number of Steps 3    Entrance Stairs-Rails Right;Left;Can reach both    [place] One level    [place] None      Prior Function   Level of Independence Independent    Vocation Full time employment    Magazine features editor, standing/ walking x15 hours per day     Leisure Walking for exercise, working out      [employer]   Overall Cognitive Status Within Functional Limits for tasks assessed      Observation/Other Assessments   Observations Mild swelling observed at R lateral hip      Functional Tests   Functional tests Squat;Single leg stance      Squat   Comments WNL      Single Leg Stance   Comments x15sec: L WNL; R 3 step errors  AROM   Right Hip Extension 17    Right Hip Flexion 105    Right Hip External Rotation  30    Right Hip Internal Rotation  35    Right Hip ABduction 46    Right Hip ADduction 20    Left Hip Extension 25    Left Hip Flexion 120    Left Hip External Rotation  24    Left Hip Internal Rotation  40    Left Hip ABduction 50    Left Hip ADduction 20    Lumbar Flexion WNL    Lumbar Extension WNL    Lumbar - Right Side Bend WNL    Lumbar - Left Side Bend WNL    Lumbar - Right Rotation WNL    Lumbar - Left Rotation WNL      Strength   Right Hip Flexion 3/5    Right Hip Extension 3+/5    Right Hip External Rotation  5/5    Right Hip Internal Rotation 4+/5   R hip soreness   Right Hip ABduction 3+/5    Right Hip ADduction 3/5    Left Hip Flexion 3/5    Left Hip Extension 3+/5    Left Hip External Rotation 5/5    Left Hip Internal Rotation 5/5    Left Hip ABduction 3+/5    Left Hip ADduction 3/5      Flexibility   Soft Tissue Assessment /Muscle Length yes    Hamstrings 90/90 hamstring test: 18d shy of full extension on L, 40d shy on R    ITB Severely limited BIL      Palpation   Spinal mobility CPA's hypomobile and painful at T9-T12, hypomobile at L2-L4    Palpation comment TTP to R greater trochanter/ inferior to greater troch      Special Tests    Special Tests Hip Special Tests    Hip Special Tests  Saralyn Pilar (FABER) Test;Trendelenberg Test;Thomas Test;Ober's Test      Saralyn Pilar [facility]) Test   Findings Positive    Side Right      Trendelenburg Test   Findings Positive    Side Right       Thomas Test    Findings Positive    Side Right;Left    Comments Severely limited      Ober's Test   Findings Positive    Side Right;Left    Comments Severely limited BIL                        Objective measurements completed on examination: See above findings.     Screening for Suicide  Answer the following questions with Yes or No and place an "x" beside the action taken.  1. Over the past two weeks, have you felt down, depressed, or hopeless?   No  2. Within the past two weeks, have you felt little interest or pleasure in life?  No  If YES to either #1 or #2, then ask #3  3. Have you had thoughts that that life is not worth living or that you might be       better off dead?   N/A  If answer is NO and suspicion is low, then end   4. Over this past week, have you had any thoughts about hurting or even killing yourself?  N/A  If NO, then end. Patient in no immediate danger   5. If so, do you believe that you intend to  or will harm yourself?  N/A     If NO, then end. Patient in no immediate danger   6.  Do you have a plan as to how you would hurt yourself?  N/A   7.  Over this past week, have you actually done anything to hurt yourself?  N/A   IF YES answers to either #4, #5, #6 or #7, then patient is AT RISK for suicide   Actions Taken  __X__  Screening negative; no further action required  ____  Screening positive; no immediate danger and patient already in treatment with a  mental health provider. Advise patient to speak to their mental health provider.  ____  Screening positive; no immediate danger. Patient advised to contact a mental  health provider for further assessment.   ____  Screening positive; in immediate danger as patient states intention of killing self,  has plan and a sense of imminence. Do not leave alone. Seek permission from  patient to contact a family member to inform them. Direct patient to go to ED.             PT Education - 10/02/20 1349     Education Details Pt educated on probably underlying physiology to her pain presentation, POC, and correct form with HEP exercises.    Person(s) Educated Patient    Methods Explanation;Demonstration;Handout    Comprehension Verbalized understanding;Returned demonstration              PT Short Term Goals - 10/02/20 1359       PT SHORT TERM GOAL #1   Title Pt will report understanding and adherence to her HEP in order to promote independence in the management of her primary sxs.    Baseline HEP given at eval    Time 4    Period Weeks    Status New    Target Date 10/30/20               PT Long Term Goals - 10/02/20 1429       PT LONG TERM GOAL #1   Title Pt will report no popping in her right hip x 2 weeks in order to not be limited when walking for exercise.    Baseline Pt reports regular R hip popping when walking    Time 8    Period Weeks    Status New    Target Date 11/27/20      PT LONG TERM GOAL #2   Title Pt will achieve BIL global hip strength of 4+/5 or greater in order to progress her independent LE strengthening regimen without limitation.    Baseline See flowsheet    Time 8    Period Weeks    Status New    Target Date 11/27/20      PT LONG TERM GOAL #3   Title Pt will achieve R flexion and extension AROM equal to that of the L in order to promote normal gait and reduce compensations made by the lumbar spine during stance and gait.    Baseline See flowsheet    Time 8    Period Weeks    Status New    Target Date 11/27/20      PT LONG TERM GOAL #4   Title Pt will report ability to walk >30 minutes with 0-2/10 pain in order to grocery shop without limitation.    Baseline Unable to walk >20 minutes without >6/10 pain    Time 8    Period  Weeks    Status New    Target Date 11/27/20      PT LONG TERM GOAL #5   Title Pt will report ability to stand >1 hour with 0-2/10 pain in order to work without  difficulty.    Baseline Unable to stand > 10-15 minutes without >6/10 pain    Time 8    Period Weeks    Status New    Target Date 11/27/20                    Plan - 10/02/20 1350     Clinical Impression Statement Pt is a pleasant 40yo F who presents with primary c/o subacute R hip pain and popping. Upon assessment, her primary impairments include severe limitation in BIL IT band extensibility, moderately limited R hamstring extensibility, hypomobile and painful lower thoracic passive accessories, hypomobile passive accessories, limited R hip flexion and extension AROM, weak BIL global hip musculature, and TTP to R greater trochanter and just inferior to the R greater trochanter. Her sxs, along with positive special testing are indicative of probably R snapping hip/ coxa sultans. She will benefit from skilled PT to address her primary impairments and return to her prior level of function without limitation.    Personal Factors and Comorbidities Comorbidity 3+    Comorbidities See medical hx    Examination-Activity Limitations Locomotion Level;Sit;Sleep;Stand    Examination-Participation Restrictions Occupation;Community Activity;Interpersonal Relationship    Stability/Clinical Decision Making Evolving/Moderate complexity    Clinical Decision Making Moderate    Rehab Potential Good    PT Frequency 1x / week    PT Duration 8 weeks    PT Treatment/Interventions ADLs/Self Care Home Management;Cryotherapy;Electrical Stimulation;Ultrasound;Moist Heat;Iontophoresis 4mg /ml Dexamethasone;Gait training;Stair training;Therapeutic activities;Therapeutic exercise;Balance training;Neuromuscular re-education;Manual techniques;Joint Manipulations;Dry needling;Passive range of motion;Patient/family education;Taping    PT Next Visit Plan Progress hip strengthening/ IT band stretching program, introduce walking program PRN    PT Home Exercise Plan [id number]    Consulted and Agree with Plan of Care  Patient             Patient will benefit from skilled therapeutic intervention in order to improve the following deficits and impairments:  Abnormal gait, Difficulty walking, Decreased range of motion, Increased fascial restricitons, Pain, Hypomobility, Improper body mechanics, Postural dysfunction, Increased edema, Decreased strength, Decreased mobility, Decreased balance  Visit Diagnosis: Pain in right hip  Stiffness of right hip, not elsewhere classified  Chronic bilateral low back pain without sciatica  Muscle weakness (generalized)     Problem List Patient Active Problem List   Diagnosis Date Noted   Thyroid nodule 06/06/2016   S/P cesarean section 08/29/2014   Venereal disease, unspecified 06/23/2012   Cervicalgia 04/21/2012   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   Postlaminectomy syndrome, lumbar region 06/26/2011   Lumbago 05/09/2011   DJD (degenerative joint disease), lumbar 05/08/2011   Seasonal and perennial allergic rhinitis 05/08/2011   Sacroiliac dysfunction 04/07/2011   Lumbosacral spondylosis without myelopathy 03/10/2011   Dizziness and giddiness 11/02/2010   Preventative health care 04/26/2010   SMOKER 03/29/2010   HYPERSOMNIA 02/08/2010   HYPERLIPIDEMIA 04/26/2009   ADD 04/26/2009   Pruritus ani 04/26/2009   ANXIETY 02/01/2009   DEPRESSION 02/01/2009   COMMON MIGRAINE 02/01/2009   GERD 02/01/2009   CHOLELITHIASIS 02/01/2009    Vanessa Terril, PT, DPT 10/02/20 2:47 PM   [facility] [address] [place], [place], [address] Phone: [phone]   Fax:  [phone]  Name: Naomy Esham MRN:  [id number] Date of Birth: 01-14-1980  Check all possible CPT codes: 02409- Therapeutic Exercise, [phone]- Neuro Re-education, [phone] - Gait Training, [phone] - Manual Therapy, [phone] - Therapeutic Activities, and [phone] - [place]  Vanessa Ivalee, PT, DPT 10/05/20 9:33 AM

## 2020-10-02 NOTE — Patient Instructions (Signed)
  [id number]

## 2020-10-10 ENCOUNTER — Ambulatory Visit: Payer: Medicaid Other

## 2020-10-10 ENCOUNTER — Other Ambulatory Visit: Payer: Self-pay

## 2020-10-10 DIAGNOSIS — G8929 Other chronic pain: Secondary | ICD-10-CM

## 2020-10-10 DIAGNOSIS — M25551 Pain in right hip: Secondary | ICD-10-CM | POA: Diagnosis not present

## 2020-10-10 DIAGNOSIS — M25651 Stiffness of right hip, not elsewhere classified: Secondary | ICD-10-CM

## 2020-10-10 DIAGNOSIS — M545 Low back pain, unspecified: Secondary | ICD-10-CM

## 2020-10-10 DIAGNOSIS — M6281 Muscle weakness (generalized): Secondary | ICD-10-CM

## 2020-10-10 NOTE — Therapy (Addendum)
[place], [place], [address] Phone: [phone]   Fax:  [phone]  Physical Therapy Treatment / Discharge  Patient Details  Name: Tina [name] MRN: [id number] Date of Birth: [birth year] Referring Provider (PT): Matthew Saras, [place]   Encounter Date: 10/10/2020   PT End of Session - 10/10/20 1825     Visit Number 2    Number of Visits 9    Date for PT Re-Evaluation 11/27/20    Authorization Type [employer]    Authorization Time Period Pending Auth, submitted 10/05/2020    PT Start Time 1745    PT Stop Time 1825    PT Time Calculation (min) 40 min    Activity Tolerance Patient tolerated treatment well    Behavior During Therapy [facility] for tasks assessed/performed             Past Medical History:  Diagnosis Date   Allergic rhinitis    Anxiety    Arthritis    In her back and legs per pt, 09/03/2020   Bipolar disorder ([place])    Diagnosised 10 yrs ago doesn't take any meds 09/03/2020   Chronic thoracic back pain    GERD (gastroesophageal reflux disease)    History of gestational diabetes    Hyperlipidemia    Major depression    Menorrhagia    Migraine    Smokers' cough ([place])    albuterol (PROVENTIL HFA;VENTOLIN HFA)   Wears glasses    09/03/2020    Past Surgical History:  Procedure Laterality Date   BIOPSY THYROID  06/06/2016   LEFT THYROIDECTOMY   CESAREAN SECTION  05/21/2004   CESAREAN SECTION WITH BILATERAL TUBAL LIGATION Bilateral 08/29/2014   Procedure: CESAREAN SECTION WITH BILATERAL TUBAL LIGATION;  Surgeon: Cheri Fowler, MD;  Location: [facility];  Service: Obstetrics;  Laterality: Bilateral;  MD requests RNFA [name] confirmed 7/29...tms   CHOLECYSTECTOMY N/A 03/26/2015   Procedure: LAPAROSCOPIC CHOLECYSTECTOMY;  Surgeon: Arta Bruce Kinsinger, MD;  Location: [facility];  Service: General;  Laterality: N/A;   [place] N/A  09/05/2020   Procedure: DILATATION & CURETTAGE/HYSTEROSCOPY WITH MYOSURE;  Surgeon: Cheri Fowler, MD;  Location: [facility];  Service: Gynecology;  Laterality: N/A;   DILITATION & CURRETTAGE/HYSTROSCOPY WITH NOVASURE ABLATION N/A 01/28/2016   Procedure: DILATATION & CURETTAGE/HYSTEROSCOPY WITH NOVASURE ABLATION;  Surgeon: Cheri Fowler, MD;  Location: [facility];  Service: Gynecology;  Laterality: N/A;   GASTRIC BYPASS     done in 2021, 09/03/2020   LUMBAR MICRODISCECTOMY  11/17/2007   L5-  S1   THYROIDECTOMY Left 06/06/2016   Procedure: LEFT THYROIDECTOMY AND CORE BIOPSY OF RIGHT THYROID;  Surgeon: Melida Quitter, MD;  Location: [facility];  Service: ENT;  Laterality: Left;   TONSILLECTOMY  2001   TUBAL LIGATION      There were no vitals filed for this visit.   Subjective Assessment - 10/10/20 1745     Subjective Pt reports varied HEP adherence due to long work hours. She adds that she was able to walk 1 mile at the gym twice since her eval with no significant difficulty, although she does have soreness at this time.    Currently in Pain? Yes    Pain Score 7     Pain Location Hip    Pain Orientation Right    Pain Descriptors / Indicators Aching;Sore    Pain Type Acute pain    Pain Onset 1 to 4 weeks  ago    Pain Frequency Intermittent                               [facility] Adult PT Treatment/Exercise - 10/10/20 0001       Knee/Hip Exercises: Stretches   ITB Stretch Right    ITB Stretch Limitations Supine with green strap 3x30sec      Knee/Hip Exercises: Machines for Strengthening   Cybex Leg Press 3x10 at 80#    Hip Cybex abduction and extension 2x10 BIL each at 30#      Knee/Hip Exercises: Standing   Other Standing Knee Exercises Deadlift with barbell and 10# 3x8      Knee/Hip Exercises: Supine   Bridges Limitations Bridge with march 3x10 BIL      Manual Therapy   Manual Therapy Joint mobilization;Manual Traction    Joint  Mobilization Grade V long-axis hip distraction manipulation with cavitation x1 on R    Manual Traction Lateral hip distraction on R with mobilization belt x5 minutes                     PT Education - 10/10/20 1824     Education Details Updated HEP    Person(s) Educated Patient    Methods Explanation;Demonstration;Handout    Comprehension Verbalized understanding;Returned demonstration              PT Short Term Goals - 10/02/20 1359       PT SHORT TERM GOAL #1   Title Pt will report understanding and adherence to her HEP in order to promote independence in the management of her primary sxs.    Baseline HEP given at eval    Time 4    Period Weeks    Status New    Target Date 10/30/20               PT Long Term Goals - 10/02/20 1429       PT LONG TERM GOAL #1   Title Pt will report no popping in her right hip x 2 weeks in order to not be limited when walking for exercise.    Baseline Pt reports regular R hip popping when walking    Time 8    Period Weeks    Status New    Target Date 11/27/20      PT LONG TERM GOAL #2   Title Pt will achieve BIL global hip strength of 4+/5 or greater in order to progress her independent LE strengthening regimen without limitation.    Baseline See flowsheet    Time 8    Period Weeks    Status New    Target Date 11/27/20      PT LONG TERM GOAL #3   Title Pt will achieve R flexion and extension AROM equal to that of the L in order to promote normal gait and reduce compensations made by the lumbar spine during stance and gait.    Baseline See flowsheet    Time 8    Period Weeks    Status New    Target Date 11/27/20      PT LONG TERM GOAL #4   Title Pt will report ability to walk >30 minutes with 0-2/10 pain in order to grocery shop without limitation.    Baseline Unable to walk >20 minutes without >6/10 pain    Time 8    Period Weeks    Status New  Target Date 11/27/20      PT LONG TERM GOAL #5   Title Pt  will report ability to stand >1 hour with 0-2/10 pain in order to work without difficulty.    Baseline Unable to stand > 10-15 minutes without >6/10 pain    Time 8    Period Weeks    Status New    Target Date 11/27/20                   Plan - 10/10/20 1826     Clinical Impression Statement Pt responded well to all interventions today, demonstrating proper form and no increase in pain with all selected exercises. Also, she responded very well to long axis his distraction grade V manipulation and lateral hip distraction, reporting a decrease in pain from 7/10 to 5/10 in-session. She will continue to benefit from skilled PT to address her primary impairments and return to her prior level of function without limitation    Personal Factors and Comorbidities Comorbidity 3+    Comorbidities See medical hx    Examination-Activity Limitations Locomotion Level;Sit;Sleep;Stand    Examination-Participation Restrictions Occupation;Community Activity;Interpersonal Relationship    Stability/Clinical Decision Making Evolving/Moderate complexity    Clinical Decision Making Moderate    Rehab Potential Good    PT Frequency 1x / week    PT Duration 8 weeks    PT Treatment/Interventions ADLs/Self Care Home Management;Cryotherapy;Electrical Stimulation;Ultrasound;Moist Heat;Iontophoresis 61m/ml Dexamethasone;Gait training;Stair training;Therapeutic activities;Therapeutic exercise;Balance training;Neuromuscular re-education;Manual techniques;Joint Manipulations;Dry needling;Passive range of motion;Patient/family education;Taping    PT Next Visit Plan Progress hip strengthening/ IT band stretching program, introduce walking program PRN    PT Home Exercise Plan [id number]    Consulted and Agree with Plan of Care Patient             Patient will benefit from skilled therapeutic intervention in order to improve the following deficits and impairments:  Abnormal gait, Difficulty walking, Decreased range of  motion, Increased fascial restricitons, Pain, Hypomobility, Improper body mechanics, Postural dysfunction, Increased edema, Decreased strength, Decreased mobility, Decreased balance  Visit Diagnosis: Pain in right hip  Stiffness of right hip, not elsewhere classified  Chronic bilateral low back pain without sciatica  Muscle weakness (generalized)     Problem List Patient Active Problem List   Diagnosis Date Noted   Thyroid nodule 06/06/2016   S/P cesarean section 08/29/2014   Venereal disease, unspecified 06/23/2012   Cervicalgia 04/21/2012   [place]IgE [place] 10/26/2011   Postlaminectomy syndrome, lumbar region 06/26/2011   Lumbago 05/09/2011   DJD (degenerative joint disease), lumbar 05/08/2011   Seasonal and perennial allergic rhinitis 05/08/2011   Sacroiliac dysfunction 04/07/2011   Lumbosacral spondylosis without myelopathy 03/10/2011   Dizziness and giddiness 11/02/2010   Preventative health care 04/26/2010   SMOKER 03/29/2010   HYPERSOMNIA 02/08/2010   HYPERLIPIDEMIA 04/26/2009   ADD 04/26/2009   Pruritus ani 04/26/2009   ANXIETY 02/01/2009   DEPRESSION 02/01/2009   COMMON MIGRAINE 02/01/2009   GERD 02/01/2009   CHOLELITHIASIS 02/01/2009    YVanessa [place] PT, DPT 10/10/20 6:29 PM   [facility] [address]Phone: [phone]  Fax:  [phone] Name: Tina WhitworthMRN: [id number] of Birth: [birth year]  PHYSICAL THERAPY DISCHARGE SUMMARY  Visits from Start of Care: 2  Current functional level related to goals / functional outcomes: See above   Remaining deficits: See above   Education / Equipment: HEP   Patient agrees to discharge. Patient  goals were not met. Patient is being discharged due to not returning since the last visit.  Hilda Blades, PT, DPT, LAT, ATC 11/21/20  5:09 PM Phone: [phone] Fax: [phone]

## 2020-10-10 NOTE — Patient Instructions (Signed)
  [id number]

## 2020-10-15 ENCOUNTER — Ambulatory Visit: Payer: Medicaid Other

## 2020-10-24 ENCOUNTER — Ambulatory Visit: Payer: Medicaid Other

## 2020-10-29 ENCOUNTER — Telehealth: Payer: Self-pay

## 2020-10-29 ENCOUNTER — Ambulatory Visit: Payer: Medicaid Other | Attending: Physician Assistant

## 2020-10-29 NOTE — Telephone Encounter (Signed)
Spoke to pt regarding her 1st no-show. She was instructed to call the clinic back to schedule future appointments since none are currently scheduled. She was provided the clinic phone number and instructed on the clinic's attendance policy.

## 2021-09-01 DIAGNOSIS — R198 Other specified symptoms and signs involving the digestive system and abdomen: Secondary | ICD-10-CM | POA: Diagnosis present

## 2021-11-26 ENCOUNTER — Encounter (HOSPITAL_COMMUNITY): Payer: Self-pay

## 2021-11-26 ENCOUNTER — Emergency Department (HOSPITAL_COMMUNITY): Payer: Medicaid Other | Admitting: Certified Registered Nurse Anesthetist

## 2021-11-26 ENCOUNTER — Emergency Department (HOSPITAL_COMMUNITY): Payer: Medicaid Other

## 2021-11-26 ENCOUNTER — Inpatient Hospital Stay (HOSPITAL_COMMUNITY)
Admission: EM | Admit: 2021-11-26 | Discharge: 2021-12-01 | DRG: 326 | Discharge status: Against Medical Advice | Payer: Medicaid Other | Attending: Surgery | Admitting: Surgery

## 2021-11-26 ENCOUNTER — Encounter (HOSPITAL_COMMUNITY): Admission: EM | Discharge status: Against Medical Advice | Payer: Self-pay | Source: Home / Self Care

## 2021-11-26 DIAGNOSIS — K255 Chronic or unspecified gastric ulcer with perforation: Secondary | ICD-10-CM

## 2021-11-26 DIAGNOSIS — M549 Dorsalgia, unspecified: Secondary | ICD-10-CM | POA: Diagnosis present

## 2021-11-26 DIAGNOSIS — F17210 Nicotine dependence, cigarettes, uncomplicated: Secondary | ICD-10-CM | POA: Diagnosis present

## 2021-11-26 DIAGNOSIS — Z8249 Family history of ischemic heart disease and other diseases of the circulatory system: Secondary | ICD-10-CM

## 2021-11-26 DIAGNOSIS — K285 Chronic or unspecified gastrojejunal ulcer with perforation: Secondary | ICD-10-CM | POA: Diagnosis present

## 2021-11-26 DIAGNOSIS — F419 Anxiety disorder, unspecified: Secondary | ICD-10-CM | POA: Diagnosis present

## 2021-11-26 DIAGNOSIS — E785 Hyperlipidemia, unspecified: Secondary | ICD-10-CM | POA: Diagnosis present

## 2021-11-26 DIAGNOSIS — E876 Hypokalemia: Secondary | ICD-10-CM | POA: Diagnosis present

## 2021-11-26 DIAGNOSIS — Z885 Allergy status to narcotic agent status: Secondary | ICD-10-CM | POA: Diagnosis not present

## 2021-11-26 DIAGNOSIS — K219 Gastro-esophageal reflux disease without esophagitis: Secondary | ICD-10-CM | POA: Diagnosis present

## 2021-11-26 DIAGNOSIS — Z5329 Procedure and treatment not carried out because of patient's decision for other reasons: Secondary | ICD-10-CM | POA: Diagnosis present

## 2021-11-26 DIAGNOSIS — E440 Moderate protein-calorie malnutrition: Secondary | ICD-10-CM | POA: Diagnosis present

## 2021-11-26 DIAGNOSIS — K9589 Other complications of other bariatric procedure: Secondary | ICD-10-CM | POA: Diagnosis present

## 2021-11-26 DIAGNOSIS — Y838 Other surgical procedures as the cause of abnormal reaction of the patient, or of later complication, without mention of misadventure at the time of the procedure: Secondary | ICD-10-CM | POA: Diagnosis present

## 2021-11-26 DIAGNOSIS — Z87891 Personal history of nicotine dependence: Secondary | ICD-10-CM

## 2021-11-26 DIAGNOSIS — Z83438 Family history of other disorder of lipoprotein metabolism and other lipidemia: Secondary | ICD-10-CM | POA: Diagnosis not present

## 2021-11-26 DIAGNOSIS — Z803 Family history of malignant neoplasm of breast: Secondary | ICD-10-CM | POA: Diagnosis not present

## 2021-11-26 DIAGNOSIS — Z888 Allergy status to other drugs, medicaments and biological substances status: Secondary | ICD-10-CM | POA: Diagnosis not present

## 2021-11-26 DIAGNOSIS — Z800 Family history of malignant neoplasm of digestive organs: Secondary | ICD-10-CM | POA: Diagnosis not present

## 2021-11-26 DIAGNOSIS — E890 Postprocedural hypothyroidism: Secondary | ICD-10-CM | POA: Diagnosis present

## 2021-11-26 DIAGNOSIS — Z9884 Bariatric surgery status: Secondary | ICD-10-CM | POA: Diagnosis not present

## 2021-11-26 DIAGNOSIS — K668 Other specified disorders of peritoneum: Secondary | ICD-10-CM | POA: Diagnosis present

## 2021-11-26 DIAGNOSIS — Z818 Family history of other mental and behavioral disorders: Secondary | ICD-10-CM

## 2021-11-26 DIAGNOSIS — F418 Other specified anxiety disorders: Secondary | ICD-10-CM | POA: Diagnosis not present

## 2021-11-26 DIAGNOSIS — Z8632 Personal history of gestational diabetes: Secondary | ICD-10-CM

## 2021-11-26 DIAGNOSIS — F1010 Alcohol abuse, uncomplicated: Secondary | ICD-10-CM | POA: Diagnosis present

## 2021-11-26 DIAGNOSIS — G8929 Other chronic pain: Secondary | ICD-10-CM | POA: Diagnosis present

## 2021-11-26 DIAGNOSIS — Z6820 Body mass index (BMI) 20.0-20.9, adult: Secondary | ICD-10-CM

## 2021-11-26 DIAGNOSIS — M1990 Unspecified osteoarthritis, unspecified site: Secondary | ICD-10-CM

## 2021-11-26 DIAGNOSIS — Z833 Family history of diabetes mellitus: Secondary | ICD-10-CM

## 2021-11-26 DIAGNOSIS — F319 Bipolar disorder, unspecified: Secondary | ICD-10-CM | POA: Diagnosis present

## 2021-11-26 DIAGNOSIS — K275 Chronic or unspecified peptic ulcer, site unspecified, with perforation: Secondary | ICD-10-CM | POA: Diagnosis present

## 2021-11-26 HISTORY — PX: LAPAROTOMY: SHX154

## 2021-11-26 LAB — CBC WITH DIFFERENTIAL/PLATELET
Abs Immature Granulocytes: 0 10*3/uL (ref 0.00–0.07)
Basophils Absolute: 0 10*3/uL (ref 0.0–0.1)
Basophils Relative: 0 %
Eosinophils Absolute: 0 10*3/uL (ref 0.0–0.5)
Eosinophils Relative: 0 %
HCT: 43.2 % (ref 36.0–46.0)
Hemoglobin: 13.7 g/dL (ref 12.0–15.0)
Immature Granulocytes: 0 %
Lymphocytes Relative: 18 %
Lymphs Abs: 0.9 10*3/uL (ref 0.7–4.0)
MCH: 27.3 pg (ref 26.0–34.0)
MCHC: 31.7 g/dL (ref 30.0–36.0)
MCV: 86.1 fL (ref 80.0–100.0)
Monocytes Absolute: 0.2 10*3/uL (ref 0.1–1.0)
Monocytes Relative: 4 %
Neutro Abs: 3.6 10*3/uL (ref 1.7–7.7)
Neutrophils Relative %: 78 %
Platelets: 309 10*3/uL (ref 150–400)
RBC: 5.02 MIL/uL (ref 3.87–5.11)
RDW: 12.9 % (ref 11.5–15.5)
WBC: 4.6 10*3/uL (ref 4.0–10.5)
nRBC: 0 % (ref 0.0–0.2)

## 2021-11-26 LAB — BLOOD GAS, VENOUS
Acid-Base Excess: 2.2 mmol/L — ABNORMAL HIGH (ref 0.0–2.0)
Bicarbonate: 29.1 mmol/L — ABNORMAL HIGH (ref 20.0–28.0)
O2 Saturation: 73.7 %
Patient temperature: 37
pCO2, Ven: 54 mmHg (ref 44–60)
pH, Ven: 7.34 (ref 7.25–7.43)
pO2, Ven: 43 mmHg (ref 32–45)

## 2021-11-26 LAB — I-STAT CHEM 8, ED
BUN: 6 mg/dL (ref 6–20)
Calcium, Ion: 1.11 mmol/L — ABNORMAL LOW (ref 1.15–1.40)
Chloride: 98 mmol/L (ref 98–111)
Creatinine, Ser: 0.5 mg/dL (ref 0.44–1.00)
Glucose, Bld: 118 mg/dL — ABNORMAL HIGH (ref 70–99)
HCT: 41 % (ref 36.0–46.0)
Hemoglobin: 13.9 g/dL (ref 12.0–15.0)
Potassium: 2.8 mmol/L — ABNORMAL LOW (ref 3.5–5.1)
Sodium: 138 mmol/L (ref 135–145)
TCO2: 27 mmol/L (ref 22–32)

## 2021-11-26 LAB — COMPREHENSIVE METABOLIC PANEL
ALT: 16 U/L (ref 0–44)
AST: 20 U/L (ref 15–41)
Albumin: 3.1 g/dL — ABNORMAL LOW (ref 3.5–5.0)
Alkaline Phosphatase: 70 U/L (ref 38–126)
Anion gap: 7 (ref 5–15)
BUN: 8 mg/dL (ref 6–20)
CO2: 29 mmol/L (ref 22–32)
Calcium: 8.2 mg/dL — ABNORMAL LOW (ref 8.9–10.3)
Chloride: 104 mmol/L (ref 98–111)
Creatinine, Ser: 0.5 mg/dL (ref 0.44–1.00)
GFR, Estimated: >60 mL/min (ref >60)
Glucose, Bld: 123 mg/dL — ABNORMAL HIGH (ref 70–99)
Potassium: 2.8 mmol/L — ABNORMAL LOW (ref 3.5–5.1)
Sodium: 140 mmol/L (ref 135–145)
Total Bilirubin: 0.7 mg/dL (ref 0.3–1.2)
Total Protein: 6 g/dL — ABNORMAL LOW (ref 6.5–8.1)

## 2021-11-26 LAB — LIPASE, BLOOD: Lipase: 26 U/L (ref 11–51)

## 2021-11-26 LAB — I-STAT BETA HCG BLOOD, ED (MC, WL, AP ONLY): I-stat hCG, quantitative: <5 m[IU]/mL (ref <5)

## 2021-11-26 LAB — MAGNESIUM: Magnesium: 1.7 mg/dL (ref 1.7–2.4)

## 2021-11-26 LAB — LACTIC ACID, PLASMA: Lactic Acid, Venous: 1.2 mmol/L (ref 0.5–1.9)

## 2021-11-26 LAB — TYPE AND SCREEN
ABO/RH(D): O POS
Antibody Screen: NEGATIVE

## 2021-11-26 LAB — TROPONIN I (HIGH SENSITIVITY): Troponin I (High Sensitivity): 2 ng/L (ref <18)

## 2021-11-26 SURGERY — LAPAROTOMY, EXPLORATORY
Anesthesia: General | Site: Abdomen

## 2021-11-26 MED ORDER — ROCURONIUM BROMIDE 10 MG/ML (PF) SYRINGE
PREFILLED_SYRINGE | INTRAVENOUS | Status: DC | PRN
  Administered 2021-11-26: 40 mg via INTRAVENOUS

## 2021-11-26 MED ORDER — HYDROMORPHONE HCL 1 MG/ML IJ SOLN
0.2500 mg | INTRAMUSCULAR | Status: DC | PRN
  Administered 2021-11-26: 0.5 mg via INTRAVENOUS

## 2021-11-26 MED ORDER — 0.9 % SODIUM CHLORIDE (POUR BTL) OPTIME
TOPICAL | Status: DC | PRN
  Administered 2021-11-26: 5000 mL

## 2021-11-26 MED ORDER — FENTANYL CITRATE (PF) 100 MCG/2ML IJ SOLN
INTRAMUSCULAR | Status: DC | PRN
  Administered 2021-11-26: 100 ug via INTRAVENOUS

## 2021-11-26 MED ORDER — ROCURONIUM BROMIDE 10 MG/ML (PF) SYRINGE
PREFILLED_SYRINGE | INTRAVENOUS | Status: AC
  Filled 2021-11-26: qty 10

## 2021-11-26 MED ORDER — PROPOFOL 10 MG/ML IV BOLUS
INTRAVENOUS | Status: AC
  Filled 2021-11-26: qty 20

## 2021-11-26 MED ORDER — SUCCINYLCHOLINE CHLORIDE 200 MG/10ML IV SOSY
PREFILLED_SYRINGE | INTRAVENOUS | Status: DC | PRN
  Administered 2021-11-26: 80 mg via INTRAVENOUS

## 2021-11-26 MED ORDER — FENTANYL CITRATE PF 50 MCG/ML IJ SOSY
50.0000 ug | PREFILLED_SYRINGE | Freq: Once | INTRAMUSCULAR | Status: AC
  Administered 2021-11-26: 50 ug via INTRAVENOUS
  Filled 2021-11-26: qty 1

## 2021-11-26 MED ORDER — MIDAZOLAM HCL 5 MG/5ML IJ SOLN
INTRAMUSCULAR | Status: DC | PRN
  Administered 2021-11-26: 2 mg via INTRAVENOUS

## 2021-11-26 MED ORDER — DEXAMETHASONE SODIUM PHOSPHATE 10 MG/ML IJ SOLN
INTRAMUSCULAR | Status: AC
  Filled 2021-11-26: qty 1

## 2021-11-26 MED ORDER — LACTATED RINGERS IV BOLUS
1000.0000 mL | Freq: Once | INTRAVENOUS | Status: AC
  Administered 2021-11-26: 1000 mL via INTRAVENOUS

## 2021-11-26 MED ORDER — LACTATED RINGERS IV SOLN: INTRAVENOUS | Status: DC | PRN

## 2021-11-26 MED ORDER — POTASSIUM CHLORIDE CRYS ER 20 MEQ PO TBCR
40.0000 meq | EXTENDED_RELEASE_TABLET | Freq: Once | ORAL | Status: AC
  Administered 2021-11-26: 40 meq via ORAL
  Filled 2021-11-26: qty 2

## 2021-11-26 MED ORDER — PROMETHAZINE HCL 25 MG/ML IJ SOLN: 6.2500 mg | INTRAMUSCULAR | Status: DC | PRN

## 2021-11-26 MED ORDER — LIDOCAINE 2% (20 MG/ML) 5 ML SYRINGE
INTRAMUSCULAR | Status: DC | PRN
  Administered 2021-11-26: 80 mg via INTRAVENOUS

## 2021-11-26 MED ORDER — HYDROMORPHONE HCL 1 MG/ML IJ SOLN
INTRAMUSCULAR | Status: AC
  Filled 2021-11-26: qty 1

## 2021-11-26 MED ORDER — KETAMINE HCL 50 MG/5ML IJ SOSY
PREFILLED_SYRINGE | INTRAMUSCULAR | Status: AC
  Filled 2021-11-26: qty 5

## 2021-11-26 MED ORDER — FENTANYL CITRATE (PF) 100 MCG/2ML IJ SOLN
INTRAMUSCULAR | Status: AC
  Filled 2021-11-26: qty 2

## 2021-11-26 MED ORDER — ACETAMINOPHEN 10 MG/ML IV SOLN
INTRAVENOUS | Status: DC | PRN
  Administered 2021-11-26: 1000 mg via INTRAVENOUS

## 2021-11-26 MED ORDER — ROCURONIUM BROMIDE 10 MG/ML (PF) SYRINGE
PREFILLED_SYRINGE | INTRAVENOUS | Status: AC
  Filled 2021-11-26: qty 30

## 2021-11-26 MED ORDER — PANTOPRAZOLE SODIUM 40 MG IV SOLR
40.0000 mg | Freq: Once | INTRAVENOUS | Status: AC
  Administered 2021-11-26: 40 mg via INTRAVENOUS
  Filled 2021-11-26: qty 10

## 2021-11-26 MED ORDER — ONDANSETRON HCL 4 MG/2ML IJ SOLN
INTRAMUSCULAR | Status: AC
  Filled 2021-11-26: qty 2

## 2021-11-26 MED ORDER — DEXAMETHASONE SODIUM PHOSPHATE 10 MG/ML IJ SOLN
INTRAMUSCULAR | Status: DC | PRN
  Administered 2021-11-26: 8 mg via INTRAVENOUS

## 2021-11-26 MED ORDER — PROPOFOL 10 MG/ML IV BOLUS
INTRAVENOUS | Status: DC | PRN
  Administered 2021-11-26: 150 mg via INTRAVENOUS

## 2021-11-26 MED ORDER — ACETAMINOPHEN 10 MG/ML IV SOLN
INTRAVENOUS | Status: AC
  Filled 2021-11-26: qty 100

## 2021-11-26 MED ORDER — PHENYLEPHRINE HCL-NACL 20-0.9 MG/250ML-% IV SOLN
INTRAVENOUS | Status: DC | PRN
  Administered 2021-11-26: 40 ug/min via INTRAVENOUS

## 2021-11-26 MED ORDER — ONDANSETRON HCL 4 MG/2ML IJ SOLN
INTRAMUSCULAR | Status: DC | PRN
  Administered 2021-11-26: 4 mg via INTRAVENOUS

## 2021-11-26 MED ORDER — FENTANYL CITRATE PF 50 MCG/ML IJ SOSY
50.0000 ug | PREFILLED_SYRINGE | Freq: Once | INTRAMUSCULAR | Status: AC | PRN
  Administered 2021-11-26: 50 ug via INTRAVENOUS
  Filled 2021-11-26: qty 1

## 2021-11-26 MED ORDER — PHENYLEPHRINE 80 MCG/ML (10ML) SYRINGE FOR IV PUSH (FOR BLOOD PRESSURE SUPPORT)
PREFILLED_SYRINGE | INTRAVENOUS | Status: AC
  Filled 2021-11-26: qty 10

## 2021-11-26 MED ORDER — MIDAZOLAM HCL 2 MG/2ML IJ SOLN
INTRAMUSCULAR | Status: AC
  Filled 2021-11-26: qty 2

## 2021-11-26 MED ORDER — EPHEDRINE 5 MG/ML INJ
INTRAVENOUS | Status: AC
  Filled 2021-11-26: qty 10

## 2021-11-26 MED ORDER — LIDOCAINE HCL (PF) 2 % IJ SOLN
INTRAMUSCULAR | Status: AC
  Filled 2021-11-26: qty 25

## 2021-11-26 MED ORDER — HYDROMORPHONE HCL 1 MG/ML IJ SOLN
INTRAMUSCULAR | Status: AC
  Filled 2021-11-26: qty 2

## 2021-11-26 MED ORDER — SUGAMMADEX SODIUM 200 MG/2ML IV SOLN
INTRAVENOUS | Status: DC | PRN
  Administered 2021-11-26: 200 mg via INTRAVENOUS

## 2021-11-26 MED ORDER — IOHEXOL 300 MG/ML  SOLN
100.0000 mL | Freq: Once | INTRAMUSCULAR | Status: AC | PRN
  Administered 2021-11-26: 100 mL via INTRAVENOUS

## 2021-11-26 MED ORDER — KETAMINE HCL 10 MG/ML IJ SOLN
INTRAMUSCULAR | Status: DC | PRN
  Administered 2021-11-26: 30 mg via INTRAVENOUS

## 2021-11-26 MED ORDER — PIPERACILLIN-TAZOBACTAM 3.375 G IVPB 30 MIN
3.3750 g | Freq: Once | INTRAVENOUS | Status: AC
  Administered 2021-11-26: 3.375 g via INTRAVENOUS
  Filled 2021-11-26: qty 50

## 2021-11-26 MED ORDER — SUCCINYLCHOLINE CHLORIDE 200 MG/10ML IV SOSY
PREFILLED_SYRINGE | INTRAVENOUS | Status: AC
  Filled 2021-11-26: qty 30

## 2021-11-26 SURGICAL SUPPLY — 48 items
APL SWBSTK 6 STRL LF DISP (MISCELLANEOUS) ×1
APPLICATOR COTTON TIP 6 STRL (MISCELLANEOUS) ×2 IMPLANT
APPLICATOR COTTON TIP 6IN STRL (MISCELLANEOUS) ×1 IMPLANT
BAG COUNTER SPONGE SURGICOUNT (BAG) IMPLANT
BAG SPNG CNTER NS LX DISP (BAG)
BLADE EXTENDED COATED 6.5IN (ELECTRODE) IMPLANT
BLADE HEX COATED 2.75 (ELECTRODE) ×2 IMPLANT
BNDG GAUZE DERMACEA FLUFF 4 (GAUZE/BANDAGES/DRESSINGS) IMPLANT
BNDG GZE DERMACEA 4 6PLY (GAUZE/BANDAGES/DRESSINGS) ×1
COVER MAYO STAND STRL (DRAPES) IMPLANT
COVER SURGICAL LIGHT HANDLE (MISCELLANEOUS) ×2 IMPLANT
DRAIN CHANNEL 19F RND (DRAIN) IMPLANT
DRAPE LAPAROSCOPIC ABDOMINAL (DRAPES) ×2 IMPLANT
DRAPE UTILITY XL STRL (DRAPES) ×2 IMPLANT
DRAPE WARM FLUID 44X44 (DRAPES) IMPLANT
ELECT REM PT RETURN 15FT ADLT (MISCELLANEOUS) ×2 IMPLANT
EVACUATOR SILICONE 100CC (DRAIN) IMPLANT
GAUZE PAD ABD 8X10 STRL (GAUZE/BANDAGES/DRESSINGS) IMPLANT
GAUZE SPONGE 4X4 12PLY STRL (GAUZE/BANDAGES/DRESSINGS) ×2 IMPLANT
GLOVE BIO SURGEON STRL SZ 6.5 (GLOVE) IMPLANT
GLOVE BIO SURGEON STRL SZ7 (GLOVE) ×2 IMPLANT
GLOVE BIO SURGEON STRL SZ8 (GLOVE) IMPLANT
GLOVE BIOGEL PI IND STRL 6.5 (GLOVE) IMPLANT
GLOVE BIOGEL PI IND STRL 7.0 (GLOVE) ×2 IMPLANT
GLOVE BIOGEL PI IND STRL 7.5 (GLOVE) ×2 IMPLANT
GLOVE BIOGEL PI IND STRL 8 (GLOVE) IMPLANT
GOWN STRL REUS W/ TWL LRG LVL3 (GOWN DISPOSABLE) ×4 IMPLANT
GOWN STRL REUS W/ TWL XL LVL3 (GOWN DISPOSABLE) IMPLANT
GOWN STRL REUS W/TWL LRG LVL3 (GOWN DISPOSABLE) ×2
GOWN STRL REUS W/TWL XL LVL3 (GOWN DISPOSABLE) ×4
HANDLE SUCTION POOLE (INSTRUMENTS) IMPLANT
KIT BASIN OR (CUSTOM PROCEDURE TRAY) ×2 IMPLANT
KIT TURNOVER KIT A (KITS) IMPLANT
PACK GENERAL/GYN (CUSTOM PROCEDURE TRAY) ×2 IMPLANT
STAPLER VISISTAT 35W (STAPLE) ×2 IMPLANT
SUCTION POOLE HANDLE (INSTRUMENTS)
SUT ETHILON 2 0 PS N (SUTURE) IMPLANT
SUT PDS AB 0 CTX 60 (SUTURE) IMPLANT
SUT SILK 2 0 SH CR/8 (SUTURE) IMPLANT
SUT SILK 3 0 (SUTURE)
SUT SILK 3 0 SH CR/8 (SUTURE) IMPLANT
SUT SILK 3-0 18XBRD TIE 12 (SUTURE) IMPLANT
TAPE CLOTH SURG 6X10 WHT LF (GAUZE/BANDAGES/DRESSINGS) IMPLANT
TOWEL OR 17X26 10 PK STRL BLUE (TOWEL DISPOSABLE) ×4 IMPLANT
TRAY FOLEY MTR SLVR 14FR STAT (SET/KITS/TRAYS/PACK) IMPLANT
TRAY FOLEY MTR SLVR 16FR STAT (SET/KITS/TRAYS/PACK) ×2 IMPLANT
WATER STERILE IRR 1000ML POUR (IV SOLUTION) IMPLANT
YANKAUER SUCT BULB TIP NO VENT (SUCTIONS) IMPLANT

## 2021-11-26 NOTE — ED Triage Notes (Signed)
Pt BIB [facility] for blood in stool, abdominal pain, and weakness. Recent abd surgery BP 100/60 HR 90 O2 98% ra

## 2021-11-26 NOTE — H&P (Signed)
Reason for Consult:Free air Referring Physician: [name] is an 41 y.o. female.  HPI: This is a 41 year old female who is status post gastric bypass by Dr. Heron Nay at [facility] in 2021.  She was recently hospitalized at [facility] and underwent emergent surgery by Dr. Dyann Kief on 09/01/2021.  For an apparent perforation at her gastrojejunal anastomosis.  He performed a diagnostic laparoscopy, washout, Graham patch of the GJ anastomosis, and placement of a G-tube in the gastric remnant.  She was last seen by Dr. Evorn Gong on 10/15/2021.  Her G-tube has been removed.  A follow-up upper GI showed no sign of leak.  The patient has resumed smoking.  She presents with a 2-week history of upper abdominal pain that is increasing.  She has had black tarry stools.  She denies NSAID use.  Her pain became acutely worse and she called EMS who picked her up on the side of the road from her vehicle.  She was brought to [facility] emergency department for evaluation.  She is hemodynamically stable.  White blood cell count is normal.  CT scan shows signs of ulceration at the anterior aspect of the GJ anastomosis with some associated foci of extraluminal gas with adjacent free air.  We are asked to see the patient.  Past Medical History:  Diagnosis Date   Allergic rhinitis    Anxiety    Arthritis    In her back and legs per pt, 09/03/2020   Bipolar disorder ([place])    Diagnosised 10 yrs ago doesn't take any meds 09/03/2020   Chronic thoracic back pain    GERD (gastroesophageal reflux disease)    History of gestational diabetes    Hyperlipidemia    Major depression    Menorrhagia    Migraine    Smokers' cough (HCC)    albuterol (PROVENTIL HFA;VENTOLIN HFA)   Wears glasses    09/03/2020    Past Surgical History:  Procedure Laterality Date   BIOPSY THYROID  06/06/2016   LEFT THYROIDECTOMY   CESAREAN SECTION  05/21/2004   CESAREAN SECTION WITH BILATERAL TUBAL  LIGATION Bilateral 08/29/2014   Procedure: CESAREAN SECTION WITH BILATERAL TUBAL LIGATION;  Surgeon: Cheri Fowler, MD;  Location: [facility];  Service: Obstetrics;  Laterality: Bilateral;  MD requests RNFA Tina H. RNFA confirmed 7/29...tms   CHOLECYSTECTOMY N/A 03/26/2015   Procedure: LAPAROSCOPIC CHOLECYSTECTOMY;  Surgeon: Arta Bruce Kinsinger, MD;  Location: [facility];  Service: General;  Laterality: N/A;   [place] N/A 09/05/2020   Procedure: DILATATION & CURETTAGE/HYSTEROSCOPY WITH MYOSURE;  Surgeon: Cheri Fowler, MD;  Location: [facility];  Service: Gynecology;  Laterality: N/A;   DILITATION & CURRETTAGE/HYSTROSCOPY WITH NOVASURE ABLATION N/A 01/28/2016   Procedure: DILATATION & CURETTAGE/HYSTEROSCOPY WITH NOVASURE ABLATION;  Surgeon: Cheri Fowler, MD;  Location: [facility];  Service: Gynecology;  Laterality: N/A;   GASTRIC BYPASS     done in 2021, 09/03/2020   LUMBAR MICRODISCECTOMY  11/17/2007   L5-  S1   THYROIDECTOMY Left 06/06/2016   Procedure: LEFT THYROIDECTOMY AND CORE BIOPSY OF RIGHT THYROID;  Surgeon: Melida Quitter, MD;  Location: [facility];  Service: ENT;  Laterality: Left;   TONSILLECTOMY  2001   TUBAL LIGATION      Family History  Problem Relation Age of Onset   Depression Mother    Mental illness Mother    Diabetes Father    Hypertension Father    Hyperlipidemia Father    Diabetes  Maternal Grandmother        Dialysis   Hypertension Maternal Grandmother    Heart disease Maternal Grandmother    Cancer Paternal Grandmother        Breast Cancer   Cancer Paternal Grandfather        colon   Colon cancer Paternal Grandfather    Diabetes Brother    Cancer Other        colon   Kidney failure Other        dialysis   Colon cancer Paternal Uncle    Esophageal cancer Neg Hx    Stomach cancer Neg Hx    Rectal cancer Neg Hx     Social History:  reports that she quit smoking about 5 years ago. Her  smoking use included cigarettes. She has a 10.00 pack-year smoking history. She has never used smokeless tobacco. She reports that she does not drink alcohol and does not use drugs.  Allergies:  Allergies  Allergen Reactions   Oxycodone Itching    SEVERE (percocet)   Tramadol Other (See Comments)    "Trigger's migraine's"   Wellbutrin [Bupropion] Hives    Medications:  Prior to Admission medications   Medication Sig Start Date End Date Taking? Authorizing Provider  acetaminophen (TYLENOL) 650 MG CR tablet Take 1,300 mg by mouth every 8 (eight) hours as needed for pain.    [provider]  dexlansoprazole (DEXILANT) 60 MG capsule Take 60 mg by mouth at bedtime.  Patient not taking: Reported on 10/02/2020    [provider]  NUCYNTA 50 MG tablet Take 50 mg by mouth daily. Patient not taking: Reported on 10/02/2020 09/09/16   [provider]  tizanidine (ZANAFLEX) 2 MG capsule Take 2 mg by mouth 2 (two) times daily as needed for muscle spasms. Patient not taking: Reported on 10/02/2020 09/09/16   [provider]     Results for orders placed or performed during the hospital encounter of 11/26/21 (from the past 48 hour(s))  Comprehensive metabolic panel     Status: Abnormal   Collection Time: 11/26/21  4:41 PM  Result Value Ref Range   Sodium 140 135 - 145 mmol/L   Potassium 2.8 (L) 3.5 - 5.1 mmol/L   Chloride 104 98 - 111 mmol/L   CO2 29 22 - 32 mmol/L   Glucose, Bld 123 (H) 70 - 99 mg/dL    Comment: Glucose reference range applies only to samples taken after fasting for at least 8 hours.   BUN 8 6 - 20 mg/dL   Creatinine, Ser 0.50 0.44 - 1.00 mg/dL   Calcium 8.2 (L) 8.9 - 10.3 mg/dL   Total Protein 6.0 (L) 6.5 - 8.1 g/dL   Albumin 3.1 (L) 3.5 - 5.0 g/dL   AST 20 15 - 41 U/L   ALT 16 0 - 44 U/L   Alkaline Phosphatase 70 38 - 126 U/L   Total Bilirubin 0.7 0.3 - 1.2 mg/dL   GFR, Estimated >60 >60 mL/min    Comment: (NOTE) Calculated using the  CKD-EPI Creatinine Equation (2021)    Anion gap 7 5 - 15    Comment: Performed at [facility], [place] [address] [place], [place] [address]  Lactic acid, plasma     Status: None   Collection Time: 11/26/21  4:41 PM  Result Value Ref Range   Lactic Acid, Venous 1.2 0.5 - 1.9 mmol/L    Comment: Performed at [facility], [place] [address] [place],  [place] [address]  Lipase, blood     Status: None   Collection Time: 11/26/21  4:41 PM  Result Value Ref Range   Lipase 26 11 - 51 U/L    Comment: Performed at [facility], [place] [address] [place], [place] [address]  CBC with Differential     Status: None   Collection Time: 11/26/21  4:41 PM  Result Value Ref Range   WBC 4.6 4.0 - 10.5 K/uL   RBC 5.02 3.87 - 5.11 MIL/uL   Hemoglobin 13.7 12.0 - 15.0 g/dL   HCT 43.2 36.0 - 46.0 %   MCV 86.1 80.0 - 100.0 fL   MCH 27.3 26.0 - 34.0 pg   MCHC 31.7 30.0 - 36.0 g/dL   RDW 12.9 11.5 - 15.5 %   Platelets 309 150 - 400 K/uL   nRBC 0.0 0.0 - 0.2 %   Neutrophils Relative % 78 %   Neutro Abs 3.6 1.7 - 7.7 K/uL   Lymphocytes Relative 18 %   Lymphs Abs 0.9 0.7 - 4.0 K/uL   Monocytes Relative 4 %   Monocytes Absolute 0.2 0.1 - 1.0 K/uL   Eosinophils Relative 0 %   Eosinophils Absolute 0.0 0.0 - 0.5 K/uL   Basophils Relative 0 %   Basophils Absolute 0.0 0.0 - 0.1 K/uL   Immature Granulocytes 0 %   Abs Immature Granulocytes 0.00 0.00 - 0.07 K/uL    Comment: Performed at [facility], [place] [address] [place], [place] [address]  Troponin I (High Sensitivity)     Status: None   Collection Time: 11/26/21  4:41 PM  Result Value Ref Range   Troponin I (High Sensitivity) 2 <18 ng/L    Comment: (NOTE) Elevated high sensitivity troponin I (hsTnI) values and significant  changes across serial measurements may suggest ACS but many other  chronic and acute conditions are known to elevate hsTnI results.  Refer to the "Links" section  for chest pain algorithms and additional  guidance. Performed at [facility], [place] [address] [place], [place] [address]   I-Stat beta hCG blood, ED     Status: None   Collection Time: 11/26/21  4:47 PM  Result Value Ref Range   I-stat hCG, quantitative <5.0 <5 mIU/mL   Comment 3            Comment:   GEST. AGE      CONC.  (mIU/mL)   <=1 WEEK        5 - 50     2 WEEKS       50 - 500     3 WEEKS       100 - 10,000     4 WEEKS     1,000 - 30,000        FEMALE AND NON-PREGNANT FEMALE:     LESS THAN 5 mIU/mL   Blood gas, venous     Status: Abnormal   Collection Time: 11/26/21  4:47 PM  Result Value Ref Range   pH, Ven 7.34 7.25 - 7.43   pCO2, Ven 54 44 - 60 mmHg   pO2, Ven 43 32 - 45 mmHg   Bicarbonate 29.1 (H) 20.0 - 28.0 mmol/L   Acid-Base Excess 2.2 (H) 0.0 - 2.0 mmol/L   O2 Saturation 73.7 %   Patient temperature 37.0     Comment: Performed at [facility], [place] [address] [place], [place] [address]  Type and screen [facility] [place]  [facility]     Status: None   Collection Time: 11/26/21  4:47 PM  Result Value Ref Range   ABO/RH(D) O POS    Antibody Screen NEG    Sample Expiration      11/29/2021,2359 Performed at [facility], [place] [address], [place], [place] [address]   Ginger Carne 8, ED     Status: Abnormal   Collection Time: 11/26/21  4:49 PM  Result Value Ref Range   Sodium 138 135 - 145 mmol/L   Potassium 2.8 (L) 3.5 - 5.1 mmol/L   Chloride 98 98 - 111 mmol/L   BUN 6 6 - 20 mg/dL   Creatinine, Ser 0.50 0.44 - 1.00 mg/dL   Glucose, Bld 118 (H) 70 - 99 mg/dL    Comment: Glucose reference range applies only to samples taken after fasting for at least 8 hours.   Calcium, Ion 1.11 (L) 1.15 - 1.40 mmol/L   TCO2 27 22 - 32 mmol/L   Hemoglobin 13.9 12.0 - 15.0 g/dL   HCT 41.0 36.0 - 46.0 %  Magnesium     Status: None   Collection Time: 11/26/21  4:53 PM  Result Value Ref Range   Magnesium 1.7 1.7 - 2.4  mg/dL    Comment: Performed at [facility], [place] [address] [place], [place] [address]    CT ABDOMEN PELVIS W CONTRAST  Result Date: 11/26/2021 CLINICAL DATA:  Abdominal pain, weakness, blood in stool, recent abdominal surgery EXAM: CT ABDOMEN AND PELVIS WITH CONTRAST TECHNIQUE: Multidetector CT imaging of the abdomen and pelvis was performed using the standard protocol following bolus administration of intravenous contrast. RADIATION DOSE REDUCTION: This exam was performed according to the departmental dose-optimization program which includes automated exposure control, adjustment of the mA and/or kV according to patient size and/or use of iterative reconstruction technique. CONTRAST:  18m OMNIPAQUE IOHEXOL 300 MG/ML  SOLN COMPARISON:  None Available. FINDINGS: Lower chest: Mild dependent atelectasis in the bilateral lower lobes. Hepatobiliary: Liver is notable for a 13 mm cyst adjacent to the falciform ligament. Status post cholecystectomy. No hepatic or extrahepatic duct dilatation. Pancreas: Within normal limits. Spleen: Within normal limits. Adrenals/Urinary Tract: Adrenal glands are within normal limits. Kidneys with normal limits.  No hydronephrosis. Bladder is notable for trace nondependent gas, correlate for recent intervention. Stomach/Bowel: Stomach is notable for postsurgical changes related to gastric bypass with small hiatal hernia. Ulceration/outpouching along the anterior aspect of the gastrojejunostomy anastomosis (series 2/image 31). Associated adjacent foci of extraluminal gas (series 2/image 32) with moderate free air, raising concern for disruption at the anastomosis. No evidence of bowel obstruction. Appendix is not discretely visualized. No colonic wall thickening or inflammatory changes. Vascular/Lymphatic: No evidence of abdominal aortic aneurysm. No suspicious abdominopelvic lymphadenopathy. Reproductive: Uterus is mildly heterogeneous, suggesting uterine  fibroids. Bilateral ovaries are within normal limits. Other: Small to moderate abdominopelvic ascites. Musculoskeletal: Degenerative changes of the lumbar spine. IMPRESSION: Ulceration/outpouching along the anterior aspect of the gastrojejunostomy anastomosis. Associated adjacent foci of extraluminal gas with moderate free air, raising concern for disruption at the anastomosis. Associated small to moderate abdominopelvic ascites. Critical Value/emergent results were called by telephone at the time of interpretation on 11/26/2021 at 7:21 pm to provider SSherwood Gambler, who verbally acknowledged these results. Electronically Signed   By: SJulian HyM.D.   On: 11/26/2021 19:26   DG Chest Portable 1 View  Result Date: 11/26/2021 CLINICAL DATA:  Abdominal pain, short of breath EXAM: PORTABLE CHEST 1  VIEW COMPARISON:  05/27/2016 FINDINGS: The heart size and mediastinal contours are within normal limits. Both lungs are clear. The visualized skeletal structures are unremarkable. IMPRESSION: No active disease. Electronically Signed   By: Franchot Gallo M.D.   On: 11/26/2021 17:14    Review of Systems  HENT:  Negative for ear discharge, ear pain, hearing loss and tinnitus.   Eyes:  Negative for photophobia and pain.  Respiratory:  Negative for cough and shortness of breath.   Cardiovascular:  Negative for chest pain.  Gastrointestinal:  Positive for abdominal pain, blood in stool and nausea. Negative for vomiting.  Genitourinary:  Negative for dysuria, flank pain, frequency and urgency.  Musculoskeletal:  Negative for back pain, myalgias and neck pain.  Neurological:  Negative for dizziness and headaches.  Hematological:  Does not bruise/bleed easily.  Psychiatric/Behavioral:  Positive for agitation. The patient is nervous/anxious.    Blood pressure 116/74, pulse (!) 105, temperature 98.2 F (36.8 C), temperature source Oral, resp. rate 17, height '5\' 7"'$  (1.702 m), weight 59 kg, SpO2 96 %. Physical  Exam Initially somnolent, but easily arousable and quickly became agitated Eyes:  Pupils equal, round; sclera anicteric; moist conjunctiva; no lid lag HENT:  Oral mucosa moist; good dentition  Neck:  No masses palpated, trachea midline; no thyromegaly Lungs:  CTA bilaterally; normal respiratory effort CV:  Tachycardic regular rhythm; no murmurs; extremities well-perfused with no edema Abd:  +bowel sounds, soft, tender diffusely but mostly in the epigastrium; healed laparoscopic and G-tube incisions Musc:  Unable to assess gait; no apparent clubbing or cyanosis in extremities Lymphatic:  No palpable cervical or axillary lymphadenopathy Skin:  Warm, dry; no sign of jaundice Psychiatric - alert and oriented x 4; calm mood and affect  Assessment/Plan: Free intraperitoneal air, probably from recurrent perforation at the G-J anastomosis S/p recent Graham patch Hypokalemia  Based on her examination and the CT findings, we recommend immediate exploratory laparotomy with probable repair/ Phillip Heal patch of perforated anastomotic ulcer.  She may need revision of her anastomosis.    The patient is quite agitated and insisted that Dr. Evorn Gong is the only person allowed to operate on her.  I explained to her that Dr. Evorn Gong works at [employer] and that delaying her surgery may result in sepsis and possibly death.  After further consideration, she has agreed to surgery here.  We we will proceed with emergent exploratory laparotomy, repair of the perforated ulcer, possible revision of the gastrojejunal anastomosis.  The surgical procedure has been discussed with the patient.  Potential risks, benefits, alternative treatments, and expected outcomes have been explained.  All of the patient's questions at this time have been answered.  The likelihood of reaching the patient's treatment goal is good.  The patient understand the proposed surgical procedure and wishes to proceed.   Imogene Burn  Tina [name] 11/26/2021, 8:30 PM

## 2021-11-26 NOTE — Anesthesia Procedure Notes (Signed)
Procedure Name: Intubation Date/Time: 11/26/2021 10:15 PM  Performed by: Milford Cage, CRNAPre-anesthesia Checklist: Patient identified, Emergency Drugs available, Suction available and Patient being monitored Patient Re-evaluated:Patient Re-evaluated prior to induction Oxygen Delivery Method: Circle system utilized Preoxygenation: Pre-oxygenation with 100% oxygen Induction Type: IV induction Ventilation: Mask ventilation without difficulty Laryngoscope Size: Miller and 2 Grade View: Grade I Tube type: Oral Tube size: 7.0 mm Number of attempts: 1 Airway Equipment and Method: Stylet Placement Confirmation: ETT inserted through vocal cords under direct vision, positive ETCO2 and breath sounds checked- equal and bilateral Tube secured with: Tape Dental Injury: Teeth and Oropharynx as per pre-operative assessment

## 2021-11-26 NOTE — Transfer of Care (Signed)
Immediate Anesthesia Transfer of Care Note  Patient: Tina [name]  Procedure(s) Performed: EXPLORATORY LAPAROTOMY, GRAHAM PATCH REPAIR OF PERFORATED ULCER  Patient Location: PACU  Anesthesia Type:General  Level of Consciousness: awake  Airway & Oxygen Therapy: Patient Spontanous Breathing and Patient connected to face mask oxygen  Post-op Assessment: Report given to RN and Post -op Vital signs reviewed and stable  Post vital signs: Reviewed and stable  Last Vitals:  Vitals Value Taken Time  BP 131/66 11/26/21 2315  Temp 37.1 C 11/26/21 2315  Pulse 79 11/26/21 2318  Resp 16 11/26/21 2318  SpO2 98 % 11/26/21 2318  Vitals shown include unvalidated device data.  Last Pain:  Vitals:   11/26/21 2200  TempSrc:   PainSc: 0-No pain         Complications: No notable events documented.

## 2021-11-26 NOTE — Op Note (Signed)
Preop diagnosis: Free intraperitoneal air Postop diagnosis: Perforated ulcer at gastrojejunal anastomosis Procedure performed: Exploratory laparotomy, Phillip Heal patch repair of perforated ulcer at gastrojejunal anastomosis Surgeon:  Maia Petties Assistant: Dr. Romana Juniper Anesthesia: General Indications: This is a 41 year old female who is status post gastric bypass by Dr. Heron Nay in [place] in 2021.  She was recently hospitalized in [place] and underwent emergent surgery by Dr. Forest Gleason on 09/01/2021.  He performed a diagnostic laparoscopy, washout, Graham patch of the gastrojejunal anastomosis.  He also placed the gastric tube in the gastric remnant.  According to the operative note he did not see a clear hole in the [place] anastomosis but patch the entire anastomosis.  The patient presents with a 2-week history of melena and increasing upper abdominal pain.  She has not been taking her PPI.  She has resumed smoking.  She had acute worsening of her pain today.  She called EMS who brought her to the [facility] emergency department for evaluation.  CT scan showed free air and signs of perforation near the gastrojejunal anastomosis.  Operative findings: 5 mm ulcer at the anterior surface of the gastrojejunal anastomosis.  This is just adjacent to the recent [facility] patch. CASE DATA:  Type of patient?:  Emergent Tina Sidle, DO W  Status of Case? EMERGENT Add On  Infection Present At Time Of Surgery (PATOS)?   Bilious and cloudy drainage throughout the entire peritoneal cavity   Description of procedure: The patient is brought to the operating room and placed in the supine position on the operating room table.  After an adequate level of general anesthesia was obtained, a Foley catheter was placed under sterile technique.  The patient's abdomen was prepped with ChloraPrep and draped sterile fashion.  A timeout was taken to ensure the proper patient and proper procedure.  We made a  vertical midline incision from the xiphoid down to just above the umbilicus.  We dissected down to the linea alba with cautery.  We divided the linea alba and entered the peritoneal cavity.  There is copious cloudy fluid immediately encountered.  We evacuated the cloudy fluid and opened incision widely.  The Balfour retractor was used to provide exposure.  We then explored the upper abdomen.  The liver appears grossly normal.  There is some fibrinous exudate and thickening of the gastric pouch.  A 5 mm ulcer is seen the anterior surface of the gastrojejunal anastomosis.  The posterior surface of the GJ anastomosis is intact.  We examined the remainder of the small bowel.  The JJ anastomosis is intact with no sign of perforation.  The remaining limbs of the bypass show no sign of torsion or obstruction.  The colon appears grossly normal.  The gastric stump is seen adherent to the anterior abdominal wall from the recent gastrostomy tube.  We then performed a Phillip Heal patch with 3 sutures of 3-0 silk using an adjacent tongue of omentum.  This reached easily to cover the ulcer.  The tails of the suture were used to tie down the Davis patch.  We then irrigated the abdomen thoroughly with multiple liters of saline until the irrigant was clear.  A 19 French drain was inserted through a stab incision in the right upper quadrant.  This was secured with 2-0 Ethilon.  The drain was placed adjacent to the [facility] patch and then placed underneath the liver.  We inspected for hemostasis.  A nasogastric tube was passed by anesthesia.  We palpated this within  the gastric pouch.  The fascia was reapproximated with double-stranded #1 PDS suture.  The subcutaneous tissues were inspected for hemostasis.  We packed the subcutaneous tissues with saline moistened Kerlix.  ABD dressing was applied.  The patient is then extubated and brought to the recovery room in stable condition.  All sponge, instrument, and needle counts are  correct.  Imogene Burn. Georgette Dover, MD, [facility] Surgery  General Surgery   11/26/2021 11:15 PM

## 2021-11-26 NOTE — Anesthesia Preprocedure Evaluation (Addendum)
Anesthesia Evaluation  Patient identified by MRN, date of birth, ID band Patient awake    Reviewed: Allergy & Precautions, NPO status , Patient's Chart, lab work & pertinent test results  History of Anesthesia Complications Negative for: history of anesthetic complications  Airway Mallampati: III  TM Distance: >3 FB Neck ROM: Full    Dental no notable dental hx.    Pulmonary neg shortness of breath, neg sleep apnea, neg COPD, neg recent URI, Patient abstained from smoking., former smoker   Pulmonary exam normal breath sounds clear to auscultation       Cardiovascular negative cardio ROS  Rhythm:Regular Rate:Normal     Neuro/Psych  Headaches PSYCHIATRIC DISORDERS Anxiety Depression Bipolar Disorder    Neuromuscular disease (chronic thoracic back pain)    GI/Hepatic Neg liver ROS,GERD  ,,S/p gastric bypass   Endo/Other  neg diabetes  S/p left thyroidectomy  Renal/GU negative Renal ROS     Musculoskeletal  (+) Arthritis ,    Abdominal   Peds  Hematology negative hematology ROS (+)   Anesthesia Other Findings 41 year old female who is status post gastric bypass by Dr. Heron Nay at [facility] in 2021.  She was recently hospitalized at [facility] and underwent emergent surgery by Dr. Dyann Kief on 09/01/2021.  For an apparent perforation at her gastrojejunal anastomosis.  He performed a diagnostic laparoscopy, washout, Graham patch of the GJ anastomosis, and placement of a G-tube in the gastric remnant.  She was last seen by Dr. Evorn Gong on 10/15/2021.  Her G-tube has been removed.  A follow-up upper GI showed no sign of leak.  The patient has resumed smoking.  She presents with a 2-week history of upper abdominal pain that is increasing.  She has had black tarry stools.  She denies NSAID use.  Her pain became acutely worse and she called EMS who picked her up on the side of the road from her vehicle.  She was  brought to [facility] emergency department for evaluation.  She is hemodynamically stable.  White blood cell count is normal.  CT scan shows signs of ulceration at the anterior aspect of the GJ anastomosis with some associated foci of extraluminal gas with adjacent free air.   K 2.8 in ED, replaced with 40 mEq PO potassium  Hyper-IgE syndrome  Reproductive/Obstetrics                             Anesthesia Physical Anesthesia Plan  ASA: 3 and emergent  Anesthesia Plan: General   Post-op Pain Management:    Induction: Intravenous and Rapid sequence  PONV Risk Score and Plan: 3 and Ondansetron, Dexamethasone and Midazolam  Airway Management Planned: Oral ETT  Additional Equipment:   Intra-op Plan:   Post-operative Plan: Extubation in OR  Informed Consent: I have reviewed the patients History and Physical, chart, labs and discussed the procedure including the risks, benefits and alternatives for the proposed anesthesia with the patient or authorized representative who has indicated his/her understanding and acceptance.     Dental advisory given  Plan Discussed with: CRNA and Anesthesiologist  Anesthesia Plan Comments: (Risks of general anesthesia discussed including, but not limited to, sore throat, hoarse voice, chipped/damaged teeth, injury to vocal cords, nausea and vomiting, allergic reactions, lung infection, heart attack, stroke, and death. All questions answered. )        Anesthesia Quick Evaluation

## 2021-11-26 NOTE — ED Provider Notes (Signed)
[place] DEPT Provider Note   CSN: [id number] Arrival date & time: 11/26/21  1603     History  Chief Complaint  Patient presents with   GI Problem    Tina [name] is a 41 y.o. female.  HPI 41 year old female with a history of a prior gastric bypass over 10 years ago as well as a recent Graham patch for perforated ulcer in August presents with abdominal pain and black stools.  She has been having black/tarry stools for about 2 weeks.  She is also been having abdominal pain during that time.  The abdominal pain is diffuse.  Today the pain is worse and she is also developed vomiting.  She denies any blood thinner use, aspirin use or NSAID use.  She does currently smoke.  Home Medications Prior to Admission medications   Medication Sig Start Date End Date Taking? Authorizing Provider  acetaminophen (TYLENOL) 650 MG CR tablet Take 1,300 mg by mouth every 8 (eight) hours as needed for pain.    [provider]  dexlansoprazole (DEXILANT) 60 MG capsule Take 60 mg by mouth at bedtime.  Patient not taking: Reported on 10/02/2020    [provider]  NUCYNTA 50 MG tablet Take 50 mg by mouth daily. Patient not taking: Reported on 10/02/2020 09/09/16   [provider]  tizanidine (ZANAFLEX) 2 MG capsule Take 2 mg by mouth 2 (two) times daily as needed for muscle spasms. Patient not taking: Reported on 10/02/2020 09/09/16   [provider]      Allergies    Oxycodone, Tramadol, and Wellbutrin [bupropion]    Review of Systems   Review of Systems  Constitutional:  Negative for fever.  Respiratory:  Positive for shortness of breath.   Cardiovascular:  Negative for chest pain.  Gastrointestinal:  Positive for abdominal pain, blood in stool and vomiting.    Physical Exam Updated Vital Signs BP 131/66 (BP Location: Right Arm)   Pulse 77   Temp 98.8 F (37.1 C)   Resp 16   Ht '5\' 7"'$  (1.702 m)   Wt 59 kg   SpO2 100%    BMI 20.36 kg/m  Physical Exam Vitals and nursing note reviewed.  Constitutional:      Appearance: She is well-developed. She is ill-appearing. She is not diaphoretic.  HENT:     Head: Normocephalic and atraumatic.     Mouth/Throat:     Mouth: Mucous membranes are dry.  Cardiovascular:     Rate and Rhythm: Regular rhythm. Tachycardia present.     Heart sounds: Normal heart sounds.     Comments: HR 90s-100 Pulmonary:     Effort: Pulmonary effort is normal.     Breath sounds: Normal breath sounds.  Abdominal:     General: There is no distension.     Palpations: Abdomen is soft.     Tenderness: There is abdominal tenderness.     Comments: Soft abd but diffuse, severe tenderness  Skin:    General: Skin is warm and dry.  Neurological:     Mental Status: She is alert.     Comments: Patient is awake but somnolent. Talks to me and answers questions appropriately but then will fall asleep if left alone     ED Results / Procedures / Treatments   Labs (all labs ordered are listed, but only abnormal results are displayed) Labs Reviewed  COMPREHENSIVE METABOLIC PANEL - Abnormal; Notable for the following components:      Result Value  Potassium 2.8 (*)    Glucose, Bld 123 (*)    Calcium 8.2 (*)    Total Protein 6.0 (*)    Albumin 3.1 (*)    All other components within normal limits  BLOOD GAS, VENOUS - Abnormal; Notable for the following components:   Bicarbonate 29.1 (*)    Acid-Base Excess 2.2 (*)    All other components within normal limits  I-STAT CHEM 8, ED - Abnormal; Notable for the following components:   Potassium 2.8 (*)    Glucose, Bld 118 (*)    Calcium, Ion 1.11 (*)    All other components within normal limits  LACTIC ACID, PLASMA  LIPASE, BLOOD  CBC WITH DIFFERENTIAL/PLATELET  MAGNESIUM  URINALYSIS, ROUTINE W REFLEX MICROSCOPIC  RAPID URINE DRUG SCREEN, HOSP PERFORMED  CBC  BASIC METABOLIC PANEL  MAGNESIUM  I-STAT BETA HCG BLOOD, ED ([facility], [facility], [facility] ONLY)   TYPE AND SCREEN  TROPONIN I (HIGH SENSITIVITY)    EKG EKG Interpretation  Date/Time:  Tuesday November 26 2021 17:07:26 EST Ventricular Rate:  91 PR Interval:  130 QRS Duration: 83 QT Interval:  371 QTC Calculation: 457 R Axis:   40 Text Interpretation: Sinus rhythm nonspecific ST changes diffusely new since 2004 Confirmed by Sherwood Gambler [phone]) on 11/26/2021 5:12:09 PM  Radiology CT ABDOMEN PELVIS W CONTRAST  Result Date: 11/26/2021 CLINICAL DATA:  Abdominal pain, weakness, blood in stool, recent abdominal surgery EXAM: CT ABDOMEN AND PELVIS WITH CONTRAST TECHNIQUE: Multidetector CT imaging of the abdomen and pelvis was performed using the standard protocol following bolus administration of intravenous contrast. RADIATION DOSE REDUCTION: This exam was performed according to the departmental dose-optimization program which includes automated exposure control, adjustment of the mA and/or kV according to patient size and/or use of iterative reconstruction technique. CONTRAST:  167m OMNIPAQUE IOHEXOL 300 MG/ML  SOLN COMPARISON:  None Available. FINDINGS: Lower chest: Mild dependent atelectasis in the bilateral lower lobes. Hepatobiliary: Liver is notable for a 13 mm cyst adjacent to the falciform ligament. Status post cholecystectomy. No hepatic or extrahepatic duct dilatation. Pancreas: Within normal limits. Spleen: Within normal limits. Adrenals/Urinary Tract: Adrenal glands are within normal limits. Kidneys with normal limits.  No hydronephrosis. Bladder is notable for trace nondependent gas, correlate for recent intervention. Stomach/Bowel: Stomach is notable for postsurgical changes related to gastric bypass with small hiatal hernia. Ulceration/outpouching along the anterior aspect of the gastrojejunostomy anastomosis (series 2/image 31). Associated adjacent foci of extraluminal gas (series 2/image 32) with moderate free air, raising concern for disruption at the anastomosis. No evidence  of bowel obstruction. Appendix is not discretely visualized. No colonic wall thickening or inflammatory changes. Vascular/Lymphatic: No evidence of abdominal aortic aneurysm. No suspicious abdominopelvic lymphadenopathy. Reproductive: Uterus is mildly heterogeneous, suggesting uterine fibroids. Bilateral ovaries are within normal limits. Other: Small to moderate abdominopelvic ascites. Musculoskeletal: Degenerative changes of the lumbar spine. IMPRESSION: Ulceration/outpouching along the anterior aspect of the gastrojejunostomy anastomosis. Associated adjacent foci of extraluminal gas with moderate free air, raising concern for disruption at the anastomosis. Associated small to moderate abdominopelvic ascites. Critical Value/emergent results were called by telephone at the time of interpretation on 11/26/2021 at 7:21 pm to provider SSherwood Gambler, who verbally acknowledged these results. Electronically Signed   By: SJulian HyM.D.   On: 11/26/2021 19:26   DG Chest Portable 1 View  Result Date: 11/26/2021 CLINICAL DATA:  Abdominal pain, short of breath EXAM: PORTABLE CHEST 1 VIEW COMPARISON:  05/27/2016 FINDINGS: The heart size and mediastinal contours are  within normal limits. Both lungs are clear. The visualized skeletal structures are unremarkable. IMPRESSION: No active disease. Electronically Signed   By: Franchot Gallo M.D.   On: 11/26/2021 17:14    Procedures Ultrasound ED Peripheral IV (Provider)  Date/Time: 11/26/2021 5:13 PM  Performed by: Sherwood Gambler, MD Authorized by: Sherwood Gambler, MD   Procedure details:    Indications: poor IV access     Skin Prep: chlorhexidine gluconate     Location:  Left AC   Angiocath:  20 G   Bedside Ultrasound Guided: Yes     Images: archived     Patient tolerated procedure without complications: Yes     Dressing applied: Yes   .Critical Care  Performed by: Sherwood Gambler, MD Authorized by: Sherwood Gambler, MD   Critical care provider  statement:    Critical care time (minutes):  40   Critical care time was exclusive of:  Separately billable procedures and treating other patients   Critical care was necessary to treat or prevent imminent or life-threatening deterioration of the following conditions:  Sepsis and shock   Critical care was time spent personally by me on the following activities:  Development of treatment plan with patient or surrogate, discussions with consultants, evaluation of patient's response to treatment, examination of patient, ordering and review of laboratory studies, ordering and review of radiographic studies, ordering and performing treatments and interventions, pulse oximetry, re-evaluation of patient's condition and review of old charts     Medications Ordered in ED Medications  promethazine (PHENERGAN) injection 6.25-12.5 mg (has no administration in time range)  HYDROmorphone (DILAUDID) injection 0.25-0.5 mg (has no administration in time range)  0.9 % irrigation (POUR BTL) (5,000 mLs Irrigation Given 11/26/21 2227)  lactated ringers bolus 1,000 mL (0 mLs Intravenous Stopped 11/26/21 1901)  fentaNYL (SUBLIMAZE) injection 50 mcg (50 mcg Intravenous Given 11/26/21 1759)  potassium chloride SA (KLOR-CON M) CR tablet 40 mEq (40 mEq Oral Given 11/26/21 2014)  iohexol (OMNIPAQUE) 300 MG/ML solution 100 mL (100 mLs Intravenous Contrast Given 11/26/21 1855)  piperacillin-tazobactam (ZOSYN) IVPB 3.375 g (0 g Intravenous Stopped 11/26/21 2044)  pantoprazole (PROTONIX) injection 40 mg (40 mg Intravenous Given 11/26/21 2014)  fentaNYL (SUBLIMAZE) injection 50 mcg (50 mcg Intravenous Given 11/26/21 2125)    ED Course/ Medical Decision Making/ A&P                           Medical Decision Making Amount and/or Complexity of Data Reviewed External Data Reviewed: notes.    Details: Recent hospital notes from August Labs: ordered.    Details: Hypokalemia without hypomagnesemia.  Normal hemoglobin and  WBC. Radiology: ordered and independent interpretation performed.    Details: Chest x-ray unremarkable but CT shows free fluid and free air consistent with perforation.  Discussed with radiology. ECG/medicine tests: ordered and independent interpretation performed.    Details: Nonspecific ST changes, mild.  Risk Prescription drug management. Decision regarding hospitalization.   Patient has diffuse tenderness but no rigidity or hemodynamic instability on arrival.  She was given IV fluids.  Eventually when she was more awake she was given IV fentanyl for pain.  CT images viewed/interpreted by myself and I discussed with radiology as above, has free air consistent with gastric perforation.  Discussed case with Dr. Gershon Crane, who will take patient to the OR.  She was given IV Protonix and IV Zosyn.  She was given IV fluids.        Final  Clinical Impression(s) / ED Diagnoses Final diagnoses:  Free intraperitoneal air  Gastric perforation [facility])    Rx / DC Orders ED Discharge Orders     None         Sherwood Gambler, MD 11/26/21 2334

## 2021-11-27 ENCOUNTER — Encounter (HOSPITAL_COMMUNITY): Payer: Self-pay | Admitting: Surgery

## 2021-11-27 LAB — CBC
HCT: 35.1 % — ABNORMAL LOW (ref 36.0–46.0)
Hemoglobin: 11.3 g/dL — ABNORMAL LOW (ref 12.0–15.0)
MCH: 27.3 pg (ref 26.0–34.0)
MCHC: 32.2 g/dL (ref 30.0–36.0)
MCV: 84.8 fL (ref 80.0–100.0)
Platelets: 232 10*3/uL (ref 150–400)
RBC: 4.14 MIL/uL (ref 3.87–5.11)
RDW: 13.1 % (ref 11.5–15.5)
WBC: 7.1 10*3/uL (ref 4.0–10.5)
nRBC: 0 % (ref 0.0–0.2)

## 2021-11-27 LAB — BASIC METABOLIC PANEL
Anion gap: 5 (ref 5–15)
BUN: 8 mg/dL (ref 6–20)
CO2: 25 mmol/L (ref 22–32)
Calcium: 7.5 mg/dL — ABNORMAL LOW (ref 8.9–10.3)
Chloride: 107 mmol/L (ref 98–111)
Creatinine, Ser: 0.46 mg/dL (ref 0.44–1.00)
GFR, Estimated: >60 mL/min (ref >60)
Glucose, Bld: 115 mg/dL — ABNORMAL HIGH (ref 70–99)
Potassium: 3.6 mmol/L (ref 3.5–5.1)
Sodium: 137 mmol/L (ref 135–145)

## 2021-11-27 LAB — MAGNESIUM: Magnesium: 1.6 mg/dL — ABNORMAL LOW (ref 1.7–2.4)

## 2021-11-27 MED ORDER — METOPROLOL TARTRATE 5 MG/5ML IV SOLN: 5.0000 mg | Freq: Four times a day (QID) | INTRAVENOUS | Status: DC | PRN

## 2021-11-27 MED ORDER — HYDRALAZINE HCL 20 MG/ML IJ SOLN: 10.0000 mg | INTRAMUSCULAR | Status: DC | PRN

## 2021-11-27 MED ORDER — METHOCARBAMOL 500 MG IVPB - SIMPLE MED: 500.0000 mg | Freq: Four times a day (QID) | INTRAVENOUS | Status: DC | PRN

## 2021-11-27 MED ORDER — SODIUM CHLORIDE 0.9% FLUSH: 9.0000 mL | INTRAVENOUS | Status: DC | PRN

## 2021-11-27 MED ORDER — PANTOPRAZOLE INFUSION (NEW) - SIMPLE MED
8.0000 mg/h | INTRAVENOUS | Status: AC
  Administered 2021-11-27 – 2021-11-29 (×8): 8 mg/h via INTRAVENOUS
  Filled 2021-11-27 (×2): qty 80
  Filled 2021-11-27: qty 100
  Filled 2021-11-27: qty 80
  Filled 2021-11-27 (×2): qty 100
  Filled 2021-11-27 (×4): qty 80

## 2021-11-27 MED ORDER — HEPARIN SODIUM (PORCINE) 5000 UNIT/ML IJ SOLN
INTRAMUSCULAR | Status: AC
  Filled 2021-11-27: qty 1

## 2021-11-27 MED ORDER — ONDANSETRON HCL 4 MG/2ML IJ SOLN
4.0000 mg | Freq: Four times a day (QID) | INTRAMUSCULAR | Status: DC | PRN
  Administered 2021-11-28 – 2021-11-30 (×3): 4 mg via INTRAVENOUS
  Filled 2021-11-27 (×3): qty 2

## 2021-11-27 MED ORDER — PIPERACILLIN-TAZOBACTAM 3.375 G IVPB
3.3750 g | Freq: Three times a day (TID) | INTRAVENOUS | Status: DC
  Administered 2021-11-27 – 2021-12-01 (×12): 3.375 g via INTRAVENOUS
  Filled 2021-11-27 (×12): qty 50

## 2021-11-27 MED ORDER — ACETAMINOPHEN 10 MG/ML IV SOLN
INTRAVENOUS | Status: AC
  Filled 2021-11-27: qty 100

## 2021-11-27 MED ORDER — PANTOPRAZOLE SODIUM 40 MG IV SOLR
40.0000 mg | Freq: Two times a day (BID) | INTRAVENOUS | Status: DC
  Administered 2021-11-30 (×2): 40 mg via INTRAVENOUS
  Filled 2021-11-27 (×3): qty 10

## 2021-11-27 MED ORDER — ACETAMINOPHEN 10 MG/ML IV SOLN
1000.0000 mg | Freq: Four times a day (QID) | INTRAVENOUS | Status: AC
  Administered 2021-11-27 (×3): 1000 mg via INTRAVENOUS
  Filled 2021-11-27 (×2): qty 100

## 2021-11-27 MED ORDER — MAGNESIUM SULFATE 2 GM/50ML IV SOLN
2.0000 g | Freq: Once | INTRAVENOUS | Status: AC
  Administered 2021-11-27: 2 g via INTRAVENOUS
  Filled 2021-11-27 (×2): qty 50

## 2021-11-27 MED ORDER — HYDROMORPHONE 1 MG/ML IV SOLN
INTRAVENOUS | Status: DC
  Administered 2021-11-27: 30 mg via INTRAVENOUS
  Administered 2021-11-27: 0.9 mg via INTRAVENOUS
  Administered 2021-11-28: 0.3 mg via INTRAVENOUS
  Administered 2021-11-28: 0.9 mg via INTRAVENOUS
  Administered 2021-11-28: 1.2 mg via INTRAVENOUS
  Administered 2021-11-28: 0.3 mg via INTRAVENOUS
  Administered 2021-11-28: 1.2 mg via INTRAVENOUS
  Administered 2021-11-29 (×2): 0.3 mg via INTRAVENOUS
  Administered 2021-11-29: 2.1 mg via INTRAVENOUS
  Administered 2021-11-29 – 2021-11-30 (×2): 0.3 mg via INTRAVENOUS
  Administered 2021-11-30: 0.6 mg via INTRAVENOUS
  Filled 2021-11-27 (×8): qty 30

## 2021-11-27 MED ORDER — PIPERACILLIN-TAZOBACTAM 3.375 G IVPB
INTRAVENOUS | Status: AC
  Administered 2021-11-27: 3.375 g via INTRAVENOUS
  Filled 2021-11-27: qty 50

## 2021-11-27 MED ORDER — DIPHENHYDRAMINE HCL 50 MG/ML IJ SOLN: 25.0000 mg | Freq: Four times a day (QID) | INTRAMUSCULAR | Status: DC | PRN

## 2021-11-27 MED ORDER — PANTOPRAZOLE 80MG IVPB - SIMPLE MED
80.0000 mg | Freq: Once | INTRAVENOUS | Status: AC
  Administered 2021-11-27: 80 mg via INTRAVENOUS
  Filled 2021-11-27: qty 80

## 2021-11-27 MED ORDER — SODIUM CHLORIDE 0.9 % IV SOLN
12.5000 mg | Freq: Four times a day (QID) | INTRAVENOUS | Status: DC | PRN
  Administered 2021-11-29: 12.5 mg via INTRAVENOUS
  Filled 2021-11-27: qty 12.5

## 2021-11-27 MED ORDER — SODIUM CHLORIDE 0.9 % IV SOLN: INTRAVENOUS | Status: DC

## 2021-11-27 MED ORDER — HEPARIN SODIUM (PORCINE) 5000 UNIT/ML IJ SOLN
5000.0000 [IU] | Freq: Three times a day (TID) | INTRAMUSCULAR | Status: DC
  Administered 2021-11-27 – 2021-12-01 (×14): 5000 [IU] via SUBCUTANEOUS
  Filled 2021-11-27 (×14): qty 1

## 2021-11-27 MED ORDER — LORAZEPAM 2 MG/ML IJ SOLN
INTRAMUSCULAR | Status: AC
  Filled 2021-11-27: qty 1

## 2021-11-27 MED ORDER — NALOXONE HCL 0.4 MG/ML IJ SOLN: 0.4000 mg | INTRAMUSCULAR | Status: DC | PRN

## 2021-11-27 MED ORDER — LORAZEPAM 2 MG/ML IJ SOLN
0.5000 mg | Freq: Four times a day (QID) | INTRAMUSCULAR | Status: DC | PRN
  Administered 2021-11-27: 0.5 mg via INTRAVENOUS

## 2021-11-27 NOTE — Anesthesia Postprocedure Evaluation (Signed)
Anesthesia Post Note  Patient: Loss adjuster, chartered  Procedure(s) Performed: EXPLORATORY LAPAROTOMY, GRAHAM PATCH REPAIR OF PERFORATED ULCER     Patient location during evaluation: PACU Anesthesia Type: General Level of consciousness: awake Pain management: pain level controlled Vital Signs Assessment: post-procedure vital signs reviewed and stable Respiratory status: spontaneous breathing, nonlabored ventilation and respiratory function stable Cardiovascular status: blood pressure returned to baseline and stable Postop Assessment: no apparent nausea or vomiting Anesthetic complications: no   No notable events documented.  Last Vitals:  Vitals:   11/26/21 2200 11/26/21 2315  BP: 121/78 131/66  Pulse: 88 77  Resp: 18 16  Temp: 36.8 C 37.1 C  SpO2: 100% 100%    Last Pain:  Vitals:   11/26/21 2315  TempSrc:   PainSc: Asleep                 Nilda Simmer

## 2021-11-27 NOTE — Progress Notes (Signed)
[facility] Progress Note  1 Day Post-Op  Subjective: CC-  Seen in PACU. Out of surgery just prior to midnight. Pain well controlled with PCA. Denies n/v. No flatus or BM.   Objective: Vital signs in last 24 hours: Temp:  [97.6 F (36.4 C)-98.8 F (37.1 C)] 97.9 F (36.6 C) (11/15 0700) Pulse Rate:  [63-112] 65 (11/15 0700) Resp:  [11-26] 12 (11/15 0700) BP: (102-131)/(58-88) 104/66 (11/15 0700) SpO2:  [95 %-100 %] 100 % (11/15 0700) FiO2 (%):  [21 %] 21 % (11/15 0755) Weight:  [59 kg] 59 kg (11/14 1613)    Intake/Output from previous day: 11/14 0701 - 11/15 0700 In: 3050 [I.V.:1600; NG/GT:100; IV Piggyback:1350] Out: 750 [Urine:550; Drains:150; Blood:50] Intake/Output this shift: No intake/output data recorded.  PE: Gen:  Alert but drowsy, NAD Card:  RRR Pulm:  rate and effort normal with  Abd: Soft, ND, appropriately tender, cdi dressing to midline incision, hypoactive BS, drain serosanguinous GU: foley with clear yellow urine  Lab Results:  Recent Labs    11/26/21 1641 11/26/21 1649 11/27/21 0129  WBC 4.6  --  7.1  HGB 13.7 13.9 11.3*  HCT 43.2 41.0 35.1*  PLT 309  --  232   BMET Recent Labs    11/26/21 1641 11/26/21 1649 11/27/21 0129  NA 140 138 137  K 2.8* 2.8* 3.6  CL 104 98 107  CO2 29  --  25  GLUCOSE 123* 118* 115*  BUN '8 6 8  '$ CREATININE 0.50 0.50 0.46  CALCIUM 8.2*  --  7.5*   PT/INR No results for input(s): "LABPROT", "INR" in the last 72 hours. CMP     Component Value Date/Time   NA 137 11/27/2021 0129   K 3.6 11/27/2021 0129   CL 107 11/27/2021 0129   CO2 25 11/27/2021 0129   GLUCOSE 115 (H) 11/27/2021 0129   BUN 8 11/27/2021 0129   CREATININE 0.46 11/27/2021 0129   CALCIUM 7.5 (L) 11/27/2021 0129   PROT 6.0 (L) 11/26/2021 1641   ALBUMIN 3.1 (L) 11/26/2021 1641   AST 20 11/26/2021 1641   ALT 16 11/26/2021 1641   ALKPHOS 70 11/26/2021 1641   BILITOT 0.7 11/26/2021 1641   GFRNONAA >60 11/27/2021 0129   GFRAA  >60 05/27/2016 0821   Lipase     Component Value Date/Time   LIPASE 26 11/26/2021 1641       Studies/Results: CT ABDOMEN PELVIS W CONTRAST  Result Date: 11/26/2021 CLINICAL DATA:  Abdominal pain, weakness, blood in stool, recent abdominal surgery EXAM: CT ABDOMEN AND PELVIS WITH CONTRAST TECHNIQUE: Multidetector CT imaging of the abdomen and pelvis was performed using the standard protocol following bolus administration of intravenous contrast. RADIATION DOSE REDUCTION: This exam was performed according to the departmental dose-optimization program which includes automated exposure control, adjustment of the mA and/or kV according to patient size and/or use of iterative reconstruction technique. CONTRAST:  18m OMNIPAQUE IOHEXOL 300 MG/ML  SOLN COMPARISON:  None Available. FINDINGS: Lower chest: Mild dependent atelectasis in the bilateral lower lobes. Hepatobiliary: Liver is notable for a 13 mm cyst adjacent to the falciform ligament. Status post cholecystectomy. No hepatic or extrahepatic duct dilatation. Pancreas: Within normal limits. Spleen: Within normal limits. Adrenals/Urinary Tract: Adrenal glands are within normal limits. Kidneys with normal limits.  No hydronephrosis. Bladder is notable for trace nondependent gas, correlate for recent intervention. Stomach/Bowel: Stomach is notable for postsurgical changes related to gastric bypass with small hiatal hernia. Ulceration/outpouching along the anterior aspect of the  gastrojejunostomy anastomosis (series 2/image 31). Associated adjacent foci of extraluminal gas (series 2/image 32) with moderate free air, raising concern for disruption at the anastomosis. No evidence of bowel obstruction. Appendix is not discretely visualized. No colonic wall thickening or inflammatory changes. Vascular/Lymphatic: No evidence of abdominal aortic aneurysm. No suspicious abdominopelvic lymphadenopathy. Reproductive: Uterus is mildly heterogeneous, suggesting  uterine fibroids. Bilateral ovaries are within normal limits. Other: Small to moderate abdominopelvic ascites. Musculoskeletal: Degenerative changes of the lumbar spine. IMPRESSION: Ulceration/outpouching along the anterior aspect of the gastrojejunostomy anastomosis. Associated adjacent foci of extraluminal gas with moderate free air, raising concern for disruption at the anastomosis. Associated small to moderate abdominopelvic ascites. Critical Value/emergent results were called by telephone at the time of interpretation on 11/26/2021 at 7:21 pm to provider Sherwood Gambler , who verbally acknowledged these results. Electronically Signed   By: Julian Hy M.D.   On: 11/26/2021 19:26   DG Chest Portable 1 View  Result Date: 11/26/2021 CLINICAL DATA:  Abdominal pain, short of breath EXAM: PORTABLE CHEST 1 VIEW COMPARISON:  05/27/2016 FINDINGS: The heart size and mediastinal contours are within normal limits. Both lungs are clear. The visualized skeletal structures are unremarkable. IMPRESSION: No active disease. Electronically Signed   By: Franchot Gallo M.D.   On: 11/26/2021 17:14    Anti-infectives: Anti-infectives (From admission, onward)    Start     Dose/Rate Route Frequency Ordered Stop   11/27/21 0400  piperacillin-tazobactam (ZOSYN) IVPB 3.375 g        3.375 g 12.5 mL/hr over 240 Minutes Intravenous Every 8 hours 11/27/21 0114     11/26/21 1930  piperacillin-tazobactam (ZOSYN) IVPB 3.375 g        3.375 g 100 mL/hr over 30 Minutes Intravenous  Once 11/26/21 1925 11/26/21 2044        Assessment/Plan Perforated ulcer at gastrojejunal anastomosis  -POD#1 s/p Exploratory laparotomy, Phillip Heal patch repair of perforated ulcer at gastrojejunal anastomosis  11/14 Dr. Georgette Dover - continue NPO/NGT to LIWS. Plan for UGI POD#5 (11/19) - JP serosanguinous - monitor - continue zosyn at least 5 days postop - protonix drip x72hr then BID PPI. Needs PPI and carafate upon discharge - BID wet to dry  dressing changes to midline abdominal wound - PCA, IV tylenol for pain - replace Mag  ID - zosyn 11/14>> FEN - IVF, NPO/NGT to LIWS VTE - sq heparin Foley - continue for now, plan to d/c tomorrow 11/16 Follow up - Dr. Evorn Gong  -Hx gastric bypass 2021 by Dr. Heron Nay at [place] diagnostic laparoscopy, washout, Phillip Heal patch of the [place] anastomosis, and placement of a G-tube in the gastric remnant 09/01/2021 in [place] by Dr. Forest Gleason for perforation at [place] anastomosis -Tobacco abuse -Anxiety -Bipolar disorder -HLD -Chronic back pain    LOS: 1 day    Wellington [place], [facility] 11/27/2021, 8:51 AM Please see Amion for pager number during day hours 7:00am-4:30pm

## 2021-11-27 NOTE — Progress Notes (Signed)
Pharmacy Antibiotic Note  Mikayla Chiusano is a 41 y.o. female admitted on 11/26/2021 with  2-week history of melena and increasing upper abdominal pain. Pt is status post gastric bypass 2021 and recently underwent emergent surgery with a diagnostic laparoscopy, washout, Graham patch of the gastrojejunal anastomosis.   Pharmacy has been consulted for zosyn dosing.  Plan: Zosyn 3.375g IV Q8H infused over 4hrs. Pharmacy will sign off and follow peripherally  Height: '5\' 7"'$  (170.2 cm) Weight: 59 kg (130 lb) IBW/kg (Calculated) : 61.6  Temp (24hrs), Avg:98.2 F (36.8 C), Min:97.6 F (36.4 C), Max:98.8 F (37.1 C)  Recent Labs  Lab 11/26/21 1641 11/26/21 1649 11/27/21 0129  WBC 4.6  --  7.1  CREATININE 0.50 0.50 0.46  LATICACIDVEN 1.2  --   --     Estimated Creatinine Clearance: 84.5 mL/min (by C-G formula based on SCr of 0.46 mg/dL).    Allergies  Allergen Reactions   Oxycodone Itching    SEVERE (percocet)   Tramadol Other (See Comments)    "Trigger's migraine's"   Wellbutrin [Bupropion] Hives     Thank you for allowing pharmacy to be a part of this patient's care. Dolly Rias RPh 11/27/2021, 2:47 AM

## 2021-11-28 ENCOUNTER — Other Ambulatory Visit: Payer: Self-pay

## 2021-11-28 LAB — BASIC METABOLIC PANEL
Anion gap: 6 (ref 5–15)
BUN: 14 mg/dL (ref 6–20)
CO2: 23 mmol/L (ref 22–32)
Calcium: 7.4 mg/dL — ABNORMAL LOW (ref 8.9–10.3)
Chloride: 109 mmol/L (ref 98–111)
Creatinine, Ser: 0.47 mg/dL (ref 0.44–1.00)
GFR, Estimated: >60 mL/min (ref >60)
Glucose, Bld: 76 mg/dL (ref 70–99)
Potassium: 3.6 mmol/L (ref 3.5–5.1)
Sodium: 138 mmol/L (ref 135–145)

## 2021-11-28 LAB — CBC
HCT: 31.9 % — ABNORMAL LOW (ref 36.0–46.0)
Hemoglobin: 10.1 g/dL — ABNORMAL LOW (ref 12.0–15.0)
MCH: 27.1 pg (ref 26.0–34.0)
MCHC: 31.7 g/dL (ref 30.0–36.0)
MCV: 85.5 fL (ref 80.0–100.0)
Platelets: 206 10*3/uL (ref 150–400)
RBC: 3.73 MIL/uL — ABNORMAL LOW (ref 3.87–5.11)
RDW: 13.3 % (ref 11.5–15.5)
WBC: 6 10*3/uL (ref 4.0–10.5)
nRBC: 0 % (ref 0.0–0.2)

## 2021-11-28 LAB — MAGNESIUM: Magnesium: 2 mg/dL (ref 1.7–2.4)

## 2021-11-28 MED ORDER — CHLORHEXIDINE GLUCONATE CLOTH 2 % EX PADS
6.0000 | MEDICATED_PAD | Freq: Every day | CUTANEOUS | Status: DC
  Administered 2021-11-28 – 2021-12-01 (×4): 6 via TOPICAL

## 2021-11-28 MED ORDER — ACETAMINOPHEN 10 MG/ML IV SOLN
1000.0000 mg | Freq: Four times a day (QID) | INTRAVENOUS | Status: AC
  Administered 2021-11-28 – 2021-11-29 (×4): 1000 mg via INTRAVENOUS
  Filled 2021-11-28 (×4): qty 100

## 2021-11-28 MED ORDER — PHENOL 1.4 % MT LIQD
1.0000 | OROMUCOSAL | Status: DC | PRN
  Administered 2021-11-28: 1 via OROMUCOSAL
  Filled 2021-11-28: qty 177

## 2021-11-28 MED ORDER — METHOCARBAMOL 1000 MG/10ML IJ SOLN
500.0000 mg | Freq: Four times a day (QID) | INTRAVENOUS | Status: DC
  Administered 2021-11-28 – 2021-12-01 (×14): 500 mg via INTRAVENOUS
  Filled 2021-11-28 (×2): qty 500
  Filled 2021-11-28 (×2): qty 5
  Filled 2021-11-28 (×3): qty 500
  Filled 2021-11-28 (×2): qty 5
  Filled 2021-11-28 (×8): qty 500

## 2021-11-28 NOTE — Progress Notes (Signed)
[facility] Progress Note  2 Days Post-Op  Subjective: CC-  Pain well controlled at rest. Tried to get OOB over night but was only able to stand at edge of bed. Denies n/v. No flatus or BM.  Objective: Vital signs in last 24 hours: Temp:  [97.8 F (36.6 C)-98.6 F (37 C)] 98 F (36.7 C) (11/16 0518) Pulse Rate:  [57-85] 85 (11/16 0518) Resp:  [11-18] 12 (11/16 0832) BP: (103-131)/(63-76) 131/70 (11/16 0518) SpO2:  [98 %-100 %] 100 % (11/16 0832) FiO2 (%):  [0 %-21 %] 0 % (11/16 0832)    Intake/Output from previous day: 11/15 0701 - 11/16 0700 In: 2278.6 [P.O.:30; I.V.:1806.6; IV Piggyback:442] Out: 1070 [Urine:810; Emesis/NG output:110; Drains:150] Intake/Output this shift: No intake/output data recorded.  PE: Gen:  Alert, NAD Card:  RRR Pulm:  rate and effort normal with Perry Abd: Soft, ND, appropriately tender over incision, open midline incision with healthy granulation tissue and no erythema or drainage. hypoactive BS, drain serosanguinous GU: foley with clear yellow urine  Lab Results:  Recent Labs    11/27/21 0129 11/28/21 0426  WBC 7.1 6.0  HGB 11.3* 10.1*  HCT 35.1* 31.9*  PLT 232 206   BMET Recent Labs    11/27/21 0129 11/28/21 0426  NA 137 138  K 3.6 3.6  CL 107 109  CO2 25 23  GLUCOSE 115* 76  BUN 8 14  CREATININE 0.46 0.47  CALCIUM 7.5* 7.4*   PT/INR No results for input(s): "LABPROT", "INR" in the last 72 hours. CMP     Component Value Date/Time   NA 138 11/28/2021 0426   K 3.6 11/28/2021 0426   CL 109 11/28/2021 0426   CO2 23 11/28/2021 0426   GLUCOSE 76 11/28/2021 0426   BUN 14 11/28/2021 0426   CREATININE 0.47 11/28/2021 0426   CALCIUM 7.4 (L) 11/28/2021 0426   PROT 6.0 (L) 11/26/2021 1641   ALBUMIN 3.1 (L) 11/26/2021 1641   AST 20 11/26/2021 1641   ALT 16 11/26/2021 1641   ALKPHOS 70 11/26/2021 1641   BILITOT 0.7 11/26/2021 1641   GFRNONAA >60 11/28/2021 0426   GFRAA >60 05/27/2016 0821   Lipase      Component Value Date/Time   LIPASE 26 11/26/2021 1641       Studies/Results: CT ABDOMEN PELVIS W CONTRAST  Result Date: 11/26/2021 CLINICAL DATA:  Abdominal pain, weakness, blood in stool, recent abdominal surgery EXAM: CT ABDOMEN AND PELVIS WITH CONTRAST TECHNIQUE: Multidetector CT imaging of the abdomen and pelvis was performed using the standard protocol following bolus administration of intravenous contrast. RADIATION DOSE REDUCTION: This exam was performed according to the departmental dose-optimization program which includes automated exposure control, adjustment of the mA and/or kV according to patient size and/or use of iterative reconstruction technique. CONTRAST:  145m OMNIPAQUE IOHEXOL 300 MG/ML  SOLN COMPARISON:  None Available. FINDINGS: Lower chest: Mild dependent atelectasis in the bilateral lower lobes. Hepatobiliary: Liver is notable for a 13 mm cyst adjacent to the falciform ligament. Status post cholecystectomy. No hepatic or extrahepatic duct dilatation. Pancreas: Within normal limits. Spleen: Within normal limits. Adrenals/Urinary Tract: Adrenal glands are within normal limits. Kidneys with normal limits.  No hydronephrosis. Bladder is notable for trace nondependent gas, correlate for recent intervention. Stomach/Bowel: Stomach is notable for postsurgical changes related to gastric bypass with small hiatal hernia. Ulceration/outpouching along the anterior aspect of the gastrojejunostomy anastomosis (series 2/image 31). Associated adjacent foci of extraluminal gas (series 2/image 32) with moderate free air, raising  concern for disruption at the anastomosis. No evidence of bowel obstruction. Appendix is not discretely visualized. No colonic wall thickening or inflammatory changes. Vascular/Lymphatic: No evidence of abdominal aortic aneurysm. No suspicious abdominopelvic lymphadenopathy. Reproductive: Uterus is mildly heterogeneous, suggesting uterine fibroids. Bilateral ovaries are  within normal limits. Other: Small to moderate abdominopelvic ascites. Musculoskeletal: Degenerative changes of the lumbar spine. IMPRESSION: Ulceration/outpouching along the anterior aspect of the gastrojejunostomy anastomosis. Associated adjacent foci of extraluminal gas with moderate free air, raising concern for disruption at the anastomosis. Associated small to moderate abdominopelvic ascites. Critical Value/emergent results were called by telephone at the time of interpretation on 11/26/2021 at 7:21 pm to provider Sherwood Gambler , who verbally acknowledged these results. Electronically Signed   By: Julian Hy M.D.   On: 11/26/2021 19:26   DG Chest Portable 1 View  Result Date: 11/26/2021 CLINICAL DATA:  Abdominal pain, short of breath EXAM: PORTABLE CHEST 1 VIEW COMPARISON:  05/27/2016 FINDINGS: The heart size and mediastinal contours are within normal limits. Both lungs are clear. The visualized skeletal structures are unremarkable. IMPRESSION: No active disease. Electronically Signed   By: Franchot Gallo M.D.   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 Georgette Dover - continue NPO/NGT to LIWS. Plan for UGI POD#5 (11/19) - JP serosanguinous - monitor - continue zosyn at least 5 days postop - protonix drip x72hr then BID PPI. Needs PPI and carafate upon discharge - BID wet to dry dressing changes to midline abdominal  wound - PCA, IV tylenol for pain. Add scheduled IV robaxin   ID - zosyn 11/14>> FEN - IVF, NPO/NGT to LIWS VTE - sq heparin Foley - d/c 11/16 Follow up - Dr. 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 Forest Gleason for perforation at [place] anastomosis -Tobacco abuse -Alcohol abuse -Anxiety -Bipolar disorder -HLD -Chronic back pain     LOS: 2 days    Wellington Hampshire, [facility] 11/28/2021, 9:53 AM Please see Amion for pager number during day hours 7:00am-4:30pm

## 2021-11-29 LAB — BASIC METABOLIC PANEL
Anion gap: 6 (ref 5–15)
BUN: 14 mg/dL (ref 6–20)
CO2: 23 mmol/L (ref 22–32)
Calcium: 7.7 mg/dL — ABNORMAL LOW (ref 8.9–10.3)
Chloride: 109 mmol/L (ref 98–111)
Creatinine, Ser: 0.52 mg/dL (ref 0.44–1.00)
GFR, Estimated: >60 mL/min (ref >60)
Glucose, Bld: 69 mg/dL — ABNORMAL LOW (ref 70–99)
Potassium: 3.5 mmol/L (ref 3.5–5.1)
Sodium: 138 mmol/L (ref 135–145)

## 2021-11-29 LAB — CBC
HCT: 33.1 % — ABNORMAL LOW (ref 36.0–46.0)
Hemoglobin: 10.3 g/dL — ABNORMAL LOW (ref 12.0–15.0)
MCH: 27.2 pg (ref 26.0–34.0)
MCHC: 31.1 g/dL (ref 30.0–36.0)
MCV: 87.3 fL (ref 80.0–100.0)
Platelets: 204 10*3/uL (ref 150–400)
RBC: 3.79 MIL/uL — ABNORMAL LOW (ref 3.87–5.11)
RDW: 13.1 % (ref 11.5–15.5)
WBC: 6.3 10*3/uL (ref 4.0–10.5)
nRBC: 0 % (ref 0.0–0.2)

## 2021-11-29 MED ORDER — ACETAMINOPHEN 10 MG/ML IV SOLN
1000.0000 mg | Freq: Four times a day (QID) | INTRAVENOUS | Status: AC
  Administered 2021-11-29 – 2021-11-30 (×4): 1000 mg via INTRAVENOUS
  Filled 2021-11-29 (×4): qty 100

## 2021-11-29 NOTE — Progress Notes (Signed)
Initial Nutrition Assessment  DOCUMENTATION CODES:      INTERVENTION:   -Will monitor for plan  Once diet advanced, recommend: -Ensure MAX Protein po BID, each supplement provides 150 kcal and 30 grams of protein  -Multivitamin with minerals BID -Tums TID -500 mg Vitamin C -history of Vitamin C deficiency in February 2023  -Recommended pt resume vitamin regimen: daily MVI, Calcium, Vitamin B-12 (if labs are low)  -Checking Vitamin D, Folate, and Vitamin B-12 labs given history of gastric bypass and noncompliance with daily vitamins  NUTRITION DIAGNOSIS:   Moderate Malnutrition related to chronic illness (history of gastric bypass in 2021, other surgeries since) as evidenced by moderate fat depletion, severe muscle depletion.  GOAL:   Patient will meet greater than or equal to 90% of their needs  MONITOR:   PO intake, Supplement acceptance  REASON FOR ASSESSMENT:   Malnutrition Screening Tool    ASSESSMENT:   41 year old female who is status post gastric bypass by Dr. Heron Nay in [place] in 2021.  She was recently hospitalized in [place] and underwent emergent surgery by Dr. Forest Gleason on 09/01/2021.  He performed a diagnostic laparoscopy, washout, Graham patch of the gastrojejunal anastomosis.  He also placed the gastric tube in the gastric remnant.The patient presents with a 2-week history of melena and increasing upper abdominal pain.  11/14: s/p ex lap, Graham patch repair of perforated ulcer at gastrojejunal anastomosis  Patient in room, NGT in place set to suction. Pt is NPO. Pt visibly uncomfortable. Pt reports she was eating normally, 3 meals a day, whatever she wants prior to 11/12. That was the last day she ate something. Pt with history of gastric bypass surgery in 2021. Pt states she has not been taking her vitamins, "didn't think they were doing anything". She was agreeable to restarting them when able. Pt is planned for UGI on 11/19. Will be NPO  until then. Made recommendations above if diet is advanced over weekend. Pt at risk of vitamin deficiencies, history of Vitamin C and D deficiencies this year. Will recheck Vitamin D, folate and B-12 levels.  Pt denies any hair loss, vision changes, bleeding gums, skin changes or wound healing issues.  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 Reports this was removed and was never used for feeding. Was placed as a precaution.  Per weight records, pt has lost 7 lbs since 10/3 (5% wt loss x 1.5 months, insignificant for time frame).   Medications: Phenergan, Zofran  Labs reviewed.   NUTRITION - FOCUSED PHYSICAL EXAM:  Partial d/t pain Flowsheet Row Most Recent Value  Orbital Region Mild depletion  Upper Arm Region Unable to assess  Thoracic and Lumbar Region Unable to assess  Buccal Region Moderate depletion  Temple Region Moderate depletion  Clavicle Bone Region Severe depletion  Clavicle and Acromion Bone Region Severe depletion  Scapular Bone Region Moderate depletion  Dorsal Hand Unable to assess  Patellar Region Unable to assess  Anterior Thigh Region Unable to assess  Posterior Calf Region Unable to assess  Edema (RD Assessment) None  Hair Reviewed  Eyes Reviewed  Mouth Reviewed  Skin Reviewed  Nails Reviewed       Diet Order:   Diet Order             Diet NPO time specified Except for: Ice Chips  Diet effective now                   EDUCATION NEEDS:   Education  needs have been addressed  Skin:  Skin Assessment: Skin Integrity Issues: Skin Integrity Issues:: Incisions Incisions: 11/14 abdomen  Last BM:  11/14  Height:   Ht Readings from Last 1 Encounters:  11/26/21 '5\' 7"'$  (1.702 m)    Weight:   Wt Readings from Last 1 Encounters:  11/26/21 59 kg    BMI:  Body mass index is 20.36 kg/m.  Estimated Nutritional Needs:   Kcal:  1700-1900  Protein:  85-100g  Fluid:  >1.9L/day  Clayton Bibles, MS, RD, LDN Inpatient Clinical  Dietitian Contact information available via Amion

## 2021-11-29 NOTE — Plan of Care (Signed)
  Problem: Coping: Goal: Level of anxiety will decrease Outcome: Progressing   Problem: Pain Managment: Goal: General experience of comfort will improve Outcome: Progressing   

## 2021-11-29 NOTE — TOC Progression Note (Signed)
Transition of Care [facility]) - Progression Note    Patient Details  Name: Tina [name] MRN: [id number] Date of Birth: 1980-01-18  Transition of Care [facility]) CM/SW Contact  Servando Snare,  Phone Number: 11/29/2021, 10:53 AM  Clinical Narrative:      Transition of Care (TOC) Screening Note   Patient Details  Name: Tina [name] Date of Birth: [birth year]   Transition of Care [facility]) CM/SW Contact:    Servando Snare, LCSW Phone Number: 11/29/2021, 10:54 AM  TOC consulted for SA counseling/ resources. Per chart review no substance use/abuse identified.   Transition of Care Department [facility]) has reviewed patient and no TOC needs have been identified at this time. We will continue to monitor patient advancement through interdisciplinary progression rounds. If new patient transition needs arise, please place a TOC consult.         Expected Discharge Plan and Services                                                 Social Determinants of Health (SDOH) Interventions    Readmission Risk Interventions     No data to display

## 2021-11-29 NOTE — Progress Notes (Signed)
3 Days Post-Op   Subjective/Chief Complaint: No flatus or bm yet Pain controlled   Objective: Vital signs in last 24 hours: Temp:  [98 F (36.7 C)-98.8 F (37.1 C)] 98.2 F (36.8 C) (11/17 0827) Pulse Rate:  [48-67] 48 (11/17 0827) Resp:  [12-17] 16 (11/17 0827) BP: (109-123)/(68-75) 115/71 (11/17 0827) SpO2:  [94 %-100 %] 99 % (11/17 0827) FiO2 (%):  [0 %] 0 % (11/16 2353) Last BM Date : 11/26/21  Intake/Output from previous day: 11/16 0701 - 11/17 0700 In: 2665.8 [P.O.:30; I.V.:1844.5; IV Piggyback:791.3] Out: 920 [Urine:725; Drains:195] Intake/Output this shift: Total I/O In: -  Out: 10 [Drains:10]  Exam: Awake and alert Looks comfortable Abdomen soft, open midline wound clean Drain serosanguinous   Lab Results:  Recent Labs    11/28/21 0426 11/29/21 0442  WBC 6.0 6.3  HGB 10.1* 10.3*  HCT 31.9* 33.1*  PLT 206 204   BMET Recent Labs    11/28/21 0426 11/29/21 0442  NA 138 138  K 3.6 3.5  CL 109 109  CO2 23 23  GLUCOSE 76 69*  BUN 14 14  CREATININE 0.47 0.52  CALCIUM 7.4* 7.7*   PT/INR No results for input(s): "LABPROT", "INR" in the last 72 hours. ABG Recent Labs    11/26/21 1647  HCO3 29.1*    Studies/Results: No results found.  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 Georgette Dover  - continue NPO/NGT to LIWS. Plan for UGI POD#5 (11/19) -continue wound care -ambulate -continue IV  antibiotics    -Hx gastric bypass 2021 by Dr. Heron Nay at [facility].  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 Forest Gleason for perforation at [place] anastomosis -Tobacco abuse -Alcohol abuse -Anxiety -Bipolar disorder -HLD -Chronic back pain     Coralie Keens MD 11/29/2021

## 2021-11-29 NOTE — Discharge Instructions (Addendum)
Wet to Dry WOUND CARE: - Change dressing twice daily - Supplies: sterile saline, kerlex, scissors, ABD pads, tape  Remove dressing and all packing carefully, moistening with sterile saline as needed to avoid packing/internal dressing sticking to the wound. 2.   Clean edges of skin around the wound with water/gauze, making sure there is no tape debris or leakage left on skin that could cause skin irritation or breakdown. 3.   Dampen and clean kerlex with sterile saline and pack wound from wound base to skin level, making sure to take note of any possible areas of wound tracking, tunneling and packing appropriately. Wound can be packed loosely. Trim kerlex to size if a whole kerlex is not required. 4.   Cover wound with a dry ABD pad and secure with tape.  5.   Write the date/time on the dry dressing/tape to better track when the last dressing change occurred. - apply any skin protectant/powder if recommended by clinician to protect skin/skin folds. - change dressing as needed if leakage occurs, wound gets contaminated, or patient requests to shower. - You may shower daily with wound open and following the shower the wound should be dried and a clean dressing placed.  - Medical grade tape as well as packing supplies can be found at [employer] on [address] or [employer] on [address]. The remaining supplies can be found at your local drug store, [employer] etc.  [facility], [place] [phone]  OPEN ABDOMINAL SURGERY: POST OP INSTRUCTIONS  Always review your discharge instruction sheet given to you by the facility where your surgery was performed.  IF YOU HAVE DISABILITY OR FAMILY LEAVE FORMS, YOU MUST BRING THEM TO THE OFFICE FOR PROCESSING.  PLEASE DO NOT GIVE THEM TO YOUR DOCTOR.  A prescription for pain medication may be given to you upon discharge.  Take your pain medication as prescribed, if needed.  If narcotic pain medicine is not needed,  then you may take acetaminophen (Tylenol) or ibuprofen (Advil) as needed. Take your usually prescribed medications unless otherwise directed. If you need a refill on your pain medication, please contact your pharmacy. They will contact our office to request authorization.  Prescriptions will not be filled after 5pm or on week-ends. You should follow a light diet the first few days after arrival home, such as soup and crackers, pudding, etc.unless your doctor has advised otherwise. A high-fiber, low fat diet can be resumed as tolerated.   Be sure to include lots of fluids daily. Most patients will experience some swelling and bruising on the chest and neck area.  Ice packs will help.  Swelling and bruising can take several days to resolve Most patients will experience some swelling and bruising in the area of the incision. Ice pack will help. Swelling and bruising can take several days to resolve..  It is common to experience some constipation if taking pain medication after surgery.  Increasing fluid intake and taking a stool softener will usually help or prevent this problem from occurring.  A mild laxative (Milk of Magnesia or Miralax) should be taken according to package directions if there are no bowel movements after 48 hours.  You may have steri-strips (small skin tapes) in place directly over the incision.  These strips should be left on the skin for 7-10 days.  If your surgeon used skin glue on the incision, you may shower in 24 hours.  The glue will flake off over the  next 2-3 weeks.  Any sutures or staples will be removed at the office during your follow-up visit. You may find that a light gauze bandage over your incision may keep your staples from being rubbed or pulled. You may shower and replace the bandage daily. ACTIVITIES:  You may resume regular (light) daily activities beginning the next day--such as daily self-care, walking, climbing stairs--gradually increasing activities as tolerated.   You may have sexual intercourse when it is comfortable.  Refrain from any heavy lifting or straining until approved by your doctor. You may drive when you no longer are taking prescription pain medication, you can comfortably wear a seatbelt, and you can safely maneuver your car and apply brakes Return to Work: ___________________________________ Dennis Bast should see your doctor in the office for a follow-up appointment approximately two weeks after your surgery.  Make sure that you call for this appointment within a day or two after you arrive home to insure a convenient appointment time. OTHER INSTRUCTIONS:  _____________________________________________________________ _____________________________________________________________  WHEN TO CALL YOUR DOCTOR: Fever over 101.0 Inability to urinate Nausea and/or vomiting Extreme swelling or bruising Continued bleeding from incision. Increased pain, redness, or drainage from the incision. Difficulty swallowing or breathing Muscle cramping or spasms. Numbness or tingling in hands or feet or around lips.  The clinic staff is available to answer your questions during regular business hours.  Please don't hesitate to call and ask to speak to one of the nurses if you have concerns.  For further questions, please visit [email or web address]   Vitamin and Mineral Supplements Bariatric Surgery Specific Brands  Bariatric Advantage Essential Multi Without Iron hard chewable  Celebrate Multivitamin  Opurity: No iron multivitamin   [facility]: Bariatric Multivitamin iron free capsule or chewable  Calcium supplements: 500 mg calcium carbonate or citrate, 3 times daily

## 2021-11-30 ENCOUNTER — Inpatient Hospital Stay (HOSPITAL_COMMUNITY): Payer: Medicaid Other

## 2021-11-30 LAB — FOLATE: Folate: 7.3 ng/mL (ref >5.9)

## 2021-11-30 LAB — VITAMIN D 25 HYDROXY (VIT D DEFICIENCY, FRACTURES): Vit D, 25-Hydroxy: 20.41 ng/mL — ABNORMAL LOW (ref 30–100)

## 2021-11-30 LAB — VITAMIN B12: Vitamin B-12: 969 pg/mL — ABNORMAL HIGH (ref 180–914)

## 2021-11-30 MED ORDER — IOHEXOL 300 MG/ML  SOLN
60.0000 mL | Freq: Once | INTRAMUSCULAR | Status: AC | PRN
  Administered 2021-11-30: 60 mL

## 2021-11-30 MED ORDER — HYDROMORPHONE HCL 1 MG/ML IJ SOLN
1.0000 mg | INTRAMUSCULAR | Status: DC | PRN
  Administered 2021-11-30 – 2021-12-01 (×6): 1 mg via INTRAVENOUS
  Filled 2021-11-30 (×6): qty 1

## 2021-11-30 NOTE — Progress Notes (Signed)
4 Days Post-Op   Subjective/Chief Complaint: Patient more comfortable Flatus, no BM yet UGI pending today Still using PCA   Objective: Vital signs in last 24 hours: Temp:  [98.1 F (36.7 C)-98.6 F (37 C)] 98.1 F (36.7 C) (11/18 0808) Pulse Rate:  [50-62] 60 (11/18 0808) Resp:  [13-18] 18 (11/18 0832) BP: (111-122)/(69-76) 122/71 (11/18 0808) SpO2:  [96 %-100 %] 96 % (11/18 0832) Last BM Date : 11/26/21  Intake/Output from previous day: 11/17 0701 - 11/18 0700 In: 2432.2 [I.V.:1747.9; IV Piggyback:684.2] Out: 1975 [BSWHQ:7591; Emesis/NG output:100; Drains:225] Intake/Output this shift: No intake/output data recorded.  Awake and alert Looks comfortable Abdomen soft, open midline wound clean Drain serosanguinous   Lab Results:  Recent Labs    11/28/21 0426 11/29/21 0442  WBC 6.0 6.3  HGB 10.1* 10.3*  HCT 31.9* 33.1*  PLT 206 204   BMET Recent Labs    11/28/21 0426 11/29/21 0442  NA 138 138  K 3.6 3.5  CL 109 109  CO2 23 23  GLUCOSE 76 69*  BUN 14 14  CREATININE 0.47 0.52  CALCIUM 7.4* 7.7*   PT/INR No results for input(s): "LABPROT", "INR" in the last 72 hours. ABG No results for input(s): "PHART", "HCO3" in the last 72 hours.  Invalid input(s): "PCO2", "PO2"  Studies/Results: No results found.  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 Georgette Dover   - continue NPO/NGT to LIWS. Plan for UGI today.  If  negative, will D/C ng tube and start clear liquids -continue wound care -ambulate -continue IV antibiotics   -Hx gastric bypass 2021 by Dr. Heron Nay at [facility].  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 Dyann Kief for perforation at [place] anastomosis -Tobacco abuse -Alcohol abuse -Anxiety -Bipolar disorder -HLD -Chronic back pain     LOS: 4 days    Tina [name] 11/30/2021

## 2021-11-30 NOTE — Progress Notes (Signed)
Hydromorphone iv PCA stopped as per ordered. Waste 16.6 ml with witness Administrator, sports.

## 2021-11-30 NOTE — Progress Notes (Signed)
Patient is s/p Phillip Heal patch repair of perforated ulcer at gastrojejunal anastomosis  11/14 Dr. Georgette Dover, has pending water soluble UGI ordered for tomorrow.   Spoke with Dr. Georgette Dover, [place] to perform UGI today.  Radiology team notified, will proceed with UGI today.    Armando Gang Margaretta Chittum PA-C 11/30/2021 9:32 AM

## 2021-12-01 ENCOUNTER — Other Ambulatory Visit (HOSPITAL_COMMUNITY): Payer: Medicaid Other

## 2021-12-01 MED ORDER — PANTOPRAZOLE SODIUM 40 MG PO TBEC
40.0000 mg | DELAYED_RELEASE_TABLET | Freq: Two times a day (BID) | ORAL | Status: DC
  Administered 2021-12-01: 40 mg via ORAL
  Filled 2021-12-01 (×2): qty 1

## 2021-12-01 MED ORDER — ACETAMINOPHEN 500 MG PO TABS
1000.0000 mg | ORAL_TABLET | Freq: Three times a day (TID) | ORAL | Status: DC
  Filled 2021-12-01: qty 2

## 2021-12-01 MED ORDER — ENSURE MAX PROTEIN PO LIQD
11.0000 [oz_av] | Freq: Every day | ORAL | Status: DC
  Administered 2021-12-01: 11 [oz_av] via ORAL

## 2021-12-01 MED ORDER — OXYCODONE HCL 5 MG PO TABS
5.0000 mg | ORAL_TABLET | ORAL | Status: DC | PRN
  Administered 2021-12-01: 5 mg via ORAL
  Filled 2021-12-01: qty 1

## 2021-12-01 NOTE — TOC Progression Note (Addendum)
Transition of Care [facility]) - Progression Note    Patient Details  Name: Tina [name] MRN: [id number] Date of Birth: [birth year]  Transition of Care [facility]) CM/SW Contact  Kimber Relic, LCSW Phone Number: 12/01/2021, 12:04 PM  Clinical Narrative:    TOC consulted to set up with [facility] RN to dress wounds. This CSW contacted [facility], [facility], and [facility] who are unable to assist. This CSW also contacted [facility], [facility], and [facility] but received no response yet. Please follow up with them on 11/20. Orders are in.        Expected Discharge Plan and Services                                                 Social Determinants of Health (SDOH) Interventions    Readmission Risk Interventions     No data to display

## 2021-12-01 NOTE — Progress Notes (Signed)
5 Days Post-Op   Subjective/Chief Complaint: Upper GI was negative for leak NG Removed Tolerating clear liquids Pain controlled Patient states that she has taken Oxycodone IR before without any problems - some itching with Percocet.  Had a BM this morning - not recorded  Objective: Vital signs in last 24 hours: Temp:  [97.6 F (36.4 C)-99 F (37.2 C)] 97.6 F (36.4 C) (11/19 0422) Pulse Rate:  [56-79] 56 (11/19 0422) Resp:  [16-18] 16 (11/19 0422) BP: (124-136)/(72-89) 134/83 (11/19 0422) SpO2:  [99 %-100 %] 100 % (11/19 0422) Last BM Date : 11/26/21  Intake/Output from previous day: 11/18 0701 - 11/19 0700 In: 2575.2 [P.O.:445; I.V.:1740.6; IV Piggyback:389.6] Out: 710 [Urine:300; Drains:410] Intake/Output this shift: Total I/O In: -  Out: 30 [Drains:30]  Awake and alert Looks comfortable Abdomen soft, open midline wound clean; moistened gauze is on the surrounding intact skin Drain serosanguinous   Lab Results:  Recent Labs    11/29/21 0442  WBC 6.3  HGB 10.3*  HCT 33.1*  PLT 204   BMET Recent Labs    11/29/21 0442  NA 138  K 3.5  CL 109  CO2 23  GLUCOSE 69*  BUN 14  CREATININE 0.52  CALCIUM 7.7*   PT/INR No results for input(s): "LABPROT", "INR" in the last 72 hours. ABG No results for input(s): "PHART", "HCO3" in the last 72 hours.  Invalid input(s): "PCO2", "PO2"  Studies/Results: DG UGI W SINGLE CM (SOL OR THIN BA)  Result Date: 11/30/2021 CLINICAL DATA:  41 year old female status post gastric bypass in 2021, history of perforation at the [place] anastomosis status post surgery on 09/01/2021 who presented with severe abdominal pain, found to have perforation at the [place] anastomosis, status post Phillip Heal patch repair on 11/26/2021. Request for water-soluble UGI to rule out postsurgical leak. EXAM: DG UGI W SINGLE CM TECHNIQUE: Scout radiograph was obtained. Single contrast examination was performed using 60 mL Omnipaque 300, contrast was administered  via NG tube. This exam was performed by Durenda Guthrie, PA-C, and was supervised and interpreted by Dorise Bullion, MD. FLUOROSCOPY: Radiation Exposure Index (as provided by the fluoroscopic device): 9.6 mGy Kerma COMPARISON:  None Available. FINDINGS: Scout Radiograph: Nonspecific gas pattern. Surgical staples in left upper quadrant consistent with history of gastric bypass. NG tube in appropriate position. Surgical drain terminating at the epigastric region. Stomach: Stomach with postsurgical changes. Administered contrast flowed freely from the stomach to the jejunum. No postsurgical leak noted at the G anastomosis. IMPRESSION: No postsurgical leak. Electronically Signed   By: Dorise Bullion III M.D.   On: 11/30/2021 10:32    Anti-infectives: Anti-infectives (From admission, onward)    Start     Dose/Rate Route Frequency Ordered Stop   11/27/21 0400  piperacillin-tazobactam (ZOSYN) IVPB 3.375 g  Status:  Discontinued        3.375 g 12.5 mL/hr over 240 Minutes Intravenous Every 8 hours 11/27/21 0114 12/01/21 0906   11/26/21 1930  piperacillin-tazobactam (ZOSYN) IVPB 3.375 g        3.375 g 100 mL/hr over 30 Minutes Intravenous  Once 11/26/21 1925 11/26/21 2044       Assessment/Plan: Perforated ulcer at gastrojejunal anastomosis  -POD#5 s/p Exploratory laparotomy, Phillip Heal patch repair of perforated ulcer at gastrojejunal anastomosis  11/14 Dr. Georgette Dover   -advance to bariatric full liquids - advance as tolerated to bariatric advanced diet -continue wound care - smaller dressings to protect surrounding skin -ambulate -continue IV antibiotics   -Hx gastric bypass 2021 by Dr.  Jeneen Rinks [place] at [facility].  Follow-up with [facility] per patient request -Recent diagnostic laparoscopy, washout, Graham patch of the GJ anastomosis, and placement of a G-tube in the gastric remnant 09/01/2021 in [place] by Dr. Dyann Kief for perforation at [place] anastomosis -Tobacco abuse -Alcohol  abuse -Anxiety -Bipolar disorder -HLD -Chronic back pain   LOS: 5 days    Tina [name] 12/01/2021

## 2021-12-02 NOTE — Progress Notes (Signed)
MBR signed AMA paperwork due to her stating that she wasn't staying. Her father needed her at home. He has some health issues and she has a "kid" at home that didn't have anywhere to go. Notified on call dr. And told him of the above issue. He stated to inform the patient to follow up with Dr. Evorn Gong and to take a reflux medication twice a day. Informed the pt. Of this. She stated that she already had plans to follow up with Dr. Evorn Gong. Pt. Continues to have JP drain to abdomen. She declined to take her scheduled medications, before discharge. She is able to walk without difficulties. She walked off the unit and got on the elevators without difficulty. She was leaving with her husband, whom she stated was downstairs.---Julieta Gutting BSN RN [facility]

## 2021-12-11 NOTE — Discharge Summary (Signed)
[facility] Discharge Summary   Patient ID: Tina [name] MRN: [id number] DOB/AGE: January 21, 1980 40 y.o.  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   Postlaminectomy syndrome, lumbar region 06/26/2011   Lumbago 05/09/2011   DJD (degenerative joint disease), lumbar 05/08/2011   Seasonal and perennial allergic rhinitis 05/08/2011   Sacroiliac dysfunction 04/07/2011   Lumbosacral spondylosis without myelopathy 03/10/2011   Dizziness and giddiness 11/02/2010   Preventative health care 04/26/2010   SMOKER 03/29/2010   HYPERSOMNIA 02/08/2010   HYPERLIPIDEMIA 04/26/2009   ADD 04/26/2009   Pruritus ani 04/26/2009   ANXIETY 02/01/2009   DEPRESSION 02/01/2009   COMMON MIGRAINE 02/01/2009   GERD 02/01/2009   CHOLELITHIASIS 02/01/2009    Consultants None   Imaging: No results found.  Procedures Dr.Matthew Tsuei  Exploratory laparotomy, Phillip Heal patch repair of perforated ulcer at gastrojejunal anastomosis  11/26/21 Dr. Georgette Dover   HPI: This is a 41 year old female who is status post gastric bypass by Dr. Heron Nay at [facility] in 2021. She was recently hospitalized at [facility] and underwent emergent surgery by Dr. Dyann Kief on 09/01/2021. For an apparent perforation at her gastrojejunal anastomosis. He performed a diagnostic laparoscopy, washout, Graham patch of the GJ anastomosis, and placement of a G-tube in the gastric remnant. She was last seen by Dr. Evorn Gong on 10/15/2021. Her G-tube has been removed. A follow-up upper GI showed no sign of leak. The patient has resumed smoking. She presents with a 2-week history of upper  abdominal pain that is increasing. She has had black tarry stools. She denies NSAID use. Her pain became acutely worse and she called EMS who picked her up on the side of the road from her vehicle. She was brought to [facility] emergency department for evaluation.   Hospital Course:  The patient was hemodynamically stable in the ED. White blood cell count is normal. CT scan shows signs of ulceration at the anterior aspect of the GJ anastomosis with some associated foci of extraluminal gas with adjacent free air. She was taken emergently to the OR for the above operation. She was extubated and admitted to the hospital in stable condition with an NG tube in place to [facility], on IV antibiotics, and on IV Protonix. On POD#4 UGI was performed and was negative for a leak. NG was removed and the patient was started on a clear liquid diet, then advanced to full liquids. On 12/01/21 the patient left AMA.   I was not personally involved in this patients care, therefore the above information was obtained entirely from chart review.      Allergies as of 12/01/2021       Reactions   Oxycodone Itching, Other (See Comments)   SEVERE itching with Percocet   Tramadol Other (See Comments)   "Triggers migraines"   Wellbutrin [bupropion] Hives        Medication List     ASK your doctor about these medications    acetaminophen 650 MG CR tablet Commonly known as: TYLENOL Take 1,300 mg by mouth every 8 (eight) hours as needed for pain.          Follow-up [facility], [place]. Call.   Specialty:  General Surgery Why: As needed Contact information: [address] [place], [place], [place]. Call.   Specialty: Surgery Why: Call to arrange follow up with your surgeon, he was notified that you were admitted Contact information: [place]  [address] [phone]                 Signed: Obie Dredge, [facility] 12/11/2021, 3:40 PM

## 2022-04-02 DIAGNOSIS — Z9884 Bariatric surgery status: Secondary | ICD-10-CM

## 2022-04-02 HISTORY — DX: Bariatric surgery status: Z98.84

## 2022-05-26 ENCOUNTER — Emergency Department (HOSPITAL_COMMUNITY): Payer: Medicaid Other

## 2022-05-26 ENCOUNTER — Inpatient Hospital Stay (HOSPITAL_COMMUNITY): Payer: Medicaid Other

## 2022-05-26 ENCOUNTER — Other Ambulatory Visit: Payer: Self-pay

## 2022-05-26 ENCOUNTER — Observation Stay (HOSPITAL_COMMUNITY)
Admission: EM | Admit: 2022-05-26 | Discharge: 2022-05-26 | Discharge status: Against Medical Advice | Payer: Medicaid Other | Attending: General Surgery | Admitting: General Surgery

## 2022-05-26 DIAGNOSIS — Z83438 Family history of other disorder of lipoprotein metabolism and other lipidemia: Secondary | ICD-10-CM

## 2022-05-26 DIAGNOSIS — D7822 Postprocedural hemorrhage and hematoma of the spleen following other procedure: Secondary | ICD-10-CM | POA: Diagnosis not present

## 2022-05-26 DIAGNOSIS — K219 Gastro-esophageal reflux disease without esophagitis: Secondary | ICD-10-CM | POA: Diagnosis present

## 2022-05-26 DIAGNOSIS — Z833 Family history of diabetes mellitus: Secondary | ICD-10-CM

## 2022-05-26 DIAGNOSIS — Z8249 Family history of ischemic heart disease and other diseases of the circulatory system: Secondary | ICD-10-CM

## 2022-05-26 DIAGNOSIS — Z888 Allergy status to other drugs, medicaments and biological substances status: Secondary | ICD-10-CM

## 2022-05-26 DIAGNOSIS — E890 Postprocedural hypothyroidism: Secondary | ICD-10-CM | POA: Diagnosis present

## 2022-05-26 DIAGNOSIS — D649 Anemia, unspecified: Secondary | ICD-10-CM | POA: Insufficient documentation

## 2022-05-26 DIAGNOSIS — D735 Infarction of spleen: Principal | ICD-10-CM

## 2022-05-26 DIAGNOSIS — Z9884 Bariatric surgery status: Secondary | ICD-10-CM

## 2022-05-26 DIAGNOSIS — F17210 Nicotine dependence, cigarettes, uncomplicated: Secondary | ICD-10-CM | POA: Diagnosis present

## 2022-05-26 DIAGNOSIS — D62 Acute posthemorrhagic anemia: Secondary | ICD-10-CM

## 2022-05-26 DIAGNOSIS — Z818 Family history of other mental and behavioral disorders: Secondary | ICD-10-CM

## 2022-05-26 DIAGNOSIS — S3609XA Other injury of spleen, initial encounter: Secondary | ICD-10-CM | POA: Diagnosis present

## 2022-05-26 DIAGNOSIS — R1084 Generalized abdominal pain: Secondary | ICD-10-CM | POA: Diagnosis present

## 2022-05-26 DIAGNOSIS — Z841 Family history of disorders of kidney and ureter: Secondary | ICD-10-CM

## 2022-05-26 DIAGNOSIS — Z800 Family history of malignant neoplasm of digestive organs: Secondary | ICD-10-CM

## 2022-05-26 DIAGNOSIS — Z885 Allergy status to narcotic agent status: Secondary | ICD-10-CM

## 2022-05-26 DIAGNOSIS — E785 Hyperlipidemia, unspecified: Secondary | ICD-10-CM | POA: Diagnosis present

## 2022-05-26 DIAGNOSIS — Y838 Other surgical procedures as the cause of abnormal reaction of the patient, or of later complication, without mention of misadventure at the time of the procedure: Secondary | ICD-10-CM | POA: Diagnosis not present

## 2022-05-26 DIAGNOSIS — Z803 Family history of malignant neoplasm of breast: Secondary | ICD-10-CM

## 2022-05-26 HISTORY — DX: Other injury of spleen, initial encounter: S36.09XA

## 2022-05-26 LAB — CBC WITH DIFFERENTIAL/PLATELET
Abs Immature Granulocytes: 0.01 10*3/uL (ref 0.00–0.07)
Basophils Absolute: 0 10*3/uL (ref 0.0–0.1)
Basophils Relative: 0 %
Eosinophils Absolute: 0 10*3/uL (ref 0.0–0.5)
Eosinophils Relative: 0 %
HCT: 28.6 % — ABNORMAL LOW (ref 36.0–46.0)
Hemoglobin: 8.8 g/dL — ABNORMAL LOW (ref 12.0–15.0)
Immature Granulocytes: 0 %
Lymphocytes Relative: 20 %
Lymphs Abs: 1.6 10*3/uL (ref 0.7–4.0)
MCH: 24.4 pg — ABNORMAL LOW (ref 26.0–34.0)
MCHC: 30.8 g/dL (ref 30.0–36.0)
MCV: 79.2 fL — ABNORMAL LOW (ref 80.0–100.0)
Monocytes Absolute: 0.4 10*3/uL (ref 0.1–1.0)
Monocytes Relative: 5 %
Neutro Abs: 5.9 10*3/uL (ref 1.7–7.7)
Neutrophils Relative %: 75 %
Platelets: 236 10*3/uL (ref 150–400)
RBC: 3.61 MIL/uL — ABNORMAL LOW (ref 3.87–5.11)
RDW: 18.2 % — ABNORMAL HIGH (ref 11.5–15.5)
WBC: 8 10*3/uL (ref 4.0–10.5)
nRBC: 0 % (ref 0.0–0.2)

## 2022-05-26 LAB — URINALYSIS, ROUTINE W REFLEX MICROSCOPIC
Bilirubin Urine: NEGATIVE
Glucose, UA: NEGATIVE mg/dL
Hgb urine dipstick: NEGATIVE
Ketones, ur: NEGATIVE mg/dL
Leukocytes,Ua: NEGATIVE
Nitrite: POSITIVE — AB
Protein, ur: NEGATIVE mg/dL
Specific Gravity, Urine: >1.046 — ABNORMAL HIGH (ref 1.005–1.030)
pH: 5 (ref 5.0–8.0)

## 2022-05-26 LAB — COMPREHENSIVE METABOLIC PANEL
ALT: 19 U/L (ref 0–44)
AST: 21 U/L (ref 15–41)
Albumin: 3.5 g/dL (ref 3.5–5.0)
Alkaline Phosphatase: 54 U/L (ref 38–126)
Anion gap: 7 (ref 5–15)
BUN: 10 mg/dL (ref 6–20)
CO2: 23 mmol/L (ref 22–32)
Calcium: 8.2 mg/dL — ABNORMAL LOW (ref 8.9–10.3)
Chloride: 106 mmol/L (ref 98–111)
Creatinine, Ser: 0.49 mg/dL (ref 0.44–1.00)
GFR, Estimated: >60 mL/min (ref >60)
Glucose, Bld: 113 mg/dL — ABNORMAL HIGH (ref 70–99)
Potassium: 3.4 mmol/L — ABNORMAL LOW (ref 3.5–5.1)
Sodium: 136 mmol/L (ref 135–145)
Total Bilirubin: 0.5 mg/dL (ref 0.3–1.2)
Total Protein: 6.5 g/dL (ref 6.5–8.1)

## 2022-05-26 LAB — I-STAT CHEM 8, ED
BUN: 8 mg/dL (ref 6–20)
Calcium, Ion: 1.17 mmol/L (ref 1.15–1.40)
Chloride: 104 mmol/L (ref 98–111)
Creatinine, Ser: 0.5 mg/dL (ref 0.44–1.00)
Glucose, Bld: 107 mg/dL — ABNORMAL HIGH (ref 70–99)
HCT: 27 % — ABNORMAL LOW (ref 36.0–46.0)
Hemoglobin: 9.2 g/dL — ABNORMAL LOW (ref 12.0–15.0)
Potassium: 3.4 mmol/L — ABNORMAL LOW (ref 3.5–5.1)
Sodium: 140 mmol/L (ref 135–145)
TCO2: 26 mmol/L (ref 22–32)

## 2022-05-26 LAB — TYPE AND SCREEN
ABO/RH(D): O POS
Antibody Screen: NEGATIVE

## 2022-05-26 LAB — POC OCCULT BLOOD, ED: Fecal Occult Bld: NEGATIVE

## 2022-05-26 LAB — LIPASE, BLOOD: Lipase: 27 U/L (ref 11–51)

## 2022-05-26 LAB — TROPONIN I (HIGH SENSITIVITY): Troponin I (High Sensitivity): <2 ng/L (ref <18)

## 2022-05-26 LAB — I-STAT BETA HCG BLOOD, ED (MC, WL, AP ONLY): I-stat hCG, quantitative: <5 m[IU]/mL (ref <5)

## 2022-05-26 MED ORDER — ONDANSETRON HCL 4 MG/2ML IJ SOLN: 4.0000 mg | Freq: Four times a day (QID) | INTRAMUSCULAR | Status: DC | PRN

## 2022-05-26 MED ORDER — DOCUSATE SODIUM 100 MG PO CAPS: 100.0000 mg | ORAL_CAPSULE | Freq: Two times a day (BID) | ORAL | Status: DC

## 2022-05-26 MED ORDER — POTASSIUM CHLORIDE 10 MEQ/100ML IV SOLN: 10.0000 meq | INTRAVENOUS | Status: DC

## 2022-05-26 MED ORDER — METHOCARBAMOL 500 MG PO TABS: 500.0000 mg | ORAL_TABLET | Freq: Three times a day (TID) | ORAL | Status: DC | PRN

## 2022-05-26 MED ORDER — ONDANSETRON 4 MG PO TBDP: 4.0000 mg | ORAL_TABLET | Freq: Four times a day (QID) | ORAL | Status: DC | PRN

## 2022-05-26 MED ORDER — IOHEXOL 300 MG/ML  SOLN
75.0000 mL | Freq: Once | INTRAMUSCULAR | Status: AC | PRN
  Administered 2022-05-26: 75 mL via INTRAVENOUS

## 2022-05-26 MED ORDER — IOHEXOL 300 MG/ML  SOLN
100.0000 mL | Freq: Once | INTRAMUSCULAR | Status: AC | PRN
  Administered 2022-05-26: 100 mL via INTRAVENOUS

## 2022-05-26 MED ORDER — METHOCARBAMOL 1000 MG/10ML IJ SOLN: 500.0000 mg | Freq: Three times a day (TID) | INTRAVENOUS | Status: DC | PRN

## 2022-05-26 MED ORDER — SODIUM CHLORIDE 0.9 % IV SOLN: INTRAVENOUS | Status: DC

## 2022-05-26 MED ORDER — HYDROMORPHONE HCL 1 MG/ML IJ SOLN
1.0000 mg | Freq: Once | INTRAMUSCULAR | Status: AC
  Administered 2022-05-26: 1 mg via INTRAVENOUS
  Filled 2022-05-26: qty 1

## 2022-05-26 MED ORDER — BISACODYL 10 MG RE SUPP: 10.0000 mg | Freq: Every day | RECTAL | Status: DC | PRN

## 2022-05-26 MED ORDER — HYDROMORPHONE HCL 1 MG/ML IJ SOLN
1.0000 mg | INTRAMUSCULAR | Status: DC | PRN
  Administered 2022-05-26: 1 mg via INTRAVENOUS
  Filled 2022-05-26: qty 1

## 2022-05-26 MED ORDER — ONDANSETRON HCL 4 MG/2ML IJ SOLN
4.0000 mg | Freq: Once | INTRAMUSCULAR | Status: AC
  Administered 2022-05-26: 4 mg via INTRAVENOUS
  Filled 2022-05-26: qty 2

## 2022-05-26 MED ORDER — SODIUM CHLORIDE 0.9 % IV BOLUS
1000.0000 mL | Freq: Once | INTRAVENOUS | Status: AC
  Administered 2022-05-26: 1000 mL via INTRAVENOUS

## 2022-05-26 NOTE — ED Notes (Signed)
Pt is not able to provide urine sample at this time but will let us know when she can.

## 2022-05-26 NOTE — ED Triage Notes (Signed)
Pt reports abdominal pain and chest pain since 0300. Denies n/v

## 2022-05-26 NOTE — H&P (Signed)
Tina [name] is an 42 y.o. female.   Chief Complaint: ab pain HPI: 57 yof with pmh of migraines, bipolar, history of gastric bypass with perforated marginal ulcer last year presents after having acute onset of ab pain primarily in her left upper quadrant last night.  She thought this was ulcer again. She is smoking. Not taking any medication. She presented and underwent evaluation showing some anemia as well as a ct scan that shows perisplenic hematoma and free fluid without any active extravasation.  I was asked to see her. She complains of some left uq pain and back pain. Hurts when she takes a deep breath.  I asked her about trauma history and she showed me the bruises on her left upper back.  She wanted me to explain to her husband on the phone what happened and that an injury to this area could have caused the splenic bleed.   Past Medical History:  Diagnosis Date   Allergic rhinitis    Anxiety    Arthritis    In her back and legs per pt, 09/03/2020   Bipolar disorder (HCC)    Diagnosised 10 yrs ago doesn't take any meds 09/03/2020   Chronic thoracic back pain    GERD (gastroesophageal reflux disease)    History of gestational diabetes    Hyperlipidemia    Major depression    Menorrhagia    Migraine    Smokers' cough (HCC)    albuterol (PROVENTIL HFA;VENTOLIN HFA)   Wears glasses    09/03/2020    Past Surgical History:  Procedure Laterality Date   BIOPSY THYROID  06/06/2016   LEFT THYROIDECTOMY   CESAREAN SECTION  05/21/2004   CESAREAN SECTION WITH BILATERAL TUBAL LIGATION Bilateral 08/29/2014   Procedure: CESAREAN SECTION WITH BILATERAL TUBAL LIGATION;  Surgeon: Lavina Hamman, MD;  Location: [facility];  Service: Obstetrics;  Laterality: Bilateral;  MD requests RNFA [name] confirmed 7/29...tms   CHOLECYSTECTOMY N/A 03/26/2015   Procedure: LAPAROSCOPIC CHOLECYSTECTOMY;  Surgeon: De Blanch Kinsinger, MD;  Location: [facility];  Service: General;  Laterality: N/A;    DILATATION & CURETTAGE/HYSTEROSCOPY WITH MYOSURE N/A 09/05/2020   Procedure: DILATATION & CURETTAGE/HYSTEROSCOPY WITH MYOSURE;  Surgeon: Lavina Hamman, MD;  Location: [facility];  Service: Gynecology;  Laterality: N/A;   DILITATION & CURRETTAGE/HYSTROSCOPY WITH NOVASURE ABLATION N/A 01/28/2016   Procedure: DILATATION & CURETTAGE/HYSTEROSCOPY WITH NOVASURE ABLATION;  Surgeon: Lavina Hamman, MD;  Location: [facility];  Service: Gynecology;  Laterality: N/A;   GASTRIC BYPASS     done in 2021, 09/03/2020   LAPAROTOMY N/A 11/26/2021   Procedure: EXPLORATORY LAPAROTOMY, GRAHAM PATCH REPAIR OF PERFORATED ULCER;  Surgeon: Manus Rudd, MD;  Location: [facility];  Service: General;  Laterality: N/A;   LUMBAR MICRODISCECTOMY  11/17/2007   L5-  S1   THYROIDECTOMY Left 06/06/2016   Procedure: LEFT THYROIDECTOMY AND CORE BIOPSY OF RIGHT THYROID;  Surgeon: Christia Reading, MD;  Location[facility];  Service: ENT;  Laterality: Left;   TONSILLECTOMY  2001   TUBAL LIGATION      Family History  Problem Relation Age of Onset   Depression Mother    Mental illness Mother    Diabetes Father    Hypertension Father    Hyperlipidemia Father    Diabetes Maternal Grandmother        Dialysis   Hypertension Maternal Grandmother    Heart disease Maternal Grandmother    Cancer Paternal Grandmother        Breast Cancer  Cancer Paternal Grandfather        colon   Colon cancer Paternal Grandfather    Diabetes Brother    Cancer Other        colon   Kidney failure Other        dialysis   Colon cancer Paternal Uncle    Esophageal cancer Neg Hx    Stomach cancer Neg Hx    Rectal cancer Neg Hx    Social History:  reports that she quit smoking about 6 years ago. Her smoking use included cigarettes. She has a 10.00 pack-year smoking history. She has never used smokeless tobacco. She reports that she does not drink alcohol and does not use drugs.  Allergies:  Allergies  Allergen  Reactions   Oxycodone Itching and Other (See Comments)    SEVERE itching with Percocet   Tramadol Other (See Comments)    "Triggers migraines"   Wellbutrin [Bupropion] Hives    No current facility-administered medications on file prior to encounter.   Current Outpatient Medications on File Prior to Encounter  Medication Sig Dispense Refill   acetaminophen (TYLENOL) 650 MG CR tablet Take 1,300 mg by mouth every 8 (eight) hours as needed for pain.        Results for orders placed or performed during the hospital encounter of 05/26/22 (from the past 48 hour(s))  Comprehensive metabolic panel     Status: Abnormal   Collection Time: 05/26/22  5:33 PM  Result Value Ref Range   Sodium 136 135 - 145 mmol/L   Potassium 3.4 (L) 3.5 - 5.1 mmol/L   Chloride 106 98 - 111 mmol/L   CO2 23 22 - 32 mmol/L   Glucose, Bld 113 (H) 70 - 99 mg/dL    Comment: Glucose reference range applies only to samples taken after fasting for at least 8 hours.   BUN 10 6 - 20 mg/dL   Creatinine, Ser 4.09 0.44 - 1.00 mg/dL   Calcium 8.2 (L) 8.9 - 10.3 mg/dL   Total Protein 6.5 6.5 - 8.1 g/dL   Albumin 3.5 3.5 - 5.0 g/dL   AST 21 15 - 41 U/L   ALT 19 0 - 44 U/L   Alkaline Phosphatase 54 38 - 126 U/L   Total Bilirubin 0.5 0.3 - 1.2 mg/dL   GFR, Estimated >81 >19 mL/min    Comment: (NOTE) Calculated using the CKD-EPI Creatinine Equation (2021)    Anion gap 7 5 - 15    Comment: Performed at [facility], [address] [place], [place] [address]  Lipase, blood     Status: None   Collection Time: 05/26/22  5:33 PM  Result Value Ref Range   Lipase 27 11 - 51 U/L    Comment: Performed at [facility], [address], [place], [place] [address]  CBC with Diff     Status: Abnormal   Collection Time: 05/26/22  5:33 PM  Result Value Ref Range   WBC 8.0 4.0 - 10.5 K/uL   RBC 3.61 (L) 3.87 - 5.11 MIL/uL   Hemoglobin 8.8 (L) 12.0 - 15.0 g/dL   HCT 30.8 (L) 65.7 - 84.6 %    MCV 79.2 (L) 80.0 - 100.0 fL   MCH 24.4 (L) 26.0 - 34.0 pg   MCHC 30.8 30.0 - 36.0 g/dL   RDW 96.2 (H) 95.2 - 84.1 %   Platelets 236 150 - 400 K/uL   nRBC 0.0 0.0 - 0.2 %   Neutrophils Relative %  75 %   Neutro Abs 5.9 1.7 - 7.7 K/uL   Lymphocytes Relative 20 %   Lymphs Abs 1.6 0.7 - 4.0 K/uL   Monocytes Relative 5 %   Monocytes Absolute 0.4 0.1 - 1.0 K/uL   Eosinophils Relative 0 %   Eosinophils Absolute 0.0 0.0 - 0.5 K/uL   Basophils Relative 0 %   Basophils Absolute 0.0 0.0 - 0.1 K/uL   Immature Granulocytes 0 %   Abs Immature Granulocytes 0.01 0.00 - 0.07 K/uL    Comment: Performed at [facility], [address], [place], [place] [address]  Troponin I (High Sensitivity)     Status: None   Collection Time: 05/26/22  5:33 PM  Result Value Ref Range   Troponin I (High Sensitivity) <2 <18 ng/L    Comment: (NOTE) Elevated high sensitivity troponin I (hsTnI) values and significant  changes across serial measurements may suggest ACS but many other  chronic and acute conditions are known to elevate hsTnI results.  Refer to the "Links" section for chest pain algorithms and additional  guidance. Performed at [facility], [address], [place], [place] [address]   I-Stat beta hCG blood, ED     Status: None   Collection Time: 05/26/22  5:44 PM  Result Value Ref Range   I-stat hCG, quantitative <5.0 <5 mIU/mL   Comment 3            Comment:   GEST. AGE      CONC.  (mIU/mL)   <=1 WEEK        5 - 50     2 WEEKS       50 - 500     3 WEEKS       100 - 10,000     4 WEEKS     1,000 - 30,000        FEMALE AND NON-PREGNANT FEMALE:     LESS THAN 5 mIU/mL   I-stat chem 8, ED (not at [facility], [facility] or [facility])     Status: Abnormal   Collection Time: 05/26/22  5:45 PM  Result Value Ref Range   Sodium 140 135 - 145 mmol/L   Potassium 3.4 (L) 3.5 - 5.1 mmol/L   Chloride 104 98 - 111 mmol/L   BUN 8 6 - 20 mg/dL   Creatinine, Ser 0.98 0.44 - 1.00 mg/dL    Glucose, Bld 119 (H) 70 - 99 mg/dL    Comment: Glucose reference range applies only to samples taken after fasting for at least 8 hours.   Calcium, Ion 1.17 1.15 - 1.40 mmol/L   TCO2 26 22 - 32 mmol/L   Hemoglobin 9.2 (L) 12.0 - 15.0 g/dL   HCT 14.7 (L) 82.9 - 56.2 %  POC occult blood, ED     Status: None   Collection Time: 05/26/22  6:36 PM  Result Value Ref Range   Fecal Occult Bld NEGATIVE NEGATIVE  Urinalysis, Routine w reflex microscopic -Urine, Clean Catch     Status: Abnormal   Collection Time: 05/26/22  8:30 PM  Result Value Ref Range   Color, Urine YELLOW YELLOW   APPearance CLEAR CLEAR   Specific Gravity, Urine >1.046 (H) 1.005 - 1.030   pH 5.0 5.0 - 8.0   Glucose, UA NEGATIVE NEGATIVE mg/dL   Hgb urine dipstick NEGATIVE NEGATIVE   Bilirubin Urine NEGATIVE NEGATIVE   Ketones, ur NEGATIVE NEGATIVE mg/dL   Protein, ur NEGATIVE NEGATIVE mg/dL   Nitrite  POSITIVE (A) NEGATIVE   Leukocytes,Ua NEGATIVE NEGATIVE   RBC / HPF 0-5 0 - 5 RBC/hpf   WBC, UA 6-10 0 - 5 WBC/hpf   Bacteria, UA RARE (A) NONE SEEN   Squamous Epithelial / HPF 0-5 0 - 5 /HPF   Mucus PRESENT     Comment: Performed at [facility], [address], [place], [place] [address]   CT ABDOMEN PELVIS W CONTRAST  Result Date: 05/26/2022 CLINICAL DATA:  Abdominal pain for several hours, initial encounter EXAM: CT ABDOMEN AND PELVIS WITH CONTRAST TECHNIQUE: Multidetector CT imaging of the abdomen and pelvis was performed using the standard protocol following bolus administration of intravenous contrast. RADIATION DOSE REDUCTION: This exam was performed according to the departmental dose-optimization program which includes automated exposure control, adjustment of the mA and/or kV according to patient size and/or use of iterative reconstruction technique. CONTRAST:  OMNIPAQUE IOHEXOL 300 MG/ML  SOLN COMPARISON:  11/26/2021 FINDINGS: Lower chest: No acute abnormality. Hepatobiliary: Gallbladder has  been surgically removed. Liver is within normal limits. Mild perihepatic ascites is seen. Pancreas: The pancreas is within normal limits. Spleen: Spleen is significantly displaced medially secondary to perisplenic fluid which measures 72 Hounsfield units and felt to represent hemorrhage. Additionally there is a geographic area of decreased attenuation in the posterior aspect of the spleen best noted on image number 22 of series 2. Adrenals/Urinary Tract: Adrenal glands are within normal limits. Kidneys demonstrate a normal enhancement pattern bilaterally. No calculi or obstructive changes are noted. The bladder is partially distended. Stomach/Bowel: No obstructive or inflammatory changes of the colon are seen. Postsurgical changes are noted in the stomach consistent with the history of gastric bypass as well as multiple revisions. No small bowel abnormality is noted. The appendix is well seen and within normal limits. Vascular/Lymphatic: No significant vascular findings are present. No enlarged abdominal or pelvic lymph nodes. Reproductive: Uterus and bilateral adnexa are unremarkable. Other: No hernia is noted. Considerable free fluid is noted within the pelvis as well as surrounding the liver and spleen. Some of this is of significantly decreased attenuation with respect to that adjacent to the spleen. Musculoskeletal: No acute rib abnormality is noted. No bony abnormality is seen. IMPRESSION: Area of decreased attenuation within the posteromedial aspect of the spleen with adjacent changes of perisplenic hemorrhage. No active extravasation is noted at this time. The etiology of this is uncertain as no definitive trauma history is given. Correlate with patient's history a in that regard. This area of decreased attenuation was not seen on recent exam in 2023 to suggest focal hemangioma with rupture. Surgical consultation is recommended. Postsurgical changes consistent with the given clinical history. No other focal  abnormality is noted. Critical Value/emergent results were called by telephone at the time of interpretation on 05/26/2022 at 8:16 pm to Dr. Linwood Dibbles , who verbally acknowledged these results. Electronically Signed   By: Alcide Clever M.D.   On: 05/26/2022 20:23   DG Abd Acute W/Chest  Result Date: 05/26/2022 CLINICAL DATA:  Abdominal pain and rectal bleeding for several hours, initial encounter EXAM: DG ABDOMEN ACUTE WITH 1 VIEW CHEST COMPARISON:  11/26/2021 11/30/2021 FINDINGS: Cardiac shadow is within normal limits. Lungs are well aerated bilaterally. No focal infiltrate or effusion is seen. No bony abnormality is noted. Scattered large and small bowel gas is noted. Postsurgical changes in the abdomen are again seen. Bony structures show degenerative change of the lumbar spine. No free air is seen. IMPRESSION: No acute abnormality in  the chest and abdomen as described. Electronically Signed   By: Alcide Clever M.D.   On: 05/26/2022 17:53    Review of Systems  Constitutional:  Positive for fatigue. Negative for fever.  Respiratory:  Positive for shortness of breath.   Gastrointestinal:  Positive for abdominal pain and nausea. Negative for blood in stool and vomiting.  All other systems reviewed and are negative.   Blood pressure (!) 109/94, pulse 75, temperature 98.1 F (36.7 C), temperature source Oral, resp. rate 16, height 5\' 7"  (1.702 m), weight 56.7 kg, last menstrual period 05/19/2022, SpO2 100 %. Physical Exam Vitals reviewed.  Constitutional:      Appearance: She is well-developed.  HENT:     Head: Normocephalic and atraumatic.     Mouth/Throat:     Mouth: Mucous membranes are moist.     Pharynx: Oropharynx is clear.  Eyes:     General: No scleral icterus.    Pupils: Pupils are equal, round, and reactive to light.  Cardiovascular:     Rate and Rhythm: Normal rate and regular rhythm.  Pulmonary:     Effort: Pulmonary effort is normal.     Breath sounds: No wheezing. Rhonchi:  left upper back tenderness with ecchymosis present. Chest:     Chest wall: Tenderness present.  Abdominal:     General: A surgical scar is present. There is no distension.     Palpations: Abdomen is soft.     Tenderness: There is abdominal tenderness in the left upper quadrant.     Hernia: No hernia is present.     Comments: Well healed scar  Skin:    General: Skin is warm and dry.     Capillary Refill: Capillary refill takes less than 2 seconds.  Neurological:     General: No focal deficit present.     Mental Status: She is alert.  Psychiatric:        Mood and Affect: Mood normal.        Behavior: Behavior normal.     Assessment/Plan Splenic hemorrhage -this does appear to be traumatic in nature -will need SW involvement -admit to [facility] on trauma service -will check ct chest as well -replace potassium -will let her have clears -check cbc q 8 hours -discussed if hb drops or clinically deteriorates will first try IR for embolization  I reviewed ct a/p and shows splenic hemorrhage, discussed case with Dr Lynelle Doctor and Dr Janee Morn (trauma at [facility]).  Reviewed cbc and cmet    Emelia Loron, MD 05/26/2022, 9:18 PM

## 2022-05-26 NOTE — ED Notes (Signed)
Pt wheeled to the restroom to collect urine

## 2022-05-26 NOTE — ED Provider Notes (Signed)
[place] EMERGENCY DEPARTMENT AT [facility] Provider Note   CSN: [id number] Arrival date & time: 05/26/22  1619     History  Chief Complaint  Patient presents with   Abdominal Pain    Tina [name] is a 42 y.o. female.   Abdominal Pain    Patient has history of migraines, depression, anxiety, reflux, bipolar disorder, history of cholecystectomy, tubal ligation, gastric bypass, and laparotomy.  Patient states she had complications from her bypass.  She had a gastrojejunal anastomosis perforation.  She had a laparoscopic surgery including washout and a Graham patch at the GJ anastomosis.  Patient had recurrent abdominal pain following that episode that prompted her to come to the emergency room in November of last year.  Patient was found to have a recurrent area of extraluminal gas and adjacent free air.  Patient underwent exploratory laparotomy for her perforated ulcer.  Patient is a chronic smoker but denies any alcohol or drug use.  She presents to the ED with complaints of severe abdominal pain that is also going up into her chest.  She has nausea and vomiting without hematemesis.  She has noticed blood in her stool today.  Patient states this presentation is very similar to her previous episodes that required abdominal surgery.  Home Medications Prior to Admission medications   Medication Sig Start Date End Date Taking? Authorizing Provider  acetaminophen (TYLENOL) 650 MG CR tablet Take 1,300 mg by mouth every 8 (eight) hours as needed for pain.    [provider]      Allergies    Oxycodone, Tramadol, and Wellbutrin [bupropion]    Review of Systems   Review of Systems  Gastrointestinal:  Positive for abdominal pain.    Physical Exam Updated Vital Signs BP (!) 109/94   Pulse 75   Temp 98.1 F (36.7 C) (Oral)   Resp 16   Ht 1.702 m (5\' 7" )   Wt 56.7 kg   LMP 05/19/2022   SpO2 100%   BMI 19.58 kg/m  Physical Exam Vitals and nursing note  reviewed.  Constitutional:      General: She is in acute distress.     Appearance: She is ill-appearing.  HENT:     Head: Normocephalic and atraumatic.     Right Ear: External ear normal.     Left Ear: External ear normal.  Eyes:     General: No scleral icterus.       Right eye: No discharge.        Left eye: No discharge.     Conjunctiva/sclera: Conjunctivae normal.  Neck:     Trachea: No tracheal deviation.  Cardiovascular:     Rate and Rhythm: Normal rate and regular rhythm.  Pulmonary:     Effort: Pulmonary effort is normal. No respiratory distress.     Breath sounds: Normal breath sounds. No stridor. No wheezing or rales.  Abdominal:     General: Bowel sounds are normal. There is no distension.     Palpations: Abdomen is soft.     Tenderness: There is generalized abdominal tenderness. There is guarding. There is no rebound.     Hernia: No hernia is present.  Musculoskeletal:        General: No tenderness or deformity.     Cervical back: Neck supple.  Skin:    General: Skin is warm and dry.     Findings: No rash.  Neurological:     General: No focal deficit present.  Mental Status: She is alert.     Cranial Nerves: No cranial nerve deficit, dysarthria or facial asymmetry.     Sensory: No sensory deficit.     Motor: No abnormal muscle tone or seizure activity.     Coordination: Coordination normal.  Psychiatric:        Mood and Affect: Mood normal.     ED Results / Procedures / Treatments   Labs (all labs ordered are listed, but only abnormal results are displayed) Labs Reviewed  COMPREHENSIVE METABOLIC PANEL - Abnormal; Notable for the following components:      Result Value   Potassium 3.4 (*)    Glucose, Bld 113 (*)    Calcium 8.2 (*)    All other components within normal limits  CBC WITH DIFFERENTIAL/PLATELET - Abnormal; Notable for the following components:   RBC 3.61 (*)    Hemoglobin 8.8 (*)    HCT 28.6 (*)    MCV 79.2 (*)    MCH 24.4 (*)    RDW  18.2 (*)    All other components within normal limits  I-STAT CHEM 8, ED - Abnormal; Notable for the following components:   Potassium 3.4 (*)    Glucose, Bld 107 (*)    Hemoglobin 9.2 (*)    HCT 27.0 (*)    All other components within normal limits  LIPASE, BLOOD  URINALYSIS, ROUTINE W REFLEX MICROSCOPIC  I-STAT BETA HCG BLOOD, ED ([facility], [facility], [facility] ONLY)  POC OCCULT BLOOD, ED  TYPE AND SCREEN  TROPONIN I (HIGH SENSITIVITY)    EKG None  Radiology CT ABDOMEN PELVIS W CONTRAST  Result Date: 05/26/2022 CLINICAL DATA:  Abdominal pain for several hours, initial encounter EXAM: CT ABDOMEN AND PELVIS WITH CONTRAST TECHNIQUE: Multidetector CT imaging of the abdomen and pelvis was performed using the standard protocol following bolus administration of intravenous contrast. RADIATION DOSE REDUCTION: This exam was performed according to the departmental dose-optimization program which includes automated exposure control, adjustment of the mA and/or kV according to patient size and/or use of iterative reconstruction technique. CONTRAST:  OMNIPAQUE IOHEXOL 300 MG/ML  SOLN COMPARISON:  11/26/2021 FINDINGS: Lower chest: No acute abnormality. Hepatobiliary: Gallbladder has been surgically removed. Liver is within normal limits. Mild perihepatic ascites is seen. Pancreas: The pancreas is within normal limits. Spleen: Spleen is significantly displaced medially secondary to perisplenic fluid which measures 72 Hounsfield units and felt to represent hemorrhage. Additionally there is a geographic area of decreased attenuation in the posterior aspect of the spleen best noted on image number 22 of series 2. Adrenals/Urinary Tract: Adrenal glands are within normal limits. Kidneys demonstrate a normal enhancement pattern bilaterally. No calculi or obstructive changes are noted. The bladder is partially distended. Stomach/Bowel: No obstructive or inflammatory changes of the colon are seen. Postsurgical changes are noted  in the stomach consistent with the history of gastric bypass as well as multiple revisions. No small bowel abnormality is noted. The appendix is well seen and within normal limits. Vascular/Lymphatic: No significant vascular findings are present. No enlarged abdominal or pelvic lymph nodes. Reproductive: Uterus and bilateral adnexa are unremarkable. Other: No hernia is noted. Considerable free fluid is noted within the pelvis as well as surrounding the liver and spleen. Some of this is of significantly decreased attenuation with respect to that adjacent to the spleen. Musculoskeletal: No acute rib abnormality is noted. No bony abnormality is seen. IMPRESSION: Area of decreased attenuation within the posteromedial aspect of the spleen with adjacent changes of perisplenic hemorrhage.  No active extravasation is noted at this time. The etiology of this is uncertain as no definitive trauma history is given. Correlate with patient's history a in that regard. This area of decreased attenuation was not seen on recent exam in 2023 to suggest focal hemangioma with rupture. Surgical consultation is recommended. Postsurgical changes consistent with the given clinical history. No other focal abnormality is noted. Critical Value/emergent results were called by telephone at the time of interpretation on 05/26/2022 at 8:16 pm to Dr. Linwood Dibbles , who verbally acknowledged these results. Electronically Signed   By: Alcide Clever M.D.   On: 05/26/2022 20:23   DG Abd Acute W/Chest  Result Date: 05/26/2022 CLINICAL DATA:  Abdominal pain and rectal bleeding for several hours, initial encounter EXAM: DG ABDOMEN ACUTE WITH 1 VIEW CHEST COMPARISON:  11/26/2021 11/30/2021 FINDINGS: Cardiac shadow is within normal limits. Lungs are well aerated bilaterally. No focal infiltrate or effusion is seen. No bony abnormality is noted. Scattered large and small bowel gas is noted. Postsurgical changes in the abdomen are again seen. Bony structures  show degenerative change of the lumbar spine. No free air is seen. IMPRESSION: No acute abnormality in the chest and abdomen as described. Electronically Signed   By: Alcide Clever M.D.   On: 05/26/2022 17:53    Procedures .Critical Care  Performed by: Linwood Dibbles, MD Authorized by: Linwood Dibbles, MD   Critical care provider statement:    Critical care time (minutes):  30   Critical care was time spent personally by me on the following activities:  Development of treatment plan with patient or surrogate, discussions with consultants, evaluation of patient's response to treatment, examination of patient, ordering and review of laboratory studies, ordering and review of radiographic studies, ordering and performing treatments and interventions, pulse oximetry, re-evaluation of patient's condition and review of old charts     Medications Ordered in ED Medications  sodium chloride 0.9 % bolus 1,000 mL (1,000 mLs Intravenous New Bag/Given 05/26/22 1742)    And  0.9 %  sodium chloride infusion ( Intravenous New Bag/Given 05/26/22 1744)  HYDROmorphone (DILAUDID) injection 1 mg (1 mg Intravenous Given 05/26/22 1742)  ondansetron (ZOFRAN) injection 4 mg (4 mg Intravenous Given 05/26/22 1742)  iohexol (OMNIPAQUE) 300 MG/ML solution 100 mL (100 mLs Intravenous Contrast Given 05/26/22 1951)    ED Course/ Medical Decision Making/ A&P Clinical Course as of 05/26/22 2055  Mon May 26, 2022  1801 CBC with Diff(!) Hemoglobin decreased compared to previous. [JK]  1928 abdominal series without acute findings.  Metabolic panel without signs of severe dehydration [JK]  2018 Reviewed CT with Dr Kennis Carina.  CT scan shows perisplenic hemorrhage and free fluid around the abdomen.  No active extravasation on CT. [JK]  2045 Reviewed case with Dr. Dwain Sarna.  He states no surgical intervention recommended at this time.  If patient has any signs of recurrent bleeding she would need to go to interventional radiology.  Would  recommend admission to [facility] trauma  service [JK]  2047 Patient initially denied any trauma however she did call me back into the room and asked me if an injury to her left side would cause this type of bleeding.  I told the patient yes.  I asked her if she was injured in that area and she said "I will not say anything more about that but could you please come into the room when my husband returns and explained to him that an injury to the left side would  cause this internal bleeding." [JK]  2051 Case reviewed with Dr. Anette Riedel [JK]    Clinical Course User Index [JK] Linwood Dibbles, MD                             Medical Decision Making Problems Addressed: Acute blood loss anemia: acute illness or injury that poses a threat to life or bodily functions Hemorrhage of spleen: acute illness or injury that poses a threat to life or bodily functions  Amount and/or Complexity of Data Reviewed Labs: ordered. Decision-making details documented in ED Course. Radiology: ordered and independent interpretation performed. Discussion of management or test interpretation with external provider(s): Feel general surgery  Risk Prescription drug management. Decision regarding hospitalization.   Patient presented to the ED for evaluation of abdominal pain.  Patient has history of prior gastric bypass surgery.  She has had perforations at the anastomosis site requiring surgical repair.  Patient denied any injury initially but had sudden onset of abdominal pain and she admitted to some rectal bleeding.  On exam she was diffusely tender.  No evidence of active bleeding on rectal exam.  Hemoccult was negative.  Patient did have a drop in her hemoglobin but blood pressure remained stable and no indications for transfusion at this time.  CT scan shows evidence of perisplenic hemorrhage without active extravasation.  Patient also has evidence of free fluid consistent with hemorrhage.  Patient later asked me to come into the room  and answer if an injury to her left side could potentially cause this type of bleeding.  I explained to her that yes it would.  I asked her if she was injured on that left side and the patient told me she would not explain any further.  However she would like me to come into the room and explained to her husband how an injury could cause this type of bleeding.  I am rather suspicious that the patient has had some sort of trauma causing this injury although at this time she will not elaborate further.  I have discussed the case with Dr. Dwain Sarna and the plan is to admit the patient to the trauma service at [facility]        Final Clinical Impression(s) / ED Diagnoses Final diagnoses:  Hemorrhage of spleen  Acute blood loss anemia    Rx / DC Orders ED Discharge Orders     None         Linwood Dibbles, MD 05/26/22 2055

## 2022-05-26 NOTE — ED Notes (Signed)
Patient refused any further treatment, blood work or being transferred to [facility]. This nurse went and informed Dr. Lynelle Doctor and he attempted to go speak with patient and she states she is leaving. IV removed and member left the ER AMA. Dr Emelia Loron made aware also. JRPRN

## 2022-05-26 NOTE — ED Notes (Signed)
Sent urine with culture  

## 2022-06-03 DIAGNOSIS — M5430 Sciatica, unspecified side: Secondary | ICD-10-CM | POA: Diagnosis present

## 2022-07-26 ENCOUNTER — Emergency Department (HOSPITAL_COMMUNITY): Payer: Medicaid Other

## 2022-07-26 ENCOUNTER — Other Ambulatory Visit: Payer: Self-pay

## 2022-07-26 ENCOUNTER — Encounter (HOSPITAL_COMMUNITY): Payer: Self-pay

## 2022-07-26 ENCOUNTER — Inpatient Hospital Stay (HOSPITAL_COMMUNITY)
Admission: EM | Admit: 2022-07-26 | Discharge: 2022-08-01 | DRG: 326 | Disposition: A | Discharge status: Home Health | Payer: Medicaid Other | Attending: Surgery | Admitting: Surgery

## 2022-07-26 DIAGNOSIS — K9589 Other complications of other bariatric procedure: Principal | ICD-10-CM | POA: Diagnosis present

## 2022-07-26 DIAGNOSIS — F17200 Nicotine dependence, unspecified, uncomplicated: Secondary | ICD-10-CM | POA: Diagnosis present

## 2022-07-26 DIAGNOSIS — G43909 Migraine, unspecified, not intractable, without status migrainosus: Secondary | ICD-10-CM | POA: Diagnosis present

## 2022-07-26 DIAGNOSIS — F1020 Alcohol dependence, uncomplicated: Secondary | ICD-10-CM | POA: Insufficient documentation

## 2022-07-26 DIAGNOSIS — Z91410 Personal history of adult physical and sexual abuse: Secondary | ICD-10-CM

## 2022-07-26 DIAGNOSIS — Z91199 Patient's noncompliance with other medical treatment and regimen due to unspecified reason: Secondary | ICD-10-CM

## 2022-07-26 DIAGNOSIS — E43 Unspecified severe protein-calorie malnutrition: Secondary | ICD-10-CM | POA: Insufficient documentation

## 2022-07-26 DIAGNOSIS — T471X6A Underdosing of other antacids and anti-gastric-secretion drugs, initial encounter: Secondary | ICD-10-CM | POA: Diagnosis present

## 2022-07-26 DIAGNOSIS — Z818 Family history of other mental and behavioral disorders: Secondary | ICD-10-CM

## 2022-07-26 DIAGNOSIS — F988 Other specified behavioral and emotional disorders with onset usually occurring in childhood and adolescence: Secondary | ICD-10-CM | POA: Diagnosis present

## 2022-07-26 DIAGNOSIS — F17210 Nicotine dependence, cigarettes, uncomplicated: Secondary | ICD-10-CM | POA: Diagnosis present

## 2022-07-26 DIAGNOSIS — K285 Chronic or unspecified gastrojejunal ulcer with perforation: Secondary | ICD-10-CM | POA: Insufficient documentation

## 2022-07-26 DIAGNOSIS — Z885 Allergy status to narcotic agent status: Secondary | ICD-10-CM

## 2022-07-26 DIAGNOSIS — F411 Generalized anxiety disorder: Secondary | ICD-10-CM | POA: Diagnosis present

## 2022-07-26 DIAGNOSIS — Z800 Family history of malignant neoplasm of digestive organs: Secondary | ICD-10-CM

## 2022-07-26 DIAGNOSIS — Z888 Allergy status to other drugs, medicaments and biological substances status: Secondary | ICD-10-CM

## 2022-07-26 DIAGNOSIS — Z9884 Bariatric surgery status: Secondary | ICD-10-CM

## 2022-07-26 DIAGNOSIS — Z803 Family history of malignant neoplasm of breast: Secondary | ICD-10-CM

## 2022-07-26 DIAGNOSIS — F1021 Alcohol dependence, in remission: Secondary | ICD-10-CM | POA: Insufficient documentation

## 2022-07-26 DIAGNOSIS — Z5982 Transportation insecurity: Secondary | ICD-10-CM

## 2022-07-26 DIAGNOSIS — E785 Hyperlipidemia, unspecified: Secondary | ICD-10-CM | POA: Diagnosis present

## 2022-07-26 DIAGNOSIS — R198 Other specified symptoms and signs involving the digestive system and abdomen: Secondary | ICD-10-CM | POA: Diagnosis present

## 2022-07-26 DIAGNOSIS — Z8632 Personal history of gestational diabetes: Secondary | ICD-10-CM

## 2022-07-26 DIAGNOSIS — M5430 Sciatica, unspecified side: Secondary | ICD-10-CM | POA: Diagnosis present

## 2022-07-26 DIAGNOSIS — Z91411 Personal history of adult psychological abuse: Secondary | ICD-10-CM

## 2022-07-26 DIAGNOSIS — K659 Peritonitis, unspecified: Secondary | ICD-10-CM | POA: Diagnosis present

## 2022-07-26 DIAGNOSIS — Z83438 Family history of other disorder of lipoprotein metabolism and other lipidemia: Secondary | ICD-10-CM

## 2022-07-26 DIAGNOSIS — Z8719 Personal history of other diseases of the digestive system: Secondary | ICD-10-CM

## 2022-07-26 DIAGNOSIS — Z833 Family history of diabetes mellitus: Secondary | ICD-10-CM

## 2022-07-26 DIAGNOSIS — Z8249 Family history of ischemic heart disease and other diseases of the circulatory system: Secondary | ICD-10-CM

## 2022-07-26 DIAGNOSIS — F419 Anxiety disorder, unspecified: Secondary | ICD-10-CM | POA: Diagnosis present

## 2022-07-26 DIAGNOSIS — Z91128 Patient's intentional underdosing of medication regimen for other reason: Secondary | ICD-10-CM

## 2022-07-26 DIAGNOSIS — K631 Perforation of intestine (nontraumatic): Principal | ICD-10-CM

## 2022-07-26 DIAGNOSIS — E890 Postprocedural hypothyroidism: Secondary | ICD-10-CM | POA: Diagnosis present

## 2022-07-26 LAB — COMPREHENSIVE METABOLIC PANEL
ALT: 17 U/L (ref 0–44)
AST: 16 U/L (ref 15–41)
Albumin: 3 g/dL — ABNORMAL LOW (ref 3.5–5.0)
Alkaline Phosphatase: 63 U/L (ref 38–126)
Anion gap: 5 (ref 5–15)
BUN: 13 mg/dL (ref 6–20)
CO2: 28 mmol/L (ref 22–32)
Calcium: 8.2 mg/dL — ABNORMAL LOW (ref 8.9–10.3)
Chloride: 101 mmol/L (ref 98–111)
Creatinine, Ser: 0.54 mg/dL (ref 0.44–1.00)
GFR, Estimated: >60 mL/min (ref >60)
Glucose, Bld: 105 mg/dL — ABNORMAL HIGH (ref 70–99)
Potassium: 3.5 mmol/L (ref 3.5–5.1)
Sodium: 134 mmol/L — ABNORMAL LOW (ref 135–145)
Total Bilirubin: 0.6 mg/dL (ref 0.3–1.2)
Total Protein: 6.1 g/dL — ABNORMAL LOW (ref 6.5–8.1)

## 2022-07-26 LAB — CBC
HCT: 40.7 % (ref 36.0–46.0)
Hemoglobin: 12.3 g/dL (ref 12.0–15.0)
MCH: 23.7 pg — ABNORMAL LOW (ref 26.0–34.0)
MCHC: 30.2 g/dL (ref 30.0–36.0)
MCV: 78.3 fL — ABNORMAL LOW (ref 80.0–100.0)
Platelets: 342 10*3/uL (ref 150–400)
RBC: 5.2 MIL/uL — ABNORMAL HIGH (ref 3.87–5.11)
RDW: 16.1 % — ABNORMAL HIGH (ref 11.5–15.5)
WBC: 5.7 10*3/uL (ref 4.0–10.5)
nRBC: 0 % (ref 0.0–0.2)

## 2022-07-26 LAB — I-STAT CHEM 8, ED
BUN: 13 mg/dL (ref 6–20)
Calcium, Ion: 1.09 mmol/L — ABNORMAL LOW (ref 1.15–1.40)
Chloride: 101 mmol/L (ref 98–111)
Creatinine, Ser: 0.6 mg/dL (ref 0.44–1.00)
Glucose, Bld: 99 mg/dL (ref 70–99)
HCT: 40 % (ref 36.0–46.0)
Hemoglobin: 13.6 g/dL (ref 12.0–15.0)
Potassium: 3.7 mmol/L (ref 3.5–5.1)
Sodium: 137 mmol/L (ref 135–145)
TCO2: 29 mmol/L (ref 22–32)

## 2022-07-26 LAB — TYPE AND SCREEN
ABO/RH(D): O POS
Antibody Screen: NEGATIVE

## 2022-07-26 LAB — HCG, SERUM, QUALITATIVE: Preg, Serum: NEGATIVE

## 2022-07-26 LAB — POC OCCULT BLOOD, ED

## 2022-07-26 MED ORDER — HYDROMORPHONE HCL 1 MG/ML IJ SOLN
1.0000 mg | Freq: Once | INTRAMUSCULAR | Status: AC
  Administered 2022-07-26: 1 mg via INTRAVENOUS
  Filled 2022-07-26: qty 1

## 2022-07-26 MED ORDER — ONDANSETRON HCL 4 MG/2ML IJ SOLN
INTRAMUSCULAR | Status: AC
  Filled 2022-07-26: qty 2

## 2022-07-26 MED ORDER — CHLORHEXIDINE GLUCONATE CLOTH 2 % EX PADS
6.0000 | MEDICATED_PAD | Freq: Once | CUTANEOUS | Status: AC
  Administered 2022-08-01: 6 via TOPICAL

## 2022-07-26 MED ORDER — FENTANYL CITRATE (PF) 250 MCG/5ML IJ SOLN
INTRAMUSCULAR | Status: AC
  Filled 2022-07-26: qty 5

## 2022-07-26 MED ORDER — ROCURONIUM BROMIDE 10 MG/ML (PF) SYRINGE
PREFILLED_SYRINGE | INTRAVENOUS | Status: AC
  Filled 2022-07-26: qty 10

## 2022-07-26 MED ORDER — MIDAZOLAM HCL 2 MG/2ML IJ SOLN
INTRAMUSCULAR | Status: AC
  Filled 2022-07-26: qty 2

## 2022-07-26 MED ORDER — LIDOCAINE HCL (PF) 2 % IJ SOLN
INTRAMUSCULAR | Status: AC
  Filled 2022-07-26: qty 5

## 2022-07-26 MED ORDER — PIPERACILLIN-TAZOBACTAM 3.375 G IVPB 30 MIN
3.3750 g | Freq: Once | INTRAVENOUS | Status: AC
  Administered 2022-07-26: 3.375 g via INTRAVENOUS
  Filled 2022-07-26: qty 50

## 2022-07-26 MED ORDER — FENTANYL CITRATE PF 50 MCG/ML IJ SOSY
50.0000 ug | PREFILLED_SYRINGE | Freq: Once | INTRAMUSCULAR | Status: AC
  Administered 2022-07-26: 50 ug via INTRAVENOUS
  Filled 2022-07-26: qty 1

## 2022-07-26 MED ORDER — DEXAMETHASONE SODIUM PHOSPHATE 10 MG/ML IJ SOLN
INTRAMUSCULAR | Status: AC
  Filled 2022-07-26: qty 1

## 2022-07-26 MED ORDER — PROPOFOL 10 MG/ML IV BOLUS
INTRAVENOUS | Status: AC
  Filled 2022-07-26: qty 20

## 2022-07-26 MED ORDER — PANTOPRAZOLE 80MG IVPB - SIMPLE MED
80.0000 mg | Freq: Once | INTRAVENOUS | Status: AC
  Administered 2022-07-26: 80 mg via INTRAVENOUS
  Filled 2022-07-26: qty 80

## 2022-07-26 MED ORDER — PHENYLEPHRINE 80 MCG/ML (10ML) SYRINGE FOR IV PUSH (FOR BLOOD PRESSURE SUPPORT)
PREFILLED_SYRINGE | INTRAVENOUS | Status: AC
  Filled 2022-07-26: qty 20

## 2022-07-26 MED ORDER — PHENYLEPHRINE HCL (PRESSORS) 10 MG/ML IV SOLN
INTRAVENOUS | Status: AC
  Filled 2022-07-26: qty 1

## 2022-07-26 MED ORDER — SUCCINYLCHOLINE CHLORIDE 200 MG/10ML IV SOSY
PREFILLED_SYRINGE | INTRAVENOUS | Status: AC
  Filled 2022-07-26: qty 10

## 2022-07-26 MED ORDER — SODIUM CHLORIDE 0.9 % IV BOLUS
1000.0000 mL | Freq: Once | INTRAVENOUS | Status: AC
  Administered 2022-07-26: 1000 mL via INTRAVENOUS

## 2022-07-26 MED ORDER — IOHEXOL 350 MG/ML SOLN
100.0000 mL | Freq: Once | INTRAVENOUS | Status: AC | PRN
  Administered 2022-07-26: 100 mL via INTRAVENOUS

## 2022-07-26 MED ORDER — CHLORHEXIDINE GLUCONATE CLOTH 2 % EX PADS
6.0000 | MEDICATED_PAD | Freq: Once | CUTANEOUS | Status: AC
  Administered 2022-07-27: 6 via TOPICAL

## 2022-07-26 MED ORDER — ONDANSETRON HCL 4 MG/2ML IJ SOLN
4.0000 mg | Freq: Once | INTRAMUSCULAR | Status: AC
  Administered 2022-07-26: 4 mg via INTRAVENOUS
  Filled 2022-07-26: qty 2

## 2022-07-26 NOTE — H&P (Signed)
Tina [name] is an 42 y.o. female.   Chief Complaint: Abdominal pain HPI: 42 year old female history of gastric bypass in [place] in 2021 presents with a 1 month history of epigastric abdominal pain.  The pain became worse today and she became more dizzy.  She was brought in by EMS.  She was evaluated and found by CT scan to have free intra-abdominal air concerning for a breakdown of her gastrojejunostomy.  She has a history of a similar problem back in 2023.  She underwent exploratory laparotomy Dr. Corliss Skains with Cheree Ditto patch of her gastrojejunostomy.  She also had a procedure back in August 2023 by Dr. Clide Cliff in [place] for similar problem.  She complains of epigastric abdominal pain which is now diffuse.  She continues to smoke and drink alcohol.  She was given a prescription for Carafate in March of this year.  She has had issues with compliance with her proton pump inhibitors.  Past Medical History:  Diagnosis Date   Allergic rhinitis    Anxiety    Arthritis    In her back and legs per pt, 09/03/2020   Bipolar disorder (HCC)    Diagnosised 10 yrs ago doesn't take any meds 09/03/2020   Chronic thoracic back pain    GERD (gastroesophageal reflux disease)    History of gestational diabetes    Hyperlipidemia    Major depression    Menorrhagia    Migraine    Smokers' cough (HCC)    albuterol (PROVENTIL HFA;VENTOLIN HFA)   Wears glasses    09/03/2020    Past Surgical History:  Procedure Laterality Date   BIOPSY THYROID  06/06/2016   LEFT THYROIDECTOMY   CESAREAN SECTION  05/21/2004   CESAREAN SECTION WITH BILATERAL TUBAL LIGATION Bilateral 08/29/2014   Procedure: CESAREAN SECTION WITH BILATERAL TUBAL LIGATION;  Surgeon: Lavina Hamman, MD;  Location: [facility];  Service: Obstetrics;  Laterality: Bilateral;  MD requests RNFA [name] confirmed 7/29...tms   CHOLECYSTECTOMY N/A 03/26/2015   Procedure: LAPAROSCOPIC CHOLECYSTECTOMY;  Surgeon: De Blanch Kinsinger, MD;   Location: [facility];  Service: General;  Laterality: N/A;   DILATATION & CURETTAGE/HYSTEROSCOPY WITH MYOSURE N/A 09/05/2020   Procedure: DILATATION & CURETTAGE/HYSTEROSCOPY WITH MYOSURE;  Surgeon: Lavina Hamman, MD;  Location: [facility];  Service: Gynecology;  Laterality: N/A;   DILITATION & CURRETTAGE/HYSTROSCOPY WITH NOVASURE ABLATION N/A 01/28/2016   Procedure: DILATATION & CURETTAGE/HYSTEROSCOPY WITH NOVASURE ABLATION;  Surgeon: Lavina Hamman, MD;  Location: [facility];  Service: Gynecology;  Laterality: N/A;   GASTRIC BYPASS     done in 2021, 09/03/2020   LAPAROTOMY N/A 11/26/2021   Procedure: EXPLORATORY LAPAROTOMY, GRAHAM PATCH REPAIR OF PERFORATED ULCER;  Surgeon: Manus Rudd, MD;  Location: [facility];  Service: General;  Laterality: N/A;   LUMBAR MICRODISCECTOMY  11/17/2007   L5-  S1   THYROIDECTOMY Left 06/06/2016   Procedure: LEFT THYROIDECTOMY AND CORE BIOPSY OF RIGHT THYROID;  Surgeon: Christia Reading, MD;  Location: [facility];  Service: ENT;  Laterality: Left;   TONSILLECTOMY  2001   TUBAL LIGATION      Family History  Problem Relation Age of Onset   Depression Mother    Mental illness Mother    Diabetes Father    Hypertension Father    Hyperlipidemia Father    Diabetes Maternal Grandmother        Dialysis   Hypertension Maternal Grandmother    Heart disease Maternal Grandmother    Cancer Paternal Grandmother  Breast Cancer   Cancer Paternal Grandfather        colon   Colon cancer Paternal Grandfather    Diabetes Brother    Cancer Other        colon   Kidney failure Other        dialysis   Colon cancer Paternal Uncle    Esophageal cancer Neg Hx    Stomach cancer Neg Hx    Rectal cancer Neg Hx    Social History:  reports that she quit smoking about 6 years ago. Her smoking use included cigarettes. She started smoking about 26 years ago. She has a 10 pack-year smoking history. She has never used smokeless tobacco. She reports that  she does not drink alcohol and does not use drugs.  Allergies:  Allergies  Allergen Reactions   Tramadol Rash and Other (See Comments)    "Triggers migraines" also   Oxycodone Itching and Other (See Comments)    SEVERE itching with Percocet    Wellbutrin [Bupropion] Hives    (Not in a hospital admission)   Results for orders placed or performed during the hospital encounter of 07/26/22 (from the past 48 hour(s))  Comprehensive metabolic panel     Status: Abnormal   Collection Time: 07/26/22  7:16 PM  Result Value Ref Range   Sodium 134 (L) 135 - 145 mmol/L   Potassium 3.5 3.5 - 5.1 mmol/L   Chloride 101 98 - 111 mmol/L   CO2 28 22 - 32 mmol/L   Glucose, Bld 105 (H) 70 - 99 mg/dL    Comment: Glucose reference range applies only to samples taken after fasting for at least 8 hours.   BUN 13 6 - 20 mg/dL   Creatinine, Ser 1.91 0.44 - 1.00 mg/dL   Calcium 8.2 (L) 8.9 - 10.3 mg/dL   Total Protein 6.1 (L) 6.5 - 8.1 g/dL   Albumin 3.0 (L) 3.5 - 5.0 g/dL   AST 16 15 - 41 U/L   ALT 17 0 - 44 U/L   Alkaline Phosphatase 63 38 - 126 U/L   Total Bilirubin 0.6 0.3 - 1.2 mg/dL   GFR, Estimated >47 >82 mL/min    Comment: (NOTE) Calculated using the CKD-EPI Creatinine Equation (2021)    Anion gap 5 5 - 15    Comment: Performed at [facility], [address], [place], [place] [address]  CBC     Status: Abnormal   Collection Time: 07/26/22  7:16 PM  Result Value Ref Range   WBC 5.7 4.0 - 10.5 K/uL   RBC 5.20 (H) 3.87 - 5.11 MIL/uL   Hemoglobin 12.3 12.0 - 15.0 g/dL   HCT 30.8 65.7 - 84.6 %   MCV 78.3 (L) 80.0 - 100.0 fL   MCH 23.7 (L) 26.0 - 34.0 pg   MCHC 30.2 30.0 - 36.0 g/dL   RDW 96.2 (H) 95.2 - 84.1 %   Platelets 342 150 - 400 K/uL   nRBC 0.0 0.0 - 0.2 %    Comment: Performed at [facility], [address] [place], [place] [address]  hCG, serum, qualitative     Status: None   Collection Time: 07/26/22  7:16 PM  Result Value Ref Range    Preg, Serum NEGATIVE NEGATIVE    Comment:        THE SENSITIVITY OF THIS METHODOLOGY IS >10 mIU/mL. Performed at [facility], [address] [place], [place] [address]   Type and screen  [facility]     Status: None   Collection Time: 07/26/22  7:19 PM  Result Value Ref Range   ABO/RH(D) O POS    Antibody Screen NEG    Sample Expiration      07/29/2022,2359 Performed at [facility], [address] [place], [place] [address]   POC occult blood, ED     Status: None   Collection Time: 07/26/22  7:27 PM  Result Value Ref Range   Positive    I-stat chem 8, ED (not at [facility], [facility] or [facility])     Status: Abnormal   Collection Time: 07/26/22  7:36 PM  Result Value Ref Range   Sodium 137 135 - 145 mmol/L   Potassium 3.7 3.5 - 5.1 mmol/L   Chloride 101 98 - 111 mmol/L   BUN 13 6 - 20 mg/dL   Creatinine, Ser 6.04 0.44 - 1.00 mg/dL   Glucose, Bld 99 70 - 99 mg/dL    Comment: Glucose reference range applies only to samples taken after fasting for at least 8 hours.   Calcium, Ion 1.09 (L) 1.15 - 1.40 mmol/L   TCO2 29 22 - 32 mmol/L   Hemoglobin 13.6 12.0 - 15.0 g/dL   HCT 54.0 98.1 - 19.1 %   CT ANGIO GI BLEED  Result Date: 07/26/2022 CLINICAL DATA:  Lower gastrointestinal hemorrhage, splenic hematoma, abdominal pain, hematemesis EXAM: CTA ABDOMEN AND PELVIS WITHOUT AND WITH CONTRAST TECHNIQUE: Multidetector CT imaging of the abdomen and pelvis was performed using the standard protocol during bolus administration of intravenous contrast. Multiplanar reconstructed images and MIPs were obtained and reviewed to evaluate the vascular anatomy. RADIATION DOSE REDUCTION: This exam was performed according to the departmental dose-optimization program which includes automated exposure control, adjustment of the mA and/or kV according to patient size and/or use of iterative reconstruction technique. CONTRAST:  OMNIPAQUE IOHEXOL 350 MG/ML SOLN  COMPARISON:  05/26/2022 FINDINGS: VASCULAR Aorta: Normal caliber aorta without aneurysm, dissection, vasculitis or significant stenosis. Celiac: Patent without evidence of aneurysm, dissection, vasculitis or significant stenosis. SMA: Patent without evidence of aneurysm, dissection, vasculitis or significant stenosis. Renals: Both renal arteries are patent without evidence of aneurysm, dissection, vasculitis, fibromuscular dysplasia or significant stenosis. IMA: Patent without evidence of aneurysm, dissection, vasculitis or significant stenosis. Inflow: Patent without evidence of aneurysm, dissection, vasculitis or significant stenosis. Proximal Outflow: Bilateral common femoral and visualized portions of the superficial and profunda femoral arteries are patent without evidence of aneurysm, dissection, vasculitis or significant stenosis. Veins: Unremarkable. Specifically, the mesenteric veins, splenic vein, and portal vein are patent. Review of the MIP images confirms the above findings. NON-VASCULAR Lower chest: Visualized lung bases are clear. Cardiac size within normal limits. Hepatobiliary: Status post cholecystectomy. Liver unremarkable. No intra or extrahepatic biliary ductal dilation. Pancreas: Unremarkable. No pancreatic ductal dilatation or surrounding inflammatory changes. Spleen: Splenic subcapsular hematoma has decreased in size, measuring 3.8 x 7.2 cm at axial image # 11/18. The spleen is otherwise unremarkable. Adrenals/Urinary Tract: The adrenal glands are unremarkable. The kidneys are normal. The bladder is unremarkable. Stomach/Bowel: There is moderate free intraperitoneal gas. The exact site of bowel perforation is not clearly identified on this examination, however, there is possible communication with the proximal Roux limb just beyond the gastrojejunostomy, best seen on axial image # 23/18 and sagittal image # 85/24. 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 Moderate ascites is present. Diffuse bowel wall thickening involving the  small bowel is present, possibly reflecting changes of peritonitis. No evidence of obstruction. No active gastrointestinal hemorrhage is identified. Lymphatic: No pathologic adenopathy within the abdomen and pelvis. Reproductive: Uterus and bilateral adnexa are unremarkable. Other: There is diffuse abdominal wall subcutaneous edema, progressive since prior examination, possibly reflecting changes of anasarca. Musculoskeletal: No acute bone abnormality. No lytic or blastic bone lesion. IMPRESSION: 1. Moderate free intraperitoneal gas. The exact site of bowel perforation is not clearly identified on this examination, however, there is possible extraluminal extension of gas from the proximal Roux limb just beyond the gastrojejunostomy. 2. Moderate ascites. Diffuse bowel wall thickening involving the small bowel, possibly reflecting changes of peritonitis. 3. No active gastrointestinal hemorrhage identified. 4. Progressive abdominal wall subcutaneous edema, possibly reflecting changes of anasarca. 5. Interval decrease in size of splenic subcapsular hematoma. Electronically Signed   By: Helyn Numbers M.D.   On: 07/26/2022 22:28    Review of Systems  Gastrointestinal:  Positive for abdominal pain.  All other systems reviewed and are negative.   Blood pressure 120/80, pulse 93, temperature (!) 97.5 F (36.4 C), temperature source Axillary, resp. rate 20, height 5\' 7"  (1.702 m), weight 56.7 kg, last menstrual period 07/26/2022, SpO2 100%. Physical Exam Vitals reviewed.  Constitutional:      Appearance: She is cachectic. She is ill-appearing.  Eyes:     Pupils: Pupils are equal, round, and reactive to light.  Abdominal:     Tenderness: There is generalized abdominal tenderness. There is guarding and rebound.       Comments: Previous scar are noted.  Musculoskeletal:     Cervical back: Normal  range of motion.  Skin:    General: Skin is warm.  Neurological:     General: No focal deficit present.     Mental Status: She is alert.  Psychiatric:        Mood and Affect: Mood normal.      Assessment/Plan Peritonitis from suspected gastrojejunostomy breakdown after previous gastric bypass and noncompliance with follow-up with continue alcohol and tobacco use despite previous ulcers  Discussed surgery with the patient.  Recommend exploratory laparotomy.  Depending on findings will dictate operation but more likely is a breakdown of her GJ anastomosis again with history of melena and epigastric abdominal pain for a month as well as use of alcohol and tobacco.  Explained if she does not stop smoking and drinking, this will continue.  This could be a fatal event as well.  Explained the importance of strict follow-up.  Risk of bleeding, infection, death, DVT, open wound, breakdown, fistula, poor wound healing organ injury, the need for repeat surgery discussed.  Plan to proceed emergently to the OR tonight  Dortha Schwalbe, MD 07/26/2022, 11:37 PM   High complexity

## 2022-07-26 NOTE — ED Provider Notes (Signed)
[place] EMERGENCY DEPARTMENT AT [facility] Provider Note   CSN: [id number] Arrival date & time: 07/26/22  1858     History {Add pertinent medical, surgical, social history, OB history to HPI:1} Chief Complaint  Patient presents with   GI Bleeding    Tina [name] is a 42 y.o. female history of gastric bypass, gastrojejunal anastomosis perforation requiring emergency surgery, splenic hematoma after trauma here presenting with abdominal pain and vomiting and melena.  Patient states that she has been vomiting since yesterday.  She states that it was clear initially and became brownish in color.  She also had melanotic the stool today.  Patient also has some vaginal spotting and was noted to have some blood in the commode today.  Patient states that she has diffuse abdominal pain.  Patient was concerned because she had recent perforation as well as splenic hematoma.  Patient denies any fall or trauma  The history is provided by the patient.       Home Medications Prior to Admission medications   Medication Sig Start Date End Date Taking? Authorizing Provider  acetaminophen (TYLENOL) 650 MG CR tablet Take 650-1,300 mg by mouth every 8 (eight) hours as needed for pain (or headaches).    [provider]  omeprazole (PRILOSEC) 40 MG capsule Take 40 mg by mouth 2 (two) times daily before a meal.    [provider]  ondansetron (ZOFRAN) 8 MG tablet Take 8 mg by mouth 3 (three) times daily as needed for nausea or vomiting.    [provider]  sucralfate (CARAFATE) 1 g tablet Take 1 g by mouth 2 (two) times daily.    [provider]  varenicline (CHANTIX) 1 MG tablet Take 1 mg by mouth 2 (two) times daily. Patient not taking: Reported on 05/26/2022    [provider]      Allergies    Tramadol, Oxycodone, and Wellbutrin [bupropion]    Review of Systems   Review of Systems  Gastrointestinal:  Positive for abdominal pain and  vomiting.       Melena  All other systems reviewed and are negative.   Physical Exam Updated Vital Signs BP 120/80   Pulse 93   Temp (!) 97.5 F (36.4 C) (Axillary)   Resp 20   Ht 5\' 7"  (1.702 m)   Wt 56.7 kg   LMP 07/26/2022 Comment: neg preg test 07/26/2022  SpO2 100%   BMI 19.58 kg/m  Physical Exam Vitals and nursing note reviewed.  Constitutional:      Comments: Uncomfortable  HENT:     Head: Normocephalic.     Nose: Nose normal.     Mouth/Throat:     Mouth: Mucous membranes are moist.  Eyes:     Extraocular Movements: Extraocular movements intact.     Pupils: Pupils are equal, round, and reactive to light.  Cardiovascular:     Rate and Rhythm: Normal rate and regular rhythm.     Pulses: Normal pulses.     Heart sounds: Normal heart sounds.  Pulmonary:     Effort: Pulmonary effort is normal.     Breath sounds: Normal breath sounds.  Abdominal:     Comments: Complicated surgical scars.  Slight distention and mild diffuse tenderness  Genitourinary:    Comments: Rectal- dark stool, guiac positive Musculoskeletal:        General: Normal range of motion.     Cervical back: Normal range of motion and neck supple.  Skin:  General: Skin is warm.     Capillary Refill: Capillary refill takes less than 2 seconds.  Neurological:     General: No focal deficit present.     Mental Status: She is alert and oriented to person, place, and time.  Psychiatric:        Mood and Affect: Mood normal.        Behavior: Behavior normal.     ED Results / Procedures / Treatments   Labs (all labs ordered are listed, but only abnormal results are displayed) Labs Reviewed  COMPREHENSIVE METABOLIC PANEL - Abnormal; Notable for the following components:      Result Value   Sodium 134 (*)    Glucose, Bld 105 (*)    Calcium 8.2 (*)    Total Protein 6.1 (*)    Albumin 3.0 (*)    All other components within normal limits  CBC - Abnormal; Notable for the following components:   RBC  5.20 (*)    MCV 78.3 (*)    MCH 23.7 (*)    RDW 16.1 (*)    All other components within normal limits  I-STAT CHEM 8, ED - Abnormal; Notable for the following components:   Calcium, Ion 1.09 (*)    All other components within normal limits  HCG, SERUM, QUALITATIVE  POC OCCULT BLOOD, ED  TYPE AND SCREEN    EKG EKG Interpretation Date/Time:  Saturday July 26 2022 22:19:35 EDT Ventricular Rate:  89 PR Interval:  124 QRS Duration:  79 QT Interval:  389 QTC Calculation: 474 R Axis:   44  Text Interpretation: Sinus rhythm Borderline T abnormalities, anterior leads No significant change since last tracing Confirmed by Richardean Canal [phone]) on 07/26/2022 10:22:08 PM  Radiology CT ANGIO GI BLEED  Result Date: 07/26/2022 CLINICAL DATA:  Lower gastrointestinal hemorrhage, splenic hematoma, abdominal pain, hematemesis EXAM: CTA ABDOMEN AND PELVIS WITHOUT AND WITH CONTRAST TECHNIQUE: Multidetector CT imaging of the abdomen and pelvis was performed using the standard protocol during bolus administration of intravenous contrast. Multiplanar reconstructed images and MIPs were obtained and reviewed to evaluate the vascular anatomy. RADIATION DOSE REDUCTION: This exam was performed according to the departmental dose-optimization program which includes automated exposure control, adjustment of the mA and/or kV according to patient size and/or use of iterative reconstruction technique. CONTRAST:  OMNIPAQUE IOHEXOL 350 MG/ML SOLN COMPARISON:  05/26/2022 FINDINGS: VASCULAR Aorta: Normal caliber aorta without aneurysm, dissection, vasculitis or significant stenosis. Celiac: Patent without evidence of aneurysm, dissection, vasculitis or significant stenosis. SMA: Patent without evidence of aneurysm, dissection, vasculitis or significant stenosis. Renals: Both renal arteries are patent without evidence of aneurysm, dissection, vasculitis, fibromuscular dysplasia or significant stenosis. IMA: Patent without  evidence of aneurysm, dissection, vasculitis or significant stenosis. Inflow: Patent without evidence of aneurysm, dissection, vasculitis or significant stenosis. Proximal Outflow: Bilateral common femoral and visualized portions of the superficial and profunda femoral arteries are patent without evidence of aneurysm, dissection, vasculitis or significant stenosis. Veins: Unremarkable. Specifically, the mesenteric veins, splenic vein, and portal vein are patent. Review of the MIP images confirms the above findings. NON-VASCULAR Lower chest: Visualized lung bases are clear. Cardiac size within normal limits. Hepatobiliary: Status post cholecystectomy. Liver unremarkable. No intra or extrahepatic biliary ductal dilation. Pancreas: Unremarkable. No pancreatic ductal dilatation or surrounding inflammatory changes. Spleen: Splenic subcapsular hematoma has decreased in size, measuring 3.8 x 7.2 cm at axial image # 11/18. The spleen is otherwise unremarkable. Adrenals/Urinary Tract: The adrenal glands are unremarkable. The kidneys are  normal. The bladder is unremarkable. Stomach/Bowel: There is moderate free intraperitoneal gas. The exact site of bowel perforation is not clearly identified on this examination, however, there is possible communication with the proximal Roux limb just beyond the gastrojejunostomy, best seen on axial image # 23/18 and sagittal image # 85/24. 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 Moderate ascites is present. Diffuse bowel wall thickening involving the small bowel is present, possibly reflecting changes of peritonitis. No evidence of obstruction. No active gastrointestinal hemorrhage is identified. Lymphatic: No pathologic adenopathy within the abdomen and pelvis. Reproductive: Uterus and bilateral adnexa are unremarkable. Other: There is diffuse abdominal wall subcutaneous edema, progressive since prior examination,  possibly reflecting changes of anasarca. Musculoskeletal: No acute bone abnormality. No lytic or blastic bone lesion. IMPRESSION: 1. Moderate free intraperitoneal gas. The exact site of bowel perforation is not clearly identified on this examination, however, there is possible extraluminal extension of gas from the proximal Roux limb just beyond the gastrojejunostomy. 2. Moderate ascites. Diffuse bowel wall thickening involving the small bowel, possibly reflecting changes of peritonitis. 3. No active gastrointestinal hemorrhage identified. 4. Progressive abdominal wall subcutaneous edema, possibly reflecting changes of anasarca. 5. Interval decrease in size of splenic subcapsular hematoma. Electronically Signed   By: Helyn Numbers M.D.   On: 07/26/2022 22:28    Procedures Procedures  {Document cardiac monitor, telemetry assessment procedure when appropriate:1}  CRITICAL CARE Performed by: Richardean Canal   Total critical care time: 41 minutes  Critical care time was exclusive of separately billable procedures and treating other patients.  Critical care was necessary to treat or prevent imminent or life-threatening deterioration.  Critical care was time spent personally by me on the following activities: development of treatment plan with patient and/or surrogate as well as nursing, discussions with consultants, evaluation of patient's response to treatment, examination of patient, obtaining history from patient or surrogate, ordering and performing treatments and interventions, ordering and review of laboratory studies, ordering and review of radiographic studies, pulse oximetry and re-evaluation of patient's condition.   Medications Ordered in ED Medications  sodium chloride 0.9 % bolus 1,000 mL (0 mLs Intravenous Stopped 07/26/22 2100)  HYDROmorphone (DILAUDID) injection 1 mg (1 mg Intravenous Given 07/26/22 1943)  ondansetron (ZOFRAN) injection 4 mg (4 mg Intravenous Given 07/26/22 1939)   pantoprazole (PROTONIX) 80 mg /NS 100 mL IVPB (0 mg Intravenous Stopped 07/26/22 2133)  iohexol (OMNIPAQUE) 350 MG/ML injection 100 mL (100 mLs Intravenous Contrast Given 07/26/22 2155)  HYDROmorphone (DILAUDID) injection 1 mg (1 mg Intravenous Given 07/26/22 2217)  piperacillin-tazobactam (ZOSYN) IVPB 3.375 g (3.375 g Intravenous New Bag/Given 07/26/22 2253)  fentaNYL (SUBLIMAZE) injection 50 mcg (50 mcg Intravenous Given 07/26/22 2253)    ED Course/ Medical Decision Making/ A&P   {   Click here for ABCD2, HEART and other calculatorsREFRESH Note before signing :1}                          Medical Decision Making Monalee Thackeray is a 42 y.o. female here presenting with abdominal pain and rectal bleeding.  Patient has melanotic stool on exam.  Patient has history of gastric ulcer as well as anastomosis leak.  Patient also has history of splenic hematoma.  Concern for either a recurrent ulcer versus anastomosis leak versus diverticular bleed.  Went to get CBC and CMP and CTA abdomen pelvis  10:40 PM Reviewed patient's labs and white blood cell  count is normal.  CT showed perforation close to her duodenal jejunal anastomosis site.  Unfortunately, this is a recurrent issue and she had recent surgery in November to repair this.  I discussed case with Dr. Luisa Hart from surgery.  Problems Addressed: Bowel perforation (HCC): acute illness or injury  Amount and/or Complexity of Data Reviewed Labs: ordered. Decision-making details documented in ED Course. Radiology: ordered and independent interpretation performed. Decision-making details documented in ED Course. ECG/medicine tests: ordered and independent interpretation performed. Decision-making details documented in ED Course.  Risk Prescription drug management. Decision regarding hospitalization.    Final Clinical Impression(s) / ED Diagnoses Final diagnoses:  Bowel perforation (HCC)    Rx / DC Orders ED Discharge Orders     None

## 2022-07-26 NOTE — ED Triage Notes (Signed)
Ems Reports  patient has been brown vomit and blood in the stool last night.  Hx of ulcers and gastro bypass surgery.  Weakness

## 2022-07-27 ENCOUNTER — Emergency Department (HOSPITAL_COMMUNITY): Payer: Medicaid Other | Admitting: Certified Registered Nurse Anesthetist

## 2022-07-27 ENCOUNTER — Other Ambulatory Visit: Payer: Self-pay

## 2022-07-27 ENCOUNTER — Encounter (HOSPITAL_COMMUNITY): Admission: EM | Disposition: A | Discharge status: Home Health | Payer: Self-pay | Source: Home / Self Care

## 2022-07-27 DIAGNOSIS — M5430 Sciatica, unspecified side: Secondary | ICD-10-CM | POA: Diagnosis present

## 2022-07-27 DIAGNOSIS — F418 Other specified anxiety disorders: Secondary | ICD-10-CM

## 2022-07-27 DIAGNOSIS — Z888 Allergy status to other drugs, medicaments and biological substances status: Secondary | ICD-10-CM | POA: Diagnosis not present

## 2022-07-27 DIAGNOSIS — Z8249 Family history of ischemic heart disease and other diseases of the circulatory system: Secondary | ICD-10-CM | POA: Diagnosis not present

## 2022-07-27 DIAGNOSIS — T471X6A Underdosing of other antacids and anti-gastric-secretion drugs, initial encounter: Secondary | ICD-10-CM | POA: Diagnosis present

## 2022-07-27 DIAGNOSIS — G43909 Migraine, unspecified, not intractable, without status migrainosus: Secondary | ICD-10-CM | POA: Diagnosis present

## 2022-07-27 DIAGNOSIS — E43 Unspecified severe protein-calorie malnutrition: Secondary | ICD-10-CM | POA: Diagnosis present

## 2022-07-27 DIAGNOSIS — Z91411 Personal history of adult psychological abuse: Secondary | ICD-10-CM | POA: Diagnosis not present

## 2022-07-27 DIAGNOSIS — Z8632 Personal history of gestational diabetes: Secondary | ICD-10-CM | POA: Diagnosis not present

## 2022-07-27 DIAGNOSIS — F1020 Alcohol dependence, uncomplicated: Secondary | ICD-10-CM

## 2022-07-27 DIAGNOSIS — F17210 Nicotine dependence, cigarettes, uncomplicated: Secondary | ICD-10-CM | POA: Diagnosis present

## 2022-07-27 DIAGNOSIS — K659 Peritonitis, unspecified: Secondary | ICD-10-CM | POA: Diagnosis present

## 2022-07-27 DIAGNOSIS — Z91199 Patient's noncompliance with other medical treatment and regimen due to unspecified reason: Secondary | ICD-10-CM | POA: Diagnosis not present

## 2022-07-27 DIAGNOSIS — Z83438 Family history of other disorder of lipoprotein metabolism and other lipidemia: Secondary | ICD-10-CM | POA: Diagnosis not present

## 2022-07-27 DIAGNOSIS — G4710 Hypersomnia, unspecified: Secondary | ICD-10-CM | POA: Diagnosis not present

## 2022-07-27 DIAGNOSIS — K285 Chronic or unspecified gastrojejunal ulcer with perforation: Secondary | ICD-10-CM

## 2022-07-27 DIAGNOSIS — Z8719 Personal history of other diseases of the digestive system: Secondary | ICD-10-CM | POA: Diagnosis not present

## 2022-07-27 DIAGNOSIS — E785 Hyperlipidemia, unspecified: Secondary | ICD-10-CM

## 2022-07-27 DIAGNOSIS — Z885 Allergy status to narcotic agent status: Secondary | ICD-10-CM | POA: Diagnosis not present

## 2022-07-27 DIAGNOSIS — Z91410 Personal history of adult physical and sexual abuse: Secondary | ICD-10-CM | POA: Diagnosis not present

## 2022-07-27 DIAGNOSIS — Z5982 Transportation insecurity: Secondary | ICD-10-CM | POA: Diagnosis not present

## 2022-07-27 DIAGNOSIS — E890 Postprocedural hypothyroidism: Secondary | ICD-10-CM | POA: Diagnosis present

## 2022-07-27 DIAGNOSIS — Z91128 Patient's intentional underdosing of medication regimen for other reason: Secondary | ICD-10-CM | POA: Diagnosis not present

## 2022-07-27 DIAGNOSIS — K9589 Other complications of other bariatric procedure: Secondary | ICD-10-CM | POA: Diagnosis present

## 2022-07-27 DIAGNOSIS — F419 Anxiety disorder, unspecified: Secondary | ICD-10-CM | POA: Diagnosis present

## 2022-07-27 DIAGNOSIS — F988 Other specified behavioral and emotional disorders with onset usually occurring in childhood and adolescence: Secondary | ICD-10-CM | POA: Diagnosis present

## 2022-07-27 HISTORY — PX: LAPAROTOMY: SHX154

## 2022-07-27 LAB — HIV ANTIBODY (ROUTINE TESTING W REFLEX): HIV Screen 4th Generation wRfx: NONREACTIVE

## 2022-07-27 LAB — GLUCOSE, CAPILLARY: Glucose-Capillary: 121 mg/dL — ABNORMAL HIGH (ref 70–99)

## 2022-07-27 SURGERY — LAPAROTOMY, EXPLORATORY
Anesthesia: General | Site: Abdomen

## 2022-07-27 MED ORDER — DEXAMETHASONE SODIUM PHOSPHATE 10 MG/ML IJ SOLN
INTRAMUSCULAR | Status: DC | PRN
  Administered 2022-07-27: 8 mg via INTRAVENOUS

## 2022-07-27 MED ORDER — ACETAMINOPHEN 10 MG/ML IV SOLN
INTRAVENOUS | Status: DC | PRN
  Administered 2022-07-27: 1000 mg via INTRAVENOUS

## 2022-07-27 MED ORDER — KCL IN DEXTROSE-NACL 20-5-0.9 MEQ/L-%-% IV SOLN
INTRAVENOUS | Status: AC
  Filled 2022-07-27: qty 1000

## 2022-07-27 MED ORDER — ONDANSETRON 4 MG PO TBDP: 4.0000 mg | ORAL_TABLET | Freq: Four times a day (QID) | ORAL | Status: DC | PRN

## 2022-07-27 MED ORDER — METHOCARBAMOL 500 MG IVPB - SIMPLE MED
500.0000 mg | Freq: Three times a day (TID) | INTRAVENOUS | Status: DC | PRN
  Administered 2022-07-27: 500 mg via INTRAVENOUS
  Filled 2022-07-27: qty 500

## 2022-07-27 MED ORDER — SUCCINYLCHOLINE CHLORIDE 200 MG/10ML IV SOSY
PREFILLED_SYRINGE | INTRAVENOUS | Status: DC | PRN
  Administered 2022-07-27: 80 mg via INTRAVENOUS

## 2022-07-27 MED ORDER — 0.9 % SODIUM CHLORIDE (POUR BTL) OPTIME
TOPICAL | Status: DC | PRN
  Administered 2022-07-27: 2000 mL

## 2022-07-27 MED ORDER — TRAVASOL 10 % IV SOLN
INTRAVENOUS | Status: AC
  Filled 2022-07-27: qty 480

## 2022-07-27 MED ORDER — KCL IN DEXTROSE-NACL 20-5-0.9 MEQ/L-%-% IV SOLN
INTRAVENOUS | Status: DC
  Filled 2022-07-27 (×3): qty 1000

## 2022-07-27 MED ORDER — PIPERACILLIN-TAZOBACTAM 3.375 G IVPB
3.3750 g | Freq: Three times a day (TID) | INTRAVENOUS | Status: DC
  Administered 2022-07-27 – 2022-08-01 (×17): 3.375 g via INTRAVENOUS
  Filled 2022-07-27 (×17): qty 50

## 2022-07-27 MED ORDER — DIPHENHYDRAMINE HCL 12.5 MG/5ML PO ELIX: 12.5000 mg | ORAL_SOLUTION | Freq: Four times a day (QID) | ORAL | Status: DC | PRN

## 2022-07-27 MED ORDER — ONDANSETRON HCL 4 MG/2ML IJ SOLN: 4.0000 mg | Freq: Once | INTRAMUSCULAR | Status: DC | PRN

## 2022-07-27 MED ORDER — ROCURONIUM BROMIDE 10 MG/ML (PF) SYRINGE
PREFILLED_SYRINGE | INTRAVENOUS | Status: DC | PRN
  Administered 2022-07-27: 10 mg via INTRAVENOUS
  Administered 2022-07-27: 40 mg via INTRAVENOUS

## 2022-07-27 MED ORDER — FENTANYL CITRATE (PF) 100 MCG/2ML IJ SOLN
INTRAMUSCULAR | Status: DC | PRN
  Administered 2022-07-27: 100 ug via INTRAVENOUS
  Administered 2022-07-27: 50 ug via INTRAVENOUS

## 2022-07-27 MED ORDER — ONDANSETRON HCL 4 MG/2ML IJ SOLN
INTRAMUSCULAR | Status: DC | PRN
  Administered 2022-07-27: 4 mg via INTRAVENOUS

## 2022-07-27 MED ORDER — HYDROMORPHONE HCL 1 MG/ML IJ SOLN: 0.2500 mg | INTRAMUSCULAR | Status: DC | PRN

## 2022-07-27 MED ORDER — SODIUM CHLORIDE 0.9% FLUSH: 10.0000 mL | INTRAVENOUS | Status: DC | PRN

## 2022-07-27 MED ORDER — METHOCARBAMOL 500 MG PO TABS: 500.0000 mg | ORAL_TABLET | Freq: Three times a day (TID) | ORAL | Status: DC | PRN

## 2022-07-27 MED ORDER — INSULIN ASPART 100 UNIT/ML IJ SOLN
0.0000 [IU] | Freq: Four times a day (QID) | INTRAMUSCULAR | Status: DC
  Administered 2022-07-27 – 2022-07-30 (×5): 1 [IU] via SUBCUTANEOUS

## 2022-07-27 MED ORDER — THIAMINE HCL 100 MG/ML IJ SOLN
100.0000 mg | Freq: Every day | INTRAMUSCULAR | Status: DC
  Administered 2022-07-27 – 2022-07-31 (×4): 100 mg via INTRAVENOUS
  Filled 2022-07-27: qty 1
  Filled 2022-07-27 (×4): qty 2

## 2022-07-27 MED ORDER — SUGAMMADEX SODIUM 200 MG/2ML IV SOLN
INTRAVENOUS | Status: DC | PRN
  Administered 2022-07-27: 150 mg via INTRAVENOUS

## 2022-07-27 MED ORDER — PHENOL 1.4 % MT LIQD
1.0000 | OROMUCOSAL | Status: DC | PRN
  Filled 2022-07-27: qty 177

## 2022-07-27 MED ORDER — SODIUM CHLORIDE 0.9% FLUSH: 9.0000 mL | INTRAVENOUS | Status: DC | PRN

## 2022-07-27 MED ORDER — AMISULPRIDE (ANTIEMETIC) 5 MG/2ML IV SOLN: 10.0000 mg | Freq: Once | INTRAVENOUS | Status: DC | PRN

## 2022-07-27 MED ORDER — FOLIC ACID 1 MG PO TABS
1.0000 mg | ORAL_TABLET | Freq: Every day | ORAL | Status: DC
  Administered 2022-07-27: 1 mg
  Filled 2022-07-27 (×2): qty 1

## 2022-07-27 MED ORDER — ADULT MULTIVITAMIN W/MINERALS CH: 1.0000 | ORAL_TABLET | Freq: Every day | ORAL | Status: DC

## 2022-07-27 MED ORDER — THIAMINE MONONITRATE 100 MG PO TABS
100.0000 mg | ORAL_TABLET | Freq: Every day | ORAL | Status: DC
  Administered 2022-07-30 – 2022-08-01 (×2): 100 mg
  Filled 2022-07-27 (×3): qty 1

## 2022-07-27 MED ORDER — ACETAMINOPHEN 10 MG/ML IV SOLN
INTRAVENOUS | Status: AC
  Filled 2022-07-27: qty 100

## 2022-07-27 MED ORDER — HYDROMORPHONE 1 MG/ML IV SOLN
INTRAVENOUS | Status: DC
  Administered 2022-07-27: 0.6 mg via INTRAVENOUS
  Administered 2022-07-27: 0.3 mg via INTRAVENOUS
  Administered 2022-07-27: 1.5 mg via INTRAVENOUS
  Administered 2022-07-27: 30 mg via INTRAVENOUS
  Administered 2022-07-28: 0.3 mg via INTRAVENOUS
  Administered 2022-07-28: 1.5 mg via INTRAVENOUS
  Administered 2022-07-28: 0.3 mg via INTRAVENOUS
  Filled 2022-07-27 (×7): qty 30

## 2022-07-27 MED ORDER — LORAZEPAM 1 MG PO TABS
1.0000 mg | ORAL_TABLET | ORAL | Status: AC | PRN
  Administered 2022-07-27 – 2022-07-29 (×2): 1 mg via SUBLINGUAL
  Filled 2022-07-27 (×2): qty 1

## 2022-07-27 MED ORDER — LACTATED RINGERS IV SOLN: INTRAVENOUS | Status: DC | PRN

## 2022-07-27 MED ORDER — PROPOFOL 10 MG/ML IV BOLUS
INTRAVENOUS | Status: DC | PRN
  Administered 2022-07-27: 140 mg via INTRAVENOUS

## 2022-07-27 MED ORDER — PANTOPRAZOLE SODIUM 40 MG IV SOLR
40.0000 mg | Freq: Every day | INTRAVENOUS | Status: DC
  Administered 2022-07-27: 40 mg via INTRAVENOUS
  Filled 2022-07-27 (×2): qty 10

## 2022-07-27 MED ORDER — ONDANSETRON HCL 4 MG/2ML IJ SOLN: 4.0000 mg | Freq: Four times a day (QID) | INTRAMUSCULAR | Status: DC | PRN

## 2022-07-27 MED ORDER — METOPROLOL TARTRATE 5 MG/5ML IV SOLN: 5.0000 mg | Freq: Four times a day (QID) | INTRAVENOUS | Status: DC | PRN

## 2022-07-27 MED ORDER — ADULT MULTIVITAMIN LIQUID CH
15.0000 mL | Freq: Every day | ORAL | Status: DC
  Administered 2022-07-27: 15 mL
  Filled 2022-07-27: qty 15

## 2022-07-27 MED ORDER — ENOXAPARIN SODIUM 40 MG/0.4ML IJ SOSY
40.0000 mg | PREFILLED_SYRINGE | INTRAMUSCULAR | Status: DC
  Administered 2022-07-27 – 2022-07-31 (×5): 40 mg via SUBCUTANEOUS
  Filled 2022-07-27 (×5): qty 0.4

## 2022-07-27 MED ORDER — NALOXONE HCL 0.4 MG/ML IJ SOLN: 0.4000 mg | INTRAMUSCULAR | Status: DC | PRN

## 2022-07-27 MED ORDER — DIPHENHYDRAMINE HCL 50 MG/ML IJ SOLN: 12.5000 mg | Freq: Four times a day (QID) | INTRAMUSCULAR | Status: DC | PRN

## 2022-07-27 MED ORDER — LIDOCAINE 2% (20 MG/ML) 5 ML SYRINGE
INTRAMUSCULAR | Status: DC | PRN
  Administered 2022-07-27: 60 mg via INTRAVENOUS

## 2022-07-27 MED ORDER — MIDAZOLAM HCL 5 MG/5ML IJ SOLN
INTRAMUSCULAR | Status: DC | PRN
  Administered 2022-07-27: 2 mg via INTRAVENOUS

## 2022-07-27 SURGICAL SUPPLY — 38 items
APL SWBSTK 6 STRL LF DISP (MISCELLANEOUS) ×1
APPLICATOR COTTON TIP 6 STRL (MISCELLANEOUS) ×2 IMPLANT
APPLICATOR COTTON TIP 6IN STRL (MISCELLANEOUS) ×1 IMPLANT
BAG COUNTER SPONGE SURGICOUNT (BAG) IMPLANT
BAG SPNG CNTER NS LX DISP (BAG)
BLADE EXTENDED COATED 6.5IN (ELECTRODE) IMPLANT
BLADE HEX COATED 2.75 (ELECTRODE) ×2 IMPLANT
COVER MAYO STAND STRL (DRAPES) IMPLANT
DRAIN CHANNEL RND F F (WOUND CARE) IMPLANT
DRAPE LAPAROSCOPIC ABDOMINAL (DRAPES) ×2 IMPLANT
DRAPE WARM FLUID 44X44 (DRAPES) IMPLANT
ELECT REM PT RETURN 15FT ADLT (MISCELLANEOUS) ×2 IMPLANT
GAUZE PAD ABD 7.5X8 STRL (GAUZE/BANDAGES/DRESSINGS) IMPLANT
GAUZE SPONGE 4X4 12PLY STRL (GAUZE/BANDAGES/DRESSINGS) ×2 IMPLANT
GLOVE BIOGEL PI IND STRL 7.0 (GLOVE) ×2 IMPLANT
GLOVE ECLIPSE 8.0 STRL XLNG CF (GLOVE) ×2 IMPLANT
GLOVE INDICATOR 8.0 STRL GRN (GLOVE) ×4 IMPLANT
GOWN STRL REUS W/ TWL XL LVL3 (GOWN DISPOSABLE) ×4 IMPLANT
GOWN STRL REUS W/TWL XL LVL3 (GOWN DISPOSABLE) ×2
HANDLE SUCTION POOLE (INSTRUMENTS) IMPLANT
KIT BASIN OR (CUSTOM PROCEDURE TRAY) ×2 IMPLANT
KIT TURNOVER KIT A (KITS) IMPLANT
NS IRRIG 1000ML POUR BTL (IV SOLUTION) ×2 IMPLANT
PACK GENERAL/GYN (CUSTOM PROCEDURE TRAY) ×2 IMPLANT
STAPLER VISISTAT 35W (STAPLE) ×2 IMPLANT
SUCTION POOLE HANDLE (INSTRUMENTS) ×1
SUT ETHILON 2 0 FS 18 (SUTURE) IMPLANT
SUT PDS AB 1 CTXB1 36 (SUTURE) IMPLANT
SUT SILK 2 0 (SUTURE) ×1
SUT SILK 2-0 18XBRD TIE 12 (SUTURE) IMPLANT
SUT SILK 3 0 (SUTURE) ×1
SUT SILK 3 0 SH CR/8 (SUTURE) IMPLANT
SUT SILK 3-0 18XBRD TIE 12 (SUTURE) IMPLANT
SUT VIC AB 2-0 SH 18 (SUTURE) IMPLANT
SUT VIC AB 3-0 SH 18 (SUTURE) IMPLANT
TOWEL OR 17X26 10 PK STRL BLUE (TOWEL DISPOSABLE) ×4 IMPLANT
TRAY FOLEY MTR SLVR 16FR STAT (SET/KITS/TRAYS/PACK) IMPLANT
YANKAUER SUCT BULB TIP NO VENT (SUCTIONS) IMPLANT

## 2022-07-27 NOTE — Progress Notes (Signed)
Initial Nutrition Assessment  DOCUMENTATION CODES:      INTERVENTION:  Initiate TPN at conservative rate and titrate to gaol over 3-4 days.   Monitor electrolytes (K+, mag, phos) for signs of refeeding -MD to replete as needed    NUTRITION DIAGNOSIS:   Altered GI function related to other (see comment) (hx of gastric bypass) as evidenced by NPO status.  GOAL:   Patient will meet greater than or equal to 90% of their needs  MONITOR:   PO intake, Labs, I & O's, Weight trends, Skin  REASON FOR ASSESSMENT:   Consult New TPN/TNA  ASSESSMENT:   42 y.o. female with PMHx including GERD, HLD, anxiety, depression, bipolar disorder, and gastric bypass in 2021 who presents with 1 month of epigastric pain. Imaging showing free intra-abdominal air concerning for a breakdown of gastrojejunostomy  Per MD notes:  -patient had a similar issue in 2023, underwent ex lap with Cheree Ditto patch of gastrojejunostomy  - Tobacco and alcohol abuse, patient refuses to cease -noncompliance with PPI since started in March  S/p Exploratory laparotomy with Cheree Ditto patch closure of ulceration to the GJ anastomosis    Labs: reviewed Meds: folvite, insulin, protonix, zosyn, thiamine, D5 w/ NaCl and KCl  Wt: 144# (54%) wt loss x 6 years  07/26/22 56.7 kg  05/26/22 56.7 kg  11/26/21 59 kg  09/05/20 71.6 kg  05/27/16 114.8 kg  03/01/16 122.5 kg   I/O's: +3.48 L since admission    NUTRITION - FOCUSED PHYSICAL EXAM:  RD working remotely  Diet Order:   Diet Order             Diet NPO time specified Except for: Ice Chips  Diet effective now                   EDUCATION NEEDS:      Skin:  Skin Assessment: Skin Integrity Issues: Skin Integrity Issues:: Incisions Incisions: abdomen  Last BM:  unknown  Height:   Ht Readings from Last 1 Encounters:  07/26/22 5\' 7"  (1.702 m)    Weight:   Wt Readings from Last 1 Encounters:  07/26/22 56.7 kg    Ideal Body Weight:     BMI:   Body mass index is 19.58 kg/m.  Estimated Nutritional Needs:   Kcal:  1425-1700  Protein:  70-85 g  Fluid:  >1.7 L    Leodis Rains, RDN, LDN  Clinical Nutrition

## 2022-07-27 NOTE — Op Note (Signed)
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  She was seen last November here due to perforation of her GJ and Dr. Corliss Skains repaired that with a Graham's patch.  She continues to smoke and drink alcohol.  She has been having epigastric abdominal pain and was last seen by her surgeon in [place] in March and she was put on Carafate.  She refused to stop smoking.  She presents with sudden onset of exacerbation of her epigastric pain.  CT scan showed free air and free fluid.  She is also had melena as well.  She has not been taking her Protonix.  She presents for emergency laparotomy due to suspected ulceration at her GJ.  Risk of bleeding, infection, recurrence, fistula, death, DVT, bowel injury, organ injury, and recurrence were all reviewed.  She continues to smoke and this is hide her highest risk factor.  Description of procedure: The patient was met in the holding area and questions were answered.  She is taken back to the operating.  She was placed upon upon the operating room table.  After induction of general anesthesia, the abdomen was prepped and draped in sterile fashion timeout performed.  She already received antibiotics.  Upper midline incision was used and dissection was carried down to the midline.  Linea alba was opened the abdominal cavity entered.  There was turbid fluid noted.  The gastrojejunostomy anastomosis was just below the incision.  An NG tube was passed and I put that into the pouch.  The  ulcer was anterior was about a centimeter and then a second 3 mm ulcer was noted along the lateral wall.  The remainder of the wall was intact.  I oversewed these with 3-0 Vicryl.  Then I used a large tongue of omentum to onlay onto both and tied the sutures down it to secure the omentum and a Cheree Ditto patch like fashion over the closed ulcer.  NG tube was used to suction out the pouch.  The JJ anastomosis was intact.  The bowel was not twisted.  There is no other abnormality noted.  Stomach was fixed to the anterior abdominal wall from previous G-tubes.  Remainder of the colon and small bowel grossly normal as was the liver.  Multiple liters of irrigation were used and suctioned out.  19 round JP drain was placed to the right abdominal wall and secured the skin with 2-0 nylon.  This was overlying the anastomosis.  #1 PDS used to close fascia.  Wet-to-dry dressing applied.  All counts found to be correct.  The patient was awoke extubated taken to recovery in satisfactory condition.

## 2022-07-27 NOTE — Anesthesia Preprocedure Evaluation (Addendum)
Anesthesia Evaluation  Patient identified by MRN, date of birth, ID band Patient awake    Reviewed: Allergy & Precautions, NPO status , Patient's Chart, lab work & pertinent test results  Airway Mallampati: II  TM Distance: >3 FB Neck ROM: Full    Dental  (+) Teeth Intact, Dental Advisory Given   Pulmonary Current Smoker and Patient abstained from smoking., former smoker   Pulmonary exam normal breath sounds clear to auscultation       Cardiovascular negative cardio ROS Normal cardiovascular exam Rhythm:Regular Rate:Normal     Neuro/Psych  Headaches PSYCHIATRIC DISORDERS Anxiety Depression Bipolar Disorder      GI/Hepatic PUD,GERD  ,,(+)     substance abuse  alcohol usehistory of gastric bypass   Endo/Other  negative endocrine ROS    Renal/GU negative Renal ROS     Musculoskeletal  (+) Arthritis ,    Abdominal   Peds  Hematology negative hematology ROS (+)   Anesthesia Other Findings Day of surgery medications reviewed with the patient.  Reproductive/Obstetrics                             Anesthesia Physical Anesthesia Plan  ASA: 5 and emergent  Anesthesia Plan: General   Post-op Pain Management: Ofirmev IV (intra-op)*   Induction: Intravenous, Rapid sequence and Cricoid pressure planned  PONV Risk Score and Plan: 3 and Midazolam, Dexamethasone and Ondansetron  Airway Management Planned: Oral ETT  Additional Equipment: Arterial line  Intra-op Plan:   Post-operative Plan: Possible Post-op intubation/ventilation  Informed Consent: I have reviewed the patients History and Physical, chart, labs and discussed the procedure including the risks, benefits and alternatives for the proposed anesthesia with the patient or authorized representative who has indicated his/her understanding and acceptance.     Dental advisory given  Plan Discussed with: CRNA  Anesthesia Plan  Comments:         Anesthesia Quick Evaluation

## 2022-07-27 NOTE — Progress Notes (Signed)
PHARMACY - TOTAL PARENTERAL NUTRITION CONSULT NOTE   Indication:  Inability to tolerate enteral feeding  Patient Measurements: Height: 5\' 7"  (170.2 cm) Weight: 56.7 kg (125 lb) IBW/kg (Calculated) : 61.6 TPN AdjBW (KG): 56.7 Body mass index is 19.58 kg/m.   PMH: gastric bypass 2021, gastrojejunal anastomosis perforation requiring emergency surgery with Graham's patch 2023, splenic hematoma after trauma, +tobacco, +alcohol, noncompliance, allergic rhinitis, anxiety, arthritis, Bipolar (no meds), chronic back pain, GERD, HLD, MDD, migraines  Assessment: Admitted with GI: abdominal pain and vomiting and melena. Surgery consult: Peritonitis from suspected gastrojejunostomy breakdown after previous gastric bypass and noncompliance with follow-up with continue alcohol and tobacco use despite previous ulcers.  - 7/13 PM: Emergent ex lap  Glucose / Insulin: No h/o DM  Electrolytes: Ionized Ca 1.09 low  Renal: Scr <1  Hepatic: Baseline albumin 3 (7/13). LFT's WNL  Intake / Output; No LBM noted MIVF: D5NS + K20 at 148ml/hr  GI Imaging: 7/13: CT: free intra-abdominal air concerning for a breakdown of her gastrojejunostomy  GI Surgeries / Procedures:  7/13: OR: Exploratory laparotomy with Cheree Ditto patch closure of ulceration to the GJ anastomosis   Central access:  plan 7/14 TPN start date: 7/14  Nutritional Goals: Goal TPN rate is ____mL/hr (provides ____ g of protein and _____ kcals per day)  RD Assessment: f/u    Current Nutrition:  NPO  Plan:  Start TPN at 40 mL/hr at 1800 Electrolytes in TPN: Na 67mEq/L, K 87mEq/L, Ca 53mEq/L, Mg 75mEq/L, and Phos 13mmol/L. Cl:Ac 1:1 Add standard MVI and trace elements to TPN FA and thiamine per tube Initiate Sensitive q6h SSI and adjust as needed  Reduce D5NS + K20 to 85 mL/hr at 1800 Monitor TPN labs on Mon/Thurs, and PRN F/u RD consult   Tina [name], PharmD, BCPS Clinical Staff Pharmacist [email or web address] Merilynn [name], Keara Pagliarulo  [place] 07/27/2022,10:53 AM

## 2022-07-27 NOTE — Transfer of Care (Signed)
Immediate Anesthesia Transfer of Care Note  Patient: Tina [name]  Procedure(s) Performed: EXPLORATORY LAPAROTOMY WITH CLOSURE OF GASTROJEJUNOSTOMY MARGIN ULCER (Abdomen)  Patient Location: PACU  Anesthesia Type:General  Level of Consciousness: drowsy and patient cooperative  Airway & Oxygen Therapy: Patient Spontanous Breathing and Patient connected to face mask oxygen  Post-op Assessment: Report given to RN and Post -op Vital signs reviewed and stable  Post vital signs: Reviewed and stable  Last Vitals:  Vitals Value Taken Time  BP 135/75 07/27/22 0157  Temp 36.3 C 07/27/22 0157  Pulse 63 07/27/22 0158  Resp 13 07/27/22 0159  SpO2 100 % 07/27/22 0158  Vitals shown include unfiled device data.  Last Pain:  Vitals:   07/26/22 2252  TempSrc:   PainSc: 10-Worst pain ever         Complications: No notable events documented.

## 2022-07-27 NOTE — Anesthesia Procedure Notes (Signed)
Procedure Name: Intubation Date/Time: 07/27/2022 12:41 AM  Performed by: Epimenio Sarin, CRNAPre-anesthesia Checklist: Patient identified, Emergency Drugs available, Suction available, Patient being monitored and Timeout performed Patient Re-evaluated:Patient Re-evaluated prior to induction Oxygen Delivery Method: Circle system utilized Preoxygenation: Pre-oxygenation with 100% oxygen Induction Type: IV induction, Rapid sequence and Cricoid Pressure applied Laryngoscope Size: Mac and 3 Grade View: Grade I Tube type: Oral Tube size: 7.0 mm Number of attempts: 1 Airway Equipment and Method: Stylet Placement Confirmation: ETT inserted through vocal cords under direct vision, positive ETCO2 and breath sounds checked- equal and bilateral Secured at: 23 cm Tube secured with: Tape Dental Injury: Teeth and Oropharynx as per pre-operative assessment

## 2022-07-27 NOTE — Progress Notes (Signed)
Day of Surgery   Subjective/Chief Complaint: PAIN CONTROLLED  JP serous    Objective: Vital signs in last 24 hours: Temp:  [97.3 F (36.3 C)-98.3 F (36.8 C)] 97.3 F (36.3 C) (07/14 0919) Pulse Rate:  [59-102] 67 (07/14 0919) Resp:  [10-20] 16 (07/14 0559) BP: (113-142)/(70-94) 115/75 (07/14 0919) SpO2:  [99 %-100 %] 99 % (07/14 0919) Weight:  [56.7 kg] 56.7 kg (07/13 1907)    Intake/Output from previous day: 07/13 0701 - 07/14 0700 In: 3967.5 [I.V.:2767.5; IV Piggyback:1200] Out: 365 [Urine:100; Drains:245; Blood:20] Intake/Output this shift: Total I/O In: 0  Out: 125 [Urine:100; Emesis/NG output:25]  Incision/Wound: wound open and clean  JP serous       Lab Results:  Recent Labs    07/26/22 1916 07/26/22 1936  WBC 5.7  --   HGB 12.3 13.6  HCT 40.7 40.0  PLT 342  --    BMET Recent Labs    07/26/22 1916 07/26/22 1936  NA 134* 137  K 3.5 3.7  CL 101 101  CO2 28  --   GLUCOSE 105* 99  BUN 13 13  CREATININE 0.54 0.60  CALCIUM 8.2*  --    PT/INR No results for input(s): "LABPROT", "INR" in the last 72 hours. ABG No results for input(s): "PHART", "HCO3" in the last 72 hours.  Invalid input(s): "PCO2", "PO2"  Studies/Results: CT ANGIO GI BLEED  Result Date: 07/26/2022 CLINICAL DATA:  Lower gastrointestinal hemorrhage, splenic hematoma, abdominal pain, hematemesis EXAM: CTA ABDOMEN AND PELVIS WITHOUT AND WITH CONTRAST TECHNIQUE: Multidetector CT imaging of the abdomen and pelvis was performed using the standard protocol during bolus administration of intravenous contrast. Multiplanar reconstructed images and MIPs were obtained and reviewed to evaluate the vascular anatomy. RADIATION DOSE REDUCTION: This exam was performed according to the departmental dose-optimization program which includes automated exposure control, adjustment of the mA and/or kV according to patient size and/or use of iterative reconstruction technique. CONTRAST:  OMNIPAQUE  IOHEXOL 350 MG/ML SOLN COMPARISON:  05/26/2022 FINDINGS: VASCULAR Aorta: Normal caliber aorta without aneurysm, dissection, vasculitis or significant stenosis. Celiac: Patent without evidence of aneurysm, dissection, vasculitis or significant stenosis. SMA: Patent without evidence of aneurysm, dissection, vasculitis or significant stenosis. Renals: Both renal arteries are patent without evidence of aneurysm, dissection, vasculitis, fibromuscular dysplasia or significant stenosis. IMA: Patent without evidence of aneurysm, dissection, vasculitis or significant stenosis. Inflow: Patent without evidence of aneurysm, dissection, vasculitis or significant stenosis. Proximal Outflow: Bilateral common femoral and visualized portions of the superficial and profunda femoral arteries are patent without evidence of aneurysm, dissection, vasculitis or significant stenosis. Veins: Unremarkable. Specifically, the mesenteric veins, splenic vein, and portal vein are patent. Review of the MIP images confirms the above findings. NON-VASCULAR Lower chest: Visualized lung bases are clear. Cardiac size within normal limits. Hepatobiliary: Status post cholecystectomy. Liver unremarkable. No intra or extrahepatic biliary ductal dilation. Pancreas: Unremarkable. No pancreatic ductal dilatation or surrounding inflammatory changes. Spleen: Splenic subcapsular hematoma has decreased in size, measuring 3.8 x 7.2 cm at axial image # 11/18. The spleen is otherwise unremarkable. Adrenals/Urinary Tract: The adrenal glands are unremarkable. The kidneys are normal. The bladder is unremarkable. Stomach/Bowel: There is moderate free intraperitoneal gas. The exact site of bowel perforation is not clearly identified on this examination, however, there is possible communication with the proximal Roux limb just beyond the gastrojejunostomy, best seen on axial image # 23/18 and sagittal image # 85/24. Surgical changes of Roux-en-Y gastric bypass are again  identified with a relatively capacious  gastric pouch. Moderate ascites is present. Diffuse bowel wall thickening involving the small bowel is present, possibly reflecting changes of peritonitis. No evidence of obstruction. No active gastrointestinal hemorrhage is identified. Lymphatic: No pathologic adenopathy within the abdomen and pelvis. Reproductive: Uterus and bilateral adnexa are unremarkable. Other: There is diffuse abdominal wall subcutaneous edema, progressive since prior examination, possibly reflecting changes of anasarca. Musculoskeletal: No acute bone abnormality. No lytic or blastic bone lesion. IMPRESSION: 1. Moderate free intraperitoneal gas. The exact site of bowel perforation is not clearly identified on this examination, however, there is possible extraluminal extension of gas from the proximal Roux limb just beyond the gastrojejunostomy. 2. Moderate ascites. Diffuse bowel wall thickening involving the small bowel, possibly reflecting changes of peritonitis. 3. No active gastrointestinal hemorrhage identified. 4. Progressive abdominal wall subcutaneous edema, possibly reflecting changes of anasarca. 5. Interval decrease in size of splenic subcapsular hematoma. Electronically Signed   By: Helyn Numbers M.D.   On: 07/26/2022 22:28    Anti-infectives: Anti-infectives (From admission, onward)    Start     Dose/Rate Route Frequency Ordered Stop   07/27/22 0800  piperacillin-tazobactam (ZOSYN) IVPB 3.375 g        3.375 g 12.5 mL/hr over 240 Minutes Intravenous Every 8 hours 07/27/22 0213 08/03/22 0559   07/26/22 2245  piperacillin-tazobactam (ZOSYN) IVPB 3.375 g        3.375 g 100 mL/hr over 30 Minutes Intravenous  Once 07/26/22 2234 07/26/22 2323       Assessment/Plan: s/p Procedure(s): EXPLORATORY LAPAROTOMY WITH CLOSURE OF GASTROJEJUNOSTOMY MARGIN ULCER (N/A) Will need TNA    NPO  except ice   LOS: 0 days    Dortha Schwalbe MD  07/27/2022

## 2022-07-27 NOTE — Progress Notes (Signed)
Peripherally Inserted Central Catheter Placement  The IV Nurse has discussed with the patient and/or persons authorized to consent for the patient, the purpose of this procedure and the potential benefits and risks involved with this procedure.  The benefits include less needle sticks, lab draws from the catheter, and the patient may be discharged home with the catheter. Risks include, but not limited to, infection, bleeding, blood clot (thrombus formation), and puncture of an artery; nerve damage and irregular heartbeat and possibility to perform a PICC exchange if needed/ordered by physician.  Alternatives to this procedure were also discussed.  Bard Power PICC patient education guide, fact sheet on infection prevention and patient information card has been provided to patient /or left at bedside.    PICC Placement Documentation  PICC Double Lumen 07/27/22 Right Brachial 35 cm 0 cm (Active)  Indication for Insertion or Continuance of Line Administration of hyperosmolar/irritating solutions (i.e. TPN, Vancomycin, etc.) 07/27/22 1455  Exposed Catheter (cm) 0 cm 07/27/22 1455  Site Assessment Clean, Dry, Intact 07/27/22 1455  Lumen #1 Status Flushed;Saline locked;Blood return noted 07/27/22 1455  Lumen #2 Status Flushed;Saline locked;Blood return noted 07/27/22 1455  Dressing Type Transparent;Securing device 07/27/22 1455  Dressing Status Antimicrobial disc in place;Clean, Dry, Intact 07/27/22 1455  Safety Lock Not Applicable 07/27/22 1455  Line Care Connections checked and tightened 07/27/22 1455  Line Adjustment (NICU/IV Team Only) No 07/27/22 1455  Dressing Intervention New dressing 07/27/22 1455  Dressing Change Due 08/03/22 07/27/22 1455       Elliot Dally 07/27/2022, 2:55 PM

## 2022-07-28 ENCOUNTER — Encounter (HOSPITAL_COMMUNITY): Payer: Self-pay | Admitting: Surgery

## 2022-07-28 DIAGNOSIS — F1021 Alcohol dependence, in remission: Secondary | ICD-10-CM | POA: Insufficient documentation

## 2022-07-28 DIAGNOSIS — K285 Chronic or unspecified gastrojejunal ulcer with perforation: Secondary | ICD-10-CM

## 2022-07-28 DIAGNOSIS — F1020 Alcohol dependence, uncomplicated: Secondary | ICD-10-CM | POA: Insufficient documentation

## 2022-07-28 HISTORY — DX: Chronic or unspecified gastrojejunal ulcer with perforation: K28.5

## 2022-07-28 LAB — GLUCOSE, CAPILLARY
Glucose-Capillary: 107 mg/dL — ABNORMAL HIGH (ref 70–99)
Glucose-Capillary: 115 mg/dL — ABNORMAL HIGH (ref 70–99)
Glucose-Capillary: 119 mg/dL — ABNORMAL HIGH (ref 70–99)
Glucose-Capillary: 121 mg/dL — ABNORMAL HIGH (ref 70–99)
Glucose-Capillary: 124 mg/dL — ABNORMAL HIGH (ref 70–99)

## 2022-07-28 LAB — COMPREHENSIVE METABOLIC PANEL
ALT: 13 U/L (ref 0–44)
AST: 11 U/L — ABNORMAL LOW (ref 15–41)
Albumin: 2.1 g/dL — ABNORMAL LOW (ref 3.5–5.0)
Alkaline Phosphatase: 44 U/L (ref 38–126)
Anion gap: 5 (ref 5–15)
BUN: 11 mg/dL (ref 6–20)
CO2: 26 mmol/L (ref 22–32)
Calcium: 7.6 mg/dL — ABNORMAL LOW (ref 8.9–10.3)
Chloride: 107 mmol/L (ref 98–111)
Creatinine, Ser: 0.49 mg/dL (ref 0.44–1.00)
GFR, Estimated: >60 mL/min (ref >60)
Glucose, Bld: 129 mg/dL — ABNORMAL HIGH (ref 70–99)
Potassium: 3.9 mmol/L (ref 3.5–5.1)
Sodium: 138 mmol/L (ref 135–145)
Total Bilirubin: 0.3 mg/dL (ref 0.3–1.2)
Total Protein: 4.7 g/dL — ABNORMAL LOW (ref 6.5–8.1)

## 2022-07-28 LAB — PHOSPHORUS: Phosphorus: 2.5 mg/dL (ref 2.5–4.6)

## 2022-07-28 LAB — MAGNESIUM: Magnesium: 1.9 mg/dL (ref 1.7–2.4)

## 2022-07-28 LAB — TRIGLYCERIDES: Triglycerides: 45 mg/dL (ref <150)

## 2022-07-28 MED ORDER — ALUM & MAG HYDROXIDE-SIMETH 200-200-20 MG/5ML PO SUSP: 30.0000 mL | Freq: Four times a day (QID) | ORAL | Status: DC | PRN

## 2022-07-28 MED ORDER — LACTATED RINGERS IV BOLUS: 1000.0000 mL | Freq: Three times a day (TID) | INTRAVENOUS | Status: AC | PRN

## 2022-07-28 MED ORDER — TRAVASOL 10 % IV SOLN
INTRAVENOUS | Status: AC
  Filled 2022-07-28: qty 604.8

## 2022-07-28 MED ORDER — METHOCARBAMOL 1000 MG/10ML IJ SOLN
500.0000 mg | Freq: Three times a day (TID) | INTRAVENOUS | Status: DC
  Administered 2022-07-28 – 2022-07-30 (×6): 500 mg via INTRAVENOUS
  Filled 2022-07-28: qty 5
  Filled 2022-07-28 (×2): qty 500
  Filled 2022-07-28: qty 5
  Filled 2022-07-28 (×4): qty 500

## 2022-07-28 MED ORDER — PROCHLORPERAZINE EDISYLATE 10 MG/2ML IJ SOLN: 5.0000 mg | INTRAMUSCULAR | Status: DC | PRN

## 2022-07-28 MED ORDER — HYDROMORPHONE HCL 1 MG/ML IJ SOLN
0.5000 mg | INTRAMUSCULAR | Status: DC | PRN
  Administered 2022-07-28: 2 mg via INTRAVENOUS
  Administered 2022-07-28 – 2022-07-29 (×3): 1 mg via INTRAVENOUS
  Administered 2022-07-29: 2 mg via INTRAVENOUS
  Administered 2022-07-29 – 2022-07-31 (×12): 1 mg via INTRAVENOUS
  Filled 2022-07-28 (×11): qty 1
  Filled 2022-07-28 (×2): qty 2
  Filled 2022-07-28 (×5): qty 1

## 2022-07-28 MED ORDER — PANTOPRAZOLE SODIUM 40 MG IV SOLR
40.0000 mg | Freq: Two times a day (BID) | INTRAVENOUS | Status: DC
  Administered 2022-07-28 – 2022-07-31 (×7): 40 mg via INTRAVENOUS
  Filled 2022-07-28 (×7): qty 10

## 2022-07-28 MED ORDER — ONDANSETRON HCL 4 MG/2ML IJ SOLN
4.0000 mg | Freq: Four times a day (QID) | INTRAMUSCULAR | Status: DC | PRN
  Administered 2022-08-01: 4 mg via INTRAVENOUS
  Filled 2022-07-28: qty 2

## 2022-07-28 MED ORDER — DEXTROSE-SODIUM CHLORIDE 5-0.9 % IV SOLN: INTRAVENOUS | Status: AC

## 2022-07-28 MED ORDER — ALBUTEROL SULFATE (2.5 MG/3ML) 0.083% IN NEBU: 2.5000 mg | INHALATION_SOLUTION | Freq: Four times a day (QID) | RESPIRATORY_TRACT | Status: DC | PRN

## 2022-07-28 MED ORDER — FOLIC ACID 5 MG/ML IJ SOLN
1.0000 mg | Freq: Every day | INTRAMUSCULAR | Status: DC
  Administered 2022-07-28 – 2022-07-31 (×4): 1 mg via INTRAVENOUS
  Filled 2022-07-28 (×6): qty 0.2

## 2022-07-28 MED ORDER — MAGIC MOUTHWASH: 15.0000 mL | Freq: Four times a day (QID) | ORAL | Status: DC | PRN

## 2022-07-28 MED ORDER — SODIUM CHLORIDE 0.9 % IV SOLN
8.0000 mg | Freq: Four times a day (QID) | INTRAVENOUS | Status: DC | PRN
  Filled 2022-07-28: qty 4

## 2022-07-28 MED ORDER — PHENOL 1.4 % MT LIQD: 2.0000 | OROMUCOSAL | Status: DC | PRN

## 2022-07-28 MED ORDER — NAPHAZOLINE-GLYCERIN 0.012-0.25 % OP SOLN
1.0000 [drp] | Freq: Four times a day (QID) | OPHTHALMIC | Status: DC | PRN
  Filled 2022-07-28: qty 15

## 2022-07-28 MED ORDER — METOPROLOL TARTRATE 5 MG/5ML IV SOLN: 5.0000 mg | Freq: Four times a day (QID) | INTRAVENOUS | Status: DC | PRN

## 2022-07-28 MED ORDER — SIMETHICONE 40 MG/0.6ML PO SUSP: 80.0000 mg | Freq: Four times a day (QID) | ORAL | Status: DC | PRN

## 2022-07-28 MED ORDER — SALINE SPRAY 0.65 % NA SOLN
1.0000 | Freq: Four times a day (QID) | NASAL | Status: DC | PRN
  Filled 2022-07-28: qty 44

## 2022-07-28 MED ORDER — DIPHENHYDRAMINE HCL 50 MG/ML IJ SOLN
12.5000 mg | Freq: Four times a day (QID) | INTRAMUSCULAR | Status: DC | PRN
  Administered 2022-07-30 (×2): 25 mg via INTRAVENOUS
  Filled 2022-07-28 (×2): qty 1

## 2022-07-28 MED ORDER — ACETAMINOPHEN 650 MG RE SUPP: 650.0000 mg | Freq: Four times a day (QID) | RECTAL | Status: DC | PRN

## 2022-07-28 MED ORDER — MENTHOL 3 MG MT LOZG: 1.0000 | LOZENGE | OROMUCOSAL | Status: DC | PRN

## 2022-07-28 MED ORDER — BISACODYL 10 MG RE SUPP
10.0000 mg | Freq: Every day | RECTAL | Status: DC
  Administered 2022-07-28 – 2022-07-30 (×2): 10 mg via RECTAL
  Filled 2022-07-28 (×3): qty 1

## 2022-07-28 NOTE — Anesthesia Postprocedure Evaluation (Signed)
Anesthesia Post Note  Patient: Tina [name]  Procedure(s) Performed: EXPLORATORY LAPAROTOMY WITH CLOSURE OF GASTROJEJUNOSTOMY MARGIN ULCER (Abdomen)     Patient location during evaluation: PACU Anesthesia Type: General Level of consciousness: awake and alert Pain management: pain level controlled Vital Signs Assessment: post-procedure vital signs reviewed and stable Respiratory status: spontaneous breathing, nonlabored ventilation, respiratory function stable and patient connected to nasal cannula oxygen Cardiovascular status: blood pressure returned to baseline and stable Postop Assessment: no apparent nausea or vomiting Anesthetic complications: no   No notable events documented.  Last Vitals:  Vitals:   07/28/22 0517 07/28/22 0627  BP:  118/74  Pulse:  80  Resp: 17 18  Temp:  37 C  SpO2: 98% 98%    Last Pain:  Vitals:   07/28/22 0627  TempSrc: Oral  PainSc:                  Collene Schlichter

## 2022-07-28 NOTE — Progress Notes (Signed)
PHARMACY - TOTAL PARENTERAL NUTRITION CONSULT NOTE   Indication:  Inability to tolerate enteral feeding   Patient Measurements: Height: 5\' 7"  (170.2 cm) Weight: 56.7 kg (125 lb) IBW/kg (Calculated) : 61.6 TPN AdjBW (KG): 56.7 Body mass index is 19.58 kg/m. Usual Weight:   Assessment: Patient is a 42 y.o F with hx gastric bypass 2021 and gastrojejunal anastomosis perforation requiring emergency surgery with Graham's patch 2023 who presented to the ED on 07/26/22 with c/o abdominal pain and melena. She underwent emergent exp lap on 07/27/22 with noted "ulcer of gastrojejunostomy anastomosis from previous gastric bypass with peritonitis"  and had Cheree Ditto patch closure of ulceration to the GJ anastomosis. Pharmacy has been consulted post-op to dose TPN.  Glucose / Insulin: no hx DM - on sSSI q6h (used 2 units since TPN started on 7/14) - cbgs (goal <150): 119-129 Electrolytes: lytes wnl including CorrCa 9.12 Renal: scr <1; BUN wnL Hepatic: AST low 11, Tbili wnl Intake / Output; MIVF: D5NS with 20 mEq/K KCL @ 85 ml/hr -I/O: + 2108 mL - NG/OG: 35 mL - drain: 230 mL  GI Imaging: - 07/26/22 CT angio GI: possible extraluminal extension of gas from the proximal Roux limb just beyond the gastrojejunostomy. Moderate ascites. Diffuse bowel wall thickening involving the small bowel, possibly reflecting changes of peritonitis. No active gastrointestinal hemorrhage identified.  GI Surgeries / Procedures:  - 07/27/22: Exploratory laparotomy with Cheree Ditto patch closure of ulceration to the GJ anastomosis   Central access: PICC placed 07/27/22 TPN start date: 07/27/22  Nutritional Goals: Goal TPN rate is 80 mL/hr (provides 80 g of protein and 1613 kcals per day)  RD Assessment: Estimated Needs Total Energy Estimated Needs: 1425-1700 Total Protein Estimated Needs: 70-85 g Total Fluid Estimated Needs: >1.7 L  Current Nutrition:  - 7/14 TPN >>  - thiamine 100 mg daily ordered by CCS  - 7/15: changed  Folic acid to IV per RN's request  Plan:   At 1800:  - Increase TPN rate to 60 mL/hr - Electrolytes in TPN:  Na 36mEq/L K 92mEq/L Ca 94mEq/L Mg 14mEq/L Phos 51mmol/L Cl:Ac 1:1 - Add standard MVI and trace elements to TPN - continue sSSI q6h SSI and adjust as needed  - change IVF to D5NS - Reduce MIVF to 60 mL/hr at 1800 - Monitor TPN labs on Mon/Thurs - CMET, mag and phos daily thru 7/18  Arlester Marker, Jatavis Malek P 07/28/2022,7:02 AM

## 2022-07-28 NOTE — TOC Initial Note (Signed)
Transition of Care [facility]) - Initial/Assessment Note   Patient Details  Name: Tina [name] MRN: [id number] Date of Birth: Sep 06, 1980  Transition of [facility]) CM/SW Contact:    Ewing Schlein, LCSW Phone Number: 07/28/2022, 1:58 PM  Clinical Narrative: [facility] consulted for ETOH use resources. CSW spoke with patient and patient is agreeable to having resources added to the AVS. CSW added resources for patient to follow up with after discharge.  Expected Discharge Plan: Home/Self Care Barriers to Discharge: Continued Medical Work up  Patient Goals and CMS Choice Choice offered to / list presented to : NA  Expected Discharge Plan and Services In-house Referral: Clinical Social Work Post Acute Care Choice: NA Living arrangements for the past 2 months: Single Family Home            DME Arranged: N/A DME Agency: NA  Prior Living Arrangements/Services Living arrangements for the past 2 months: Single Family Home Patient language and need for interpreter reviewed:: Yes Do you feel safe going back to the place where you live?: Yes      Need for Family Participation in Patient Care: No (Comment) Care giver support system in place?: Yes (comment) Criminal Activity/Legal Involvement Pertinent to Current Situation/Hospitalization: No - Comment as needed  Activities of Daily Living Home Assistive Devices/Equipment: Eyeglasses ADL Screening (condition at time of admission) Patient's cognitive ability adequate to safely complete daily activities?: Yes Is the patient deaf or have difficulty hearing?: No Does the patient have difficulty seeing, even when wearing glasses/contacts?: No Does the patient have difficulty concentrating, remembering, or making decisions?: No Patient able to express need for assistance with ADLs?: Yes Does the patient have difficulty dressing or bathing?: No Independently performs ADLs?: Yes (appropriate for developmental age) Does the patient have difficulty walking or  climbing stairs?: No Weakness of Legs: None Weakness of Arms/Hands: None  Emotional Assessment Attitude/Demeanor/Rapport: Engaged Affect (typically observed): Accepting, Pleasant Orientation: : Oriented to Self, Oriented to Place, Oriented to  Time, Oriented to Situation Alcohol / Substance Use: Alcohol Use Psych Involvement: No (comment)  Admission diagnosis:  Bowel perforation (HCC) [K63.1] Peritonitis (HCC) [K65.9] Patient Active Problem List   Diagnosis Date Noted   Gastrojejunal ulcer with recurrent perforation 07/28/2022   Alcohol dependence (HCC) 07/28/2022   Peritonitis (HCC) 07/27/2022   Sciatica 06/03/2022   Splenic rupture 05/26/2022   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   Perforated ulcer (HCC) 11/26/2021   Perforated viscus 09/01/2021   Major depressive disorder with single episode, in partial remission (HCC) 03/16/2017   Iron deficiency anemia due to chronic blood loss 11/18/2016   Migraines 11/07/2016   Thyroid nodule 06/06/2016   S/P cesarean section 08/29/2014   Venereal disease, unspecified 06/23/2012   Cervicalgia 04/21/2012   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   Postlaminectomy syndrome, lumbar region 06/26/2011   Lumbago 05/09/2011   DJD (degenerative joint disease), lumbar 05/08/2011   Seasonal and perennial allergic rhinitis 05/08/2011   Sacroiliac dysfunction 04/07/2011   Lumbosacral spondylosis without myelopathy 03/10/2011   Dizziness and giddiness 11/02/2010   Preventative health care 04/26/2010   SMOKER 03/29/2010   HYPERSOMNIA 02/08/2010   HYPERLIPIDEMIA 04/26/2009   Attention deficit disorder 04/26/2009   Pruritus ani 04/26/2009   ANXIETY 02/01/2009   DEPRESSION 02/01/2009   COMMON MIGRAINE 02/01/2009   GERD 02/01/2009   CHOLELITHIASIS 02/01/2009   PCP:  Tracey Harries, MD Pharmacy:   [facility] [phone] - [place], [place] - [address] AT [facility] [address] [place] [address] [place]  [address] Phone: [phone] Fax: [phone]  Social Determinants of Health (SDOH) Social History: SDOH Screenings   Food Insecurity: Patient Declined (04/02/2022)   Received from [facility], [facility]  Housing: Patient Declined (07/28/2022)  Transportation Needs: Patient Declined (04/02/2022)   Received from [facility], [facility]  Utilities: Patient Declined (04/02/2022)   Received from [facility], [facility]  Financial Resource Strain: Patient Declined (04/02/2022)   Received from [facility], [facility]  Physical Activity: Sufficiently Active (01/13/2021)   Received from [facility], [facility]  Social Connections: Unknown (05/27/2021)   Received from [facility], [facility]  Stress: Stress Concern Present (09/01/2021)   Received from [facility], [facility]  Tobacco Use: Medium Risk (07/26/2022)   SDOH Interventions:    Readmission Risk Interventions     No data to display

## 2022-07-28 NOTE — Progress Notes (Signed)
07/28/2022  Tina [name] [id number] 1980-11-17  CARE TEAM: PCP: Tracey Harries, MD  Outpatient Care Team: Patient Care Team: Tracey Harries, MD as PCP - General (Family Medicine) Dasher, Fayrene Fearing, MD (Surgery)  Inpatient Treatment Team: Treatment Team:  [place], [place], MD Kelby Aline, RN Lucia Gaskins, [place] Riki Sheer, RN Misty Stanley, [place] Hanley [place], RN Ewing Schlein, LCSW   Problem List:   Principal Problem:   Gastrojejunal ulcer with recurrent perforation Active Problems:   History of Roux-en-Y gastric bypass   ANXIETY   Attention deficit disorder   Peritonitis (HCC)   Migraines   Sciatica   Perforated viscus   07/27/2022  Preoperative diagnosis: Perforated viscus with free air   Postoperative diagnosis: Ulcer of gastrojejunostomy anastomosis from previous gastric bypass with peritonitis   Procedure: Exploratory laparotomy with Cheree Ditto patch closure of ulceration to the GJ anastomosis   Surgeon: Harriette Bouillon, MD      Assessment [facility] Stay = 1 days) 1 Day Post-Op    Recurrent gastrojejunal marginal ulcer from prior gastric bypass status post repair emergently    Plan:  IV antibiotics.  Agree with piperacillin/tazobactam x 5 days postop.  NG tube internal drainage.  Blake external drainage.  Aggressive control with IV PPIs.  Pain control.  Scheduled methocarbamol with as needed rectal Tylenol.  On PCA for now.  See if we can wean off eventually.  Needs follow-up with bariatric surgeon.  I think there is been an issue of compliance with medications and follow-up.  Needs to quit smoking to avoid recurrent ulcers.  Needs to eliminate any alcohol ingestion to prevent recurrent ulcers.  VTE prophylaxis- SCDs, etc  mobilize as tolerated to help recovery  Disposition:  Disposition:  The patient is from: Home Anticipate discharge to:   ?? Anticipated Date of Discharge is:  July 23,2024   Barriers to discharge:  Consultant  clearance & sign off  , Social/Financial Barriers, and Pending Clinical improvement (more likely than not)  Patient currently is NOT MEDICALLY STABLE for discharge from the hospital from a surgery standpoint.      I reviewed nursing notes, last 24 h vitals and pain scores, last 48 h intake and output, last 24 h labs and trends, and last 24 h imaging results.  I have reviewed this patient's available data, including medical history, events of note, test results, etc as part of my evaluation.   A significant portion of that time was spent in counseling. Care during the described time interval was provided by me.  This care required moderate level of medical decision making.  07/28/2022    Subjective: (Chief complaint)  Patient with fair amount of pain.  Using PCA.  No hemodynamic events.  Objective:  Vital signs:  Vitals:   07/28/22 0517 07/28/22 0627 07/28/22 0732 07/28/22 0915  BP:  118/74  (!) 131/90  Pulse:  80  85  Resp: 17 18 13 15   Temp:  98.6 F (37 C)  98.1 F (36.7 C)  TempSrc:  Oral  Oral  SpO2: 98% 98% 97% 100%  Weight:      Height:           Intake/Output   Yesterday:  07/14 0701 - 07/15 0700 In: 3348.6 [I.V.:3136; IV Piggyback:212.6] Out: 1240 [Urine:975; Emesis/NG output:35; Drains:230] This shift:  Total I/O In: 0  Out: 200 [Urine:100; Emesis/NG output:50; Drains:50]  Bowel function:  Flatus: No  BM:  No  Drain: Serosanguinous   Physical Exam:  General:  Pt awake/alert in no acute distress Eyes: PERRL, normal EOM.  Sclera clear.  No icterus Neuro: CN II-XII intact w/o focal sensory/motor deficits. Lymph: No head/neck/groin lymphadenopathy Psych:  No delerium/psychosis/paranoia.  Oriented x 4 HENT: Normocephalic, Mucus membranes moist.  No thrush Neck: Supple, No tracheal deviation.  No obvious thyromegaly Chest: No pain to chest wall compression.  Good respiratory excursion.  No audible wheezing CV:  Pulses intact.  Regular rhythm.   No major extremity edema MS: Normal AROM mjr joints.  No obvious deformity  Abdomen: Soft.  Mildy distended.  Tenderness at upper abdomen midline with focal peritonitis .  No incarcerated hernias.  Ext:  No deformity.  No mjr edema.  No cyanosis Skin: No petechiae / purpurea.  No major sores.  Warm and dry    Results:   Cultures: No results found for this or any previous visit (from the past 720 hour(s)).  Labs: Results for orders placed or performed during the hospital encounter of 07/26/22 (from the past 48 hour(s))  Comprehensive metabolic panel     Status: Abnormal   Collection Time: 07/26/22  7:16 PM  Result Value Ref Range   Sodium 134 (L) 135 - 145 mmol/L   Potassium 3.5 3.5 - 5.1 mmol/L   Chloride 101 98 - 111 mmol/L   CO2 28 22 - 32 mmol/L   Glucose, Bld 105 (H) 70 - 99 mg/dL    Comment: Glucose reference range applies only to samples taken after fasting for at least 8 hours.   BUN 13 6 - 20 mg/dL   Creatinine, Ser 2.13 0.44 - 1.00 mg/dL   Calcium 8.2 (L) 8.9 - 10.3 mg/dL   Total Protein 6.1 (L) 6.5 - 8.1 g/dL   Albumin 3.0 (L) 3.5 - 5.0 g/dL   AST 16 15 - 41 U/L   ALT 17 0 - 44 U/L   Alkaline Phosphatase 63 38 - 126 U/L   Total Bilirubin 0.6 0.3 - 1.2 mg/dL   GFR, Estimated >08 >65 mL/min    Comment: (NOTE) Calculated using the CKD-EPI Creatinine Equation (2021)    Anion gap 5 5 - 15    Comment: Performed at [facility], [address] [place], [place] [address]  CBC     Status: Abnormal   Collection Time: 07/26/22  7:16 PM  Result Value Ref Range   WBC 5.7 4.0 - 10.5 K/uL   RBC 5.20 (H) 3.87 - 5.11 MIL/uL   Hemoglobin 12.3 12.0 - 15.0 g/dL   HCT 62.9 52.8 - 41.3 %   MCV 78.3 (L) 80.0 - 100.0 fL   MCH 23.7 (L) 26.0 - 34.0 pg   MCHC 30.2 30.0 - 36.0 g/dL   RDW 24.4 (H) 01.0 - 27.2 %   Platelets 342 150 - 400 K/uL   nRBC 0.0 0.0 - 0.2 %    Comment: Performed at [facility], [address] [place], [place]  [address]  hCG, serum, qualitative     Status: None   Collection Time: 07/26/22  7:16 PM  Result Value Ref Range   Preg, Serum NEGATIVE NEGATIVE    Comment:        THE SENSITIVITY OF THIS METHODOLOGY IS >10 mIU/mL. Performed at [facility], [address], [place], [place] [address]   Type and screen [facility]     Status: None   Collection Time: 07/26/22  7:19 PM  Result Value Ref Range   ABO/RH(D) [place]  POS    Antibody Screen NEG    Sample Expiration      07/29/2022,2359 Performed at [facility], [address] [place], [place] [address]   POC occult blood, ED     Status: None   Collection Time: 07/26/22  7:27 PM  Result Value Ref Range   Positive    I-stat chem 8, ED (not at [facility], [facility] or [facility])     Status: Abnormal   Collection Time: 07/26/22  7:36 PM  Result Value Ref Range   Sodium 137 135 - 145 mmol/L   Potassium 3.7 3.5 - 5.1 mmol/L   Chloride 101 98 - 111 mmol/L   BUN 13 6 - 20 mg/dL   Creatinine, Ser 6.04 0.44 - 1.00 mg/dL   Glucose, Bld 99 70 - 99 mg/dL    Comment: Glucose reference range applies only to samples taken after fasting for at least 8 hours.   Calcium, Ion 1.09 (L) 1.15 - 1.40 mmol/L   TCO2 29 22 - 32 mmol/L   Hemoglobin 13.6 12.0 - 15.0 g/dL   HCT 54.0 98.1 - 19.1 %  HIV Antibody (routine testing w rflx)     Status: None   Collection Time: 07/27/22  4:33 AM  Result Value Ref Range   HIV Screen 4th Generation wRfx Non Reactive Non Reactive    Comment: Performed at [facility], [address], [place], [place] [address]  Glucose, capillary     Status: Abnormal   Collection Time: 07/27/22  5:24 PM  Result Value Ref Range   Glucose-Capillary 121 (H) 70 - 99 mg/dL    Comment: Glucose reference range applies only to samples taken after fasting for at least 8 hours.  Glucose, capillary     Status: Abnormal   Collection Time: 07/28/22 12:20 AM  Result Value Ref Range   Glucose-Capillary 121 (H)  70 - 99 mg/dL    Comment: Glucose reference range applies only to samples taken after fasting for at least 8 hours.  Comprehensive metabolic panel     Status: Abnormal   Collection Time: 07/28/22  2:17 AM  Result Value Ref Range   Sodium 138 135 - 145 mmol/L   Potassium 3.9 3.5 - 5.1 mmol/L   Chloride 107 98 - 111 mmol/L   CO2 26 22 - 32 mmol/L   Glucose, Bld 129 (H) 70 - 99 mg/dL    Comment: Glucose reference range applies only to samples taken after fasting for at least 8 hours.   BUN 11 6 - 20 mg/dL   Creatinine, Ser 5.62 0.44 - 1.00 mg/dL   Calcium 7.6 (L) 8.9 - 10.3 mg/dL   Total Protein 4.7 (L) 6.5 - 8.1 g/dL   Albumin 2.1 (L) 3.5 - 5.0 g/dL   AST 11 (L) 15 - 41 U/L   ALT 13 0 - 44 U/L   Alkaline Phosphatase 44 38 - 126 U/L   Total Bilirubin 0.3 0.3 - 1.2 mg/dL   GFR, Estimated >13 >08 mL/min    Comment: (NOTE) Calculated using the CKD-EPI Creatinine Equation (2021)    Anion gap 5 5 - 15    Comment: Performed at [facility], [address], [place], [place] [address]  Magnesium     Status: None   Collection Time: 07/28/22  2:17 AM  Result Value Ref Range   Magnesium 1.9 1.7 - 2.4 mg/dL    Comment: Performed at [facility], [address]  [place], [place] [address]  Phosphorus     Status: None   Collection Time: 07/28/22  2:17 AM  Result Value Ref Range   Phosphorus 2.5 2.5 - 4.6 mg/dL    Comment: Performed at [facility], [address] [place], [place] [address]  Triglycerides     Status: None   Collection Time: 07/28/22  2:17 AM  Result Value Ref Range   Triglycerides 45 <150 mg/dL    Comment: Performed at [facility], [address] [place], [place] [address]  Glucose, capillary     Status: Abnormal   Collection Time: 07/28/22  6:22 AM  Result Value Ref Range   Glucose-Capillary 119 (H) 70 - 99 mg/dL    Comment: Glucose reference range applies only to samples taken after fasting  for at least 8 hours.    Imaging / Studies: [place] EKG SITE RITE  Result Date: 07/27/2022 If Site Rite image not attached, placement could not be confirmed due to current cardiac rhythm.  CT ANGIO GI BLEED  Result Date: 07/26/2022 CLINICAL DATA:  Lower gastrointestinal hemorrhage, splenic hematoma, abdominal pain, hematemesis EXAM: CTA ABDOMEN AND PELVIS WITHOUT AND WITH CONTRAST TECHNIQUE: Multidetector CT imaging of the abdomen and pelvis was performed using the standard protocol during bolus administration of intravenous contrast. Multiplanar reconstructed images and MIPs were obtained and reviewed to evaluate the vascular anatomy. RADIATION DOSE REDUCTION: This exam was performed according to the departmental dose-optimization program which includes automated exposure control, adjustment of the mA and/or kV according to patient size and/or use of iterative reconstruction technique. CONTRAST:  OMNIPAQUE IOHEXOL 350 MG/ML SOLN COMPARISON:  05/26/2022 FINDINGS: VASCULAR Aorta: Normal caliber aorta without aneurysm, dissection, vasculitis or significant stenosis. Celiac: Patent without evidence of aneurysm, dissection, vasculitis or significant stenosis. SMA: Patent without evidence of aneurysm, dissection, vasculitis or significant stenosis. Renals: Both renal arteries are patent without evidence of aneurysm, dissection, vasculitis, fibromuscular dysplasia or significant stenosis. IMA: Patent without evidence of aneurysm, dissection, vasculitis or significant stenosis. Inflow: Patent without evidence of aneurysm, dissection, vasculitis or significant stenosis. Proximal Outflow: Bilateral common femoral and visualized portions of the superficial and profunda femoral arteries are patent without evidence of aneurysm, dissection, vasculitis or significant stenosis. Veins: Unremarkable. Specifically, the mesenteric veins, splenic vein, and portal vein are patent. Review of the MIP images confirms the above  findings. NON-VASCULAR Lower chest: Visualized lung bases are clear. Cardiac size within normal limits. Hepatobiliary: Status post cholecystectomy. Liver unremarkable. No intra or extrahepatic biliary ductal dilation. Pancreas: Unremarkable. No pancreatic ductal dilatation or surrounding inflammatory changes. Spleen: Splenic subcapsular hematoma has decreased in size, measuring 3.8 x 7.2 cm at axial image # 11/18. The spleen is otherwise unremarkable. Adrenals/Urinary Tract: The adrenal glands are unremarkable. The kidneys are normal. The bladder is unremarkable. Stomach/Bowel: There is moderate free intraperitoneal gas. The exact site of bowel perforation is not clearly identified on this examination, however, there is possible communication with the proximal Roux limb just beyond the gastrojejunostomy, best seen on axial image # 23/18 and sagittal image # 85/24. 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 Moderate ascites is present. Diffuse bowel wall thickening involving the small bowel is present, possibly reflecting changes of peritonitis. No evidence of obstruction. No active gastrointestinal hemorrhage is identified. Lymphatic: No pathologic adenopathy within the abdomen and pelvis. Reproductive: Uterus and bilateral adnexa are unremarkable. Other: There is diffuse abdominal wall subcutaneous edema, progressive since prior examination, possibly reflecting  changes of anasarca. Musculoskeletal: No acute bone abnormality. No lytic or blastic bone lesion. IMPRESSION: 1. Moderate free intraperitoneal gas. The exact site of bowel perforation is not clearly identified on this examination, however, there is possible extraluminal extension of gas from the proximal Roux limb just beyond the gastrojejunostomy. 2. Moderate ascites. Diffuse bowel wall thickening involving the small bowel, possibly reflecting changes of peritonitis. 3. No active  gastrointestinal hemorrhage identified. 4. Progressive abdominal wall subcutaneous edema, possibly reflecting changes of anasarca. 5. Interval decrease in size of splenic subcapsular hematoma. Electronically Signed   By: Helyn Numbers M.D.   On: 07/26/2022 22:28    Medications / Allergies: per chart  Antibiotics: Anti-infectives (From admission, onward)    Start     Dose/Rate Route Frequency Ordered Stop   07/27/22 0800  piperacillin-tazobactam (ZOSYN) IVPB 3.375 g        3.375 g 12.5 mL/hr over 240 Minutes Intravenous Every 8 hours 07/27/22 0213 08/03/22 0559   07/26/22 2245  piperacillin-tazobactam (ZOSYN) IVPB 3.375 g        3.375 g 100 mL/hr over 30 Minutes Intravenous  Once 07/26/22 2234 07/26/22 2323         Note: Portions of this report may have been transcribed using voice recognition software. Every effort was made to ensure accuracy; however, inadvertent computerized transcription errors may be present.   Any transcriptional errors that result from this process are unintentional.    Ardeth Sportsman, MD, FACS, MASCRS Esophageal, Gastrointestinal & Colorectal Surgery Robotic and Minimally Invasive Surgery  [facility] [address] [place], [place] [address] [phone] Fax [phone] Main  CONTACT INFORMATION:  Weekday (9AM-5PM): Call [facility] main office at [phone]  Weeknight (5PM-9AM) or Weekend/Holiday: Check [email or web address] (password " [email or web address]") for General Surgery [facility] coverage  (Please, do not use SecureChat as it is not reliable communication to reach operating surgeons for immediate patient care given surgeries/outpatient duties/clinic/cross-coverage/off post-call which would lead to a delay in care.  Epic staff messaging available for outptient concerns, but may not be answered for 48 hours or more).     07/28/2022  10:01 AM

## 2022-07-28 NOTE — Progress Notes (Signed)
Initial Nutrition Assessment  DOCUMENTATION CODES:   Severe malnutrition in context of social or environmental circumstances  INTERVENTION:  Continue advancing TPN to goal rate  Monitor electrolytes (K+, mag, phos) for signs of refeeding -MD to replete as needed   Education on TPN and adequate nutrition   NUTRITION DIAGNOSIS:   Severe Malnutrition related to altered GI function, social / environmental circumstances as evidenced by severe muscle depletion, severe fat depletion.  GOAL:   Patient will meet greater than or equal to 90% of their needs  MONITOR:   PO intake, Labs, I & O's, Weight trends, Skin  REASON FOR ASSESSMENT:   Consult New TPN/TNA  ASSESSMENT:   42 y.o. female with PMHx including GERD, HLD, anxiety, depression, bipolar disorder, and gastric bypass in 2021 who presents with 1 month of epigastric pain. Imaging showing free intra-abdominal air concerning for a breakdown of gastrojejunostomy  Visited patient at bedside today to obtain nutrition hx and physical exam today  Patient reports very little to no po intake for weeks PTA. She reports as her symptoms got worse, she could not even tolerate drinking liquids.   She reports UBW ~115-120 and now she is 106#   NG tube to LIWS noted with dark brown output  Nutrition Focused Physical Exam (NFPE)  Flowsheet Row Most Recent Value  Orbital Region Severe depletion  Upper Arm Region Moderate depletion  Thoracic and Lumbar Region Unable to assess  Buccal Region Severe depletion  Temple Region Moderate depletion  Clavicle Bone Region Severe depletion  Clavicle and Acromion Bone Region Severe depletion  Scapular Bone Region Unable to assess  Dorsal Hand Unable to assess  [edema]  Patellar Region Severe depletion  Anterior Thigh Region Severe depletion  Posterior Calf Region Severe depletion  Hair Reviewed  Eyes Reviewed  Mouth Reviewed  Skin Reviewed  Nails Reviewed        NUTRITION - FOCUSED  PHYSICAL EXAM:  RD working remotely  Diet Order:   Diet Order             Diet NPO time specified Except for: Ice Chips  Diet effective now                   EDUCATION NEEDS:   Education needs have been addressed  Skin:  Skin Assessment: Skin Integrity Issues: Skin Integrity Issues:: Incisions Incisions: abdomen  Last BM:  7/15 type 7  Height:   Ht Readings from Last 1 Encounters:  07/26/22 5\' 7"  (1.702 m)    Weight:   Wt Readings from Last 1 Encounters:  07/26/22 56.7 kg    Ideal Body Weight:     BMI:  Body mass index is 19.58 kg/m.  Estimated Nutritional Needs:   Kcal:  1425-1700  Protein:  70-85 g  Fluid:  >1.7 L    Leodis Rains, RDN, LDN  Clinical Nutrition

## 2022-07-29 DIAGNOSIS — E43 Unspecified severe protein-calorie malnutrition: Secondary | ICD-10-CM

## 2022-07-29 HISTORY — DX: Unspecified severe protein-calorie malnutrition: E43

## 2022-07-29 LAB — GLUCOSE, CAPILLARY
Glucose-Capillary: 102 mg/dL — ABNORMAL HIGH (ref 70–99)
Glucose-Capillary: 118 mg/dL — ABNORMAL HIGH (ref 70–99)
Glucose-Capillary: 121 mg/dL — ABNORMAL HIGH (ref 70–99)
Glucose-Capillary: 90 mg/dL (ref 70–99)

## 2022-07-29 LAB — COMPREHENSIVE METABOLIC PANEL
ALT: 11 U/L (ref 0–44)
AST: 10 U/L — ABNORMAL LOW (ref 15–41)
Albumin: 2.1 g/dL — ABNORMAL LOW (ref 3.5–5.0)
Alkaline Phosphatase: 43 U/L (ref 38–126)
Anion gap: 4 — ABNORMAL LOW (ref 5–15)
BUN: 9 mg/dL (ref 6–20)
CO2: 26 mmol/L (ref 22–32)
Calcium: 7.3 mg/dL — ABNORMAL LOW (ref 8.9–10.3)
Chloride: 104 mmol/L (ref 98–111)
Creatinine, Ser: 0.45 mg/dL (ref 0.44–1.00)
GFR, Estimated: >60 mL/min (ref >60)
Glucose, Bld: 128 mg/dL — ABNORMAL HIGH (ref 70–99)
Potassium: 3.6 mmol/L (ref 3.5–5.1)
Sodium: 134 mmol/L — ABNORMAL LOW (ref 135–145)
Total Bilirubin: 0.6 mg/dL (ref 0.3–1.2)
Total Protein: 4.9 g/dL — ABNORMAL LOW (ref 6.5–8.1)

## 2022-07-29 LAB — MAGNESIUM: Magnesium: 1.8 mg/dL (ref 1.7–2.4)

## 2022-07-29 LAB — PHOSPHORUS: Phosphorus: 3.2 mg/dL (ref 2.5–4.6)

## 2022-07-29 MED ORDER — POTASSIUM CHLORIDE 10 MEQ/100ML IV SOLN
10.0000 meq | INTRAVENOUS | Status: AC
  Administered 2022-07-29 (×2): 10 meq via INTRAVENOUS
  Filled 2022-07-29: qty 100

## 2022-07-29 MED ORDER — DEXTROSE-SODIUM CHLORIDE 5-0.9 % IV SOLN: INTRAVENOUS | Status: DC

## 2022-07-29 MED ORDER — TRAVASOL 10 % IV SOLN
INTRAVENOUS | Status: AC
  Filled 2022-07-29: qty 806.4

## 2022-07-29 NOTE — Progress Notes (Signed)
PHARMACY - TOTAL PARENTERAL NUTRITION CONSULT NOTE   Indication:  Inability to tolerate enteral feeding   Patient Measurements: Height: 5\' 7"  (170.2 cm) Weight: 57.4 kg (126 lb 8 oz) IBW/kg (Calculated) : 61.6 TPN AdjBW (KG): 56.7 Body mass index is 19.81 kg/m. Usual Weight:   Assessment: Patient is a 41 y.o F with hx gastric bypass 2021 and gastrojejunal anastomosis perforation requiring emergency surgery with Graham's patch 2023 who presented to the ED on 07/26/22 with c/o abdominal pain and melena. She underwent emergent exp lap on 07/27/22 with noted "ulcer of gastrojejunostomy anastomosis from previous gastric bypass with peritonitis"  and had Cheree Ditto patch closure of ulceration to the GJ anastomosis. Pharmacy has been consulted post-op to dose TPN.  Glucose / Insulin: no hx DM - on sSSI q6h (used 1 unit since TPN rate increased 7/15) - cbgs (goal <150): 107-128 Electrolytes: lytes wnl including CorrCa 8.8 Renal: scr <1; BUN wnL Hepatic: AST low 10, Tbili wnl Intake / Output; MIVF: D5NS @ 60 ml/hr (confirmed with RN that D5NS + Kcl was discontinued and patient has D5NS running) - I/O: + 1730 mL - NG/OG: 150 mL - drain: 230 mL  GI Imaging: - 07/26/22 CT angio GI: possible extraluminal extension of gas from the proximal Roux limb just beyond the gastrojejunostomy. Moderate ascites. Diffuse bowel wall thickening involving the small bowel, possibly reflecting changes of peritonitis. No active gastrointestinal hemorrhage identified.  GI Surgeries / Procedures:  - 07/27/22: Exploratory laparotomy with Cheree Ditto patch closure of ulceration to the GJ anastomosis   Central access: PICC placed 07/27/22 TPN start date: 07/27/22  Nutritional Goals: Goal TPN rate is 80 mL/hr (provides 80 g of protein and 1613 kcals per day)  RD Assessment: Estimated Needs Total Energy Estimated Needs: 1425-1700 Total Protein Estimated Needs: 70-85 g Total Fluid Estimated Needs: >1.7 L  Current Nutrition:  -  7/14 TPN >>  - thiamine 100 mg daily ordered by [facility]  - 7/15: changed Folic acid to IV per RN's request  Plan:  Now: - Give Kcl IV x2 runs   At 1800:  - Increase TPN to goal rate of 80 mL/hr - Electrolytes in TPN:  Na 38mEq/L K 97mEq/L Ca 33mEq/L Mg 32mEq/L Phos 55mmol/L Cl:Ac 1:1 - Add standard MVI and trace elements to TPN - Continue sSSI q6h SSI and adjust as needed  - Decrease D5NS to 20ml/hr - Monitor TPN labs on Mon/Thurs - CMET, mag and phos daily thru 7/18  [facility] 07/29/2022,7:37 AM

## 2022-07-29 NOTE — Progress Notes (Signed)
07/29/2022  Tina [name] [id number] June 17, 1980  CARE TEAM: PCP: Tracey Harries, MD  Outpatient Care Team: Patient Care Team: Tracey Harries, MD as PCP - General (Family Medicine) Dasher, Fayrene Fearing, MD (Surgery)  Inpatient Treatment Team: Treatment Team:  [place], [place], MD Hanley Hays, RN Kelby Aline, RN Lucia Gaskins, [place] Riki Sheer, RN [place], [place], NT Ewing Schlein, [place]   Problem List:   Principal Problem:   Gastrojejunal ulcer with recurrent perforation Active Problems:   History of Roux-en-Y gastric bypass   ANXIETY   Attention deficit disorder   SMOKER   Peritonitis (HCC)   Migraines   Sciatica   Perforated viscus   Alcohol dependence (HCC)   Protein-calorie malnutrition, severe   07/27/2022  Preoperative diagnosis: Perforated viscus with free air   Postoperative diagnosis: Ulcer of gastrojejunostomy anastomosis from previous gastric bypass with peritonitis   Procedure: Exploratory laparotomy with Cheree Ditto patch closure of ulceration to the GJ anastomosis   Surgeon: Harriette Bouillon, MD      Assessment [facility] Stay = 2 days) 2 Days Post-Op    Recurrent gastrojejunal marginal ulcer from prior gastric bypass status post repair emergently    Plan:  IV antibiotics.  Agree with piperacillin/tazobactam x 5 days postop.  NG tube internal drainage.  Blake external drainage.  Aggressive control with IV PPIs.  Once taking p.o., switch to PPI twice daily and Carafate 4 times daily.  Pain control.  Scheduled methocarbamol with as needed rectal Tylenol.  On PCA for now.  See if we can wean off eventually.  If continues to including improved, plan esophagram postop day #3 = 7/17.  If no leak, remove NG tube and start on bariatric clear liquids.  Needs follow-up with bariatric surgeon.  I think there is been an issue of compliance with medications and follow-up.  Reviewed films with [facility] bariatric surgeons, Drs Andrey Campanile & Stecchshulte.  They are  concerned that with the longsleeve gastric pouch, ulcers will persist and may need revision but will defer to the operating bariatric surgeon  Needs to quit smoking to avoid recurrent ulcers.  STOP SMOKING! We talked to the patient about the dangers of smoking.  We stressed that tobacco use dramatically increases the risk of peri-operative complications such as infection, tissue necrosis leaving to problems with incision/wound and organ healing, hernia, chronic pain, heart attack, stroke, DVT, pulmonary embolism, and death.  We noted there are programs in our community to help stop smoking.  Information was available.  Needs to eliminate any alcohol ingestion to prevent recurrent ulcers.  VTE prophylaxis- SCDs, etc  mobilize as tolerated to help recovery  Disposition:  Disposition:  The patient is from: Home Anticipate discharge to:   ?? Anticipated Date of Discharge is:  July 23,2024   Barriers to discharge:  Consultant clearance & sign off  , Social/Financial Barriers, and Pending Clinical improvement (more likely than not)  Patient currently is NOT MEDICALLY STABLE for discharge from the hospital from a surgery standpoint.      I reviewed nursing notes, last 24 h vitals and pain scores, last 48 h intake and output, last 24 h labs and trends, and last 24 h imaging results.  I have reviewed this patient's available data, including medical history, events of note, test results, etc as part of my evaluation.   A significant portion of that time was spent in counseling. Care during the described time interval was provided by me.  This care required moderate level of medical decision  making.  07/29/2022    Subjective: (Chief complaint)  Patient feeling better.  Pain less.  Walking more.  Thinks she is starting to pass some gas.  No bowel movements.  Objective:  Vital signs:  Vitals:   07/28/22 1946 07/28/22 2113 07/29/22 0449 07/29/22 0500  BP: 123/85 110/80 117/76    Pulse: 84 70 63   Resp: 18 18 18    Temp: 97.6 F (36.4 C) 98.7 F (37.1 C) 98 F (36.7 C)   TempSrc: Oral Oral Oral   SpO2: 100% 100% 100%   Weight:    57.4 kg  Height:        Last BM Date : 07/28/22  Intake/Output   Yesterday:  07/15 0701 - 07/16 0700 In: 2592 [P.O.:30; I.V.:2416.1; IV Piggyback:146] Out: 3280 [Urine:2250; Emesis/NG output:350; Drains:680] This shift:  Total I/O In: -  Out: 650 [Urine:650]  Bowel function:  Flatus: No  BM:  No  Drain: Serosanguinous   Physical Exam:  General: Pt awake/alert in no acute distress Eyes: PERRL, normal EOM.  Sclera clear.  No icterus Neuro: CN II-XII intact w/o focal sensory/motor deficits. Lymph: No head/neck/groin lymphadenopathy Psych:  No delerium/psychosis/paranoia.  Oriented x 4 HENT: Normocephalic, Mucus membranes moist.  No thrush Neck: Supple, No tracheal deviation.  No obvious thyromegaly Chest: No pain to chest wall compression.  Good respiratory excursion.  No audible wheezing CV:  Pulses intact.  Regular rhythm.  No major extremity edema MS: Normal AROM mjr joints.  No obvious deformity  Abdomen: Soft.  Nondistended.  Tenderness at upper abdomen midline but improved .  No incarcerated hernias.  Ext:  No deformity.  No mjr edema.  No cyanosis Skin: No petechiae / purpurea.  No major sores.  Warm and dry    Results:   Cultures: No results found for this or any previous visit (from the past 720 hour(s)).  Labs: Results for orders placed or performed during the hospital encounter of 07/26/22 (from the past 48 hour(s))  Glucose, capillary     Status: Abnormal   Collection Time: 07/27/22  5:24 PM  Result Value Ref Range   Glucose-Capillary 121 (H) 70 - 99 mg/dL    Comment: Glucose reference range applies only to samples taken after fasting for at least 8 hours.  Glucose, capillary     Status: Abnormal   Collection Time: 07/28/22 12:20 AM  Result Value Ref Range   Glucose-Capillary 121 (H) 70 - 99  mg/dL    Comment: Glucose reference range applies only to samples taken after fasting for at least 8 hours.  Comprehensive metabolic panel     Status: Abnormal   Collection Time: 07/28/22  2:17 AM  Result Value Ref Range   Sodium 138 135 - 145 mmol/L   Potassium 3.9 3.5 - 5.1 mmol/L   Chloride 107 98 - 111 mmol/L   CO2 26 22 - 32 mmol/L   Glucose, Bld 129 (H) 70 - 99 mg/dL    Comment: Glucose reference range applies only to samples taken after fasting for at least 8 hours.   BUN 11 6 - 20 mg/dL   Creatinine, Ser 4.09 0.44 - 1.00 mg/dL   Calcium 7.6 (L) 8.9 - 10.3 mg/dL   Total Protein 4.7 (L) 6.5 - 8.1 g/dL   Albumin 2.1 (L) 3.5 - 5.0 g/dL   AST 11 (L) 15 - 41 U/L   ALT 13 0 - 44 U/L   Alkaline Phosphatase 44 38 - 126 U/L   Total Bilirubin  0.3 0.3 - 1.2 mg/dL   GFR, Estimated >16 >10 mL/min    Comment: (NOTE) Calculated using the CKD-EPI Creatinine Equation (2021)    Anion gap 5 5 - 15    Comment: Performed at [facility], [address], [place], [place] [address]  Magnesium     Status: None   Collection Time: 07/28/22  2:17 AM  Result Value Ref Range   Magnesium 1.9 1.7 - 2.4 mg/dL    Comment: Performed at [facility], [address] [place], [place] [address]  Phosphorus     Status: None   Collection Time: 07/28/22  2:17 AM  Result Value Ref Range   Phosphorus 2.5 2.5 - 4.6 mg/dL    Comment: Performed at [facility], [address] [place], [place] [address]  Triglycerides     Status: None   Collection Time: 07/28/22  2:17 AM  Result Value Ref Range   Triglycerides 45 <150 mg/dL    Comment: Performed at [facility], [address], [place], [place] [address]  Glucose, capillary     Status: Abnormal   Collection Time: 07/28/22  6:22 AM  Result Value Ref Range   Glucose-Capillary 119 (H) 70 - 99 mg/dL    Comment: Glucose reference range applies only to samples taken after fasting for at  least 8 hours.  Glucose, capillary     Status: Abnormal   Collection Time: 07/28/22 11:24 AM  Result Value Ref Range   Glucose-Capillary 124 (H) 70 - 99 mg/dL    Comment: Glucose reference range applies only to samples taken after fasting for at least 8 hours.  Glucose, capillary     Status: Abnormal   Collection Time: 07/28/22  5:16 PM  Result Value Ref Range   Glucose-Capillary 107 (H) 70 - 99 mg/dL    Comment: Glucose reference range applies only to samples taken after fasting for at least 8 hours.  Glucose, capillary     Status: Abnormal   Collection Time: 07/28/22 11:41 PM  Result Value Ref Range   Glucose-Capillary 115 (H) 70 - 99 mg/dL    Comment: Glucose reference range applies only to samples taken after fasting for at least 8 hours.  Comprehensive metabolic panel     Status: Abnormal   Collection Time: 07/29/22  3:28 AM  Result Value Ref Range   Sodium 134 (L) 135 - 145 mmol/L   Potassium 3.6 3.5 - 5.1 mmol/L   Chloride 104 98 - 111 mmol/L   CO2 26 22 - 32 mmol/L   Glucose, Bld 128 (H) 70 - 99 mg/dL    Comment: Glucose reference range applies only to samples taken after fasting for at least 8 hours.   BUN 9 6 - 20 mg/dL   Creatinine, Ser 2.13 0.44 - 1.00 mg/dL   Calcium 7.3 (L) 8.9 - 10.3 mg/dL   Total Protein 4.9 (L) 6.5 - 8.1 g/dL   Albumin 2.1 (L) 3.5 - 5.0 g/dL   AST 10 (L) 15 - 41 U/L   ALT 11 0 - 44 U/L   Alkaline Phosphatase 43 38 - 126 U/L   Total Bilirubin 0.6 0.3 - 1.2 mg/dL   GFR, Estimated >08 >65 mL/min    Comment: (NOTE) Calculated using the CKD-EPI Creatinine Equation (2021)    Anion gap 4 (L) 5 - 15    Comment: Performed at [facility], [address] [place], [place] [address]  Phosphorus  Status: None   Collection Time: 07/29/22  3:28 AM  Result Value Ref Range   Phosphorus 3.2 2.5 - 4.6 mg/dL    Comment: Performed at [facility], [address], [place], [place] [address]  Magnesium     Status: None    Collection Time: 07/29/22  3:28 AM  Result Value Ref Range   Magnesium 1.8 1.7 - 2.4 mg/dL    Comment: Performed at [facility], [address] [place], [place] [address]  Glucose, capillary     Status: Abnormal   Collection Time: 07/29/22  5:53 AM  Result Value Ref Range   Glucose-Capillary 121 (H) 70 - 99 mg/dL    Comment: Glucose reference range applies only to samples taken after fasting for at least 8 hours.    Imaging / Studies: [place] EKG SITE RITE  Result Date: 07/27/2022 If Site Rite image not attached, placement could not be confirmed due to current cardiac rhythm.   Medications / Allergies: per chart  Antibiotics: Anti-infectives (From admission, onward)    Start     Dose/Rate Route Frequency Ordered Stop   07/27/22 0800  piperacillin-tazobactam (ZOSYN) IVPB 3.375 g        3.375 g 12.5 mL/hr over 240 Minutes Intravenous Every 8 hours 07/27/22 0213 08/03/22 0559   07/26/22 2245  piperacillin-tazobactam (ZOSYN) IVPB 3.375 g        3.375 g 100 mL/hr over 30 Minutes Intravenous  Once 07/26/22 2234 07/26/22 2323         Note: Portions of this report may have been transcribed using voice recognition software. Every effort was made to ensure accuracy; however, inadvertent computerized transcription errors may be present.   Any transcriptional errors that result from this process are unintentional.    Ardeth Sportsman, MD, FACS, MASCRS Esophageal, Gastrointestinal & Colorectal Surgery Robotic and Minimally Invasive Surgery  [facility] [address] [place], [place] [address] [phone] Fax [phone] Main  CONTACT INFORMATION:  Weekday (9AM-5PM): Call [facility] main office at [phone]  Weeknight (5PM-9AM) or Weekend/Holiday: Check [email or web address] (password " [email or web address]") for General Surgery [facility] coverage  (Please, do not use SecureChat as it is not reliable communication to reach  operating surgeons for immediate patient care given surgeries/outpatient duties/clinic/cross-coverage/off post-call which would lead to a delay in care.  Epic staff messaging available for outptient concerns, but may not be answered for 48 hours or more).     07/29/2022  9:12 AM

## 2022-07-29 NOTE — Plan of Care (Signed)

## 2022-07-29 NOTE — Discharge Instructions (Addendum)
Take Protonix & Carafate to heal your ulcer  STOP SMOKING  AVOID ALCOHOL  Follow up with you Bariatric surgeon at [facility]  See info on wound packing, advancing diet, and quitting smoking  EATING AFTER YOUR ESOPHAGEAL SURGERY (Repair of perforated ulcer)  ######################################################################  EAT Start with a pureed / full liquid diet (see below) Gradually transition to a high fiber diet with a fiber supplement over the next month after discharge.    WALK Walk an hour a day.  Control your pain to do that.    CONTROL PAIN Control pain so that you can walk, sleep, tolerate sneezing/coughing, go up/down stairs.  HAVE A BOWEL MOVEMENT DAILY Keep your bowels regular to avoid problems.  OK to try a laxative to override constipation.  OK to use an antidairrheal to slow down diarrhea.  Call if not better after 2 tries  CALL IF YOU HAVE PROBLEMS/CONCERNS Call if you are still struggling despite following these instructions. Call if you have concerns not answered by these instructions  ######################################################################   After your esophageal surgery, expect some sticking with swallowing over the next 1-2 months.    If food sticks when you eat, it is called "dysphagia".  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  It will gradually ease off over the next few months.  To help you through this temporary phase, we start you out on a pureed (blenderized) diet.  Your first meal in the hospital was thin liquids.  You should have been given a pureed diet by the time you left the hospital.  We ask patients to stay on a pureed diet for the first 2-3 weeks to avoid anything getting "stuck" near your recent surgery.  Don't be alarmed if your ability to swallow doesn't progress according to this plan.  Everyone is different and some diets can advance more or less quickly.     It is often helpful to crush your medications or split them as they can sometimes stick, especially the first week or so.   Some BASIC RULES to follow are: Maintain an upright position whenever eating or drinking. Take small bites - just a teaspoon size bite at a time. Eat slowly.  It may also help to eat only one food at a time. Consider nibbling through smaller, more frequent meals & avoid the urge to eat BIG meals Do not push through feelings of fullness, nausea, or bloatedness Do not mix solid foods and liquids in the same mouthful Try not to "wash foods down" with large gulps of liquids. Avoid carbonated (bubbly/fizzy) drinks.   Avoid foods that make you feel gassy or bloated.  Start with bland foods first.  Wait on trying greasy, fried, or spicy meals until you are tolerating more bland solids well. Understand that it will be hard to burp and belch at first.  This gradually improves with time.  Expect to be more gassy/flatulent/bloated initially.  Walking will help your body manage it better. Consider using medications for bloating that contain simethicone such as  Maalox or Gas-X  Consider crushing her medications, especially smaller pills.  The ability to swallow pills should get easier after a few weeks Eat in a relaxed atmosphere & minimize distractions. Avoid talking while eating.   Do not use straws. Following each meal, sit in an upright position (90 degree angle) for 60 to 90 minutes.  Going for a short walk can help as well If food does stick,  don't panic.  Try to relax and let the food pass on its own.  Sipping WARM LIQUID such as strong hot black tea can also help slide it down.   Be gradual in changes & use common sense:  -If you easily tolerating a certain "level" of foods, advance to the next level gradually -If you are having trouble swallowing a particular food, then avoid it.   -If food is sticking when you advance your diet, go back to thinner previous diet (the  lower LEVEL) for 1-2 days.  LEVEL 1 = PUREED DIET  Do for the first 2 WEEKS AFTER SURGERY  -Foods in this group are pureed or blenderized to a smooth, mashed potato-like consistency.  -If necessary, the pureed foods can keep their shape with the addition of a thickening agent.   -Meat should be pureed to a smooth, pasty consistency.  Hot broth or gravy may be added to the pureed meat, approximately 1 oz. of liquid per 3 oz. serving of meat. -CAUTION:  If any foods do not puree into a smooth consistency, swallowing will be more difficult.  (For example, nuts or seeds sometimes do not blend well.)  Hot Foods Cold Foods  Pureed scrambled eggs and cheese Pureed cottage cheese  Baby cereals Thickened juices and nectars  Thinned cooked cereals (no lumps) Thickened milk or eggnog  Pureed [place] toast or pancakes Ensure  Mashed potatoes Ice cream  Pureed parsley, au gratin, scalloped potatoes, candied sweet potatoes Fruit or [place] ice, sherbet  Pureed buttered or alfredo noodles Plain yogurt  Pureed vegetables (no corn or peas) Instant breakfast  Pureed soups and creamed soups Smooth pudding, mousse, custard  Pureed scalloped apples Whipped gelatin  Gravies Sugar, syrup, honey, jelly  Sauces, cheese, tomato, barbecue, white, creamed Cream  Any baby food Creamer  Alcohol in moderation (not beer or champagne) Margarine  Coffee or tea Mayonnaise   Ketchup, mustard   Apple sauce   SAMPLE MENU:  PUREED DIET Breakfast Lunch Dinner  Orange juice, 1/2 cup Cream of wheat, 1/2 cup Pineapple juice, 1/2 cup Pureed [place], barley soup, 3/4 cup Pureed Hawaiian chicken, 3 oz  Scrambled eggs, mashed or blended with cheese, 1/2 cup Tea or coffee, 1 cup  Whole milk, 1 cup  Non-dairy creamer, 2 Tbsp. Mashed potatoes, 1/2 cup Pureed cooled broccoli, 1/2 cup Apple sauce, 1/2 cup Coffee or tea Mashed potatoes, 1/2 cup Pureed spinach, 1/2 cup Frozen yogurt, 1/2 cup Tea or coffee      LEVEL 2 =  SOFT DIET  After your first 2 weeks, you can advance to a soft diet.   Keep on this diet until everything goes down easily.  Hot Foods Cold Foods  White fish Cottage cheese  Stuffed fish Junior baby fruit  Baby food meals Semi thickened juices  Minced soft cooked, scrambled, poached eggs nectars  Souffle & omelets Ripe mashed bananas  Cooked cereals Canned fruit, pineapple sauce, milk  potatoes Milkshake  Buttered or Alfredo noodles Custard  Cooked cooled vegetable Puddings, including tapioca  Sherbet Yogurt  Vegetable soup or alphabet soup Fruit ice, [place] ice  Gravies Whipped gelatin  Sugar, syrup, honey, jelly Junior baby desserts  Sauces:  Cheese, creamed, barbecue, tomato, white Cream  Coffee or tea Margarine   SAMPLE MENU:  LEVEL 2 Breakfast Lunch Dinner  Orange juice, 1/2 cup Oatmeal, 1/2 cup Scrambled eggs with cheese, 1/2 cup Decaffeinated tea, 1 cup Whole milk, 1 cup Non-dairy creamer, 2 Tbsp Pineapple juice, 1/2 cup  Minced beef, 3 oz Gravy, 2 Tbsp Mashed potatoes, 1/2 cup Minced fresh broccoli, 1/2 cup Applesauce, 1/2 cup Coffee, 1 cup [place], barley soup, 3/4 cup Minced Hawaiian chicken, 3 oz Mashed potatoes, 1/2 cup Cooked spinach, 1/2 cup Frozen yogurt, 1/2 cup Non-dairy creamer, 2 Tbsp      LEVEL 3 = CHOPPED DIET  -After all the foods in level 2 (soft diet) are passing through well you should advance up to more chopped foods.  -It is still important to cut these foods into small pieces and eat slowly.  Hot Foods Cold Foods  Poultry Cottage cheese  Chopped Swedish meatballs Yogurt  Meat salads (ground or flaked meat) Milk  Flaked fish (tuna) Milkshakes  Poached or scrambled eggs Soft, cold, dry cereal  Souffles and omelets Fruit juices or nectars  Cooked cereals Chopped canned fruit  Chopped [place] toast or pancakes Canned fruit cocktail  Noodles or pasta (no rice) Pudding, mousse, custard  Cooked vegetables (no frozen peas, corn, or mixed  vegetables) Green salad  Canned small sweet peas Ice cream  Creamed soup or vegetable soup Fruit ice, [place] ice  Pureed vegetable soup or alphabet soup Non-dairy creamer  Ground scalloped apples Margarine  Gravies Mayonnaise  Sauces:  Cheese, creamed, barbecue, tomato, white Ketchup  Coffee or tea Mustard   SAMPLE MENU:  LEVEL 3 Breakfast Lunch Dinner  Orange juice, 1/2 cup Oatmeal, 1/2 cup Scrambled eggs with cheese, 1/2 cup Decaffeinated tea, 1 cup Whole milk, 1 cup Non-dairy creamer, 2 Tbsp Ketchup, 1 Tbsp Margarine, 1 tsp Salt, 1/4 tsp Sugar, 2 tsp Pineapple juice, 1/2 cup Ground beef, 3 oz Gravy, 2 Tbsp Mashed potatoes, 1/2 cup Cooked spinach, 1/2 cup Applesauce, 1/2 cup Decaffeinated coffee Whole milk Non-dairy creamer, 2 Tbsp Margarine, 1 tsp Salt, 1/4 tsp Pureed [place], barley soup, 3/4 cup Barbecue chicken, 3 oz Mashed potatoes, 1/2 cup Ground fresh broccoli, 1/2 cup Frozen yogurt, 1/2 cup Decaffeinated tea, 1 cup Non-dairy creamer, 2 Tbsp Margarine, 1 tsp Salt, 1/4 tsp Sugar, 1 tsp    LEVEL 4:  REGULAR FOODS  -Foods in this group are soft, moist, regularly textured foods.   -This level includes meat and breads, which tend to be the hardest things to swallow.   -Eat very slowly, chew well and continue to avoid carbonated drinks. -most people are at this level in 4-6 weeks  Hot Foods Cold Foods  Baked fish or skinned Soft cheeses - cottage cheese  Souffles and omelets Cream cheese  Eggs Yogurt  Stuffed shells Milk  Spaghetti with meat sauce Milkshakes  Cooked cereal Cold dry cereals (no nuts, dried fruit, coconut)  [place] toast or pancakes Crackers  Buttered toast Fruit juices or nectars  Noodles or pasta (no rice) Canned fruit  Potatoes (all types) Ripe bananas  Soft, cooked vegetables (no corn, lima, or baked beans) Peeled, ripe, fresh fruit  Creamed soups or vegetable soup Cakes (no nuts, dried fruit, coconut)  Canned chicken noodle soup  Plain doughnuts  Gravies Ice cream  Bacon dressing Pudding, mousse, custard  Sauces:  Cheese, creamed, barbecue, tomato, white Fruit ice, [place] ice, sherbet  Decaffeinated tea or coffee Whipped gelatin  Pork chops Regular gelatin   Canned fruited gelatin molds   Sugar, syrup, honey, jam, jelly   Cream   Non-dairy   Margarine   Oil   Mayonnaise   Ketchup   Mustard   TROUBLESHOOTING IRREGULAR BOWELS  1) Avoid extremes of bowel movements (no bad constipation/diarrhea)  2) Miralax 17gm  mixed in 8oz. water or juice-daily. May use BID as needed.  3) Gas-x,Phazyme, etc. as needed for gas & bloating.  4) Soft,bland diet. No spicy,greasy,fried foods.  5) Prilosec over-the-counter as needed  6) May hold gluten/wheat products from diet to see if symptoms improve.  7) May try probiotics (Align, Activa, etc) to help calm the bowels down  7) If symptoms become worse call back immediately.    If you have any questions please call our office at [facility]: [phone].   WOUND CARE  It is important that the wound be kept open.   -Keeping the skin edges apart will allow the wound to gradually heal from the base upwards.   - If the skin edges of the wound close too early, a new fluid pocket can form and infection can occur. -This is the reason to pack deeper wounds with gauze or ribbon -This is why drained wounds cannot be sewed closed right away  A healthy wound should form a lining of bright red "beefy" granulating tissue that will help shrink the wound and help the edges grow new skin into it.   -A little mucus / yellow discharge is normal (the body's natural way to try and form a scab) and should be gently washed off with soap and water with daily dressing changes.  -Green or foul smelling drainage implies bacterial colonization and can slow wound healing - a short course of antibiotic ointment (3-5 days) can help it clear up.  Call the doctor if it does not improve or  worsens  -Avoid use of antibiotic ointments for more than a week as they can slow wound healing over time.    -Sometimes other wound care products will be used to reduce need for dressing changes and/or help clean up dirty wounds -Sometimes the surgeon needs to debride the wound in the office to remove dead or infected tissue out of the wound so it can heal more quickly and safely.    Change the dressing at least once a day -Wash the wound with mild soap and water gently every day.  It is good to shower or bathe the wound to help it clean out. -Use clean 4x4 gauze for medium/large wounds or ribbon plain NU-gauze for smaller wounds (it does not need to be sterile, just clean) -Keep the raw wound moist with a little saline or KY (saline) gel on the gauze.  -A dry wound will take longer to heal.  -Keep the skin dry around the wound to prevent breakdown and irritation. -Pack the wound down to the base -The goal is to keep the skin apart, not overpack the wound -Use a Q-tip or blunt-tipped kabob stick toothpick to push the gauze down to the base in narrow or deep wounds   -Cover with a clean gauze and tape -paper or Medipore tape tend to be gentle on the skin -rotate the orientation of the tape to avoid repeated stress/trauma on the skin -using an ACE or Coban wrap on wounds on arms or legs can be used instead.  Complete all antibiotics through the entire prescription to help the infection heal and prevent new places of infection   Returning the see the surgeon is helpful to follow the healing process and help the wound close as fast as possible.    STOP SMOKING!  We strongly recommend that you stop smoking.  Smoking increases the risk of surgery including infection in the form of an open wound, pus formation, abscess,  hernia at an incision on the abdomen, etc.  You have an increased risk of other MAJOR complications such as stroke, heart attack, forming clots in the leg and/or lungs, and  death.    Smoking Cessation Quitting smoking is important to your health and has many advantages. However, it is not always easy to quit since nicotine is a very addictive drug. Often times, people try 3 times or more before being able to quit. This document explains the best ways for you to prepare to quit smoking. Quitting takes hard work and a lot of effort, but you can do it. ADVANTAGES OF QUITTING SMOKING You will live longer, feel better, and live better. Your body will feel the impact of quitting smoking almost immediately. Within 20 minutes, blood pressure decreases. Your pulse returns to its normal level. After 8 hours, carbon monoxide levels in the blood return to normal. Your oxygen level increases. After 24 hours, the chance of having a heart attack starts to decrease. Your breath, hair, and body stop smelling like smoke. After 48 hours, damaged nerve endings begin to recover. Your sense of taste and smell improve. After 72 hours, the body is virtually free of nicotine. Your bronchial tubes relax and breathing becomes easier. After 2 to 12 weeks, lungs can hold more air. Exercise becomes easier and circulation improves. The risk of having a heart attack, stroke, cancer, or lung disease is greatly reduced. After 1 year, the risk of coronary heart disease is cut in half. After 5 years, the risk of stroke falls to the same as a nonsmoker. After 10 years, the risk of lung cancer is cut in half and the risk of other cancers decreases significantly. After 15 years, the risk of coronary heart disease drops, usually to the level of a nonsmoker. If you are pregnant, quitting smoking will improve your chances of having a healthy baby. The people you live with, especially any children, will be healthier. You will have extra money to spend on things other than cigarettes. QUESTIONS TO THINK ABOUT BEFORE ATTEMPTING TO QUIT You may want to talk about your answers with your caregiver. Why do you  want to quit? If you tried to quit in the past, what helped and what did not? What will be the most difficult situations for you after you quit? How will you plan to handle them? Who can help you through the tough times? Your family? Friends? A caregiver? What pleasures do you get from smoking? What ways can you still get pleasure if you quit? Here are some questions to ask your caregiver: How can you help me to be successful at quitting? What medicine do you think would be best for me and how should I take it? What should I do if I need more help? What is smoking withdrawal like? How can I get information on withdrawal? GET READY Set a quit date. Change your environment by getting rid of all cigarettes, ashtrays, matches, and lighters in your home, car, or work. Do not let people smoke in your home. Review your past attempts to quit. Think about what worked and what did not. GET SUPPORT AND ENCOURAGEMENT You have a better chance of being successful if you have help. You can get support in many ways. Tell your family, friends, and co-workers that you are going to quit and need their support. Ask them not to smoke around you. Get individual, group, or telephone counseling and support. Programs are available at [employer] and health centers.  Call your local health department for information about programs in your area. Spiritual beliefs and practices may help some smokers quit. Download a "quit meter" on your computer to keep track of quit statistics, such as how long you have gone without smoking, cigarettes not smoked, and money saved. Get a self-help book about quitting smoking and staying off of tobacco. LEARN NEW SKILLS AND BEHAVIORS Distract yourself from urges to smoke. Talk to someone, go for a walk, or occupy your time with a task. Change your normal routine. Take a different route to work. Drink tea instead of coffee. Eat breakfast in a different place. Reduce your stress. Take a  hot bath, exercise, or read a book. Plan something enjoyable to do every day. Reward yourself for not smoking. Explore interactive web-based programs that specialize in helping you quit. GET MEDICINE AND USE IT CORRECTLY Medicines can help you stop smoking and decrease the urge to smoke. Combining medicine with the above behavioral methods and support can greatly increase your chances of successfully quitting smoking. Nicotine replacement therapy helps deliver nicotine to your body without the negative effects and risks of smoking. Nicotine replacement therapy includes nicotine gum, lozenges, inhalers, nasal sprays, and skin patches. Some may be available over-the-counter and others require a prescription. Antidepressant medicine helps people abstain from smoking, but how this works is unknown. This medicine is available by prescription. Nicotinic receptor partial agonist medicine simulates the effect of nicotine in your brain. This medicine is available by prescription. Ask your caregiver for advice about which medicines to use and how to use them based on your health history. Your caregiver will tell you what side effects to look out for if you choose to be on a medicine or therapy. Carefully read the information on the package. Do not use any other product containing nicotine while using a nicotine replacement product.  RELAPSE OR DIFFICULT SITUATIONS Most relapses occur within the first 3 months after quitting. Do not be discouraged if you start smoking again. Remember, most people try several times before finally quitting. You may have symptoms of withdrawal because your body is used to nicotine. You may crave cigarettes, be irritable, feel very hungry, cough often, get headaches, or have difficulty concentrating. The withdrawal symptoms are only temporary. They are strongest when you first quit, but they will go away within 10 14 days. To reduce the chances of relapse, try to: Avoid drinking  alcohol. Drinking lowers your chances of successfully quitting. Reduce the amount of caffeine you consume. Once you quit smoking, the amount of caffeine in your body increases and can give you symptoms, such as a rapid heartbeat, sweating, and anxiety. Avoid smokers because they can make you want to smoke. Do not let weight gain distract you. Many smokers will gain weight when they quit, usually less than 10 pounds. Eat a healthy diet and stay active. You can always lose the weight gained after you quit. Find ways to improve your mood other than smoking. FOR MORE INFORMATION  www.smokefree.gov    While it can be one of the most difficult things to do, the [place] community has programs to help you stop.  Consider talking with your primary care physician about options.  Also, Smoking Cessation classes are available through the [facility]:  The smoking cessation program is a proven-effective program from the American Lung Association. The program is available for anyone 45 and older who currently smokes. The program lasts for 7 weeks and is 8 sessions. Each class will  be approximately 1 1/2 hours. The program is every Tuesday.  All classes are 12-1:30pm and same location.  Event Location Information:  Location: [facility] 2nd Floor Conference Room 2-037; located next to [facility] cross streets: [facility] Entrance into the [facility] is adjacent to the [facility] main entrance. The conference room is located on the 2nd floor.  Parking Instructions: Visitor parking is adjacent to [employer] main entrance and the Cancer Center   A smoking cessation program is also offered through the [facility]. Register online at [email or web address] or call [phone] for more information.  Tobacco cessation counseling is available at [facility] Call [phone] for a free appointment.   Tobacco cessation classes also are available through the [facility] in [place]. For information, call [phone].  The Patient Education Network features videos on tobacco cessation. Please consult your listings in the center of this book to find instructions on how to access this resource.  If you want more information, ask your nurse.

## 2022-07-30 ENCOUNTER — Inpatient Hospital Stay (HOSPITAL_COMMUNITY): Payer: Medicaid Other

## 2022-07-30 LAB — COMPREHENSIVE METABOLIC PANEL
ALT: 12 U/L (ref 0–44)
AST: 12 U/L — ABNORMAL LOW (ref 15–41)
Albumin: 2.1 g/dL — ABNORMAL LOW (ref 3.5–5.0)
Alkaline Phosphatase: 43 U/L (ref 38–126)
Anion gap: 4 — ABNORMAL LOW (ref 5–15)
BUN: 9 mg/dL (ref 6–20)
CO2: 27 mmol/L (ref 22–32)
Calcium: 7.4 mg/dL — ABNORMAL LOW (ref 8.9–10.3)
Chloride: 104 mmol/L (ref 98–111)
Creatinine, Ser: 0.35 mg/dL — ABNORMAL LOW (ref 0.44–1.00)
GFR, Estimated: >60 mL/min (ref >60)
Glucose, Bld: 109 mg/dL — ABNORMAL HIGH (ref 70–99)
Potassium: 3.8 mmol/L (ref 3.5–5.1)
Sodium: 135 mmol/L (ref 135–145)
Total Bilirubin: 0.2 mg/dL — ABNORMAL LOW (ref 0.3–1.2)
Total Protein: 5.1 g/dL — ABNORMAL LOW (ref 6.5–8.1)

## 2022-07-30 LAB — GLUCOSE, CAPILLARY
Glucose-Capillary: 140 mg/dL — ABNORMAL HIGH (ref 70–99)
Glucose-Capillary: 96 mg/dL (ref 70–99)
Glucose-Capillary: 78 mg/dL (ref 70–99)

## 2022-07-30 LAB — PHOSPHORUS: Phosphorus: 3.3 mg/dL (ref 2.5–4.6)

## 2022-07-30 LAB — MAGNESIUM: Magnesium: 1.8 mg/dL (ref 1.7–2.4)

## 2022-07-30 MED ORDER — METHOCARBAMOL 500 MG PO TABS
1000.0000 mg | ORAL_TABLET | Freq: Three times a day (TID) | ORAL | Status: DC
  Administered 2022-07-30 – 2022-08-01 (×7): 1000 mg via ORAL
  Filled 2022-07-30 (×7): qty 2

## 2022-07-30 MED ORDER — ACETAMINOPHEN 325 MG PO TABS
650.0000 mg | ORAL_TABLET | Freq: Four times a day (QID) | ORAL | Status: DC
  Administered 2022-07-30 – 2022-08-01 (×9): 650 mg via ORAL
  Filled 2022-07-30 (×9): qty 2

## 2022-07-30 MED ORDER — TRAVASOL 10 % IV SOLN
INTRAVENOUS | Status: AC
  Filled 2022-07-30: qty 806.4

## 2022-07-30 MED ORDER — IOHEXOL 300 MG/ML  SOLN
50.0000 mL | Freq: Once | INTRAMUSCULAR | Status: AC | PRN
  Administered 2022-07-30: 50 mL via ORAL

## 2022-07-30 MED ORDER — OXYCODONE HCL 5 MG PO TABS
5.0000 mg | ORAL_TABLET | ORAL | Status: DC | PRN
  Administered 2022-07-31 – 2022-08-01 (×4): 10 mg via ORAL
  Filled 2022-07-30 (×4): qty 2

## 2022-07-30 MED ORDER — SUCRALFATE 1 GM/10ML PO SUSP
1.0000 g | Freq: Three times a day (TID) | ORAL | Status: DC
  Administered 2022-07-30 – 2022-08-01 (×9): 1 g via ORAL
  Filled 2022-07-30 (×9): qty 10

## 2022-07-30 MED ORDER — DIAZEPAM 5 MG/ML IJ SOLN
5.0000 mg | Freq: Three times a day (TID) | INTRAMUSCULAR | Status: DC | PRN
  Administered 2022-07-30: 10 mg via INTRAVENOUS
  Filled 2022-07-30: qty 2

## 2022-07-30 NOTE — Plan of Care (Signed)
  Problem: Education: Goal: Knowledge of General Education information will improve Description Including pain rating scale, medication(s)/side effects and non-pharmacologic comfort measures Outcome: Progressing   Problem: Health Behavior/Discharge Planning: Goal: Ability to manage health-related needs will improve Outcome: Progressing   

## 2022-07-30 NOTE — Progress Notes (Signed)
Mobility Specialist Cancellation/Refusal Note:  Pt declined mobility at this time with no specific reasoning.  Will check back as schedule permits.     [facility]

## 2022-07-30 NOTE — Progress Notes (Signed)
PHARMACY - TOTAL PARENTERAL NUTRITION CONSULT NOTE   Indication:  Inability to tolerate enteral feeding   Patient Measurements: Height: 5\' 7"  (170.2 cm) Weight: 57.4 kg (126 lb 8.7 oz) IBW/kg (Calculated) : 61.6 TPN AdjBW (KG): 56.7 Body mass index is 19.82 kg/m. Usual Weight:   Assessment: Patient is a 42 y.o F with hx gastric bypass 2021 and gastrojejunal anastomosis perforation requiring emergency surgery with Graham's patch 2023 who presented to the ED on 07/26/22 with c/o abdominal pain and melena. She underwent emergent exp lap on 07/27/22 with noted "ulcer of gastrojejunostomy anastomosis from previous gastric bypass with peritonitis"  and had Cheree Ditto patch closure of ulceration to the GJ anastomosis. Pharmacy has been consulted post-op to dose TPN.  Glucose / Insulin: no hx DM - on sSSI q6h (used 1 unit since TPN rate increased 7/16) - cbgs (goal <150): 102-140 Electrolytes: lytes wnl including CorrCa 8.9 Renal: scr <1; BUN wnL Hepatic: AST low 12 (improved), Tbili low 0.2 Intake / Output; MIVF: D5NS @ 40 ml/hr - I/O: net -1532 mL/24 hrs - NG/OG: not recorded - drain: -90 mL GI Imaging: - 07/26/22 CT angio GI: possible extraluminal extension of gas from the proximal Roux limb just beyond the gastrojejunostomy. Moderate ascites. Diffuse bowel wall thickening involving the small bowel, possibly reflecting changes of peritonitis. No active gastrointestinal hemorrhage identified.  GI Surgeries / Procedures:  - 07/27/22: Exploratory laparotomy with Cheree Ditto patch closure of ulceration to the GJ anastomosis   Central access: PICC placed 07/27/22 TPN start date: 07/27/22  Nutritional Goals: Goal TPN rate is 80 mL/hr (provides 80 g of protein and 1613 kcals per day)  RD Assessment: Estimated Needs Total Energy Estimated Needs: 1425-1700 Total Protein Estimated Needs: 70-85 g Total Fluid Estimated Needs: >1.7 L  Current Nutrition:  - 7/14 TPN >>  - thiamine 100 mg daily ordered by  CCS  - 7/15: changed Folic acid to IV per RN's request  Plan:  At 1800:  - Continue TPN at goal rate of 80 mL/hr - Electrolytes in TPN:  Na 49mEq/L K 26mEq/L Ca 62mEq/L Mg 69mEq/L Phos 16mmol/L Cl:Ac 1:1 - Add standard MVI and trace elements to TPN - Continue sSSI q6h SSI and adjust as needed  - Continue D5NS at 30ml/hr - Monitor TPN labs on Mon/Thurs - CMET, mag and phos daily thru 7/18   Cherylin Mylar, PharmD Clinical Pharmacist  7/17/20247:14 AM

## 2022-07-30 NOTE — Progress Notes (Signed)
Progress Note  3 Days Post-Op  Subjective: Pt had UGI this AM and no leak demonstrated. She reports incisional pain. Passing some flatus but no BM.   Objective: Vital signs in last 24 hours: Temp:  [98 F (36.7 C)-98.3 F (36.8 C)] 98 F (36.7 C) (07/17 0426) Pulse Rate:  [61-79] 79 (07/17 0426) Resp:  [16-18] 16 (07/17 0426) BP: (110-123)/(66-80) 110/66 (07/17 0426) SpO2:  [98 %-100 %] 98 % (07/17 0426) Weight:  [57.4 kg] 57.4 kg (07/17 0500) Last BM Date : 07/28/22  Intake/Output from previous day: 07/16 0701 - 07/17 0700 In: 3203.2 [P.O.:180; I.V.:2604.5; IV Piggyback:418.7] Out: 4670 [Urine:4350; Emesis/NG output:50; Drains:270] Intake/Output this shift: Total I/O In: -  Out: 860 [Urine:550; Emesis/NG output:300; Drains:10]  PE: General: pleasant, WD, thin female who is laying in bed in NAD Lungs: Respiratory effort nonlabored Abd: soft, appropriately ttp, drain with scant fluid, dressing C/D/I, ND Psych: A&Ox3 with an appropriate affect.    Lab Results:  No results for input(s): "WBC", "HGB", "HCT", "PLT" in the last 72 hours. BMET Recent Labs    07/29/22 0328 07/30/22 0332  NA 134* 135  K 3.6 3.8  CL 104 104  CO2 26 27  GLUCOSE 128* 109*  BUN 9 9  CREATININE 0.45 0.35*  CALCIUM 7.3* 7.4*   PT/INR No results for input(s): "LABPROT", "INR" in the last 72 hours. CMP     Component Value Date/Time   NA 135 07/30/2022 0332   K 3.8 07/30/2022 0332   CL 104 07/30/2022 0332   CO2 27 07/30/2022 0332   GLUCOSE 109 (H) 07/30/2022 0332   BUN 9 07/30/2022 0332   CREATININE 0.35 (L) 07/30/2022 0332   CALCIUM 7.4 (L) 07/30/2022 0332   PROT 5.1 (L) 07/30/2022 0332   ALBUMIN 2.1 (L) 07/30/2022 0332   AST 12 (L) 07/30/2022 0332   ALT 12 07/30/2022 0332   ALKPHOS 43 07/30/2022 0332   BILITOT 0.2 (L) 07/30/2022 0332   GFRNONAA >60 07/30/2022 0332   GFRAA >60 05/27/2016 0821   Lipase     Component Value Date/Time   LIPASE 27 05/26/2022 1733        Studies/Results: DG ESOPHAGUS W SINGLE CM (SOL OR THIN BA)  Result Date: 07/30/2022 CLINICAL DATA:  Acute gastro jejunal ulcer with perforation status post repair. History of prior Roux-en-Y gastric bypass. Rule out leak. EXAM: WATER SOLUBLE UPPER GI SERIES TECHNIQUE: Single-column upper GI series was performed using water soluble contrast. Radiation Exposure Index (as provided by the fluoroscopic device): 7.3 mGy Kerma CONTRAST:  50mL OMNIPAQUE IOHEXOL 300 MG/ML  SOLN COMPARISON:  None Available. FLUOROSCOPY: Fluoroscopy Time:  1 minute Radiation Exposure Index (if provided by the fluoroscopic device): 7.3 Number of Acquired Spot Images: 4 FINDINGS: A scout image was initially obtained which shows an indwelling enteric catheter within the stomach. Residual pneumoperitoneum identified along the undersurface of the right hemidiaphragm. The patient ingested a total of 50 cc of water-soluble contrast material. Contrast easily passed through the esophagus and into the residual gastric pouch. No extravasation of contrast material identified contrast was followed through the remnant gastric pouch and into the proximal jejunum beyond the level of the gastrojejunostomy. No extravasation of the water-soluble contrast material identified to suggest leak. IMPRESSION: 1. No evidence for leak.  Patent gastrojejunostomy. 2. Residual pneumoperitoneum identified along the undersurface of the right hemidiaphragm. Electronically Signed   By: Signa Kell M.D.   On: 07/30/2022 09:24    Anti-infectives: Anti-infectives (From admission, onward)  Start     Dose/Rate Route Frequency Ordered Stop   07/27/22 0800  piperacillin-tazobactam (ZOSYN) IVPB 3.375 g        3.375 g 12.5 mL/hr over 240 Minutes Intravenous Every 8 hours 07/27/22 0213 08/03/22 0559   07/26/22 2245  piperacillin-tazobactam (ZOSYN) IVPB 3.375 g        3.375 g 100 mL/hr over 30 Minutes Intravenous  Once 07/26/22 2234 07/26/22 2323         Assessment/Plan  POD3 s/p ex-lap with Cheree Ditto patch of ulceration at GJ anastomosis  - UGI today negative for leak  - NGT removed and starting bariatric liquid diet, would not advance past this today - mobilize as tolerated - work on PO pain control, ok to have bendryl prn if itching with oxycodone - send stool for H. Pylori if she has a BM - BID dressing changes  - continue JP at least until taking solid food but if remains SS likely remove prior to DC - carafate and PPI  FEN: bari fulls, TPN VTE: LMWH ID: [email or web address]  LOS: 3 days    Juliet Rude, [facility] 07/30/2022, 11:34 AM Please see Amion for pager number during day hours 7:00am-4:30pm

## 2022-07-30 NOTE — Plan of Care (Signed)

## 2022-07-31 LAB — GLUCOSE, CAPILLARY
Glucose-Capillary: 120 mg/dL — ABNORMAL HIGH (ref 70–99)
Glucose-Capillary: 81 mg/dL (ref 70–99)
Glucose-Capillary: 96 mg/dL (ref 70–99)
Glucose-Capillary: 99 mg/dL (ref 70–99)

## 2022-07-31 LAB — CBC
HCT: 28.8 % — ABNORMAL LOW (ref 36.0–46.0)
Hemoglobin: 8.6 g/dL — ABNORMAL LOW (ref 12.0–15.0)
MCH: 23.8 pg — ABNORMAL LOW (ref 26.0–34.0)
MCHC: 29.9 g/dL — ABNORMAL LOW (ref 30.0–36.0)
MCV: 79.6 fL — ABNORMAL LOW (ref 80.0–100.0)
Platelets: 268 10*3/uL (ref 150–400)
RBC: 3.62 MIL/uL — ABNORMAL LOW (ref 3.87–5.11)
RDW: 16.2 % — ABNORMAL HIGH (ref 11.5–15.5)
WBC: 5.2 10*3/uL (ref 4.0–10.5)
nRBC: 0 % (ref 0.0–0.2)

## 2022-07-31 LAB — COMPREHENSIVE METABOLIC PANEL
ALT: 13 U/L (ref 0–44)
AST: 15 U/L (ref 15–41)
Albumin: 2.2 g/dL — ABNORMAL LOW (ref 3.5–5.0)
Alkaline Phosphatase: 44 U/L (ref 38–126)
Anion gap: 5 (ref 5–15)
BUN: 12 mg/dL (ref 6–20)
CO2: 29 mmol/L (ref 22–32)
Calcium: 7.8 mg/dL — ABNORMAL LOW (ref 8.9–10.3)
Chloride: 102 mmol/L (ref 98–111)
Creatinine, Ser: 0.42 mg/dL — ABNORMAL LOW (ref 0.44–1.00)
GFR, Estimated: >60 mL/min (ref >60)
Glucose, Bld: 101 mg/dL — ABNORMAL HIGH (ref 70–99)
Potassium: 4.1 mmol/L (ref 3.5–5.1)
Sodium: 136 mmol/L (ref 135–145)
Total Bilirubin: 0.5 mg/dL (ref 0.3–1.2)
Total Protein: 5.3 g/dL — ABNORMAL LOW (ref 6.5–8.1)

## 2022-07-31 LAB — AMYLASE, PLEURAL OR PERITONEAL FLUID: Amylase, Fluid: 16 U/L

## 2022-07-31 LAB — MAGNESIUM: Magnesium: 2 mg/dL (ref 1.7–2.4)

## 2022-07-31 LAB — AMYLASE: Amylase: 23 U/L — ABNORMAL LOW (ref 28–100)

## 2022-07-31 LAB — PHOSPHORUS: Phosphorus: 3.8 mg/dL (ref 2.5–4.6)

## 2022-07-31 MED ORDER — PANTOPRAZOLE SODIUM 40 MG PO TBEC
40.0000 mg | DELAYED_RELEASE_TABLET | Freq: Two times a day (BID) | ORAL | Status: DC
  Administered 2022-07-31 – 2022-08-01 (×2): 40 mg via ORAL
  Filled 2022-07-31 (×2): qty 1

## 2022-07-31 MED ORDER — TRAVASOL 10 % IV SOLN
INTRAVENOUS | Status: DC
  Filled 2022-07-31: qty 806.4

## 2022-07-31 MED ORDER — HYDROMORPHONE HCL 1 MG/ML IJ SOLN: 0.5000 mg | INTRAMUSCULAR | Status: DC | PRN

## 2022-07-31 MED ORDER — HYDROMORPHONE HCL 1 MG/ML IJ SOLN
0.5000 mg | Freq: Four times a day (QID) | INTRAMUSCULAR | Status: DC | PRN
  Administered 2022-07-31 (×3): 0.5 mg via INTRAVENOUS
  Filled 2022-07-31 (×3): qty 0.5

## 2022-07-31 MED ORDER — POLYETHYLENE GLYCOL 3350 17 G PO PACK
17.0000 g | PACK | Freq: Every day | ORAL | Status: DC
  Administered 2022-08-01: 17 g via ORAL
  Filled 2022-07-31 (×2): qty 1

## 2022-07-31 MED ORDER — DOCUSATE SODIUM 100 MG PO CAPS
100.0000 mg | ORAL_CAPSULE | Freq: Two times a day (BID) | ORAL | Status: DC
  Administered 2022-07-31 – 2022-08-01 (×3): 100 mg via ORAL
  Filled 2022-07-31 (×3): qty 1
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
## 2022-07-31 NOTE — Progress Notes (Signed)
Patient ID: Tina [name], female   DOB: [birth year], 42 y.o.   MRN: [id number] [facility] Progress Note  4 Days Post-Op  Subjective: CC-  Abdominal pain about the same. No worse with PO intake. Pain is worst at JP drain site. Denies n/v. Passing flatus and had a good BM last night.  Objective: Vital signs in last 24 hours: Temp:  [98.1 F (36.7 C)-98.6 F (37 C)] 98.6 F (37 C) (07/18 0448) Pulse Rate:  [61-73] 71 (07/18 0448) Resp:  [16] 16 (07/18 0448) BP: (104-117)/(63-75) 104/63 (07/18 0448) SpO2:  [97 %-100 %] 100 % (07/18 0448) Last BM Date : 07/30/22  Intake/Output from previous day: 07/17 0701 - 07/18 0700 In: 1849.7 [P.O.:360; I.V.:1274.8; IV Piggyback:214.9] Out: 3692.5 [Urine:3350; Emesis/NG output:300; Drains:42.5] Intake/Output this shift: No intake/output data recorded.  PE: Gen:  Alert, NAD Pulm: respiratory effort nonlabored on room air Abd: soft, ND, appropriately tender over incision, open midline beefy red with no purulent drainage, JP with serous fluid in bulb  Lab Results:  Recent Labs    07/31/22 0252  WBC 5.2  HGB 8.6*  HCT 28.8*  PLT 268   BMET Recent Labs    07/30/22 0332 07/31/22 0252  NA 135 136  K 3.8 4.1  CL 104 102  CO2 27 29  GLUCOSE 109* 101*  BUN 9 12  CREATININE 0.35* 0.42*  CALCIUM 7.4* 7.8*   PT/INR No results for input(s): "LABPROT", "INR" in the last 72 hours. CMP     Component Value Date/Time   NA 136 07/31/2022 0252   K 4.1 07/31/2022 0252   CL 102 07/31/2022 0252   CO2 29 07/31/2022 0252   GLUCOSE 101 (H) 07/31/2022 0252   BUN 12 07/31/2022 0252   CREATININE 0.42 (L) 07/31/2022 0252   CALCIUM 7.8 (L) 07/31/2022 0252   PROT 5.3 (L) 07/31/2022 0252   ALBUMIN 2.2 (L) 07/31/2022 0252   AST 15 07/31/2022 0252   ALT 13 07/31/2022 0252   ALKPHOS 44 07/31/2022 0252   BILITOT 0.5 07/31/2022 0252   GFRNONAA >60 07/31/2022 0252   GFRAA >60 05/27/2016 0821   Lipase     Component Value Date/Time    LIPASE 27 05/26/2022 1733       Studies/Results: DG ESOPHAGUS W SINGLE CM (SOL OR THIN BA)  Result Date: 07/30/2022 CLINICAL DATA:  Acute gastro jejunal ulcer with perforation status post repair. History of prior Roux-en-Y gastric bypass. Rule out leak. EXAM: WATER SOLUBLE UPPER GI SERIES TECHNIQUE: Single-column upper GI series was performed using water soluble contrast. Radiation Exposure Index (as provided by the fluoroscopic device): 7.3 mGy Kerma CONTRAST:  50mL OMNIPAQUE IOHEXOL 300 MG/ML  SOLN COMPARISON:  None Available. FLUOROSCOPY: Fluoroscopy Time:  1 minute Radiation Exposure Index (if provided by the fluoroscopic device): 7.3 Number of Acquired Spot Images: 4 FINDINGS: A scout image was initially obtained which shows an indwelling enteric catheter within the stomach. Residual pneumoperitoneum identified along the undersurface of the right hemidiaphragm. The patient ingested a total of 50 cc of water-soluble contrast material. Contrast easily passed through the esophagus and into the residual gastric pouch. No extravasation of contrast material identified contrast was followed through the remnant gastric pouch and into the proximal jejunum beyond the level of the gastrojejunostomy. No extravasation of the water-soluble contrast material identified to suggest leak. IMPRESSION: 1. No evidence for leak.  Patent gastrojejunostomy. 2. Residual pneumoperitoneum identified along the undersurface of the right hemidiaphragm. Electronically Signed   By: Signa Kell  M.D.   On: 07/30/2022 09:24    Anti-infectives: Anti-infectives (From admission, onward)    Start     Dose/Rate Route Frequency Ordered Stop   07/27/22 0800  piperacillin-tazobactam (ZOSYN) IVPB 3.375 g        3.375 g 12.5 mL/hr over 240 Minutes Intravenous Every 8 hours 07/27/22 0213 08/03/22 0559   07/26/22 2245  piperacillin-tazobactam (ZOSYN) IVPB 3.375 g        3.375 g 100 mL/hr over 30 Minutes Intravenous  Once 07/26/22  2234 07/26/22 2323        Assessment/Plan POD4 s/p ex-lap with Cheree Ditto patch of ulceration at GJ anastomosis  - UGI 7/17 negative for leak  - Advance to bariatric advanced diet. Wean to d/c TPN after current bag - mobilize as tolerated - transition to PO pain control - H. Pylori pending - drain amylase negative, d/c JP drain - BID dressing changes  - carafate and PPI - patient is going to call Dr. Adolphus Birchwood to make an appointment - hopefully home tomorrow - social work consult for resources regarding substance abuse   FEN: bari advanced, wean TPN VTE: LMWH ID: none currently    LOS: 4 days    Franne Forts, [facility] 07/31/2022, 10:07 AM Please see Amion for pager number during day hours 7:00am-4:30pm
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
## 2022-07-31 NOTE — Plan of Care (Signed)
  Problem: Education: Goal: Knowledge of General Education information will improve Description Including pain rating scale, medication(s)/side effects and non-pharmacologic comfort measures Outcome: Progressing   

## 2022-07-31 NOTE — Progress Notes (Addendum)
PHARMACY - TOTAL PARENTERAL NUTRITION CONSULT NOTE   Indication:  Inability to tolerate enteral feeding   Patient Measurements: Height: 5\' 7"  (170.2 cm) Weight: 57.4 kg (126 lb 8.7 oz) IBW/kg (Calculated) : 61.6 TPN AdjBW (KG): 56.7 Body mass index is 19.82 kg/m. Usual Weight:   Assessment: Patient is a 42 y.o F with hx gastric bypass 2021 and gastrojejunal anastomosis perforation requiring emergency surgery with Graham's patch 2023 who presented to the ED on 07/26/22 with c/o abdominal pain and melena. She underwent emergent exp lap on 07/27/22 with noted "ulcer of gastrojejunostomy anastomosis from previous gastric bypass with peritonitis"  and had Cheree Ditto patch closure of ulceration to the GJ anastomosis. Pharmacy has been consulted post-op to dose TPN.  Glucose / Insulin: no hx DM - on sSSI q6h (no insulin used in last 24 hrs) - cbgs (goal <150): 78-120 Electrolytes: lytes wnl including CorrCa 9.2 Renal: scr <1; BUN wnL Hepatic: AST 15 (improved), Tbili 0.5 (improved) Intake / Output; MIVF: D5NS @ 40 ml/hr - I/O: net -3070 mL/24 hrs - urine: -4450 mL/24 hrs - NG/OG: 300 mL/24 hrs - drain: -112.5 mL/24 hrs GI Imaging: - 07/26/22 CT angio GI: possible extraluminal extension of gas from the proximal Roux limb just beyond the gastrojejunostomy. Moderate ascites. Diffuse bowel wall thickening involving the small bowel, possibly reflecting changes of peritonitis. No active gastrointestinal hemorrhage identified.  - 7/17 DG esophagus: No evidence for leak. Patent gastrojejunostomy. Residual pneumoperitoneum identified along the undersurface of the right hemidiaphragm. GI Surgeries / Procedures:  - 07/27/22: Exploratory laparotomy with Cheree Ditto patch closure of ulceration to the GJ anastomosis   Central access: PICC placed 07/27/22 TPN start date: 07/27/22  Nutritional Goals: Goal TPN rate is 80 mL/hr (provides 80 g of protein and 1613 kcals per day)  RD Assessment: Estimated Needs Total  Energy Estimated Needs: 1425-1700 Total Protein Estimated Needs: 70-85 g Total Fluid Estimated Needs: >1.7 L  Current Nutrition:  - 7/14 TPN >>  - thiamine 100 mg daily ordered by CCS  - 7/15: changed Folic acid to IV per RN's request  Plan:  At 1800:  - Continue TPN at goal rate of 80 mL/hr - Electrolytes in TPN:  Na 73mEq/L K 41mEq/L Ca 62mEq/L Mg 29mEq/L Phos 8mmol/L Cl:Ac 1:1 - Add standard MVI and trace elements to TPN - Continue sSSI q6h SSI and adjust as needed  - Continue D5NS at 80ml/hr - Monitor TPN labs on Mon/Thurs - CMET, mag and phos daily thru 7/18  Addendum: Pharmacy consulted to wean current TPN bag and discontinue TPN after administration of current bag is complete. Will adjust rate of TPN to 40 mL/hr now and discontinue TPN once administration of current bag is complete.    Cherylin Mylar, PharmD Clinical Pharmacist  7/18/20247:50 AM

## 2022-08-01 LAB — H. PYLORI ANTIGEN, STOOL: H. Pylori Stool Ag, Eia: NEGATIVE

## 2022-08-01 LAB — GLUCOSE, CAPILLARY
Glucose-Capillary: 103 mg/dL — ABNORMAL HIGH (ref 70–99)
Glucose-Capillary: 94 mg/dL (ref 70–99)
Glucose-Capillary: 98 mg/dL (ref 70–99)

## 2022-08-01 MED ORDER — THIAMINE MONONITRATE 100 MG PO TABS: 100.0000 mg | ORAL_TABLET | Freq: Every day | ORAL | Status: DC

## 2022-08-01 MED ORDER — PANTOPRAZOLE SODIUM 40 MG PO TBEC: 40.0000 mg | DELAYED_RELEASE_TABLET | Freq: Two times a day (BID) | ORAL | 5 refills | Status: DC

## 2022-08-01 MED ORDER — ALUM & MAG HYDROXIDE-SIMETH 200-200-20 MG/5ML PO SUSP: 30.0000 mL | Freq: Four times a day (QID) | ORAL | Status: DC | PRN

## 2022-08-01 MED ORDER — FOLIC ACID 1 MG PO TABS
1.0000 mg | ORAL_TABLET | Freq: Every day | ORAL | Status: DC
  Administered 2022-08-01: 1 mg via ORAL

## 2022-08-01 MED ORDER — THIAMINE HCL 100 MG/ML IJ SOLN: 100.0000 mg | Freq: Every day | INTRAMUSCULAR | Status: DC

## 2022-08-01 MED ORDER — SUCRALFATE 1 GM/10ML PO SUSP: 1.0000 g | Freq: Three times a day (TID) | ORAL | 0 refills | Status: DC

## 2022-08-01 NOTE — Plan of Care (Signed)
  Problem: Education: Goal: Knowledge of General Education information will improve Description: Including pain rating scale, medication(s)/side effects and non-pharmacologic comfort measures 08/01/2022 1630 by Amil Amen, RN Outcome: Adequate for Discharge 08/01/2022 1612 by Amil Amen, RN Outcome: Adequate for Discharge   Problem: Health Behavior/Discharge Planning: Goal: Ability to manage health-related needs will improve 08/01/2022 1630 by Amil Amen, RN Outcome: Adequate for Discharge 08/01/2022 1612 by Amil Amen, RN Outcome: Adequate for Discharge   Problem: Clinical Measurements: Goal: Ability to maintain clinical measurements within normal limits will improve 08/01/2022 1630 by Amil Amen, RN Outcome: Adequate for Discharge 08/01/2022 1612 by Amil Amen, RN Outcome: Adequate for Discharge Goal: Will remain free from infection 08/01/2022 1630 by Amil Amen, RN Outcome: Adequate for Discharge 08/01/2022 1612 by Amil Amen, RN Outcome: Adequate for Discharge Goal: Diagnostic test results will improve 08/01/2022 1630 by Amil Amen, RN Outcome: Adequate for Discharge 08/01/2022 1612 by Amil Amen, RN Outcome: Adequate for Discharge Goal: Respiratory complications will improve 08/01/2022 1630 by Amil Amen, RN Outcome: Adequate for Discharge 08/01/2022 1612 by Amil Amen, RN Outcome: Adequate for Discharge Goal: Cardiovascular complication will be avoided 08/01/2022 1630 by Amil Amen, RN Outcome: Adequate for Discharge 08/01/2022 1612 by Amil Amen, RN Outcome: Adequate for Discharge   Problem: Activity: Goal: Risk for activity intolerance will decrease 08/01/2022 1630 by Amil Amen, RN Outcome: Adequate for Discharge 08/01/2022 1612 by Amil Amen, RN Outcome: Adequate for Discharge   Problem: Nutrition: Goal: Adequate nutrition will be maintained 08/01/2022 1630 by Amil Amen, RN Outcome: Adequate for Discharge 08/01/2022 1612 by Amil Amen,  RN Outcome: Adequate for Discharge   Problem: Coping: Goal: Level of anxiety will decrease 08/01/2022 1630 by Amil Amen, RN Outcome: Adequate for Discharge 08/01/2022 1612 by Amil Amen, RN Outcome: Adequate for Discharge   Problem: Elimination: Goal: Will not experience complications related to bowel motility 08/01/2022 1630 by Amil Amen, RN Outcome: Adequate for Discharge 08/01/2022 1612 by Amil Amen, RN Outcome: Adequate for Discharge Goal: Will not experience complications related to urinary retention 08/01/2022 1630 by Amil Amen, RN Outcome: Adequate for Discharge 08/01/2022 1612 by Amil Amen, RN Outcome: Adequate for Discharge   Problem: Pain Managment: Goal: General experience of comfort will improve 08/01/2022 1630 by Amil Amen, RN Outcome: Adequate for Discharge 08/01/2022 1612 by Amil Amen, RN Outcome: Adequate for Discharge   Problem: Safety: Goal: Ability to remain free from injury will improve 08/01/2022 1630 by Amil Amen, RN Outcome: Adequate for Discharge 08/01/2022 1612 by Amil Amen, RN Outcome: Adequate for Discharge   Problem: Skin Integrity: Goal: Risk for impaired skin integrity will decrease 08/01/2022 1630 by Amil Amen, RN Outcome: Adequate for Discharge 08/01/2022 1612 by Amil Amen, RN Outcome: Adequate for Discharge
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
## 2022-08-01 NOTE — Plan of Care (Signed)
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
## 2022-08-01 NOTE — Discharge Summary (Signed)
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        Dialysis   Hypertension Maternal Grandmother    Heart disease Maternal Grandmother    Cancer Paternal Grandmother        Breast Cancer   Cancer Paternal Grandfather        colon   Colon cancer Paternal Grandfather    Diabetes Brother    Cancer Other        colon   Kidney failure Other        dialysis   Colon cancer Paternal Uncle    Esophageal cancer Neg Hx    Stomach cancer Neg Hx    Rectal cancer Neg Hx     Current Facility-Administered  Medications  Medication Dose Route Frequency Provider Last Rate Last Admin   acetaminophen (TYLENOL) tablet 650 mg  650 mg Oral Q6H Trixie Deis R, PA-C   650 mg at 08/01/22 1142   albuterol (PROVENTIL) (2.5 MG/3ML) 0.083% nebulizer solution 2.5 mg  2.5 mg Nebulization Q6H PRN Karie Soda, MD       alum & mag hydroxide-simeth (MAALOX/MYLANTA) 200-200-20 MG/5ML suspension 30 mL  30 mL Oral Q6H PRN Meuth, Brooke A, PA-C       bisacodyl (DULCOLAX) suppository 10 mg  10 mg Rectal Daily Karie Soda, MD   10 mg at 07/30/22 0948   diazepam (VALIUM)  injection 5-10 mg  5-10 mg Intravenous Q8H PRN Karie Soda, MD   10 mg at 07/30/22 1500   diphenhydrAMINE (BENADRYL) injection 12.5-25 mg  12.5-25 mg Intravenous Q6H PRN Karie Soda, MD   25 mg at 07/30/22 2055   docusate sodium (COLACE) capsule 100 mg  100 mg Oral BID Meuth, Brooke A, PA-C   100 mg at 08/01/22 1003   enoxaparin (LOVENOX) injection 40 mg  40 mg Subcutaneous Q24H Cornett, Maisie Fus, MD   40 mg at 07/31/22 2032   folic acid (FOLVITE) tablet 1 mg  1 mg Oral Daily Lucia Gaskins, RPH   1 mg at 08/01/22 1005   HYDROmorphone (DILAUDID) injection 0.5 mg  0.5 mg Intravenous Q6H PRN Meuth, Brooke A, PA-C   0.5 mg at 07/31/22 2344   insulin aspart (novoLOG) injection 0-9 Units  0-9 Units Subcutaneous Q6H Norva Pavlov, RPH   1 Units at 07/30/22 6578   magic mouthwash  15 mL Oral QID PRN Karie Soda, MD       menthol-cetylpyridinium (CEPACOL) lozenge 3 mg  1 lozenge Oral PRN Karie Soda, MD       methocarbamol (ROBAXIN) tablet 1,000 mg  1,000 mg Oral TID Juliet Rude, PA-C   1,000 mg at 08/01/22 1001   metoprolol tartrate (LOPRESSOR) injection 5 mg  5 mg Intravenous Q6H PRN Karie Soda, MD       naphazoline-glycerin (CLEAR EYES REDNESS) ophth solution 1-2 drop  1-2 drop Both Eyes QID PRN Karie Soda, MD       ondansetron [facility]) injection 4 mg  4 mg Intravenous Q6H PRN Karie Soda, MD   4 mg at 08/01/22 0015   Or   ondansetron  (ZOFRAN) 8 mg in sodium chloride 0.9 % 50 mL IVPB  8 mg Intravenous Q6H PRN Karie Soda, MD       ondansetron (ZOFRAN-ODT) disintegrating tablet 4 mg  4 mg Oral Q6H PRN Cornett, Maisie Fus, MD       oxyCODONE (Oxy IR/ROXICODONE) immediate release tablet 5-10 mg  5-10 mg Oral Q4H PRN Trixie Deis R, PA-C   10 mg at 08/01/22 1018   pantoprazole (PROTONIX) EC tablet 40 mg  40 mg Oral BID Meuth, Brooke A, PA-C   40 mg at 08/01/22 1002   phenol (CHLORASEPTIC) mouth spray 2 spray  2 spray Mouth/Throat Q2H PRN Karie Soda, MD       piperacillin-tazobactam (ZOSYN) IVPB 3.375 g  3.375 g Intravenous Q8H Cornett, Maisie Fus, MD 12.5 mL/hr at 08/01/22 0514 3.375 g at 08/01/22 0514   polyethylene glycol (MIRALAX / GLYCOLAX) packet 17 g  17 g Oral Daily Meuth, Brooke A, PA-C   17 g at 08/01/22 1001   prochlorperazine (COMPAZINE) injection 5-10 mg  5-10 mg Intravenous Q4H PRN Karie Soda, MD       simethicone (MYLICON) 40 MG/0.6ML suspension 80 mg  80 mg Oral QID PRN Karie Soda, MD       sodium chloride (OCEAN) 0.65 % nasal spray 1-2 spray  1-2 spray Each Nare Q6H PRN Karie Soda, MD       sodium chloride flush (NS) 0.9 % injection 10-40 mL  10-40 mL Intracatheter PRN Cornett, Thomas, MD       sucralfate (CARAFATE) 1 GM/10ML suspension 1 g  1 g Oral TID WC & HS Trixie Deis R, PA-C   1 g at 08/01/22 1142   [START ON 08/02/2022] thiamine (VITAMIN B1) tablet 100 mg  100 mg Oral Daily Meuth, Brooke A, PA-C  Or   [START ON 08/02/2022] thiamine (VITAMIN B1) injection 100 mg  100 mg Intravenous Daily Meuth, Brooke A, PA-C         Allergies  Allergen Reactions   Nsaids Other (See Comments)    Perforated ulcers   Tramadol Rash and Other (See Comments)    "Triggers migraines" also   Oxycodone Itching and Other (See Comments)    SEVERE itching with Percocet    Wellbutrin [Bupropion] Hives    Signed: Hosie Spangle, PA-C [facility] Please see Amion for pager number during day hours  7:00am-4:30pm   I was not personally involved in the care of this patient so the above information was obtained entirely from chart review. My attending physician, Dr. Michaell Cowing, examined the patient on the day of discharge.   [facility] [address], [address] [place], [place] [address] [phone] Fax [phone] Main    08/01/2022, 12:13 PM

## 2022-08-01 NOTE — TOC Transition Note (Addendum)
Transition of Care [facility]) - CM/SW Discharge Note   Patient Details  Name: Tina [name] MRN: [id number] Date of Birth: [birth year]  [facility] CM/SW Contact:  Adrian Prows, RN Phone Number: 08/01/2022, 1:03 PM   Clinical Narrative:    HH RN and D/C orders received; pt says she is not going to use services at [facility]; pt says she has safe d/c plan and she will need transportation to her car; pt verified d/c address [address] [place] [address]; taxi voucher for [employer] given to [place], Charity fundraiser and she will call for transport; Therapist, nutritional and Release of Liability signed by pt, and placed in shadow chart; pt also agrees to [facility] service; she does not have agency preference; per [facility]- no; Lynette at [place], awaiting response; [facility] awaiting response, Cory at [place]- no.; Amy at [place], no.  -1339- spoke w/ Haywood Lasso at [facility]; she says still waiting on approval.  -1345- call back from [facility] [place]; unable to accept, out of network.  -1401- notified by Haywood Lasso at [facility] unable to accept.  -1420- notified unable to secure [facility] d/t insurance; pt says she can do the dressing; only other option is Outpatient Wound Care clinic, but she will need to be seen by [facility] while here in hospital; E. Simaan, PA via secure chat; awaiting response.  -1427- pt notified and says" this is not my first time at the [facility].  This is my 3rd surgery. I have done the dressings before". notified by E. Simaan ok for pt to complete daily dressing change; bedside RN will provide education; no TOC needs. Final next level of care: Home w Home Health Services Barriers to Discharge: No Barriers Identified   Patient Goals and CMS Choice   Choice offered to / list presented to : NA  Discharge Placement                         Discharge Plan and Services Additional resources added to the After Visit Summary for   In-house Referral: Clinical Social  Work   Post Acute Care Choice: NA          DME Arranged: N/A DME Agency: NA       HH Arranged: RN          Social Determinants of Health (SDOH) Interventions SDOH Screenings   Food Insecurity: No Food Insecurity (07/31/2022)  Housing: Patient Declined (08/01/2022)  Transportation Needs: Unmet Transportation Needs (07/31/2022)  Utilities: Not At Risk (07/31/2022)  Financial Resource Strain: Patient Declined (04/02/2022)   Received from [facility], [facility]  Physical Activity: Sufficiently Active (01/13/2021)   Received from [facility]  Social Connections: Unknown (05/27/2021)   Received from [facility], [facility]  Stress: Stress Concern Present (09/01/2021)   Received from [facility], [facility]  Tobacco Use: Medium Risk (07/26/2022)     Readmission Risk Interventions     No data to display

## 2022-08-04 ENCOUNTER — Emergency Department (HOSPITAL_COMMUNITY)
Admission: EM | Admit: 2022-08-04 | Discharge: 2022-08-04 | Disposition: A | Discharge status: Home / Self Care | Payer: Medicaid Other | Source: Home / Self Care | Attending: Emergency Medicine | Admitting: Emergency Medicine

## 2022-08-04 ENCOUNTER — Encounter (HOSPITAL_COMMUNITY): Payer: Self-pay

## 2022-08-04 ENCOUNTER — Other Ambulatory Visit: Payer: Self-pay

## 2022-08-04 DIAGNOSIS — Z5189 Encounter for other specified aftercare: Secondary | ICD-10-CM

## 2022-08-04 DIAGNOSIS — Z87891 Personal history of nicotine dependence: Secondary | ICD-10-CM | POA: Insufficient documentation

## 2022-08-04 DIAGNOSIS — Z4800 Encounter for change or removal of nonsurgical wound dressing: Secondary | ICD-10-CM | POA: Insufficient documentation

## 2022-08-04 LAB — COMPREHENSIVE METABOLIC PANEL
ALT: 27 U/L (ref 0–44)
AST: 25 U/L (ref 15–41)
Albumin: 3.2 g/dL — ABNORMAL LOW (ref 3.5–5.0)
Alkaline Phosphatase: 61 U/L (ref 38–126)
Anion gap: 8 (ref 5–15)
BUN: 20 mg/dL (ref 6–20)
CO2: 27 mmol/L (ref 22–32)
Calcium: 8.5 mg/dL — ABNORMAL LOW (ref 8.9–10.3)
Chloride: 98 mmol/L (ref 98–111)
Creatinine, Ser: 0.55 mg/dL (ref 0.44–1.00)
GFR, Estimated: >60 mL/min (ref >60)
Glucose, Bld: 86 mg/dL (ref 70–99)
Potassium: 3.8 mmol/L (ref 3.5–5.1)
Sodium: 133 mmol/L — ABNORMAL LOW (ref 135–145)
Total Bilirubin: 0.4 mg/dL (ref 0.3–1.2)
Total Protein: 7.3 g/dL (ref 6.5–8.1)

## 2022-08-04 LAB — CBC
HCT: 34.4 % — ABNORMAL LOW (ref 36.0–46.0)
Hemoglobin: 10.2 g/dL — ABNORMAL LOW (ref 12.0–15.0)
MCH: 23 pg — ABNORMAL LOW (ref 26.0–34.0)
MCHC: 29.7 g/dL — ABNORMAL LOW (ref 30.0–36.0)
MCV: 77.5 fL — ABNORMAL LOW (ref 80.0–100.0)
Platelets: 428 10*3/uL — ABNORMAL HIGH (ref 150–400)
RBC: 4.44 MIL/uL (ref 3.87–5.11)
RDW: 16.1 % — ABNORMAL HIGH (ref 11.5–15.5)
WBC: 7.6 10*3/uL (ref 4.0–10.5)
nRBC: 0 % (ref 0.0–0.2)

## 2022-08-04 MED ORDER — SODIUM CHLORIDE 0.9 % IV BOLUS
1000.0000 mL | Freq: Once | INTRAVENOUS | Status: AC
  Administered 2022-08-04: 1000 mL via INTRAVENOUS

## 2022-08-04 MED ORDER — ONDANSETRON HCL 4 MG/2ML IJ SOLN
4.0000 mg | Freq: Once | INTRAMUSCULAR | Status: AC
  Administered 2022-08-04: 4 mg via INTRAVENOUS
  Filled 2022-08-04: qty 2

## 2022-08-04 NOTE — Discharge Instructions (Signed)
You were evaluated in the Emergency Department and after careful evaluation, we did not find any emergent condition requiring admission or further testing in the hospital.  Your exam/testing today was overall reassuring.  Continue wet-to-dry dressings at home.  Please return to the Emergency Department if you experience any worsening of your condition.  Thank you for allowing [place] to be a part of your care.
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
## 2022-08-04 NOTE — ED Triage Notes (Signed)
Pt states that she had abdominal surgery last week and is unable to do appropriate dressing changes so wound is currently open.

## 2022-08-25 ENCOUNTER — Other Ambulatory Visit: Payer: Self-pay | Admitting: Nurse Practitioner

## 2022-08-25 DIAGNOSIS — E039 Hypothyroidism, unspecified: Secondary | ICD-10-CM

## 2022-12-19 ENCOUNTER — Emergency Department (HOSPITAL_COMMUNITY): Payer: Medicaid Other

## 2022-12-19 ENCOUNTER — Emergency Department (HOSPITAL_COMMUNITY)
Admission: EM | Admit: 2022-12-19 | Discharge: 2022-12-19 | Disposition: A | Discharge status: Home / Self Care | Payer: Medicaid Other | Attending: Emergency Medicine | Admitting: Emergency Medicine

## 2022-12-19 ENCOUNTER — Other Ambulatory Visit: Payer: Self-pay

## 2022-12-19 ENCOUNTER — Encounter (HOSPITAL_COMMUNITY): Payer: Self-pay

## 2022-12-19 DIAGNOSIS — D649 Anemia, unspecified: Secondary | ICD-10-CM | POA: Insufficient documentation

## 2022-12-19 DIAGNOSIS — M546 Pain in thoracic spine: Secondary | ICD-10-CM | POA: Insufficient documentation

## 2022-12-19 DIAGNOSIS — K4090 Unilateral inguinal hernia, without obstruction or gangrene, not specified as recurrent: Secondary | ICD-10-CM | POA: Insufficient documentation

## 2022-12-19 DIAGNOSIS — R1012 Left upper quadrant pain: Secondary | ICD-10-CM | POA: Diagnosis present

## 2022-12-19 LAB — COMPREHENSIVE METABOLIC PANEL
ALT: 41 U/L (ref 0–44)
AST: 29 U/L (ref 15–41)
Albumin: 3.5 g/dL (ref 3.5–5.0)
Alkaline Phosphatase: 85 U/L (ref 38–126)
Anion gap: 10 (ref 5–15)
BUN: 19 mg/dL (ref 6–20)
CO2: 24 mmol/L (ref 22–32)
Calcium: 8.3 mg/dL — ABNORMAL LOW (ref 8.9–10.3)
Chloride: 103 mmol/L (ref 98–111)
Creatinine, Ser: <0.3 mg/dL — ABNORMAL LOW (ref 0.44–1.00)
Glucose, Bld: 89 mg/dL (ref 70–99)
Potassium: 3.9 mmol/L (ref 3.5–5.1)
Sodium: 137 mmol/L (ref 135–145)
Total Bilirubin: 0.5 mg/dL (ref <1.2)
Total Protein: 6.5 g/dL (ref 6.5–8.1)

## 2022-12-19 LAB — CBC
HCT: 28.2 % — ABNORMAL LOW (ref 36.0–46.0)
Hemoglobin: 7.9 g/dL — ABNORMAL LOW (ref 12.0–15.0)
MCH: 19.5 pg — ABNORMAL LOW (ref 26.0–34.0)
MCHC: 28 g/dL — ABNORMAL LOW (ref 30.0–36.0)
MCV: 69.6 fL — ABNORMAL LOW (ref 80.0–100.0)
Platelets: 434 10*3/uL — ABNORMAL HIGH (ref 150–400)
RBC: 4.05 MIL/uL (ref 3.87–5.11)
RDW: 19.7 % — ABNORMAL HIGH (ref 11.5–15.5)
WBC: 6.5 10*3/uL (ref 4.0–10.5)
nRBC: 0 % (ref 0.0–0.2)

## 2022-12-19 LAB — LIPASE, BLOOD: Lipase: 48 U/L (ref 11–51)

## 2022-12-19 LAB — POC OCCULT BLOOD, ED: Fecal Occult Bld: NEGATIVE

## 2022-12-19 LAB — HCG, QUANTITATIVE, PREGNANCY: hCG, Beta Chain, Quant, S: 1 m[IU]/mL (ref <5)

## 2022-12-19 MED ORDER — ACETAMINOPHEN 325 MG PO TABS
650.0000 mg | ORAL_TABLET | Freq: Once | ORAL | Status: AC
  Administered 2022-12-19: 650 mg via ORAL
  Filled 2022-12-19: qty 2

## 2022-12-19 MED ORDER — IOHEXOL 300 MG/ML  SOLN
100.0000 mL | Freq: Once | INTRAMUSCULAR | Status: AC | PRN
  Administered 2022-12-19: 100 mL via INTRAVENOUS

## 2022-12-19 NOTE — Discharge Instructions (Addendum)
Thank you for letting us evaluate you today.  Your hemoglobin was low.  Please make sure to follow-up with a provider from, community [facility] wellness for anemia panel and further management.  In the meantime, you need to eat iron rich foods or pick up over the counter iron supplements. Maintain hydration with water and gatorade  Please return to ED if you experience loss of consciousness, seizures, worsening of symptoms.

## 2022-12-19 NOTE — ED Triage Notes (Signed)
Patient reports left flank pain and RLQ pain x 1 week. Denies nausea, vomiting, and urinary symptoms.  

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
## 2022-12-20 ENCOUNTER — Other Ambulatory Visit (HOSPITAL_COMMUNITY)
Admission: EM | Admit: 2022-12-20 | Discharge: 2022-12-20 | Disposition: A | Discharge status: Home / Self Care | Payer: Medicaid Other | Attending: Behavioral Health | Admitting: Behavioral Health

## 2022-12-20 ENCOUNTER — Other Ambulatory Visit (HOSPITAL_COMMUNITY)
Admission: EM | Admit: 2022-12-20 | Discharge: 2022-12-23 | Disposition: A | Discharge status: Other Acute Inpatient Hospital | Payer: Medicaid Other | Attending: Behavioral Health | Admitting: Psychiatry

## 2022-12-20 DIAGNOSIS — D509 Iron deficiency anemia, unspecified: Secondary | ICD-10-CM | POA: Diagnosis present

## 2022-12-20 DIAGNOSIS — Z9884 Bariatric surgery status: Secondary | ICD-10-CM

## 2022-12-20 DIAGNOSIS — F1520 Other stimulant dependence, uncomplicated: Secondary | ICD-10-CM | POA: Insufficient documentation

## 2022-12-20 DIAGNOSIS — D500 Iron deficiency anemia secondary to blood loss (chronic): Secondary | ICD-10-CM | POA: Diagnosis present

## 2022-12-20 DIAGNOSIS — F1920 Other psychoactive substance dependence, uncomplicated: Secondary | ICD-10-CM | POA: Diagnosis present

## 2022-12-20 DIAGNOSIS — F1021 Alcohol dependence, in remission: Secondary | ICD-10-CM | POA: Insufficient documentation

## 2022-12-20 DIAGNOSIS — E785 Hyperlipidemia, unspecified: Secondary | ICD-10-CM | POA: Insufficient documentation

## 2022-12-20 DIAGNOSIS — E43 Unspecified severe protein-calorie malnutrition: Secondary | ICD-10-CM | POA: Insufficient documentation

## 2022-12-20 DIAGNOSIS — F1914 Other psychoactive substance abuse with psychoactive substance-induced mood disorder: Secondary | ICD-10-CM | POA: Diagnosis not present

## 2022-12-20 DIAGNOSIS — F909 Attention-deficit hyperactivity disorder, unspecified type: Secondary | ICD-10-CM | POA: Insufficient documentation

## 2022-12-20 DIAGNOSIS — F1994 Other psychoactive substance use, unspecified with psychoactive substance-induced mood disorder: Secondary | ICD-10-CM

## 2022-12-20 DIAGNOSIS — F1120 Opioid dependence, uncomplicated: Secondary | ICD-10-CM | POA: Insufficient documentation

## 2022-12-20 DIAGNOSIS — F4310 Post-traumatic stress disorder, unspecified: Secondary | ICD-10-CM | POA: Insufficient documentation

## 2022-12-20 DIAGNOSIS — F32A Depression, unspecified: Secondary | ICD-10-CM | POA: Diagnosis not present

## 2022-12-20 DIAGNOSIS — F1221 Cannabis dependence, in remission: Secondary | ICD-10-CM | POA: Insufficient documentation

## 2022-12-20 DIAGNOSIS — F1290 Cannabis use, unspecified, uncomplicated: Secondary | ICD-10-CM | POA: Diagnosis present

## 2022-12-20 DIAGNOSIS — F322 Major depressive disorder, single episode, severe without psychotic features: Secondary | ICD-10-CM | POA: Diagnosis present

## 2022-12-20 DIAGNOSIS — Z5902 Unsheltered homelessness: Secondary | ICD-10-CM | POA: Insufficient documentation

## 2022-12-20 DIAGNOSIS — F411 Generalized anxiety disorder: Secondary | ICD-10-CM | POA: Insufficient documentation

## 2022-12-20 DIAGNOSIS — R45851 Suicidal ideations: Secondary | ICD-10-CM | POA: Diagnosis not present

## 2022-12-20 DIAGNOSIS — Z5900 Homelessness unspecified: Secondary | ICD-10-CM | POA: Insufficient documentation

## 2022-12-20 DIAGNOSIS — F1910 Other psychoactive substance abuse, uncomplicated: Secondary | ICD-10-CM | POA: Insufficient documentation

## 2022-12-20 DIAGNOSIS — K285 Chronic or unspecified gastrojejunal ulcer with perforation: Secondary | ICD-10-CM | POA: Diagnosis present

## 2022-12-20 DIAGNOSIS — F332 Major depressive disorder, recurrent severe without psychotic features: Secondary | ICD-10-CM

## 2022-12-20 DIAGNOSIS — F4010 Social phobia, unspecified: Secondary | ICD-10-CM | POA: Diagnosis present

## 2022-12-20 DIAGNOSIS — F1990 Other psychoactive substance use, unspecified, uncomplicated: Secondary | ICD-10-CM | POA: Diagnosis present

## 2022-12-20 LAB — COMPREHENSIVE METABOLIC PANEL
ALT: 33 U/L (ref 0–44)
AST: 24 U/L (ref 15–41)
Albumin: 3.1 g/dL — ABNORMAL LOW (ref 3.5–5.0)
Alkaline Phosphatase: 78 U/L (ref 38–126)
Anion gap: 9 (ref 5–15)
BUN: 11 mg/dL (ref 6–20)
CO2: 26 mmol/L (ref 22–32)
Calcium: 8.4 mg/dL — ABNORMAL LOW (ref 8.9–10.3)
Chloride: 103 mmol/L (ref 98–111)
Creatinine, Ser: 0.44 mg/dL (ref 0.44–1.00)
GFR, Estimated: >60 mL/min (ref >60)
Glucose, Bld: 77 mg/dL (ref 70–99)
Potassium: 4.6 mmol/L (ref 3.5–5.1)
Sodium: 138 mmol/L (ref 135–145)
Total Bilirubin: 0.3 mg/dL (ref <1.2)
Total Protein: 5.9 g/dL — ABNORMAL LOW (ref 6.5–8.1)

## 2022-12-20 LAB — CBC WITH DIFFERENTIAL/PLATELET
Abs Immature Granulocytes: 0 10*3/uL (ref 0.00–0.07)
Basophils Absolute: 0.1 10*3/uL (ref 0.0–0.1)
Basophils Relative: 1 %
Eosinophils Absolute: 0.1 10*3/uL (ref 0.0–0.5)
Eosinophils Relative: 1 %
HCT: 28.1 % — ABNORMAL LOW (ref 36.0–46.0)
Hemoglobin: 8.1 g/dL — ABNORMAL LOW (ref 12.0–15.0)
Lymphocytes Relative: 28 %
Lymphs Abs: 1.7 10*3/uL (ref 0.7–4.0)
MCH: 19.7 pg — ABNORMAL LOW (ref 26.0–34.0)
MCHC: 28.8 g/dL — ABNORMAL LOW (ref 30.0–36.0)
MCV: 68.2 fL — ABNORMAL LOW (ref 80.0–100.0)
Monocytes Absolute: 0 10*3/uL — ABNORMAL LOW (ref 0.1–1.0)
Monocytes Relative: 0 %
Neutro Abs: 4.3 10*3/uL (ref 1.7–7.7)
Neutrophils Relative %: 70 %
Platelets: 444 10*3/uL — ABNORMAL HIGH (ref 150–400)
RBC: 4.12 MIL/uL (ref 3.87–5.11)
RDW: 19.8 % — ABNORMAL HIGH (ref 11.5–15.5)
WBC: 6.2 10*3/uL (ref 4.0–10.5)
nRBC: 0 % (ref 0.0–0.2)
nRBC: 0 /100{WBCs}

## 2022-12-20 LAB — ETHANOL: Alcohol, Ethyl (B): <10 mg/dL (ref <10)

## 2022-12-20 LAB — MAGNESIUM: Magnesium: 2.3 mg/dL (ref 1.7–2.4)

## 2022-12-20 LAB — TSH: TSH: 1.12 u[IU]/mL (ref 0.350–4.500)

## 2022-12-20 LAB — HEMOGLOBIN A1C
Hgb A1c MFr Bld: 5.7 % — ABNORMAL HIGH (ref 4.8–5.6)
Mean Plasma Glucose: 116.89 mg/dL

## 2022-12-20 MED ORDER — ACETAMINOPHEN 325 MG PO TABS
650.0000 mg | ORAL_TABLET | Freq: Four times a day (QID) | ORAL | Status: DC | PRN
  Administered 2022-12-21 – 2022-12-23 (×4): 650 mg via ORAL
  Filled 2022-12-20 (×4): qty 2

## 2022-12-20 MED ORDER — HYDROXYZINE HCL 25 MG PO TABS: 25.0000 mg | ORAL_TABLET | Freq: Three times a day (TID) | ORAL | Status: DC | PRN

## 2022-12-20 MED ORDER — ZIPRASIDONE MESYLATE 20 MG IM SOLR: 20.0000 mg | INTRAMUSCULAR | Status: DC | PRN

## 2022-12-20 MED ORDER — LORAZEPAM 1 MG PO TABS: 1.0000 mg | ORAL_TABLET | ORAL | Status: DC | PRN

## 2022-12-20 MED ORDER — TRAZODONE HCL 50 MG PO TABS
50.0000 mg | ORAL_TABLET | Freq: Every evening | ORAL | Status: DC | PRN
  Administered 2022-12-20: 50 mg via ORAL
  Filled 2022-12-20: qty 1

## 2022-12-20 MED ORDER — TRAZODONE HCL 50 MG PO TABS: 50.0000 mg | ORAL_TABLET | Freq: Every evening | ORAL | Status: DC | PRN

## 2022-12-20 MED ORDER — ACETAMINOPHEN 325 MG PO TABS: 650.0000 mg | ORAL_TABLET | Freq: Four times a day (QID) | ORAL | Status: DC | PRN

## 2022-12-20 MED ORDER — MAGNESIUM HYDROXIDE 400 MG/5ML PO SUSP: 30.0000 mL | Freq: Every day | ORAL | Status: DC | PRN

## 2022-12-20 MED ORDER — OLANZAPINE 5 MG PO TBDP: 5.0000 mg | ORAL_TABLET | Freq: Three times a day (TID) | ORAL | Status: DC | PRN

## 2022-12-20 MED ORDER — HYDROXYZINE HCL 25 MG PO TABS
25.0000 mg | ORAL_TABLET | Freq: Three times a day (TID) | ORAL | Status: DC | PRN
  Administered 2022-12-20 – 2022-12-22 (×3): 25 mg via ORAL
  Filled 2022-12-20: qty 1
  Filled 2022-12-20: qty 20
  Filled 2022-12-20 (×2): qty 1

## 2022-12-20 MED ORDER — ALUM & MAG HYDROXIDE-SIMETH 200-200-20 MG/5ML PO SUSP: 30.0000 mL | ORAL | Status: DC | PRN

## 2022-12-20 MED ORDER — ALUM & MAG HYDROXIDE-SIMETH 200-200-20 MG/5ML PO SUSP
30.0000 mL | ORAL | Status: DC | PRN
  Administered 2022-12-20 – 2022-12-21 (×2): 30 mL via ORAL
  Filled 2022-12-20 (×2): qty 30

## 2022-12-20 NOTE — ED Notes (Signed)
Pt A&O x 4, sitting up in room, no distress noted, calm & cooperative.  Pleasant.  Monitoring for safety.

## 2022-12-20 NOTE — ED Notes (Signed)
Patient admitted to [facility] from [facility] for detox from methamphetamine use.  Patient denies any other substance except THC.  She is calm and cooperative with admission process.  She was oriented to unit and shown to her room.  Patient ate dinner on [place].  She appears very thin.  She is now on the phone attempting to reach her son as she wants him to bring her some clothes as she had not planned on being admitted.  Will monitor.  Denies avh shi or plan.

## 2022-12-20 NOTE — Group Note (Signed)
Group Topic: Relapse and Recovery  Group Date: 12/20/2022 Start Time: 2000 End Time: 2100 Facilitators: Guss Bunde  Department: [facility]  Number of Participants: 4  Group Focus: co-dependency Treatment Modality:  Spiritual Interventions utilized were patient education Purpose: relapse prevention strategies  Name: Tina [name] Date of Birth: 11-09-1980  MR: [id number]    Level of Participation: active Quality of Participation: engaged Interactions with others: Speaker from AA group was facilitator Mood/Affect: appropriate Triggers (if applicable):  Cognition: goal directed Progress: Gaining insight Response:  Plan: patient will be encouraged to follow up with sobriety goals  Patients Problems:  Patient Active Problem List   Diagnosis Date Noted   Substance dependence with physiological dependence (HCC) 12/20/2022   Substance use disorder 12/20/2022   Substance abuse (HCC) 12/20/2022   Protein-calorie malnutrition, severe 07/29/2022   Gastrojejunal ulcer with recurrent perforation 07/28/2022   Alcohol dependence (HCC) 07/28/2022   Peritonitis (HCC) 07/27/2022   Sciatica 06/03/2022   Splenic rupture 05/26/2022   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   Perforated ulcer (HCC) 11/26/2021   Perforated viscus 09/01/2021   Major depressive disorder with single episode, in partial remission (HCC) 03/16/2017   Iron deficiency anemia due to chronic blood loss 11/18/2016   Migraines 11/07/2016   Thyroid nodule 06/06/2016   S/P cesarean section 08/29/2014   Venereal disease 06/23/2012   Cervicalgia 04/21/2012   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   Postlaminectomy syndrome, lumbar region 06/26/2011   Lumbago 05/09/2011   DJD (degenerative joint disease), lumbar 05/08/2011   Seasonal and perennial allergic rhinitis 05/08/2011   Sacroiliac dysfunction 04/07/2011   Lumbosacral spondylosis without myelopathy 03/10/2011   Dizziness and  giddiness 11/02/2010   Preventative health care 04/26/2010   SMOKER 03/29/2010   HYPERSOMNIA 02/08/2010   HYPERLIPIDEMIA 04/26/2009   Attention deficit disorder 04/26/2009   Pruritus ani 04/26/2009   ANXIETY 02/01/2009   DEPRESSION 02/01/2009   COMMON MIGRAINE 02/01/2009   GERD 02/01/2009   CHOLELITHIASIS 02/01/2009
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
## 2022-12-20 NOTE — Progress Notes (Signed)
Pt transferred to [facility]. 
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
## 2022-12-21 ENCOUNTER — Encounter (HOSPITAL_COMMUNITY): Payer: Self-pay | Admitting: Psychiatry

## 2022-12-21 DIAGNOSIS — R45851 Suicidal ideations: Secondary | ICD-10-CM | POA: Diagnosis not present

## 2022-12-21 DIAGNOSIS — F1914 Other psychoactive substance abuse with psychoactive substance-induced mood disorder: Secondary | ICD-10-CM | POA: Diagnosis not present

## 2022-12-21 DIAGNOSIS — F411 Generalized anxiety disorder: Secondary | ICD-10-CM | POA: Diagnosis not present

## 2022-12-21 DIAGNOSIS — F322 Major depressive disorder, single episode, severe without psychotic features: Secondary | ICD-10-CM | POA: Diagnosis present

## 2022-12-21 DIAGNOSIS — F32A Depression, unspecified: Secondary | ICD-10-CM | POA: Diagnosis not present

## 2022-12-21 DIAGNOSIS — D509 Iron deficiency anemia, unspecified: Secondary | ICD-10-CM | POA: Diagnosis present

## 2022-12-21 DIAGNOSIS — F1021 Alcohol dependence, in remission: Secondary | ICD-10-CM

## 2022-12-21 DIAGNOSIS — F1120 Opioid dependence, uncomplicated: Secondary | ICD-10-CM | POA: Insufficient documentation

## 2022-12-21 DIAGNOSIS — F1290 Cannabis use, unspecified, uncomplicated: Secondary | ICD-10-CM

## 2022-12-21 DIAGNOSIS — F4010 Social phobia, unspecified: Secondary | ICD-10-CM | POA: Diagnosis present

## 2022-12-21 DIAGNOSIS — F4310 Post-traumatic stress disorder, unspecified: Secondary | ICD-10-CM | POA: Diagnosis present

## 2022-12-21 HISTORY — DX: Alcohol dependence, in remission: F10.21

## 2022-12-21 HISTORY — DX: Cannabis use, unspecified, uncomplicated: F12.90

## 2022-12-21 HISTORY — DX: Hypocalcemia: E83.51

## 2022-12-21 HISTORY — DX: Iron deficiency anemia, unspecified: D50.9

## 2022-12-21 MED ORDER — POLYETHYLENE GLYCOL 3350 17 G PO PACK: 17.0000 g | PACK | Freq: Every day | ORAL | Status: DC | PRN

## 2022-12-21 MED ORDER — ADULT MULTIVITAMIN W/MINERALS CH
1.0000 | ORAL_TABLET | Freq: Every day | ORAL | Status: DC
  Administered 2022-12-21 – 2022-12-22 (×2): 1 via ORAL
  Filled 2022-12-21 (×2): qty 1

## 2022-12-21 MED ORDER — VITAMIN C 500 MG PO TABS: 500.0000 mg | ORAL_TABLET | Freq: Every day | ORAL | Status: DC

## 2022-12-21 MED ORDER — FE FUM-VIT C-VIT B12-FA 460-60-0.01-1 MG PO CAPS: 1.0000 | ORAL_CAPSULE | Freq: Every day | ORAL | Status: DC

## 2022-12-21 MED ORDER — ENSURE ENLIVE PO LIQD
237.0000 mL | Freq: Two times a day (BID) | ORAL | Status: DC
  Administered 2022-12-21 – 2022-12-22 (×3): 237 mL via ORAL

## 2022-12-21 MED ORDER — THIAMINE HCL 100 MG/ML IJ SOLN
100.0000 mg | Freq: Once | INTRAMUSCULAR | Status: AC
  Administered 2022-12-21: 100 mg via INTRAMUSCULAR
  Filled 2022-12-21: qty 2

## 2022-12-21 MED ORDER — VITAMIN B-12 100 MCG PO TABS
100.0000 ug | ORAL_TABLET | Freq: Every day | ORAL | Status: DC
  Administered 2022-12-21: 100 ug via ORAL
  Filled 2022-12-21 (×2): qty 1

## 2022-12-21 MED ORDER — THIAMINE MONONITRATE 100 MG PO TABS
100.0000 mg | ORAL_TABLET | Freq: Every day | ORAL | Status: DC
  Administered 2022-12-21 – 2022-12-22 (×2): 100 mg via ORAL
  Filled 2022-12-21 (×3): qty 1

## 2022-12-21 MED ORDER — TAB-A-VITE/IRON PO TABS: 1.0000 | ORAL_TABLET | Freq: Every day | ORAL | Status: DC

## 2022-12-21 MED ORDER — FERROUS SULFATE 325 (65 FE) MG PO TABS
325.0000 mg | ORAL_TABLET | Freq: Every day | ORAL | Status: DC
  Administered 2022-12-22: 325 mg via ORAL
  Filled 2022-12-21: qty 1

## 2022-12-21 MED ORDER — MIRTAZAPINE 15 MG PO TABS
15.0000 mg | ORAL_TABLET | Freq: Every day | ORAL | Status: DC
Start: 2022-12-21 — End: 2022-12-23
  Administered 2022-12-21 – 2022-12-22 (×2): 15 mg via ORAL
  Filled 2022-12-21: qty 14
  Filled 2022-12-21 (×2): qty 1

## 2022-12-21 NOTE — ED Notes (Signed)
Pt sleeping at present, no distress noted.  Monitoring for safety. 

## 2022-12-21 NOTE — ED Notes (Signed)
Pt is in the Dayroom watching TV with other patients . Composed and pleasant. NAD.Respirations even and unlabored. Will continue to monitor for safety.

## 2022-12-21 NOTE — Group Note (Signed)
Group Topic: Wellness  Group Date: 12/21/2022 Start Time: 1600 End Time: 1630 Facilitators: Dickie La, RN  Department: [facility]  Number of Participants: 4  Group Focus: coping skills, goals/reality orientation, and self-awareness Treatment Modality:  Psychoeducation Interventions utilized were exploration and patient education Purpose: increase insight  Name: Tina [name] Date of Birth: September 08, 1980  MR: [id number]    Level of Participation: Pt did not attend group. Quality of Participation:  Interactions with others:  Mood/Affect:  Triggers (if applicable):  Cognition:  Progress:  Response: Plan: follow-up needed  Patients Problems:  Patient Active Problem List   Diagnosis Date Noted   Methamphetamine use disorder, severe (HCC) 12/21/2022   Alcohol use disorder, severe, in early remission (HCC) 12/21/2022   Cannabis use disorder 12/21/2022   Opioid use disorder, moderate, dependence (HCC) 12/21/2022   Substance dependence with physiological dependence (HCC) 12/20/2022   Substance use disorder 12/20/2022   Substance abuse (HCC) 12/20/2022   Protein-calorie malnutrition, severe 07/29/2022   Gastrojejunal ulcer with recurrent perforation 07/28/2022   Alcohol dependence (HCC) 07/28/2022   Peritonitis (HCC) 07/27/2022   Sciatica 06/03/2022   Splenic rupture 05/26/2022   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   Perforated ulcer (HCC) 11/26/2021   Perforated viscus 09/01/2021   Major depressive disorder with single episode, in partial remission (HCC) 03/16/2017   Iron deficiency anemia due to chronic blood loss 11/18/2016   Migraines 11/07/2016   Thyroid nodule 06/06/2016   S/P cesarean section 08/29/2014   Venereal disease 06/23/2012   Cervicalgia 04/21/2012   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   Postlaminectomy syndrome, lumbar region 06/26/2011   Lumbago 05/09/2011   DJD (degenerative joint disease), lumbar 05/08/2011    Seasonal and perennial allergic rhinitis 05/08/2011   Sacroiliac dysfunction 04/07/2011   Lumbosacral spondylosis without myelopathy 03/10/2011   Dizziness and giddiness 11/02/2010   Preventative health care 04/26/2010   SMOKER 03/29/2010   HYPERSOMNIA 02/08/2010   HYPERLIPIDEMIA 04/26/2009   Attention deficit disorder 04/26/2009   Pruritus ani 04/26/2009   ANXIETY 02/01/2009   DEPRESSION 02/01/2009   COMMON MIGRAINE 02/01/2009   GERD 02/01/2009   CHOLELITHIASIS 02/01/2009

## 2022-12-21 NOTE — Group Note (Signed)
Group Topic: Fears and Unhealthy Coping Skills  Group Date: 12/21/2022 Start Time: 1500 End Time: 1530 Facilitators: Vonzell Schlatter B  Department: [facility]  Number of Participants: 4  Group Focus: coping skills and daily focus Treatment Modality:  Psychoeducation Interventions utilized were problem solving and support Purpose: express feelings and regain self-worth  Name: Tina [name] Date of Birth: 1980-11-05  MR: [id number]    Level of Participation: Pt did not attend group Quality of Participation:  Interactions with others:  Mood/Affect:  Triggers (if applicable):  Cognition:  Progress:  Response:  Plan: follow-up needed  Patients Problems:  Patient Active Problem List   Diagnosis Date Noted  . Methamphetamine use disorder, severe (HCC) 12/21/2022  . Alcohol use disorder, severe, in early remission (HCC) 12/21/2022  . Cannabis use disorder 12/21/2022  . Opioid use disorder, moderate, dependence (HCC) 12/21/2022  . Substance dependence with physiological dependence (HCC) 12/20/2022  . Substance use disorder 12/20/2022  . Substance abuse (HCC) 12/20/2022  . Protein-calorie malnutrition, severe 07/29/2022  . Gastrojejunal ulcer with recurrent perforation 07/28/2022  . Alcohol dependence (HCC) 07/28/2022  . Peritonitis (HCC) 07/27/2022  . Sciatica 06/03/2022  . Splenic rupture 05/26/2022  . 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 Perforated ulcer (HCC) 11/26/2021  . Perforated viscus 09/01/2021  . Major depressive disorder with single episode, in partial remission (HCC) 03/16/2017  . Iron deficiency anemia due to chronic blood loss 11/18/2016  . Migraines 11/07/2016  . Thyroid nodule 06/06/2016  . S/P cesarean section 08/29/2014  . Venereal disease 06/23/2012  . Cervicalgia 04/21/2012  . 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 Postlaminectomy syndrome, lumbar region 06/26/2011  . Lumbago 05/09/2011  . DJD (degenerative  joint disease), lumbar 05/08/2011  . Seasonal and perennial allergic rhinitis 05/08/2011  . Sacroiliac dysfunction 04/07/2011  . Lumbosacral spondylosis without myelopathy 03/10/2011  . Dizziness and giddiness 11/02/2010  . Preventative health care 04/26/2010  . SMOKER 03/29/2010  . HYPERSOMNIA 02/08/2010  . HYPERLIPIDEMIA 04/26/2009  . Attention deficit disorder 04/26/2009  . Pruritus ani 04/26/2009  . ANXIETY 02/01/2009  . DEPRESSION 02/01/2009  . COMMON MIGRAINE 02/01/2009  . GERD 02/01/2009  . CHOLELITHIASIS 02/01/2009

## 2022-12-21 NOTE — ED Notes (Signed)
Patient in the bedroom calm and sleeping. Will continue to monitor for safety.

## 2022-12-21 NOTE — Progress Notes (Signed)
Pt self isolated in her room this shift but woke up for meals. No distress noted or concerns voiced. Staff will monitor for pt's safety.
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
## 2022-12-21 NOTE — Progress Notes (Signed)
Pt is awake, alert and oriented X3. Pt complained of generalized body ache. No signs of acute distress noted. Pt denies current SI/HI/AVH, plan or intent. Staff will monitor for pt's safety.

## 2022-12-22 DIAGNOSIS — F1914 Other psychoactive substance abuse with psychoactive substance-induced mood disorder: Secondary | ICD-10-CM | POA: Diagnosis not present

## 2022-12-22 DIAGNOSIS — F411 Generalized anxiety disorder: Secondary | ICD-10-CM | POA: Diagnosis not present

## 2022-12-22 DIAGNOSIS — R45851 Suicidal ideations: Secondary | ICD-10-CM | POA: Diagnosis not present

## 2022-12-22 DIAGNOSIS — F32A Depression, unspecified: Secondary | ICD-10-CM | POA: Diagnosis not present

## 2022-12-22 LAB — COMPREHENSIVE METABOLIC PANEL
ALT: 28 U/L (ref 0–44)
AST: 22 U/L (ref 15–41)
Albumin: 3 g/dL — ABNORMAL LOW (ref 3.5–5.0)
Alkaline Phosphatase: 70 U/L (ref 38–126)
Anion gap: 8 (ref 5–15)
BUN: 12 mg/dL (ref 6–20)
CO2: 29 mmol/L (ref 22–32)
Calcium: 8.2 mg/dL — ABNORMAL LOW (ref 8.9–10.3)
Chloride: 102 mmol/L (ref 98–111)
Creatinine, Ser: 0.5 mg/dL (ref 0.44–1.00)
GFR, Estimated: >60 mL/min (ref >60)
Glucose, Bld: 91 mg/dL (ref 70–99)
Potassium: 4.2 mmol/L (ref 3.5–5.1)
Sodium: 139 mmol/L (ref 135–145)
Total Bilirubin: 0.4 mg/dL (ref <1.2)
Total Protein: 6.1 g/dL — ABNORMAL LOW (ref 6.5–8.1)

## 2022-12-22 LAB — CBC WITH DIFFERENTIAL/PLATELET
Abs Immature Granulocytes: 0.01 10*3/uL (ref 0.00–0.07)
Basophils Absolute: 0 10*3/uL (ref 0.0–0.1)
Basophils Relative: 1 %
Eosinophils Absolute: 0 10*3/uL (ref 0.0–0.5)
Eosinophils Relative: 1 %
HCT: 32.6 % — ABNORMAL LOW (ref 36.0–46.0)
Hemoglobin: 9.3 g/dL — ABNORMAL LOW (ref 12.0–15.0)
Immature Granulocytes: 0 %
Lymphocytes Relative: 38 %
Lymphs Abs: 2.1 10*3/uL (ref 0.7–4.0)
MCH: 19.5 pg — ABNORMAL LOW (ref 26.0–34.0)
MCHC: 28.5 g/dL — ABNORMAL LOW (ref 30.0–36.0)
MCV: 68.5 fL — ABNORMAL LOW (ref 80.0–100.0)
Monocytes Absolute: 0.3 10*3/uL (ref 0.1–1.0)
Monocytes Relative: 6 %
Neutro Abs: 3 10*3/uL (ref 1.7–7.7)
Neutrophils Relative %: 54 %
Platelets: 464 10*3/uL — ABNORMAL HIGH (ref 150–400)
RBC: 4.76 MIL/uL (ref 3.87–5.11)
RDW: 19.7 % — ABNORMAL HIGH (ref 11.5–15.5)
Smear Review: NORMAL
WBC: 5.5 10*3/uL (ref 4.0–10.5)
nRBC: 0 % (ref 0.0–0.2)

## 2022-12-22 LAB — VITAMIN D 25 HYDROXY (VIT D DEFICIENCY, FRACTURES): Vit D, 25-Hydroxy: 21.06 ng/mL — ABNORMAL LOW (ref 30–100)

## 2022-12-22 LAB — IRON AND TIBC
Iron: 14 ug/dL — ABNORMAL LOW (ref 28–170)
Saturation Ratios: 3 % — ABNORMAL LOW (ref 10.4–31.8)
TIBC: 480 ug/dL — ABNORMAL HIGH (ref 250–450)
UIBC: 466 ug/dL

## 2022-12-22 LAB — PHOSPHORUS: Phosphorus: 4.5 mg/dL (ref 2.5–4.6)

## 2022-12-22 LAB — VITAMIN B12: Vitamin B-12: 278 pg/mL (ref 180–914)

## 2022-12-22 LAB — TSH: TSH: 1.287 u[IU]/mL (ref 0.350–4.500)

## 2022-12-22 LAB — T4, FREE: Free T4: 0.63 ng/dL (ref 0.61–1.12)

## 2022-12-22 LAB — FOLATE: Folate: 9.8 ng/mL (ref >5.9)

## 2022-12-22 LAB — MAGNESIUM: Magnesium: 2.1 mg/dL (ref 1.7–2.4)

## 2022-12-22 LAB — LACTATE DEHYDROGENASE: LDH: 118 U/L (ref 98–192)

## 2022-12-22 LAB — FERRITIN: Ferritin: 3 ng/mL — ABNORMAL LOW (ref 11–307)

## 2022-12-22 NOTE — ED Notes (Signed)
Pt was provided breakfast.

## 2022-12-22 NOTE — ED Notes (Signed)
Patient resting with eyes closed in no apparent acute distress. Respirations even and unlabored. Environment secured. Safety checks in place according to facility policy.
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
## 2022-12-22 NOTE — ED Notes (Signed)
Pt in the bedroom calm and composed. NAD. Will continue to monitor for safety.

## 2022-12-22 NOTE — Group Note (Signed)
Group Topic: Change and Accountability  Group Date: 12/22/2022 Start Time: 0730 End Time: 0800 Facilitators: Debe Coder, NT  Department: [facility]  Number of Participants: 3  Group Focus: affirmation Treatment Modality:  Individual Therapy Interventions utilized were support Purpose: reinforce self-care  Name: Tina [name] Date of Birth: 1981/01/01  MR: [id number]    Level of Participation: pt did not attend group Quality of Participation: pt did not attend group Interactions with others: pt did not attend group Mood/Affect: pt did not attend group Triggers (if applicable): pt did not attend group Cognition: pt did not attend group Progress: pt did not attend group Response: pt did not attend group Plan: follow-up needed  Patients Problems:  Patient Active Problem List   Diagnosis Date Noted   Alcohol use disorder, severe, in early remission (HCC) 12/21/2022   Cannabis use disorder 12/21/2022   Opioid use disorder, moderate, dependence (HCC) 12/21/2022   Microcytic anemia 12/21/2022   Hypocalcemia 12/21/2022   MDD (major depressive disorder), severe (HCC) 12/21/2022   Social anxiety disorder 12/21/2022   PTSD (post-traumatic stress disorder) 12/21/2022   Substance dependence with physiological dependence (HCC) 12/20/2022   Substance use disorder 12/20/2022   Methamphetamine use disorder, severe (HCC) 12/20/2022   Protein-calorie malnutrition, severe 07/29/2022   H/O Gastrojejunal ulcer with recurrent perforation 07/28/2022   Alcohol dependence (HCC) 07/28/2022   Peritonitis (HCC) 07/27/2022   Sciatica 06/03/2022   Perforated ulcer (HCC) 11/26/2021   Perforated viscus 09/01/2021   MDD (major depressive disorder), recurrent episode (HCC) 03/16/2017   Iron deficiency anemia due to chronic blood loss 11/18/2016   Migraines 11/07/2016   S/P cesarean section 08/29/2014   Venereal disease 06/23/2012   Cervicalgia 04/21/2012    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   Postlaminectomy syndrome, lumbar region 06/26/2011   Lumbago 05/09/2011   DJD (degenerative joint disease), lumbar 05/08/2011   Seasonal and perennial allergic rhinitis 05/08/2011   Sacroiliac dysfunction 04/07/2011   Lumbosacral spondylosis without myelopathy 03/10/2011   Dizziness and giddiness 11/02/2010   Preventative health care 04/26/2010   SMOKER 03/29/2010   HYPERSOMNIA 02/08/2010   HYPERLIPIDEMIA 04/26/2009   Attention deficit disorder 04/26/2009   Pruritus ani 04/26/2009   GAD (generalized anxiety disorder) 02/01/2009   DEPRESSION 02/01/2009   COMMON MIGRAINE 02/01/2009   GERD 02/01/2009   CHOLELITHIASIS 02/01/2009

## 2022-12-22 NOTE — ED Notes (Signed)
 Pt was provided lunch

## 2022-12-22 NOTE — Group Note (Signed)
Group Topic: Recovery Basics  Group Date: 12/22/2022 Start Time: 1000 End Time: 1115 Facilitators: Londell Moh, NT  Department: [facility]  Number of Participants: 3  Group Focus: AA Meeting Treatment Modality:  Psychoeducation Interventions utilized were other AA Meeting Purpose: enhance coping skills, express feelings, increase insight, regain self-worth, reinforce self-care, and relapse prevention strategies  Name: Tina [name] Date of Birth: 04-01-1980  MR: [id number]    Level of Participation: Pt did not attend group Patients Problems:  Patient Active Problem List   Diagnosis Date Noted   Alcohol use disorder, severe, in early remission (HCC) 12/21/2022   Cannabis use disorder 12/21/2022   Opioid use disorder, moderate, dependence (HCC) 12/21/2022   Microcytic anemia 12/21/2022   Hypocalcemia 12/21/2022   MDD (major depressive disorder), severe (HCC) 12/21/2022   Social anxiety disorder 12/21/2022   PTSD (post-traumatic stress disorder) 12/21/2022   Substance dependence with physiological dependence (HCC) 12/20/2022   Substance use disorder 12/20/2022   Methamphetamine use disorder, severe (HCC) 12/20/2022   Protein-calorie malnutrition, severe 07/29/2022   H/O Gastrojejunal ulcer with recurrent perforation 07/28/2022   Alcohol dependence (HCC) 07/28/2022   Peritonitis (HCC) 07/27/2022   Sciatica 06/03/2022   Perforated ulcer (HCC) 11/26/2021   Perforated viscus 09/01/2021   MDD (major depressive disorder), recurrent episode (HCC) 03/16/2017   Iron deficiency anemia due to chronic blood loss 11/18/2016   Migraines 11/07/2016   S/P cesarean section 08/29/2014   Venereal disease 06/23/2012   Cervicalgia 04/21/2012   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   Postlaminectomy syndrome, lumbar region 06/26/2011   Lumbago 05/09/2011   DJD (degenerative joint disease), lumbar 05/08/2011   Seasonal and perennial allergic rhinitis 05/08/2011    Sacroiliac dysfunction 04/07/2011   Lumbosacral spondylosis without myelopathy 03/10/2011   Dizziness and giddiness 11/02/2010   Preventative health care 04/26/2010   SMOKER 03/29/2010   HYPERSOMNIA 02/08/2010   HYPERLIPIDEMIA 04/26/2009   Attention deficit disorder 04/26/2009   Pruritus ani 04/26/2009   GAD (generalized anxiety disorder) 02/01/2009   DEPRESSION 02/01/2009   COMMON MIGRAINE 02/01/2009   GERD 02/01/2009   CHOLELITHIASIS 02/01/2009

## 2022-12-22 NOTE — ED Notes (Signed)
Pt was provided dinner.

## 2022-12-22 NOTE — ED Notes (Signed)
Patient alert & oriented x4. Denies intent to harm self or others when asked. Denies A/VH. Patient reports pain "all over" rating 7/10. PRN Tylenol given with no complications. No further complaints at this time. No acute distress noted. Support and encouragement provided. Routine safety checks conducted per facility protocol. Encouraged patient to notify staff if any thoughts of harm towards self or others arise. Patient verbalizes understanding and agreement.

## 2022-12-22 NOTE — ED Notes (Signed)
Pt is currently sleeping, no distress noted, environmental check complete, will continue to monitor patient for safety.  

## 2022-12-22 NOTE — ED Notes (Addendum)
No light green tube for blood collection. Called [facility] lab and suggested RN to use RED and GOLD.  Labs collected.

## 2022-12-22 NOTE — ED Notes (Addendum)
Patient approached staff asking for belonings from locker. Writer explained to patient that facility policy states that once you're admitted over here on [facility] you are not to go back into locker. Patient became irritatble stating that a different staff member said that she could have her belongings and if someone brought her clothes they would be checked and brought over and that if we aren't going to give her her things then "this is bullshit, I'm not gonna stay here". Writer stated they cannot vouch for what others have said but only they have said and what our policy is. Patient stated we needed to find someone who could and walked off. Writer spoke with security and then with admin Rosalita Chessman and Sheria Lang) who stated that while it is our policy to not go back to the locker, if it will keep the patient from getting further agitated and prevent them from leaving AMA that staff may go into the locker with a second staff member to get a maximum of 3 shirts, 3 pants, and 3 pairs of underwear as well as empahsize that there will be no more going back to the locker. Writer informed patient of update and went with security to retrieve clothing for the patient. Clothing checked and brought onto the unit. Patient still iritatble upon recieval of clothing items stating they "should've been told" and that they "would've grabbed all their stuff before ever coming over here". Patient agitated but in no acute distress. Environment secured, safety checks in place per facility policy.

## 2022-12-22 NOTE — Tx Team (Signed)
LCSW and Resident met with patient to assess current mood, affect, physical state, and inquire about needs/goals while here in [facility] and after discharge. Tina [name] is a 42 y.o. female with PMH of meth use d/o, cannabis use d/o, OUD, AUD (last time 02/2022), PTSD, MDD, GAD, no suicide attempt or inpatient psych admission, protein-calorie malnutrition, IDA 2/2 chronic blood loss, multiple GI ulcers with recurrent rupture, gastric bypass (2021), HLD, housing instability, who presented voluntary to [facility] (12/20/2022) accompanied with son then admitted to [facility] for treatment for meth use. Patient reports she has been sleeping in a tent for the past three months, however after a recent argument with boyfriend she left and went to her father's house. Patient reports when she got to her father's house, she told the family that she was in need of help and reports her son drove her to [facility] in [place]. Patient reports they did not have beds available, so she came to [facility] in [place]. Patient reports due to the pain she was having in her side, [facility] advised her to seek medical attention so she went to the ED and then was sent to the [facility]. Patient reports her current goal is to seek residential placement for substance use, and reports an interest in going to [facility] once stable. Patient currently denies any SI/HI/AVH and reports mood as "alright". Patient aware that LCSW will send referrals out for review and will follow up to provide updates as received. Patient expressed understanding and appreciation of LCSW assistance. No other needs were reported at this time by patient.   Patient has Medicaid [employer]. Referral has been sent to [facility] for review. Per Marcelino Duster, once reviewed she will follow up with LCSW to provide update. LCSW will continue to follow and provide support to patient while on [facility] unit.   Fernande Boyden, LCSW Clinical Social Worker [facility] Ph:  [phone]

## 2022-12-22 NOTE — ED Notes (Signed)
Patient medications have been given, patient is in her room sleeping, patient stated she was in no pain, and feels better as long as she is sleeping.

## 2022-12-23 DIAGNOSIS — F411 Generalized anxiety disorder: Secondary | ICD-10-CM | POA: Diagnosis not present

## 2022-12-23 DIAGNOSIS — R45851 Suicidal ideations: Secondary | ICD-10-CM | POA: Diagnosis not present

## 2022-12-23 DIAGNOSIS — F32A Depression, unspecified: Secondary | ICD-10-CM | POA: Diagnosis not present

## 2022-12-23 DIAGNOSIS — F1914 Other psychoactive substance abuse with psychoactive substance-induced mood disorder: Secondary | ICD-10-CM | POA: Diagnosis not present

## 2022-12-23 LAB — HAPTOGLOBIN: Haptoglobin: 169 mg/dL (ref 42–296)

## 2022-12-23 MED ORDER — MIRTAZAPINE 15 MG PO TABS: 15.0000 mg | ORAL_TABLET | Freq: Every day | ORAL | 0 refills | Status: DC

## 2022-12-23 MED ORDER — HYDROXYZINE HCL 25 MG PO TABS: 25.0000 mg | ORAL_TABLET | Freq: Three times a day (TID) | ORAL | 0 refills | Status: DC | PRN
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
## 2022-12-23 NOTE — Discharge Planning (Signed)
Referral was received and per Marcelino Duster, patient has been accepted and can transfer to the facility on today 12/23/2022 by 9:00am. Update has been provided to the patient and MD made aware. Patient will need a 14-30 day supply of medication and one month refill. No nicotine gum allowed, however 14-30 day nicotine patches to be provided if needed. No other needs to report at this time.    LCSW will continue to follow up and provide updates as received.    Fernande Boyden, LCSW Clinical Social Worker [facility] Ph: [phone]

## 2022-12-23 NOTE — ED Notes (Signed)
Patient has complaint of back pains.

## 2022-12-23 NOTE — ED Notes (Signed)
Pt is currently sleeping, no distress noted, environmental check complete, will continue to monitor patient for safety.  

## 2022-12-23 NOTE — Discharge Instructions (Addendum)
Patient will be discharging to [facility] on today 12/23/2022 by 9:00am with transportation provided via Tax[employer]. Address is [address] [place], [place] [address].   Follow-up recommendations:  Activity:  Normal, as tolerated Diet:  Per PCP recommendation  Patient is instructed prior to discharge to: Take all medications as prescribed by her mental healthcare provider. Report any adverse effects and/or reactions from the medicines to her outpatient provider promptly. Patient has been instructed & cautioned: To not engage in alcohol and or illegal drug use while on prescription medicines.  In the event of worsening symptoms, patient is instructed to call the crisis hotline at 988, 911 and or go to the nearest ED for appropriate evaluation and treatment of symptoms. To follow-up with her primary care provider for your other medical issues, concerns and or health care needs.   [facility] [address], [place], [address] [phone] phone  New Patient Assessment/Therapy Walk-Ins:  Monday and Wednesday: 8 am until slots are full. Every 1st and 2nd Fridays of the month: 1 pm - 5 pm.  NO ASSESSMENT/THERAPY WALK-INS ON TUESDAYS OR THURSDAYS  New Patient Assessment/Medication Management Walk-Ins:  Monday - Friday:  8 am - 11 am.  For all walk-ins, we ask that you arrive by 7:30 am because patients will be seen in the order of arrival.  Availability is limited; therefore, you may not be seen on the same day that you walk-in.  Our goal is to serve and meet the needs of our community to the best of our ability.  SUBSTANCE USE TREATMENT for Medicaid and State Funded/IPRS  [facility] [address], [place], [address] [phone] phone NOTE: [facility] is no longer offering IOP services.  Serves those who are low-income or have no insurance.  [facility] [address] [place], [place], [address] [phone]  phone [phone] fax NOTE: Does have Substance Abuse-Intensive Outpatient Program [facility]) as well as transitional housing if eligible.  [facility] [address] [place], [place], [address] [phone] phone [phone] fax  [facility] [phone] [address] [place], [place], [address] [phone] phone [phone] fax  HALFWAY HOUSES:  [facility] [phone]  [employer].oxfordvacancies.com  12 STEP PROGRAMS:  Alcoholics Anonymous of [place] [employer]  Narcotics Anonymous of [place] [employer]  Al-Anon of [place], [place] www.[place]alanon.org/find-meetings.html  Nar-Anon https://nar-anon.org/find-a-meetin  List of Residential placements:   [facility] in [place]: [phone]  [facility]: [phone]  Ranelle [place], [place] [phone]: Female and female facility; 30-day program: (uninsured and Medicaid such as Laurena Bering, [place], [place], partners)  [facility]: [phone]; men and women's facility; 28 days; Can have Medicaid tailored plan Tour manager or Partners)  [facility]: [phone] Karoline Caldwell or Larita Fife; 28 day program; must be fully detox; tailored Medicaid or no insurance  [address] in [place], [place]; [phone]; 28 day all males program; no insurance accepted  [facility] in [place]: Gabriel Rung [phone] (no insurance or Medicaid only); 90 days; outpatient services but provide housing in apartments downtown [place]  [facility] Admission: [phone]: Patient must complete phone screening for placement: [place]; 6 month program; uninsured, Medicaid, and [employer].   [facility]: no insurance required; [phone]  [facility]: [phone]; Intake: Molly Maduro; Must fill out application online; Alecia Lemming Delay [phone] x [address]  [facility] in [place], [place]: [phone]; Admissions Coordinators Mr. Maurine Minister or Barron Alvine; 90 day program.  [facility]: [place], [place] [phone]; Co-Ed 9 month to a year program; Online application; Men entry fee is $500 (6-13months);  [employer]: [address] [place], [place] [address]; no fee or insurance required; minimum of 2 years; Highly structured; work based; Intake Coordinator is Thayer Ohm [phone]  [facility] in [place], [place]: [phone]; Fax number is [phone]; website: www.[facility].org; Requires 3-6 page autobiography; 2 year program (18 months and then 17month transitional housing); Admission fee is $300; no insurance needed; work Automotive engineer in [place], [place]: [employer] Desk Staff: Danise Edge [phone]: They have a Men's Regenerations Program 6-52months. Free program; There is an initial $300 fee however, they are willing to work with patients regarding that. Application is online.  [facility]: Admissions [phone] Doran Heater ext [phone]; Any 7-90 day program is out of pocket; 12 month program is free of charge; there is a $275 entry fee; Patient is responsible for own transportation

## 2022-12-27 LAB — VITAMIN B1: Vitamin B1 (Thiamine): 147.9 nmol/L (ref 66.5–200.0)

## 2023-03-29 ENCOUNTER — Other Ambulatory Visit: Payer: Self-pay

## 2023-03-29 ENCOUNTER — Emergency Department (HOSPITAL_COMMUNITY)
Admission: EM | Admit: 2023-03-29 | Discharge: 2023-03-29 | Disposition: A | Discharge status: Home / Self Care | Payer: MEDICAID | Attending: Emergency Medicine | Admitting: Emergency Medicine

## 2023-03-29 ENCOUNTER — Emergency Department (HOSPITAL_COMMUNITY): Payer: MEDICAID

## 2023-03-29 ENCOUNTER — Encounter (HOSPITAL_COMMUNITY): Payer: Self-pay | Admitting: Emergency Medicine

## 2023-03-29 ENCOUNTER — Emergency Department (HOSPITAL_BASED_OUTPATIENT_CLINIC_OR_DEPARTMENT_OTHER): Payer: MEDICAID

## 2023-03-29 DIAGNOSIS — W1839XA Other fall on same level, initial encounter: Secondary | ICD-10-CM | POA: Diagnosis not present

## 2023-03-29 DIAGNOSIS — M7989 Other specified soft tissue disorders: Secondary | ICD-10-CM | POA: Diagnosis present

## 2023-03-29 MED ORDER — ACETAMINOPHEN 500 MG PO TABS
1000.0000 mg | ORAL_TABLET | Freq: Once | ORAL | Status: AC
  Administered 2023-03-29: 1000 mg via ORAL
  Filled 2023-03-29: qty 2

## 2023-03-29 NOTE — ED Provider Notes (Signed)
 [place] EMERGENCY DEPARTMENT AT [facility] Provider Note   CSN: [id number] Arrival date & time: 03/29/23  0422     History  Chief Complaint  Patient presents with   Leg Pain    Tina [name] is a 43 y.o. female who presents with concern for left leg pain and swelling progressively worsening for the last 10 days after mechanical fall on 3/8.  That day she said she lost her balance when they were walking and hurt her left ankle but the left lower extremity swelling has significantly worsened and now she is having pain in her hip as well.  Ambulatory, minimal improvement at home with Tylenol.  Reports frequent falls.  Hx of hyperlipidemia, MDD, polysubstance use.   HPI     Home Medications Prior to Admission medications   Medication Sig Start Date End Date Taking? Authorizing Provider  hydrOXYzine (ATARAX) 25 MG tablet Take 1 tablet (25 mg total) by mouth 3 (three) times daily as needed for anxiety. 12/23/22   Lance Muss, MD  mirtazapine (REMERON) 15 MG tablet Take 1 tablet (15 mg total) by mouth at bedtime. 12/23/22   Lance Muss, MD      Allergies    Nsaids, Tramadol, Oxycodone, and Wellbutrin [bupropion]    Review of Systems   Review of Systems  Musculoskeletal:        LLE pain and swelling    Physical Exam Updated Vital Signs BP 108/69   Pulse 88   Temp 98.2 F (36.8 C) (Oral)   Resp 16   Ht 5\' 7"  (1.702 m)   Wt 57.6 kg   SpO2 100%   BMI 19.89 kg/m  Physical Exam Vitals and nursing note reviewed.  Constitutional:      Appearance: She is not ill-appearing or toxic-appearing.  HENT:     Head: Normocephalic and atraumatic.     Mouth/Throat:     Mouth: Mucous membranes are moist.     Pharynx: No oropharyngeal exudate or posterior oropharyngeal erythema.  Eyes:     General:        Right eye: No discharge.        Left eye: No discharge.     Conjunctiva/sclera: Conjunctivae normal.  Cardiovascular:     Rate and Rhythm: Normal rate  and regular rhythm.     Pulses:          Femoral pulses are 2+ on the left side.      Dorsalis pedis pulses are 1+ on the right side and 2+ on the left side.     Heart sounds: Normal heart sounds. No murmur heard.    Comments: Per patient baseline lower extremity edema, nonpitting, new asymmetry left greater than right. Pulmonary:     Effort: Pulmonary effort is normal. No respiratory distress.     Breath sounds: Normal breath sounds. No wheezing or rales.  Abdominal:     General: There is no distension.     Palpations: Abdomen is soft.     Tenderness: There is no abdominal tenderness. There is no guarding or rebound.  Musculoskeletal:        General: No deformity.     Cervical back: Neck supple.     Right lower leg: 1+ Edema present.     Left lower leg: 2+ Edema present.  Skin:    General: Skin is warm and dry.     Capillary Refill: Capillary refill takes less than 2 seconds.  Neurological:     General:  No focal deficit present.     Mental Status: She is alert and oriented to person, place, and time. Mental status is at baseline.  Psychiatric:        Mood and Affect: Mood normal.     ED Results / Procedures / Treatments   Labs (all labs ordered are listed, but only abnormal results are displayed) Labs Reviewed - No data to display  EKG None  Radiology DG Hip Unilat W or Wo Pelvis 2-3 Views Left Result Date: 03/29/2023 CLINICAL DATA:  Fall with left lower extremity swelling EXAM: DG HIP (WITH OR WITHOUT PELVIS) 3V LEFT COMPARISON:  None Available. FINDINGS: There is no evidence of hip fracture or dislocation. Mild spurring at the left greater trochanter. Lumbosacral disc space narrowing with mild spurring. IMPRESSION: No acute finding. Electronically Signed   By: Tiburcio Pea M.D.   On: 03/29/2023 05:51   DG Ankle Complete Left Result Date: 03/29/2023 CLINICAL DATA:  Left lower extremity swelling.  Status post fall. EXAM: LEFT ANKLE COMPLETE - 3+ VIEW COMPARISON:  None  Available. FINDINGS: There is no evidence of fracture, dislocation, or joint effusion. There is no evidence of arthropathy or other focal bone abnormality. Diffuse soft tissue edema. IMPRESSION: 1. No acute bone abnormality. 2. Diffuse soft tissue edema. Electronically Signed   By: Signa Kell M.D.   On: 03/29/2023 05:50   DG Knee Complete 4 Views Left Result Date: 03/29/2023 CLINICAL DATA:  Fall.  Left lower extremity swelling. EXAM: LEFT KNEE - COMPLETE 4+ VIEW COMPARISON:  None Available. FINDINGS: No significant joint effusion. No acute fracture or dislocation. Sharpening of the tibial spines. Patellofemoral and medial compartment marginal spur formation. IMPRESSION: 1. No acute findings. 2. Mild osteoarthritis. Electronically Signed   By: Signa Kell M.D.   On: 03/29/2023 05:49    Procedures Procedures    Medications Ordered in ED Medications - No data to display  ED Course/ Medical Decision Making/ A&P                                 Medical Decision Making 43 year old female with left lower extremity swelling and pain after fall 10 days ago.  Vitals on intake, cardiopulmonary dam unremarkable, no murmur.  Abdominal exam is benign.  Lower extremity pulses are intact, femoral pulses intact on the left.  Nonpitting edema up to the knee left greater than right.  Significant tenderness palpation over the distal tibia with healing bruising.  No deformity or step-off.  Amount and/or Complexity of Data Reviewed Radiology: ordered.    Details: Plain films negative for acute abnormality.    Will proceed with BLE vascular ultrasound to r/o DVTs. Care of this patient signed out to oncoming ED provider Thayer Jew, PA-C at time of shift change. All pertinent HPI, physical exam, and laboratory findings were discussed with them prior to my departure. Disposition of patient pending completion of workup, reevaluation, and clinical judgement of oncoming ED provider.   This chart was dictated  using voice recognition software, Dragon. Despite the best efforts of this provider to proofread and correct errors, errors may still occur which can change documentation meaning.          Final Clinical Impression(s) / ED Diagnoses Final diagnoses:  None    Rx / DC Orders ED Discharge Orders     None         Paris Lore, PA-C 03/29/23 7829  Gloris Manchester, MD 03/29/23 [phone]

## 2023-03-29 NOTE — ED Notes (Signed)
 Pt refused discharge vitals x 3

## 2023-03-29 NOTE — ED Notes (Signed)
 Pt asking to be discharged, provider made aware

## 2023-03-29 NOTE — ED Triage Notes (Signed)
 Patient BIB EMS for evaluation of L leg pain.  Reports falling on 3/8 and injuring L ankle.  States symptoms started to improve.  Yesterday she "did a lot of walking" and swelling developed in bilateral lower legs.  Pain and swelling worse in L leg.

## 2023-03-29 NOTE — Discharge Instructions (Addendum)
 Keep legs elevated when you are resting.  Wear compression stockings during the day.  Follow up with PCP.   Take tylenol 1000mg  every 6 hours for pain. You can take ibuprofen as well.   Your x-ray and ultrasound are reassuring here today.

## 2023-03-29 NOTE — ED Notes (Signed)
 Patient transported to vascular.

## 2023-03-29 NOTE — ED Provider Notes (Signed)
  Physical Exam  BP 108/69   Pulse 88   Temp 98.2 F (36.8 C) (Oral)   Resp 16   Ht 5\' 7"  (1.702 m)   Wt 57.6 kg   SpO2 100%   BMI 19.89 kg/m   Physical Exam Constitutional:      Appearance: She is normal weight.  HENT:     Head: Normocephalic and atraumatic.  Cardiovascular:     Rate and Rhythm: Normal rate and regular rhythm.  Pulmonary:     Effort: Pulmonary effort is normal.     Breath sounds: Normal breath sounds.  Abdominal:     General: Abdomen is flat. Bowel sounds are normal.     Palpations: Abdomen is soft.  Musculoskeletal:     Right lower leg: Edema present.     Left lower leg: Edema present.     Comments: No erythema over lower extremity.    Neurological:     Mental Status: She is alert.     Comments: Ambulates with steady gait.      Procedures  Procedures  ED Course / MDM   Clinical Course as of 03/29/23 0935  Sun Mar 29, 2023  0907 VAS [place] LOWER EXTREMITY VENOUS (DVT) (ONLY [facility] & [facility]) [JB]    Clinical Course User Index [JB] Demetrice Amstutz, Horald Chestnut, PA-C   Medical Decision Making Amount and/or Complexity of Data Reviewed Radiology: ordered. Decision-making details documented in ED Course.  Risk OTC drugs.    Patient was received in signout from [employer]. In short, patient had a fall 10 days ago, complaining of left lower extremity swelling and pain. She has TTP over anterior left shin. Her x-ray of pelvis, knee and ankle without fracture. She is able to ambulate. Patient reports the lower ext edema is her baseline. Dispo pending DVT study. Stable and well appearing. Not on blood thinner.   Vas [place] -- negative for DVT, shows lymphadenopathy of bilateral groin which is likely reactive to bilaterally swelling. She has no sign of cellulitis or infection. Discussed with patient all imaging results. Discussed treatment and follow up plan. She will elevated legs and try compression stockings. She will follow up with PCP. Pain is well controled at this time.  Agrees and understands plan. She is stable for discharge.       Reinaldo Raddle 03/29/23 7829    Rondel Baton, MD 03/29/23 [phone]

## 2023-03-29 NOTE — Progress Notes (Signed)
 VASCULAR LAB    Bilateral lower extremity venous duplex has been performed.  See CV proc for preliminary results.  Relayed results to Dr. Durwin Nora and Jasmine Pang, PA-C  [facility], RVT 03/29/2023, 8:26 AM

## 2023-03-29 NOTE — ED Notes (Signed)
Pt ambulated to the restroom with assistance from family.

## 2023-05-20 ENCOUNTER — Other Ambulatory Visit: Payer: Self-pay

## 2023-05-20 ENCOUNTER — Ambulatory Visit (HOSPITAL_COMMUNITY)
Admission: EM | Admit: 2023-05-20 | Discharge: 2023-05-20 | Disposition: A | Discharge status: Other Acute Inpatient Hospital | Payer: MEDICAID | Attending: Psychiatry | Admitting: Psychiatry

## 2023-05-20 DIAGNOSIS — F4310 Post-traumatic stress disorder, unspecified: Secondary | ICD-10-CM | POA: Insufficient documentation

## 2023-05-20 DIAGNOSIS — F1290 Cannabis use, unspecified, uncomplicated: Secondary | ICD-10-CM | POA: Insufficient documentation

## 2023-05-20 DIAGNOSIS — F1410 Cocaine abuse, uncomplicated: Secondary | ICD-10-CM | POA: Insufficient documentation

## 2023-05-20 DIAGNOSIS — F1910 Other psychoactive substance abuse, uncomplicated: Secondary | ICD-10-CM

## 2023-05-20 DIAGNOSIS — E119 Type 2 diabetes mellitus without complications: Secondary | ICD-10-CM | POA: Insufficient documentation

## 2023-05-20 DIAGNOSIS — F319 Bipolar disorder, unspecified: Secondary | ICD-10-CM | POA: Insufficient documentation

## 2023-05-20 DIAGNOSIS — F411 Generalized anxiety disorder: Secondary | ICD-10-CM | POA: Insufficient documentation

## 2023-05-20 DIAGNOSIS — Z5900 Homelessness unspecified: Secondary | ICD-10-CM | POA: Diagnosis not present

## 2023-05-20 LAB — CBC WITH DIFFERENTIAL/PLATELET
Abs Immature Granulocytes: 0.01 10*3/uL (ref 0.00–0.07)
Basophils Absolute: 0 10*3/uL (ref 0.0–0.1)
Basophils Relative: 1 %
Eosinophils Absolute: 0 10*3/uL (ref 0.0–0.5)
Eosinophils Relative: 1 %
HCT: 25 % — ABNORMAL LOW (ref 36.0–46.0)
Hemoglobin: 6.7 g/dL — CL (ref 12.0–15.0)
Immature Granulocytes: 0 %
Lymphocytes Relative: 31 %
Lymphs Abs: 1.6 10*3/uL (ref 0.7–4.0)
MCH: 16.2 pg — ABNORMAL LOW (ref 26.0–34.0)
MCHC: 26.8 g/dL — ABNORMAL LOW (ref 30.0–36.0)
MCV: 60.4 fL — ABNORMAL LOW (ref 80.0–100.0)
Monocytes Absolute: 0.4 10*3/uL (ref 0.1–1.0)
Monocytes Relative: 8 %
Neutro Abs: 3.1 10*3/uL (ref 1.7–7.7)
Neutrophils Relative %: 59 %
Platelets: 359 10*3/uL (ref 150–400)
RBC: 4.14 MIL/uL (ref 3.87–5.11)
RDW: 19.4 % — ABNORMAL HIGH (ref 11.5–15.5)
WBC: 5.1 10*3/uL (ref 4.0–10.5)
nRBC: 0 % (ref 0.0–0.2)

## 2023-05-20 LAB — COMPREHENSIVE METABOLIC PANEL WITH GFR
ALT: 21 U/L (ref 0–44)
AST: 25 U/L (ref 15–41)
Albumin: 3.7 g/dL (ref 3.5–5.0)
Alkaline Phosphatase: 56 U/L (ref 38–126)
Anion gap: 8 (ref 5–15)
BUN: 7 mg/dL (ref 6–20)
CO2: 24 mmol/L (ref 22–32)
Calcium: 8.6 mg/dL — ABNORMAL LOW (ref 8.9–10.3)
Chloride: 107 mmol/L (ref 98–111)
Creatinine, Ser: 0.52 mg/dL (ref 0.44–1.00)
GFR, Estimated: >60 mL/min (ref >60)
Glucose, Bld: 82 mg/dL (ref 70–99)
Potassium: 3.3 mmol/L — ABNORMAL LOW (ref 3.5–5.1)
Sodium: 139 mmol/L (ref 135–145)
Total Bilirubin: 0.5 mg/dL (ref 0.0–1.2)
Total Protein: 6.4 g/dL — ABNORMAL LOW (ref 6.5–8.1)

## 2023-05-20 LAB — TSH: TSH: 0.378 u[IU]/mL (ref 0.350–4.500)

## 2023-05-20 LAB — ETHANOL: Alcohol, Ethyl (B): <15 mg/dL (ref <15)

## 2023-05-20 MED ORDER — OLANZAPINE 10 MG IM SOLR: 10.0000 mg | Freq: Three times a day (TID) | INTRAMUSCULAR | Status: DC | PRN

## 2023-05-20 MED ORDER — ALUM & MAG HYDROXIDE-SIMETH 200-200-20 MG/5ML PO SUSP: 30.0000 mL | ORAL | Status: DC | PRN

## 2023-05-20 MED ORDER — MAGNESIUM HYDROXIDE 400 MG/5ML PO SUSP: 30.0000 mL | Freq: Every day | ORAL | Status: DC | PRN

## 2023-05-20 MED ORDER — OLANZAPINE 5 MG PO TBDP: 5.0000 mg | ORAL_TABLET | Freq: Three times a day (TID) | ORAL | Status: DC | PRN

## 2023-05-20 MED ORDER — OLANZAPINE 10 MG IM SOLR: 5.0000 mg | Freq: Three times a day (TID) | INTRAMUSCULAR | Status: DC | PRN

## 2023-05-20 MED ORDER — ACETAMINOPHEN 325 MG PO TABS: 650.0000 mg | ORAL_TABLET | Freq: Four times a day (QID) | ORAL | Status: DC | PRN

## 2023-05-20 NOTE — ED Notes (Signed)
 Report given to Charge Nurse Adriana Hopping, Dr Ranelle Buys accepting MD.  Pending Safe Transport to [facility].
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
## 2023-05-20 NOTE — ED Notes (Signed)
 Safe Transport requested.

## 2023-05-20 NOTE — BH Assessment (Signed)
 Comprehensive Clinical Assessment (CCA) Note  05/20/2023 Tina [name] [id number]  DISPOSITION: Pending provider assessment  The patient demonstrates the following risk factors for suicide: Chronic risk factors for suicide include: psychiatric disorder of MDD and substance use disorder. Acute risk factors for suicide include: family or marital conflict, unemployment, social withdrawal/isolation, and loss (financial, interpersonal, professional). Protective factors for this patient include: hope for the future. Considering these factors, the overall suicide risk at this point appears to be low. Patient is appropriate for outpatient follow up.   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 Her significant other is seeking treatment as well. Pt stated that her last use of both was earlier today. Hx of MDD, GAD, PTSD and per pt, Bipolar d/o. Pt denied any periods of manic symptoms when she is not using stimulants. Hx of polysubstance use. Pt stated that she once drank alcohol regularly but pt denied any use recently.  Pt denied current SI, HI, self-harm, AVH and paranoia. Pt stated that she has thoughts at times that she would be better off dead. Pt denied any plan of action or any past suicide attempts. Pt stated that she has been prescribed psychiatric medications in the past but stopped taking them about a year ago. Pt stated she was seeing an OP therapist until about 4 months ago. No current OP providers. Pt denied that she has ever been admitted to a psychiatric hospital.   Pt stated that she is currently homeless and moving from place to place. Pt stated that last night she stayed at her sister-in-law's home but today had to leave. Pt stated she is unemployed and does not receive disability income. Pt stated that she was divorced about 8 years ago. Pt stated she has 2 children ages 65 and 40 yo. Per  pt, both are living with relatives currently. Pt denied access to firearms and denied any current legal issues such as pending charges. Pt denied any abuse of any kind.   Pt was calm, cooperative, alert and seemed fully oriented. She did not appear to be responding to internal stimuli. She was dressed casually and seemed disheveled. Speech, eye contact and movement was within normal limits. Pt's mood seemed somewhat depressed and at times euthymic. Her affect was often flat but she did smile and laugh at times during the assessment. Pt stated that she has trouble sleeping at night as she is unhoused currently.  Pt stated that her appetite has been decreased over the last few weeks.    Chief Complaint: No chief complaint on file.  Visit Diagnosis:  Stimulant Use d/o, Methamphetamine Cannabis Use d/o MDD, Recurrent, Moderate    CCA Screening, Triage and Referral (STR)  Patient Reported Information How did you hear about us ? Family/Friend  What Is the Reason for Your Visit/Call Today? 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 Her significant other is seeking treatment as well. Pt stated that her last use of both was earlier today. Hx of MDD, GAD, PTSD and per pt, Bipolar d/o. Pt denied any periods of manic symptoms when she is not using stimulants. Hx of polysubstance use. Pt denied SI, HI, self-harm, AVH and paranoia. Pt stated that she has been prescribed psychiatric medications in the past but stopped taking them about a year ago. Pt stated she was seeing an OP therapist until about 4 months  ago. No current OP providers.  How Long Has This Been Causing You Problems? > than 6 months  What Do You Feel Would Help You the Most Today? Alcohol or Drug Use Treatment   Have You Recently Had Any Thoughts About Hurting Yourself? Yes  Are You Planning to Commit Suicide/Harm Yourself At This time?  No   Flowsheet Row ED from 05/20/2023 in [facility] ED from 03/29/2023 in [facility] Emergency Department at [facility] ED from 12/20/2022 in [facility]  C-SSRS RISK CATEGORY Low Risk No Risk Low Risk       Have you Recently Had Thoughts About Hurting Someone Tina [name]? No  Are You Planning to Harm Someone at This Time? No  Explanation: N/A   Have You Used Any Alcohol or Drugs in the Past 24 Hours? Yes  How Long Ago Did You Use Drugs or Alcohol? Earlier today What Did You Use and How Much? Methamphetamine earlier today, unknown quantity   Do You Currently Have a Therapist/Psychiatrist? No  Name of Therapist/Psychiatrist:    Have You Been Recently Discharged From Any Office Practice or Programs? No  Explanation of Discharge From Practice/Program: N/A     CCA Screening Triage Referral Assessment Type of Contact: Face-to-Face  Telemedicine Service Delivery:   Is this Initial or Reassessment?   Date Telepsych consult ordered in [facility]:    Time Telepsych consult ordered in [facility]:    Location of Assessment: [facility] Assessment Services  Provider Location: [facility] Assessment Services   Collateral Involvement: none   Does Patient Have a Automotive engineer Guardian? No  Legal Guardian Contact Information: adult  Copy of Legal Guardianship Form: -- (na)  Legal Guardian Notified of Arrival: -- (na)  Legal Guardian Notified of Pending Discharge: -- (na)  If Minor and Not Living with Parent(s), Who has Custody? adult  Is CPS involved or ever been involved? -- (none reported)  Is APS involved or ever been involved? -- (none reported)   Patient Determined To Be At Risk for Harm To Self or Others Based on Review of Patient Reported Information or Presenting Complaint? No  Method: No Plan  Availability of Means: No access or NA  Intent: Vague intent or NA  Notification Required: No need or identified  person  Additional Information for Danger to Others Potential: -- (na)  Additional Comments for Danger to Others Potential: no danger to others  Are There Guns or Other Weapons in Your Home? No (denied)  Types of Guns/Weapons: na  Are These Weapons Safely Secured?                            -- (na)  Who Could Verify You Are Able To Have These Secured: significant other  Do You Have any Outstanding Charges, Pending Court Dates, Parole/Probation? none-denied  Contacted To Inform of Risk of Harm To Self or Others: -- (na)    Does Patient Present under Involuntary Commitment? No    [place] of Residence: [place] (unhoused)   Patient Currently Receiving the Following Services: Not Receiving Services   Determination of Need: Urgent (48 hours)   Options For Referral: Facility-Based Crisis     CCA Biopsychosocial Patient Reported Schizophrenia/Schizoaffective Diagnosis in Past: No   Strengths: Pt is willing to seek treamtment for substance use   Mental Health Symptoms Depression:  Increase/decrease in appetite; Sleep (too much or little); Change in energy/activity; Difficulty Concentrating;  Fatigue; Hopelessness   Duration of Depressive symptoms: Duration of Depressive Symptoms: Greater than two weeks   Mania:  None (Pt denied manic symptoms when not using stimulants.)   Anxiety:   None   Psychosis:  None   Duration of Psychotic symptoms:    Trauma:  None   Obsessions:  None   Compulsions:  None   Inattention:  None   Hyperactivity/Impulsivity:  None   Oppositional/Defiant Behaviors:  N/A   Emotional Irregularity:  None   Other Mood/Personality Symptoms:  none observed    Mental Status Exam Appearance and self-care  Stature:  Average   Weight:  Average weight   Clothing:  Disheveled; Casual   Grooming:  Neglected   Cosmetic use:  None   Posture/gait:  Normal   Motor activity:  Not Remarkable   Sensorium  Attention:  Normal    Concentration:  Normal   Orientation:  Time; Situation; Place; Person; Object; X5   Recall/memory:  Normal   Affect and Mood  Affect:  Flat; Full Range; Depressed (labile during assessment)   Mood:  Depressed; Anxious; Euthymic   Relating  Eye contact:  Normal   Facial expression:  Sad; Responsive   Attitude toward examiner:  Cooperative; Dramatic   Thought and Language  Speech flow: Clear and Coherent   Thought content:  Appropriate to Mood and Circumstances   Preoccupation:  None   Hallucinations:  None   Organization:  Coherent   [employer] of Knowledge:  Fair   Intelligence:  Average   Abstraction:  Functional   Judgement:  Fair   Dance movement psychotherapist:  Realistic   Insight:  Fair   Decision Making:  Vacilates   Social Functioning  Social Maturity:  Irresponsible   Social Judgement:  Normal   Stress  Stressors:  Surveyor, quantity; Relationship; Housing; Family conflict   Coping Ability:  Exhausted; Overwhelmed   Skill Deficits:  Self-control; Self-care; Responsibility; Decision making   Supports:  Family; Friends/Service system; Support needed     Religion: Religion/Spirituality Are You A Religious Person?: No How Might This Affect Treatment?: N/A  Leisure/Recreation: Leisure / Recreation Do You Have Hobbies?: No (denied)  Exercise/Diet: Exercise/Diet Do You Exercise?: Yes What Type of Exercise Do You Do?: Run/Walk How Many Times a Week Do You Exercise?: 6-7 times a week Have You Gained or Lost A Significant Amount of Weight in the Past Six Months?: No Do You Follow a Special Diet?: No Do You Have Any Trouble Sleeping?: Yes Explanation of Sleeping Difficulties: Pt stated that she has trouble sleeping at night as she is unhoused currently.   CCA Employment/Education Employment/Work Situation: Employment / Work Situation Employment Situation: Unemployed Patient's Job has Been Impacted by Current Illness:  (na) Has Patient ever  Been in Equities trader?: No  Education: Education Is Patient Currently Attending School?: No Last Grade Completed: 8 (Completed GED) Did You Attend College?: No Did You Have An Individualized Education Program (IIEP): No Did You Have Any Difficulty At School?: No Patient's Education Has Been Impacted by Current Illness: No   CCA Family/Childhood History Family and Relationship History: Family history Marital status: Divorced Divorced, when?: 8 years ago What types of issues is patient dealing with in the relationship?: no details given Additional relationship information: none given Does patient have children?: Yes How many children?: 2 (Ages 39 & 30 yo- living with relatives currently) How is patient's relationship with their children?: good when she is not feeeling ovewhelmed  Childhood History:  Childhood  History By whom was/is the patient raised?: Both parents Did patient suffer any verbal/emotional/physical/sexual abuse as a child?: No (Pt denied all abuse.) Has patient ever been sexually abused/assaulted/raped as an adolescent or adult?: No Witnessed domestic violence?: No Has patient been affected by domestic violence as an adult?: No       CCA Substance Use Alcohol/Drug Use: Alcohol / Drug Use Pain Medications: See MAR Prescriptions: See MAR Over the Counter: See MAR History of alcohol / drug use?: Yes Longest period of sobriety (when/how long): 2 weeks Negative Consequences of Use:  (none reported) Withdrawal Symptoms: None (none reported) Substance #1 Name of Substance 1: Crystal methamphetamine ("ICE") 1 - Age of First Use: 40 1 - Amount (size/oz): 4-5 bumps or lines 1 - Frequency: daily 1 - Duration: ongoing for last 3-4 days (relapsed 4 days ago) 1 - Last Use / Amount: earlier today 1 - Method of Aquiring: unknown 1- Route of Use: snort Substance #2 Name of Substance 2: cannabis 2 - Age of First Use: 27 2 - Amount (size/oz): 2 blunts 2 - Frequency:  daily 2 - Duration: ongoing 2 - Last Use / Amount: earlier today 2 - Method of Aquiring: unknown 2 - Route of Substance Use: smoked and currently vaping                     ASAM's:  Six Dimensions of Multidimensional Assessment  Dimension 1:  Acute Intoxication and/or Withdrawal Potential:   Dimension 1:  Description of individual's past and current experiences of substance use and withdrawal: no withdrawal symptoms reported  Dimension 2:  Biomedical Conditions and Complications:   Dimension 2:  Description of patient's biomedical conditions and  complications: none reported  Dimension 3:  Emotional, Behavioral, or Cognitive Conditions and Complications:  Dimension 3:  Description of emotional, behavioral, or cognitive conditions and complications: Hx of MDD, GAD, PTSD and per pt, Bipolar d/o; Mild to moderate MDD sx currently  Dimension 4:  Readiness to Change:  Dimension 4:  Description of Readiness to Change criteria: Pt is willing to engage in treatment  Dimension 5:  Relapse, Continued use, or Continued Problem Potential:  Dimension 5:  Relapse, continued use, or continued problem potential critiera description: Pt stated she relpased 3-4 days ago and has been on a binge since that time  Dimension 6:  Recovery/Living Environment:  Dimension 6:  Recovery/Iiving environment criteria description: Pt is currently homeless and unemployed.  ASAM Severity Score: ASAM's Severity Rating Score: 9  ASAM Recommended Level of Treatment: ASAM Recommended Level of Treatment: Level II Intensive Outpatient Treatment   Substance use Disorder (SUD) Substance Use Disorder (SUD)  Checklist Symptoms of Substance Use: Continued use despite persistent or recurrent social, interpersonal problems, caused or exacerbated by use, Continued use despite having a persistent/recurrent physical/psychological problem caused/exacerbated by use, Recurrent use that results in a failure to fulfill major role  obligations (work, school, home), Presence of craving or strong urge to use  Recommendations for Services/Supports/Treatments: Recommendations for Services/Supports/Treatments Recommendations For Services/Supports/Treatments: Detox, Insurance claims handler, Medication Management, SAIOP (Substance Abuse Intensive Outpatient Program), CD-IOP Intensive Chemical Dependency Program, Individual Therapy  Disposition Recommendation per psychiatric provider: We recommend transfer to [facility]. Final disposition following provider assessment.    DSM5 Diagnoses: Patient Active Problem List   Diagnosis Date Noted   Alcohol use disorder, severe, in early remission (HCC) 12/21/2022   Cannabis use disorder 12/21/2022   Opioid use disorder, moderate, dependence (HCC) 12/21/2022  Microcytic anemia 12/21/2022   Hypocalcemia 12/21/2022   MDD (major depressive disorder), severe (HCC) 12/21/2022   Social anxiety disorder 12/21/2022   PTSD (post-traumatic stress disorder) 12/21/2022   Substance dependence with physiological dependence (HCC) 12/20/2022   Substance use disorder 12/20/2022   Methamphetamine use disorder, severe (HCC) 12/20/2022   Protein-calorie malnutrition, severe 07/29/2022   H/O Gastrojejunal ulcer with recurrent perforation 07/28/2022   Alcohol dependence (HCC) 07/28/2022   Peritonitis (HCC) 07/27/2022   Sciatica 06/03/2022   Perforated ulcer (HCC) 11/26/2021   Perforated viscus 09/01/2021   MDD (major depressive disorder), recurrent episode (HCC) 03/16/2017   Iron  deficiency anemia due to chronic blood loss 11/18/2016   Migraines 11/07/2016   S/P cesarean section 08/29/2014   Venereal disease 06/23/2012   Cervicalgia 04/21/2012   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   Postlaminectomy syndrome, lumbar region 06/26/2011   Lumbago 05/09/2011   DJD (degenerative joint disease), lumbar 05/08/2011   Seasonal and perennial allergic rhinitis 05/08/2011    Sacroiliac dysfunction 04/07/2011   Lumbosacral spondylosis without myelopathy 03/10/2011   Dizziness and giddiness 11/02/2010   Preventative health care 04/26/2010   SMOKER 03/29/2010   HYPERSOMNIA 02/08/2010   HYPERLIPIDEMIA 04/26/2009   Attention deficit disorder 04/26/2009   Pruritus ani 04/26/2009   GAD (generalized anxiety disorder) 02/01/2009   DEPRESSION 02/01/2009   COMMON MIGRAINE 02/01/2009   GERD 02/01/2009   CHOLELITHIASIS 02/01/2009     Referrals to Alternative Service(s): Referred to Alternative Service(s):   Place:   Date:   Time:    Referred to Alternative Service(s):   Place:   Date:   Time:    Referred to Alternative Service(s):   Place:   Date:   Time:    Referred to Alternative Service(s):   Place:   Date:   Time:     Orlena Garmon T, Counselor

## 2023-05-20 NOTE — ED Notes (Signed)
 LAB CALLED TO REPORT ABNORMAL LAB 6.7, NP ROY WILLIAMS NOTIFIED.

## 2023-05-20 NOTE — Progress Notes (Signed)
   05/20/23 1746  [place] Suicide Severity Rating Scale  1. In the past month - "Have you wished you were dead or wished you could go to sleep and not wake up?" Yes  2. In the past month - "Have you actually had any thoughts of killing yourself?" No  6. Have you ever done anything, started to do anything, or prepared to do anything to end your life?" No  C-SSRS RISK CATEGORY Low Risk  Patient location: [facility] Urgent Care/Facility Based Crisis Center  [facility] Urgent Care/Facility Based Crisis Center Suicide Precautions Interventions  [facility] Suicide Precautions Interventions Low Risk Interventions

## 2023-05-20 NOTE — ED Provider Notes (Signed)
 [facility] Continuous Assessment Admission H&P  Date: 05/20/23 Patient Name: Tina [name] MRN: [id number] Chief Complaint: wanting detox from substance abuse  Diagnoses:  Final diagnoses:  Polysubstance abuse (HCC)  Homelessness unspecified    HPI: Tina [name], 43 y/o female with a history of cocaine abuse, homelessness, bipolar, DM, presented to [facility] accompanied by her boyfriend according to the patient they both are homeless and needing somewhere to stay and they are looking for a long-term rehab.  Patient denies currently seeing a psychiatrist or therapist.  Denies currently taking any medications.  According to patient she uses ice and marijuana on a daily basis.  At this present moment patient does not show any sign of distress.  Does not pose a risk to herself or others.  Copied from triage notes: Tina [name] is a 43 yo female who presented today voluntarily accompanied by her "significant other" requesting detox from methamphetamine and cannabis. Her significant other is seeking treatment as well. Pt stated that her last use of both was earlier today. Hx of MDD, GAD, PTSD and per pt, Bipolar d/o. Pt denied any periods of manic symptoms when she is not using stimulants. Hx of polysubstance use. Pt denied SI, HI, self-harm, AVH and paranoia. Pt stated that she has been prescribed psychiatric medications in the past but stopped taking them about a year ago. Pt stated she was seeing an OP therapist until about 4 months ago. No current OP providers   Face-to-face observation of patient, patient is alert and oriented x 4, speech is clear, maintaining eye contact.  Patient denies SI, HI, AVH or paranoia.  According to patient she was prescribed psychiatric medicine in the past but have not been taking them for a long time.  Patient denies access to guns or wanting to hurt herself or others.   Recommend observation with possibly admission to [facility] when a bed  becomes available.   Pt  hemoglobin  came back at 6.7. (critical lab),  [facility] was notify and Dr Ranelle Buys MD will be accepting,  for treatment and medical clearance.  Pt can return after medical clearance.    Total Time spent with patient: 20 minutes  Musculoskeletal  Strength & Muscle Tone: within normal limits Gait & Station: normal Patient leans: N/A  Psychiatric Specialty Exam  Presentation General Appearance:  Disheveled  Eye Contact: Fair  Speech: Clear and Coherent  Speech Volume: Normal  Handedness: Right   Mood and Affect  Mood: Depressed  Affect: Congruent   Thought Process  Thought Processes: Coherent  Descriptions of Associations:Intact  Orientation:Full (Time, Place and Person)  Thought Content:WDL  Diagnosis of Schizophrenia or Schizoaffective disorder in past: No   Hallucinations:Hallucinations: None  Ideas of Reference:None  Suicidal Thoughts:Suicidal Thoughts: No  Homicidal Thoughts:Homicidal Thoughts: No   Sensorium  Memory: Immediate Fair  Judgment: Poor  Insight: Poor   Executive Functions  Concentration: Fair  Attention Span: Fair  Recall: Good  Fund of Knowledge: Good  Language: Good   Psychomotor Activity  Psychomotor Activity: Psychomotor Activity: Normal   Assets  Assets: Desire for Improvement; Housing   Sleep  Sleep: Sleep: Fair Number of Hours of Sleep: 6   Nutritional Assessment (For OBS and FBC admissions only) Has the patient had a weight loss or gain of 10 pounds or more in the last 3 months?: No Has the patient had a decrease in food intake/or appetite?: No Does the patient have dental problems?: No Does the patient have eating habits or behaviors that may  be indicators of an eating disorder including binging or inducing vomiting?: No Has the patient recently lost weight without trying?: 0 Has the patient been eating poorly because of a decreased appetite?: 0 Malnutrition Screening Tool Score:  0    Physical Exam HENT:     Head: Normocephalic.     Nose: Nose normal.  Eyes:     Pupils: Pupils are equal, round, and reactive to light.  Cardiovascular:     Rate and Rhythm: Normal rate.  Pulmonary:     Effort: Pulmonary effort is normal.  Musculoskeletal:        General: Normal range of motion.     Cervical back: Normal range of motion.  Neurological:     General: No focal deficit present.     Mental Status: She is alert.  Psychiatric:        Mood and Affect: Mood normal.        Thought Content: Thought content normal.        Judgment: Judgment normal.    Review of Systems  Constitutional: Negative.   HENT: Negative.    Eyes: Negative.   Respiratory: Negative.    Cardiovascular: Negative.   Gastrointestinal: Negative.   Genitourinary: Negative.   Musculoskeletal: Negative.   Skin: Negative.   Neurological: Negative.   Psychiatric/Behavioral:  Positive for depression and substance abuse. The patient is nervous/anxious.     Blood pressure 127/71, pulse 75, temperature 98 F (36.7 C), temperature source Oral, resp. rate 19, SpO2 100%. There is no height or weight on file to calculate BMI.  Past Psychiatric History: MDD, GAD, PTSD, bipolar disorder, polysubstance abuse  Is the patient at risk to self? No  Has the patient been a risk to self in the past 6 months? No .    Has the patient been a risk to self within the distant past? No   Is the patient a risk to others? No   Has the patient been a risk to others in the past 6 months? No   Has the patient been a risk to others within the distant past? No   Past Medical History: see chart   Family History: unknown   Social History: ICE, cocaine, marijuana  Last Labs:  Admission on 12/20/2022, Discharged on 12/23/2022  Component Date Value Ref Range Status   WBC 12/22/2022 5.5  4.0 - 10.5 K/uL Final   RBC 12/22/2022 4.76  3.87 - 5.11 MIL/uL Final   Hemoglobin 12/22/2022 9.3 (L)  12.0 - 15.0 g/dL Final   HCT  95/28/4132 32.6 (L)  36.0 - 46.0 % Final   MCV 12/22/2022 68.5 (L)  80.0 - 100.0 fL Final   MCH 12/22/2022 19.5 (L)  26.0 - 34.0 pg Final   MCHC 12/22/2022 28.5 (L)  30.0 - 36.0 g/dL Final   RDW 44/01/270 19.7 (H)  11.5 - 15.5 % Final   Platelets 12/22/2022 464 (H)  150 - 400 K/uL Final   REPEATED TO VERIFY   nRBC 12/22/2022 0.0  0.0 - 0.2 % Final   Neutrophils Relative % 12/22/2022 54  % Final   Neutro Abs 12/22/2022 3.0  1.7 - 7.7 K/uL Final   Lymphocytes Relative 12/22/2022 38  % Final   Lymphs Abs 12/22/2022 2.1  0.7 - 4.0 K/uL Final   Monocytes Relative 12/22/2022 6  % Final   Monocytes Absolute 12/22/2022 0.3  0.1 - 1.0 K/uL Final   Eosinophils Relative 12/22/2022 1  % Final   Eosinophils Absolute 12/22/2022 0.0  0.0 - 0.5 K/uL Final   Basophils Relative 12/22/2022 1  % Final   Basophils Absolute 12/22/2022 0.0  0.0 - 0.1 K/uL Final   WBC Morphology 12/22/2022 MORPHOLOGY UNREMARKABLE   Final   RBC Morphology 12/22/2022 MORPHOLOGY UNREMARKABLE   Final   Smear Review 12/22/2022 Normal platelet morphology   Final   Immature Granulocytes 12/22/2022 0  % Final   Abs Immature Granulocytes 12/22/2022 0.01  0.00 - 0.07 K/uL Final   Performed at [facility], [address], [place], [place] [address]   Sodium 12/22/2022 139  135 - 145 mmol/L Final   Potassium 12/22/2022 4.2  3.5 - 5.1 mmol/L Final   Chloride 12/22/2022 102  98 - 111 mmol/L Final   CO2 12/22/2022 29  22 - 32 mmol/L Final   Glucose, Bld 12/22/2022 91  70 - 99 mg/dL Final   Glucose reference range applies only to samples taken after fasting for at least 8 hours.   BUN 12/22/2022 12  6 - 20 mg/dL Final   Creatinine, Ser 12/22/2022 0.50  0.44 - 1.00 mg/dL Final   Calcium 28/41/3244 8.2 (L)  8.9 - 10.3 mg/dL Final   Total Protein 01/15/7251 6.1 (L)  6.5 - 8.1 g/dL Final   Albumin 66/44/0347 3.0 (L)  3.5 - 5.0 g/dL Final   AST 42/59/5638 22  15 - 41 U/L Final   ALT 12/22/2022 28  0 - 44 U/L Final   Alkaline  Phosphatase 12/22/2022 70  38 - 126 U/L Final   Total Bilirubin 12/22/2022 0.4  <1.2 mg/dL Final   GFR, Estimated 12/22/2022 >60  >60 mL/min Final   Comment: (NOTE) Calculated using the CKD-EPI Creatinine Equation (2021)    Anion gap 12/22/2022 8  5 - 15 Final   Performed at [facility], [address], [place], [place] [address]   Magnesium  12/22/2022 2.1  1.7 - 2.4 mg/dL Final   Performed at [facility], [address] [place], [place] [address]   Phosphorus 12/22/2022 4.5  2.5 - 4.6 mg/dL Final   Performed at [facility], [address], [place], [place] [address]   Iron  12/22/2022 14 (L)  28 - 170 ug/dL Final   TIBC 60/63/0160 480 (H)  250 - 450 ug/dL Final   Saturation Ratios 12/22/2022 3 (L)  10.4 - 31.8 % Final   UIBC 12/22/2022 466  ug/dL Final   Performed at [facility], [address], [place], [place] [address]   TSH 12/22/2022 1.287  0.350 - 4.500 uIU/mL Final   Comment: Performed by a 3rd Generation assay with a functional sensitivity of <=0.01 uIU/mL. Performed at [facility], [address] [place], [place] [address]    Free T4 12/22/2022 0.63  0.61 - 1.12 ng/dL Final   Comment: (NOTE) Biotin ingestion may interfere with free T4 tests. If the results are inconsistent with the TSH level, previous test results, or the clinical presentation, then consider biotin interference. If needed, order repeat testing after stopping biotin. Performed at [facility], [address] [place], [place] [address]    Ferritin 12/22/2022 3 (L)  11 - 307 ng/mL Final   Performed at [facility], [address] [place], [place] [address]   Vitamin B-12 12/22/2022 278  180 - 914 pg/mL Final   Comment: (NOTE) This assay is not validated for testing neonatal or myeloproliferative syndrome specimens for Vitamin B12 levels. Performed at  [facility], [address] [place], [place] [address]    Folate 12/22/2022 9.8  >5.9 ng/mL Final    Performed at [facility], [address] [place], [place] [address]   Vitamin B1 (Thiamine ) 12/22/2022 147.9  66.5 - 200.0 nmol/L Final   Comment: (NOTE) This test was developed and its performance characteristics determined by [facility]. It has not been cleared or approved by the Food and Drug Administration. Performed At: [facility] [address] [place], [place] [id number] Pearlean Botts MD WG:[id number]    Vit D, 25-Hydroxy 12/22/2022 21.06 (L)  30 - 100 ng/mL Final   Comment: (NOTE) Vitamin D  deficiency has been defined by the Institute of Medicine  and an Endocrine Society practice guideline as a level of serum 25-OH  vitamin D  less than 20 ng/mL (1,2). The Endocrine Society went on to  further define vitamin D  insufficiency as a level between 21 and 29  ng/mL (2).  1. IOM (Institute of Medicine). 2010. Dietary reference intakes for  calcium and D. [place]: [employer]. 2. Holick MF, Bink[place], Bischoff-Ferrari HA, et al. Evaluation,  treatment, and prevention of vitamin D  deficiency: an Endocrine  Society clinical practice guideline, JCEM. 2011 Jul; 96(7): 1911-30.  Performed at [facility], [address], [place], [place] [address]    LDH 12/22/2022 118  98 - 192 U/L Final   Performed at [facility], [address] [place], [place] [address]   Haptoglobin 12/22/2022 169  42 - 296 mg/dL Final   Comment: (NOTE) Performed At: [facility] [address] [place] [address] [place], [place] [id number] Pearlean Botts MD UU:[id number]   Admission on 12/20/2022, Discharged on 12/20/2022  Component Date Value Ref Range Status   WBC 12/20/2022 6.2  4.0 - 10.5 K/uL Final   RBC 12/20/2022 4.12  3.87 - 5.11 MIL/uL Final   Hemoglobin 12/20/2022 8.1 (L)  12.0 - 15.0 g/dL Final   Comment: Reticulocyte Hemoglobin testing may be clinically indicated, consider ordering this additional test [id number]    HCT 12/20/2022 28.1 (L)  36.0 -  46.0 % Final   MCV 12/20/2022 68.2 (L)  80.0 - 100.0 fL Final   MCH 12/20/2022 19.7 (L)  26.0 - 34.0 pg Final   MCHC 12/20/2022 28.8 (L)  30.0 - 36.0 g/dL Final   RDW 38/75/6433 19.8 (H)  11.5 - 15.5 % Final   Platelets 12/20/2022 444 (H)  150 - 400 K/uL Final   REPEATED TO VERIFY   nRBC 12/20/2022 0.0  0.0 - 0.2 % Final   Neutrophils Relative % 12/20/2022 70  % Final   Neutro Abs 12/20/2022 4.3  1.7 - 7.7 K/uL Final   Lymphocytes Relative 12/20/2022 28  % Final   Lymphs Abs 12/20/2022 1.7  0.7 - 4.0 K/uL Final   Monocytes Relative 12/20/2022 0  % Final   Monocytes Absolute 12/20/2022 0.0 (L)  0.1 - 1.0 K/uL Final   Eosinophils Relative 12/20/2022 1  % Final   Eosinophils Absolute 12/20/2022 0.1  0.0 - 0.5 K/uL Final   Basophils Relative 12/20/2022 1  % Final   Basophils Absolute 12/20/2022 0.1  0.0 - 0.1 K/uL Final   nRBC 12/20/2022 0  0 /100 WBC Final   Abs Immature Granulocytes 12/20/2022 0.00  0.00 - 0.07 K/uL Final   Performed at [facility], [address] [place], [place] [address]   Sodium 12/20/2022 138  135 - 145 mmol/L Final  Potassium 12/20/2022 4.6  3.5 - 5.1 mmol/L Final   Chloride 12/20/2022 103  98 - 111 mmol/L Final   CO2 12/20/2022 26  22 - 32 mmol/L Final   Glucose, Bld 12/20/2022 77  70 - 99 mg/dL Final   Glucose reference range applies only to samples taken after fasting for at least 8 hours.   BUN 12/20/2022 11  6 - 20 mg/dL Final   Creatinine, Ser 12/20/2022 0.44  0.44 - 1.00 mg/dL Final   Calcium 40/98/1191 8.4 (L)  8.9 - 10.3 mg/dL Final   Total Protein 47/82/9562 5.9 (L)  6.5 - 8.1 g/dL Final   Albumin 13/08/6576 3.1 (L)  3.5 - 5.0 g/dL Final   AST 46/96/2952 24  15 - 41 U/L Final   ALT 12/20/2022 33  0 - 44 U/L Final   Alkaline Phosphatase 12/20/2022 78  38 - 126 U/L Final   Total Bilirubin 12/20/2022 0.3  <1.2 mg/dL Final   GFR, Estimated 12/20/2022 >60  >60 mL/min Final   Comment: (NOTE) Calculated using the CKD-EPI Creatinine Equation  (2021)    Anion gap 12/20/2022 9  5 - 15 Final   Performed at [facility], [address] [place], [place] [address]   Hgb A1c MFr Bld 12/20/2022 5.7 (H)  4.8 - 5.6 % Final   Comment: (NOTE) Pre diabetes:          5.7%-6.4%  Diabetes:              >6.4%  Glycemic control for   <7.0% adults with diabetes    Mean Plasma Glucose 12/20/2022 116.89  mg/dL Final   Performed at [facility], [address] [place], [place] [address]   Alcohol, Ethyl (B) 12/20/2022 <10  <10 mg/dL Final   Comment: (NOTE) Lowest detectable limit for serum alcohol is 10 mg/dL.  For medical purposes only. Performed at [facility], [address], [place], [place] [address]    Magnesium  12/20/2022 2.3  1.7 - 2.4 mg/dL Final   Performed at [facility], [address] [place], [place] [address]   TSH 12/20/2022 1.120  0.350 - 4.500 uIU/mL Final   Comment: Performed by a 3rd Generation assay with a functional sensitivity of <=0.01 uIU/mL. Performed at [facility], [address], [place], [place] [address]   Admission on 12/19/2022, Discharged on 12/19/2022  Component Date Value Ref Range Status   Lipase 12/19/2022 48  11 - 51 U/L Final   Performed at [facility], [address], [place], [place] [address]   Sodium 12/19/2022 137  135 - 145 mmol/L Final   Potassium 12/19/2022 3.9  3.5 - 5.1 mmol/L Final   Chloride 12/19/2022 103  98 - 111 mmol/L Final   CO2 12/19/2022 24  22 - 32 mmol/L Final   Glucose, Bld 12/19/2022 89  70 - 99 mg/dL Final   Glucose reference range applies only to samples taken after fasting for at least 8 hours.   BUN 12/19/2022 19  6 - 20 mg/dL Final   Creatinine, Ser 12/19/2022 <0.30 (L)  0.44 - 1.00 mg/dL Final   Calcium 87/56/4332 8.3 (L)  8.9 - 10.3 mg/dL Final   Total Protein 95/18/8416 6.5  6.5 - 8.1 g/dL Final   Albumin 60/63/0160 3.5  3.5 - 5.0 g/dL Final   AST 10/93/2355 29  15 - 41 U/L Final   ALT 12/19/2022 41  0 -  44 U/L  Final   Alkaline Phosphatase 12/19/2022 85  38 - 126 U/L Final   Total Bilirubin 12/19/2022 0.5  <1.2 mg/dL Final   GFR, Estimated 12/19/2022 NOT CALCULATED  >60 mL/min Final   Comment: (NOTE) Calculated using the CKD-EPI Creatinine Equation (2021)    Anion gap 12/19/2022 10  5 - 15 Final   Performed at [facility], [address], [place], [place] [address]   WBC 12/19/2022 6.5  4.0 - 10.5 K/uL Final   RBC 12/19/2022 4.05  3.87 - 5.11 MIL/uL Final   Hemoglobin 12/19/2022 7.9 (L)  12.0 - 15.0 g/dL Final   Comment: Reticulocyte Hemoglobin testing may be clinically indicated, consider ordering this additional test [id number]    HCT 12/19/2022 28.2 (L)  36.0 - 46.0 % Final   MCV 12/19/2022 69.6 (L)  80.0 - 100.0 fL Final   MCH 12/19/2022 19.5 (L)  26.0 - 34.0 pg Final   MCHC 12/19/2022 28.0 (L)  30.0 - 36.0 g/dL Final   RDW 11/91/4782 19.7 (H)  11.5 - 15.5 % Final   Platelets 12/19/2022 434 (H)  150 - 400 K/uL Final   nRBC 12/19/2022 0.0  0.0 - 0.2 % Final   Performed at [facility], [address], [place], [place] [address]   hCG, Beta Chain, Quant, S 12/19/2022 1  <5 mIU/mL Final   Comment:          GEST. AGE      CONC.  (mIU/mL)   <=1 WEEK        5 - 50     2 WEEKS       50 - 500     3 WEEKS       100 - 10,000     4 WEEKS     1,000 - 30,000     5 WEEKS     3,500 - 115,000   6-8 WEEKS     12,000 - 270,000    12 WEEKS     15,000 - 220,000        FEMALE AND NON-PREGNANT FEMALE:     LESS THAN 5 mIU/mL Performed at [facility], [address], [place], [place] [address]    Fecal Occult Bld 12/19/2022 NEGATIVE  NEGATIVE Final    Allergies: Nsaids, Tramadol, Oxycodone , and Wellbutrin  [bupropion ]  Medications:  PTA Medications  Medication Sig   hydrOXYzine  (ATARAX ) 25 MG tablet Take 1 tablet (25 mg total) by mouth 3 (three) times daily as needed for anxiety.   mirtazapine  (REMERON ) 15 MG tablet Take 1 tablet (15  mg total) by mouth at bedtime.      Medical Decision Making  Observation unit    Recommendations  Based on my evaluation the patient does not appear to have an emergency medical condition.  Dorthea Gauze, NP 05/20/23  8:21 PM

## 2023-05-21 ENCOUNTER — Emergency Department (HOSPITAL_COMMUNITY): Payer: MEDICAID

## 2023-05-21 ENCOUNTER — Observation Stay (HOSPITAL_COMMUNITY)
Admission: EM | Admit: 2023-05-21 | Discharge: 2023-05-22 | Disposition: A | Discharge status: Home / Self Care | Payer: MEDICAID | Attending: Internal Medicine | Admitting: Internal Medicine

## 2023-05-21 ENCOUNTER — Other Ambulatory Visit: Payer: Self-pay

## 2023-05-21 ENCOUNTER — Encounter (HOSPITAL_COMMUNITY): Payer: Self-pay | Admitting: *Deleted

## 2023-05-21 DIAGNOSIS — R45851 Suicidal ideations: Secondary | ICD-10-CM | POA: Diagnosis not present

## 2023-05-21 DIAGNOSIS — S32039A Unspecified fracture of third lumbar vertebra, initial encounter for closed fracture: Secondary | ICD-10-CM | POA: Insufficient documentation

## 2023-05-21 DIAGNOSIS — R109 Unspecified abdominal pain: Secondary | ICD-10-CM | POA: Insufficient documentation

## 2023-05-21 DIAGNOSIS — E559 Vitamin D deficiency, unspecified: Secondary | ICD-10-CM | POA: Insufficient documentation

## 2023-05-21 DIAGNOSIS — F339 Major depressive disorder, recurrent, unspecified: Secondary | ICD-10-CM | POA: Diagnosis not present

## 2023-05-21 DIAGNOSIS — F1920 Other psychoactive substance dependence, uncomplicated: Secondary | ICD-10-CM | POA: Insufficient documentation

## 2023-05-21 DIAGNOSIS — F1490 Cocaine use, unspecified, uncomplicated: Secondary | ICD-10-CM | POA: Diagnosis not present

## 2023-05-21 DIAGNOSIS — D649 Anemia, unspecified: Secondary | ICD-10-CM | POA: Diagnosis present

## 2023-05-21 DIAGNOSIS — S32029A Unspecified fracture of second lumbar vertebra, initial encounter for closed fracture: Secondary | ICD-10-CM | POA: Insufficient documentation

## 2023-05-21 DIAGNOSIS — F39 Unspecified mood [affective] disorder: Secondary | ICD-10-CM | POA: Insufficient documentation

## 2023-05-21 DIAGNOSIS — Z5900 Homelessness unspecified: Secondary | ICD-10-CM | POA: Diagnosis not present

## 2023-05-21 DIAGNOSIS — S32049A Unspecified fracture of fourth lumbar vertebra, initial encounter for closed fracture: Secondary | ICD-10-CM | POA: Diagnosis not present

## 2023-05-21 DIAGNOSIS — N390 Urinary tract infection, site not specified: Secondary | ICD-10-CM | POA: Insufficient documentation

## 2023-05-21 DIAGNOSIS — S32009A Unspecified fracture of unspecified lumbar vertebra, initial encounter for closed fracture: Secondary | ICD-10-CM

## 2023-05-21 DIAGNOSIS — D500 Iron deficiency anemia secondary to blood loss (chronic): Secondary | ICD-10-CM

## 2023-05-21 LAB — DIFFERENTIAL
Abs Immature Granulocytes: 0.02 10*3/uL (ref 0.00–0.07)
Basophils Absolute: 0 10*3/uL (ref 0.0–0.1)
Basophils Relative: 1 %
Eosinophils Absolute: 0 10*3/uL (ref 0.0–0.5)
Eosinophils Relative: 0 %
Immature Granulocytes: 0 %
Lymphocytes Relative: 25 %
Lymphs Abs: 1.2 10*3/uL (ref 0.7–4.0)
Monocytes Absolute: 0.4 10*3/uL (ref 0.1–1.0)
Monocytes Relative: 9 %
Neutro Abs: 3 10*3/uL (ref 1.7–7.7)
Neutrophils Relative %: 65 %

## 2023-05-21 LAB — PREPARE RBC (CROSSMATCH)

## 2023-05-21 LAB — CBC
HCT: 25.1 % — ABNORMAL LOW (ref 36.0–46.0)
Hemoglobin: 6.7 g/dL — CL (ref 12.0–15.0)
MCH: 16.5 pg — ABNORMAL LOW (ref 26.0–34.0)
MCHC: 26.7 g/dL — ABNORMAL LOW (ref 30.0–36.0)
MCV: 62 fL — ABNORMAL LOW (ref 80.0–100.0)
Platelets: 350 10*3/uL (ref 150–400)
RBC: 4.05 MIL/uL (ref 3.87–5.11)
RDW: 19.4 % — ABNORMAL HIGH (ref 11.5–15.5)
WBC: 4.8 10*3/uL (ref 4.0–10.5)
nRBC: 0 % (ref 0.0–0.2)

## 2023-05-21 LAB — HEMOGLOBIN AND HEMATOCRIT, BLOOD
HCT: 28.8 % — ABNORMAL LOW (ref 36.0–46.0)
Hemoglobin: 8.1 g/dL — ABNORMAL LOW (ref 12.0–15.0)

## 2023-05-21 LAB — HCG, SERUM, QUALITATIVE: Preg, Serum: NEGATIVE

## 2023-05-21 LAB — BASIC METABOLIC PANEL WITH GFR
Anion gap: 6 (ref 5–15)
BUN: 9 mg/dL (ref 6–20)
CO2: 23 mmol/L (ref 22–32)
Calcium: 7.9 mg/dL — ABNORMAL LOW (ref 8.9–10.3)
Chloride: 109 mmol/L (ref 98–111)
Creatinine, Ser: 0.4 mg/dL — ABNORMAL LOW (ref 0.44–1.00)
GFR, Estimated: >60 mL/min (ref >60)
Glucose, Bld: 93 mg/dL (ref 70–99)
Potassium: 3.4 mmol/L — ABNORMAL LOW (ref 3.5–5.1)
Sodium: 138 mmol/L (ref 135–145)

## 2023-05-21 LAB — IRON AND TIBC
Iron: 17 ug/dL — ABNORMAL LOW (ref 28–170)
Saturation Ratios: 3 % — ABNORMAL LOW (ref 10.4–31.8)
TIBC: 518 ug/dL — ABNORMAL HIGH (ref 250–450)
UIBC: 501 ug/dL

## 2023-05-21 LAB — POC OCCULT BLOOD, ED: Fecal Occult Bld: NEGATIVE

## 2023-05-21 LAB — FERRITIN: Ferritin: 5 ng/mL — ABNORMAL LOW (ref 11–307)

## 2023-05-21 MED ORDER — HYDROMORPHONE HCL 2 MG PO TABS
1.0000 mg | ORAL_TABLET | ORAL | Status: DC | PRN
  Administered 2023-05-21 – 2023-05-22 (×3): 1 mg via ORAL
  Filled 2023-05-21 (×3): qty 1

## 2023-05-21 MED ORDER — ACETAMINOPHEN 650 MG RE SUPP: 650.0000 mg | Freq: Four times a day (QID) | RECTAL | Status: DC | PRN

## 2023-05-21 MED ORDER — ACETAMINOPHEN 325 MG PO TABS
650.0000 mg | ORAL_TABLET | Freq: Four times a day (QID) | ORAL | Status: DC | PRN
  Administered 2023-05-21: 650 mg via ORAL
  Filled 2023-05-21: qty 2

## 2023-05-21 MED ORDER — SODIUM CHLORIDE 0.9% IV SOLUTION: Freq: Once | INTRAVENOUS | Status: AC

## 2023-05-21 MED ORDER — NICOTINE 21 MG/24HR TD PT24
21.0000 mg | MEDICATED_PATCH | Freq: Every day | TRANSDERMAL | Status: DC
  Administered 2023-05-21 – 2023-05-22 (×2): 21 mg via TRANSDERMAL
  Filled 2023-05-21 (×2): qty 1

## 2023-05-21 MED ORDER — FENTANYL CITRATE PF 50 MCG/ML IJ SOSY
25.0000 ug | PREFILLED_SYRINGE | Freq: Once | INTRAMUSCULAR | Status: AC
  Administered 2023-05-21: 25 ug via INTRAVENOUS
  Filled 2023-05-21: qty 1

## 2023-05-21 MED ORDER — VITAMIN D 25 MCG (1000 UNIT) PO TABS
1000.0000 [IU] | ORAL_TABLET | Freq: Every day | ORAL | Status: DC
  Administered 2023-05-21 – 2023-05-22 (×2): 1000 [IU] via ORAL
  Filled 2023-05-21 (×2): qty 1

## 2023-05-21 MED ORDER — IOHEXOL 350 MG/ML SOLN
75.0000 mL | Freq: Once | INTRAVENOUS | Status: AC | PRN
  Administered 2023-05-21: 75 mL via INTRAVENOUS
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
## 2023-05-21 NOTE — ED Notes (Signed)
Pt cannot urinate at this time
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
## 2023-05-21 NOTE — Progress Notes (Signed)
 Orthopedic Tech Progress Note Patient Details:  Tina [name] [birth year] [id number]  Ortho Devices Type of Ortho Device: Thoracolumbar corset (TLSO) Ortho Device/Splint Location: Back Ortho Device/Splint Interventions: Ordered, Adjustment      [name] [name] 05/21/2023, 1:21 PM
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
## 2023-05-21 NOTE — Plan of Care (Signed)
   Problem: Education: Goal: Knowledge of General Education information will improve Description: Including pain rating scale, medication(s)/side effects and non-pharmacologic comfort measures Outcome: Progressing   Problem: Health Behavior/Discharge Planning: Goal: Ability to manage health-related needs will improve Outcome: Progressing   Problem: Clinical Measurements: Goal: Will remain free from infection Outcome: Progressing   Problem: Activity: Goal: Risk for activity intolerance will decrease Outcome: Progressing   Problem: Nutrition: Goal: Adequate nutrition will be maintained Outcome: Progressing   Problem: Coping: Goal: Level of anxiety will decrease Outcome: Progressing

## 2023-05-21 NOTE — ED Notes (Signed)
 Pt refused to wear brace to get out of the bed

## 2023-05-21 NOTE — Discharge Instructions (Signed)
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
## 2023-05-22 ENCOUNTER — Other Ambulatory Visit (HOSPITAL_COMMUNITY): Payer: Self-pay

## 2023-05-22 ENCOUNTER — Telehealth: Payer: Self-pay | Admitting: Internal Medicine

## 2023-05-22 ENCOUNTER — Telehealth: Payer: Self-pay | Admitting: Pharmacy Technician

## 2023-05-22 DIAGNOSIS — R45851 Suicidal ideations: Secondary | ICD-10-CM

## 2023-05-22 DIAGNOSIS — F1490 Cocaine use, unspecified, uncomplicated: Secondary | ICD-10-CM | POA: Diagnosis not present

## 2023-05-22 DIAGNOSIS — Z5900 Homelessness unspecified: Secondary | ICD-10-CM

## 2023-05-22 DIAGNOSIS — D649 Anemia, unspecified: Secondary | ICD-10-CM

## 2023-05-22 DIAGNOSIS — F339 Major depressive disorder, recurrent, unspecified: Secondary | ICD-10-CM

## 2023-05-22 LAB — URINALYSIS, ROUTINE W REFLEX MICROSCOPIC
Bilirubin Urine: NEGATIVE
Glucose, UA: NEGATIVE mg/dL
Hgb urine dipstick: NEGATIVE
Ketones, ur: NEGATIVE mg/dL
Nitrite: POSITIVE — AB
Protein, ur: NEGATIVE mg/dL
Specific Gravity, Urine: 1.016 (ref 1.005–1.030)
pH: 6 (ref 5.0–8.0)

## 2023-05-22 LAB — TYPE AND SCREEN
ABO/RH(D): O POS
Antibody Screen: NEGATIVE
Unit division: 0
Unit division: 0

## 2023-05-22 LAB — BPAM RBC
Blood Product Expiration Date: 202506062359
Blood Product Expiration Date: 202506062359
ISSUE DATE / TIME: 202505080452
ISSUE DATE / TIME: 202505080803
Unit Type and Rh: 5100
Unit Type and Rh: 5100

## 2023-05-22 LAB — CBC
HCT: 29.6 % — ABNORMAL LOW (ref 36.0–46.0)
Hemoglobin: 8.4 g/dL — ABNORMAL LOW (ref 12.0–15.0)
MCH: 18.5 pg — ABNORMAL LOW (ref 26.0–34.0)
MCHC: 28.4 g/dL — ABNORMAL LOW (ref 30.0–36.0)
MCV: 65.1 fL — ABNORMAL LOW (ref 80.0–100.0)
Platelets: 285 10*3/uL (ref 150–400)
RBC: 4.55 MIL/uL (ref 3.87–5.11)
RDW: 24.8 % — ABNORMAL HIGH (ref 11.5–15.5)
WBC: 4.1 10*3/uL (ref 4.0–10.5)
nRBC: 0 % (ref 0.0–0.2)

## 2023-05-22 LAB — BASIC METABOLIC PANEL WITH GFR
Anion gap: 8 (ref 5–15)
BUN: 7 mg/dL (ref 6–20)
CO2: 24 mmol/L (ref 22–32)
Calcium: 7.8 mg/dL — ABNORMAL LOW (ref 8.9–10.3)
Chloride: 109 mmol/L (ref 98–111)
Creatinine, Ser: 0.5 mg/dL (ref 0.44–1.00)
GFR, Estimated: >60 mL/min (ref >60)
Glucose, Bld: 86 mg/dL (ref 70–99)
Potassium: 3.5 mmol/L (ref 3.5–5.1)
Sodium: 141 mmol/L (ref 135–145)

## 2023-05-22 MED ORDER — VITAMIN D3 25 MCG PO TABS
1000.0000 [IU] | ORAL_TABLET | Freq: Every day | ORAL | 0 refills | Status: AC
  Filled 2023-05-22: qty 30, 30d supply, fill #0

## 2023-05-22 MED ORDER — SULFAMETHOXAZOLE-TRIMETHOPRIM 800-160 MG PO TABS
1.0000 | ORAL_TABLET | Freq: Two times a day (BID) | ORAL | 0 refills | Status: AC
  Filled 2023-05-22: qty 6, 3d supply, fill #0

## 2023-05-22 MED ORDER — FOLIC ACID 1 MG PO TABS: 1.0000 mg | ORAL_TABLET | Freq: Every day | ORAL | Status: DC

## 2023-05-22 MED ORDER — FERROUS SULFATE 325 (65 FE) MG PO TABS
325.0000 mg | ORAL_TABLET | ORAL | 2 refills | Status: DC
Start: 2023-05-22 — End: 2023-07-02
  Filled 2023-05-22: qty 15, 30d supply, fill #0

## 2023-05-22 MED ORDER — SODIUM CHLORIDE 0.9 % IV SOLN
100.0000 mg | Freq: Once | INTRAVENOUS | Status: AC
  Administered 2023-05-22: 100 mg via INTRAVENOUS
  Filled 2023-05-22 (×2): qty 5

## 2023-05-22 MED ORDER — FERROUS SULFATE 325 (65 FE) MG PO TABS
325.0000 mg | ORAL_TABLET | Freq: Every day | ORAL | Status: DC
Start: 2023-05-23 — End: 2023-05-22

## 2023-05-22 NOTE — Telephone Encounter (Signed)
 Patient referred to infusion pharmacy team for ambulatory infusion of IV iron .  Insurance - [employer]  Dx code - D50.0  IV Iron  Therapy - Feraheme 510 mg IV x 2  Infusion appointments - Scheduling team will schedule patient as soon as possible.   Tina [name] D. Elya Diloreto, PharmD
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
## 2023-05-22 NOTE — TOC Transition Note (Addendum)
 Transition of Care [facility]) - Discharge Note   Patient Details  Name: Tina [name] MRN: [id number] Date of Birth: [birth year]  Transition [facility]) CM/SW Contact:  Roswell Ndiaye A [place], LCSW Phone Number: 05/22/2023, 2:22 PM   Clinical Narrative:     CSW spoke with pt at beside.   She has completed referral with [facility]. Possible admission early next week, stated beds are available.  She informed CSW that she could stay with her father for the weekend/week while waiting to hear back from [facility]. She also said that she had means to get there once accepted to program.   Stated that her father or brother can provide transportation, sometime after 4pm for pickup.   CSW  provided community resources at bedside. Additional resources in pt's AVS.   No other needs identified at this time. TOC will sign off, please consult again if TOC needs arise.    Final next level of care: Home/Self Care Barriers to Discharge: Barriers Resolved   Patient Goals and CMS Choice            Discharge Placement              Patient chooses bed at:  (Going to her father's home) Patient to be transferred to facility by: Family providing transportation Name of family member notified: Pt declined collateral contact Patient and family notified of of transfer: 05/22/23  Discharge Plan and Services Additional resources added to the After Visit Summary for                                       Social Drivers of Health (SDOH) Interventions SDOH Screenings   Food Insecurity: Food Insecurity Present (05/21/2023)  Housing: High Risk (05/21/2023)  Transportation Needs: Unmet Transportation Needs (05/21/2023)  Utilities: Not At Risk (12/20/2022)  Depression (PHQ2-9): Low Risk  (12/23/2022)  Recent Concern: Depression (PHQ2-9) - Medium Risk (12/22/2022)  Financial Resource Strain: Patient Declined (04/02/2022)   Received from [facility]  Physical Activity: Sufficiently  Active (01/13/2021)   Received from [facility], [facility]  Social Connections: Socially Isolated (05/21/2023)  Stress: Stress Concern Present (09/01/2021)   Received from [facility], [facility]  Tobacco Use: Medium Risk (05/21/2023)     Readmission Risk Interventions     No data to display
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
## 2023-05-22 NOTE — Telephone Encounter (Signed)
 Auth Submission: NO AUTH NEEDED Site of care: Site of care: [facility] Payer: [employer] Medication & CPT/J Code(s) submitted: Feraheme (ferumoxytol) [id number] Route of submission (phone, fax, portal):  Phone # Fax # Auth type: Buy/Bill PB Units/visits requested: X2 DOSES Reference number:  Approval from: 05/22/23 to 09/22/23
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
## 2023-05-26 ENCOUNTER — Encounter: Payer: MEDICAID | Admitting: Student

## 2023-05-26 ENCOUNTER — Encounter: Payer: Self-pay | Admitting: Pulmonary Disease
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
## 2023-05-27 ENCOUNTER — Encounter: Payer: MEDICAID | Admitting: Student

## 2023-05-29 ENCOUNTER — Ambulatory Visit: Payer: MEDICAID

## 2023-06-03 ENCOUNTER — Encounter: Payer: MEDICAID | Admitting: Student

## 2023-06-03 ENCOUNTER — Telehealth: Payer: Self-pay | Admitting: General Practice

## 2023-06-03 NOTE — Telephone Encounter (Signed)
 Patient was contacted this afternoon via telephone due to no showing her appt.  No answer, but was able to leave detailed message to call our office back to reschedule.

## 2023-06-05 ENCOUNTER — Ambulatory Visit: Payer: MEDICAID

## 2023-06-11 ENCOUNTER — Ambulatory Visit (INDEPENDENT_AMBULATORY_CARE_PROVIDER_SITE_OTHER): Payer: MEDICAID

## 2023-06-11 VITALS — BP 117/75 | HR 96 | Temp 98.4°F | Resp 16 | Ht 67.0 in | Wt 146.8 lb

## 2023-06-11 DIAGNOSIS — D509 Iron deficiency anemia, unspecified: Secondary | ICD-10-CM | POA: Diagnosis not present

## 2023-06-11 DIAGNOSIS — D500 Iron deficiency anemia secondary to blood loss (chronic): Secondary | ICD-10-CM

## 2023-06-11 MED ORDER — SODIUM CHLORIDE 0.9 % IV SOLN
510.0000 mg | Freq: Once | INTRAVENOUS | Status: AC
  Administered 2023-06-11: 510 mg via INTRAVENOUS
  Filled 2023-06-11: qty 17

## 2023-06-11 NOTE — Patient Instructions (Addendum)
 Tina [name]

## 2023-06-11 NOTE — Progress Notes (Signed)
 Diagnosis: Iron  Deficiency Anemia  Provider:  Mannam, Praveen MD  Procedure: IV Infusion  IV Type: Peripheral, IV Location: R Antecubital  Feraheme (Ferumoxytol), Dose: 510 mg  Infusion Start Time: 1304  Infusion Stop Time: 1325  Post Infusion IV Care: Observation period completed  Discharge: Condition: Good, Destination: Home . AVS Declined  Performed by:  Lendel Quant, RN

## 2023-06-15 NOTE — Telephone Encounter (Signed)
 HFU resch/   Name: [name], Tina Diane MRN: [id number]  Date: 07/02/2023 Status: Sch  Time: 3:45 PM Length: 30  Visit Type: OPEN ESTABLISHED [726] Copay: $0.00  Provider: Cleven Dallas, DO       opied from CRM [phone]. Topic: Appointments - Scheduling Inquiry for Clinic >> Jun 12, 2023 12:35 PM [name] wrote: Reason for CRM: Attempted to assist the patient with rescheduling her previous no show appointment, "Block match required for NEW PT - OFFICE VISIT but no matching blocks could be found for [name], MD." Unable to proceed with rescheduling due to the patient being listed as a new-patient. Advised someone from the office will follow-up on this by Monday due to the early closing office hours. PT understood.  Callback [phone]

## 2023-06-18 ENCOUNTER — Ambulatory Visit: Payer: MEDICAID

## 2023-07-02 ENCOUNTER — Ambulatory Visit: Payer: MEDICAID | Admitting: Student

## 2023-07-02 VITALS — BP 105/71 | HR 89 | Temp 98.6°F | Ht 67.0 in | Wt 148.4 lb

## 2023-07-02 DIAGNOSIS — S32009D Unspecified fracture of unspecified lumbar vertebra, subsequent encounter for fracture with routine healing: Secondary | ICD-10-CM | POA: Diagnosis not present

## 2023-07-02 DIAGNOSIS — F322 Major depressive disorder, single episode, severe without psychotic features: Secondary | ICD-10-CM | POA: Diagnosis not present

## 2023-07-02 DIAGNOSIS — N939 Abnormal uterine and vaginal bleeding, unspecified: Secondary | ICD-10-CM | POA: Diagnosis not present

## 2023-07-02 DIAGNOSIS — K219 Gastro-esophageal reflux disease without esophagitis: Secondary | ICD-10-CM

## 2023-07-02 DIAGNOSIS — R7303 Prediabetes: Secondary | ICD-10-CM | POA: Insufficient documentation

## 2023-07-02 DIAGNOSIS — D500 Iron deficiency anemia secondary to blood loss (chronic): Secondary | ICD-10-CM

## 2023-07-02 DIAGNOSIS — F332 Major depressive disorder, recurrent severe without psychotic features: Secondary | ICD-10-CM

## 2023-07-02 DIAGNOSIS — S32009A Unspecified fracture of unspecified lumbar vertebra, initial encounter for closed fracture: Secondary | ICD-10-CM | POA: Insufficient documentation

## 2023-07-02 LAB — GLUCOSE, CAPILLARY: Glucose-Capillary: 114 mg/dL — ABNORMAL HIGH (ref 70–99)

## 2023-07-02 MED ORDER — PANTOPRAZOLE SODIUM 40 MG PO TBEC
40.0000 mg | DELAYED_RELEASE_TABLET | Freq: Every day | ORAL | 1 refills | Status: DC
Start: 2023-07-02 — End: 2023-09-03

## 2023-07-02 MED ORDER — GABAPENTIN 300 MG PO CAPS: 600.0000 mg | ORAL_CAPSULE | Freq: Two times a day (BID) | ORAL | 3 refills | Status: DC

## 2023-07-02 MED ORDER — FERROUS SULFATE 325 (65 FE) MG PO TABS: 325.0000 mg | ORAL_TABLET | ORAL | 1 refills | Status: DC

## 2023-07-02 MED ORDER — HYDROXYZINE HCL 10 MG PO TABS: 10.0000 mg | ORAL_TABLET | Freq: Three times a day (TID) | ORAL | 0 refills | Status: DC | PRN

## 2023-07-02 MED ORDER — FLUOXETINE HCL 20 MG PO CAPS: 20.0000 mg | ORAL_CAPSULE | Freq: Every day | ORAL | 2 refills | Status: DC

## 2023-07-02 NOTE — Assessment & Plan Note (Signed)
 Patient has history of multiple mood disorder diagnoses and has previously been on an SSRI.  After leaving the hospital recently she has moved from a stressful group home environment and now lives with her family but has also had recent stressors due to her father needing heart surgery.  Discussed counseling, referral to psychiatry, and medications.  She currently is getting referral to counseling through her insurance.  We will send referral to psychiatry and start medications today.  Risks, benefits, and alternatives were discussed. - Start fluoxetine  20 mg daily and hydroxyzine  10 mg 3 times daily as needed - Referral to psychiatry - If unable to get counseling through her insurance she may get counseling here at the internal medicine center
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
## 2023-07-02 NOTE — Assessment & Plan Note (Signed)
 Presents for hospital follow-up after being admitted from 05/21/2023 to 05/22/2023 for symptomatic iron  deficiency anemia.  She was given 2 units of packed red blood cells and a dose of IV iron  then discharged with oral iron  and referral for outpatient IV iron .  She has gotten 1 infusion of IV iron  and unfortunately had to reschedule her second because her father had to go in for heart surgery. - CBC today - Continue IV iron  and oral iron  - Return in 2 months for repeat iron  panel and CBC

## 2023-07-02 NOTE — Patient Instructions (Signed)
 Thank you, Ms.Shellye Diane Dubiel, for allowing us  to provide your care today. Today we discussed . . .  > Iron  deficiency anemia       - Please continue to take your oral iron  and set up your next IV iron  infusion.  We will check your blood counts today and I will call you if there is anything to change based on the results.  I would also like you to follow-up with OB/GYN for the spotting that you are having.  Let us  know if you have not heard from them in the next few weeks. > Anxiety and depression       - Start taking fluoxetine  20 mg daily and hydroxyzine  10 mg 3 times daily as needed.  I have also sent a referral to psychiatry.  If you have any issues getting into counseling please let us  know.  We have a counselor here that you can see as well. > Back pain       - Please continue to take Tylenol  1000 mg every 8 hours and you can use topical therapies such as lidocaine  patches or Voltaren  gel as well as heat/ice as needed.  I have also refilled your gabapentin  that you can take 600 mg twice daily.   I have ordered the following labs for you:  Lab Orders         CBC with Diff         Glucose, capillary       Referrals ordered today:   Referral Orders         Ambulatory referral to Obstetrics / Gynecology         Ambulatory referral to Psychiatry        Follow up: 2 months    Remember:  Should you have any questions or concerns please call the internal medicine clinic at [phone].     Cleven Dallas, DO [facility]

## 2023-07-02 NOTE — Assessment & Plan Note (Signed)
 Continues on pantoprazole  40 mg daily.  Refilled today

## 2023-07-02 NOTE — Progress Notes (Signed)
 CC: Hospital Follow Up after admission from 05/21/2023 to 05/22/2023 for symptomatic anemia  HPI:  Tina [name] is a 43 y.o. female with pertinent PMH of polysubstance use disorder, PTSD, bipolar disorder, anxiety/depression, and prior abnormal uterine bleeding s/p uterine ablation who presents as above. Please see assessment and plan below for further details.  Medications: Current Outpatient Medications  Medication Instructions   ferrous sulfate  325 mg, Oral, Every other day   FLUoxetine  (PROZAC ) 20 mg, Oral, Daily   gabapentin  (NEURONTIN ) 600 mg, Oral, 2 times daily   hydrOXYzine  (ATARAX ) 10 mg, Oral, 3 times daily PRN   pantoprazole  (PROTONIX ) 40 mg, Oral, Daily     Review of Systems:   Pertinent items noted in HPI and/or A&P.  Physical Exam:  Vitals:   07/02/23 1541  BP: 105/71  Pulse: 89  Temp: 98.6 F (37 C)  TempSrc: Oral  SpO2: 98%  Weight: 148 lb 6.4 oz (67.3 kg)  Height: 5' 7 (1.702 m)    Constitutional: Well-appearing adult female. In no acute distress. HEENT: Normocephalic, atraumatic, Sclera non-icteric, PERRL, EOM intact Cardio:Regular rate and rhythm. 2+ bilateral radial pulses. Pulm:Clear to auscultation bilaterally. Normal work of breathing on room air. UJW:[id number] for extremity edema. Skin:Warm and dry. Neuro:Alert and oriented x3. No focal deficit noted. Psych:Pleasant mood and affect.   Assessment & Plan:   GERD Continues on pantoprazole  40 mg daily.  Refilled today  Iron  deficiency anemia due to chronic blood loss Presents for hospital follow-up after being admitted from 05/21/2023 to 05/22/2023 for symptomatic iron  deficiency anemia.  She was given 2 units of packed red blood cells and a dose of IV iron  then discharged with oral iron  and referral for outpatient IV iron .  She has gotten 1 infusion of IV iron  and unfortunately had to reschedule her second because her father had to go in for heart surgery. - CBC today - Continue IV iron   and oral iron  - Return in 2 months for repeat iron  panel and CBC  MDD (major depressive disorder), severe (HCC) Patient has history of multiple mood disorder diagnoses and has previously been on an SSRI.  After leaving the hospital recently she has moved from a stressful group home environment and now lives with her family but has also had recent stressors due to her father needing heart surgery.  Discussed counseling, referral to psychiatry, and medications.  She currently is getting referral to counseling through her insurance.  We will send referral to psychiatry and start medications today.  Risks, benefits, and alternatives were discussed. - Start fluoxetine  20 mg daily and hydroxyzine  10 mg 3 times daily as needed - Referral to psychiatry - If unable to get counseling through her insurance she may get counseling here at the internal medicine center  Closed fracture of transverse process of lumbar vertebra (HCC) During her recent hospitalization she was found to have acute fractures of the lumbar transverse processes that did not require surgical intervention or further follow-up by a surgeon.  She was discharged with a TSLO brace however this was taken from her at the group home.  She has been using Tylenol  1000 mg every 8 hours with some benefit as well as gabapentin  600 mg every 12 hours. - Continue Tylenol  1000 mg every 8 hours and gabapentin  600 mg every 12 hours - Recommended topical therapies such as lidocaine /Voltaren /heat/ice - DME referral for replacement TSLO brace  Abnormal uterine bleeding (AUB) Patient has a history of abnormal uterine bleeding with eventual endometrial ablation  in the past but has not seen an OB/GYN in many years.  Prior to being hospitalized she did have spotting every week or 2 which was abnormal for her.  This does not seem to be the source of her iron  deficiency anemia but she will need further evaluation by OB/GYN. - Referral to OB/GYN    Patient  discussed with Dr. Driscilla George  Cleven Dallas, DO [facility] Internal Medicine Resident PGY-2 Clinic Phone: [phone] Please contact the on call pager at [phone] for any urgent or emergent needs.

## 2023-07-02 NOTE — Assessment & Plan Note (Signed)
 Patient has a history of abnormal uterine bleeding with eventual endometrial ablation in the past but has not seen an OB/GYN in many years.  Prior to being hospitalized she did have spotting every week or 2 which was abnormal for her.  This does not seem to be the source of her iron  deficiency anemia but she will need further evaluation by OB/GYN. - Referral to OB/GYN

## 2023-07-03 LAB — CBC WITH DIFFERENTIAL/PLATELET
Basophils Absolute: 0 10*3/uL (ref 0.0–0.2)
Basos: 1 %
EOS (ABSOLUTE): 0 10*3/uL (ref 0.0–0.4)
Eos: 0 %
Hematocrit: 40.5 % (ref 34.0–46.6)
Hemoglobin: 12 g/dL (ref 11.1–15.9)
Immature Grans (Abs): 0 10*3/uL (ref 0.0–0.1)
Immature Granulocytes: 0 %
Lymphocytes Absolute: 1.8 10*3/uL (ref 0.7–3.1)
Lymphs: 36 %
MCH: 21.7 pg — ABNORMAL LOW (ref 26.6–33.0)
MCHC: 29.6 g/dL — ABNORMAL LOW (ref 31.5–35.7)
MCV: 73 fL — ABNORMAL LOW (ref 79–97)
Monocytes Absolute: 0.4 10*3/uL (ref 0.1–0.9)
Monocytes: 7 %
Neutrophils Absolute: 2.8 10*3/uL (ref 1.4–7.0)
Neutrophils: 56 %
Platelets: 288 10*3/uL (ref 150–450)
RBC: 5.52 x10E6/uL — ABNORMAL HIGH (ref 3.77–5.28)
RDW: 29.6 % — ABNORMAL HIGH (ref 11.7–15.4)
WBC: 5 10*3/uL (ref 3.4–10.8)

## 2023-07-08 ENCOUNTER — Ambulatory Visit: Payer: Self-pay | Admitting: Student

## 2023-07-08 DIAGNOSIS — D500 Iron deficiency anemia secondary to blood loss (chronic): Secondary | ICD-10-CM

## 2023-07-08 DIAGNOSIS — R1084 Generalized abdominal pain: Secondary | ICD-10-CM

## 2023-07-08 NOTE — Progress Notes (Signed)
 Internal Medicine Clinic Attending  Case discussed with the resident at the time of the visit.  We reviewed the resident's history and exam and pertinent patient test results.  I agree with the assessment, diagnosis, and plan of care documented in the resident's note.

## 2023-07-08 NOTE — Progress Notes (Signed)
 Called and discussed the results of CBC with the patient.  CBC shows resolution of anemia however there are elliptocytes and polychromasia present.  She endorses a couple days of severe intermittent abdominal pain and shortness of breath that are similar to her presenting symptoms at recent admission.  She is not having any nausea or vomiting.  Last bowel movement was today and was formed and brown.  With new abnormal morphology with the red blood cells we will have her come back in today for a stat CBC, CMP, and coagulation panel.  Return precautions discussed.

## 2023-08-10 ENCOUNTER — Ambulatory Visit: Payer: Self-pay

## 2023-08-10 NOTE — Telephone Encounter (Signed)
 FYI Only or Action Required?: FYI only for provider.  Patient was last seen in primary care on 07/02/2023 by Jolaine Pac, DO.  Called Nurse Triage reporting Hand Pain.  Symptoms began several months ago.  Interventions attempted: OTC medications: ibuprofen  and Tylenol .  Symptoms are: gradually worsening.  Triage Disposition: See Physician Within 24 Hours (overriding See HCP Within 4 Hours (Or PCP Triage))  Patient/caregiver understands and will follow disposition?: Yes                             Copied from CRM [id number]. Topic: Clinical - Red Word Triage >> Aug 10, 2023  2:23 PM Miquel SAILOR wrote: Red Word that prompted transfer to Nurse Triage: Both hands swollen and in pain 1 month but getting worse 1-2 days Reason for Disposition  [1] SEVERE pain (e.g., excruciating, unable to use hand at all) AND [2] not improved after 2 hours of pain medicine  Answer Assessment - Initial Assessment Questions 1. ONSET: When did the pain start?     Pain ongoing for 2-3 months, worsening  2. LOCATION: Where is the pain located?     Bilateral hands 3. PAIN: How bad is the pain? (Scale 1-10; or mild, moderate, severe)     Rates pain a 10, taking Tylenol  and ibuprofen   4. WORK OR EXERCISE: Has there been any recent work or exercise that involved this part (i.e., hand or wrist) of the body?     Denies 5. CAUSE: What do you think is causing the pain?     Unsure  6. AGGRAVATING FACTORS: What makes the pain worse? (e.g., using computer)     Using hands in general  7. OTHER SYMPTOMS: Do you have any other symptoms? (e.g., fever, neck pain, numbness or tingling, rash, swelling)     Swelling daily, states she is having trouble holding things and squeezing hands to make a fist, occasional redness, denies fever, denies chest pain, denies difficulty breathing    No availability in office today. Scheduled first available appointment in office  tomorrow.  Protocols used: Hand Pain-A-AH

## 2023-08-11 ENCOUNTER — Other Ambulatory Visit: Payer: Self-pay

## 2023-08-11 ENCOUNTER — Encounter: Payer: Self-pay | Admitting: Internal Medicine

## 2023-08-11 ENCOUNTER — Ambulatory Visit: Payer: MEDICAID

## 2023-08-11 VITALS — BP 119/70 | HR 93 | Temp 98.2°F | Ht 67.0 in | Wt 145.0 lb

## 2023-08-11 DIAGNOSIS — F310 Bipolar disorder, current episode hypomanic: Secondary | ICD-10-CM

## 2023-08-11 DIAGNOSIS — F319 Bipolar disorder, unspecified: Secondary | ICD-10-CM | POA: Diagnosis not present

## 2023-08-11 DIAGNOSIS — M79641 Pain in right hand: Secondary | ICD-10-CM

## 2023-08-11 DIAGNOSIS — M79642 Pain in left hand: Secondary | ICD-10-CM

## 2023-08-11 DIAGNOSIS — M65941 Unspecified synovitis and tenosynovitis, right hand: Secondary | ICD-10-CM

## 2023-08-11 DIAGNOSIS — M65942 Unspecified synovitis and tenosynovitis, left hand: Secondary | ICD-10-CM

## 2023-08-11 DIAGNOSIS — D500 Iron deficiency anemia secondary to blood loss (chronic): Secondary | ICD-10-CM

## 2023-08-11 MED ORDER — QUETIAPINE FUMARATE 100 MG PO TABS: 100.0000 mg | ORAL_TABLET | Freq: Every day | ORAL | 1 refills | Status: DC

## 2023-08-11 NOTE — Assessment & Plan Note (Signed)
 Patient presented today with 4 months of bilateral hand pain with intermittent swelling most pronounced at the MCPs and PIPs.  Physical exam showed synovitis of 2nd and 3rd PIPs of right hand and third PIP of the left hand.  Tenderness throughout all PIPs and MCPs.  Given distribution of her tenderness and swelling along with her reported history of 1 to 2 hours of morning stiffness in her hands, we will work her up for potential rheumatoid arthritis.  Plan: -bilateral hand x-rays to be done at [facility] -anti-CCP -rheumatoid factor -follow-up in one week to discuss results and potential treatments -Tylenol  extra strength 1500mg  BID as the patient had been taking at home -Avoid NSAIDs

## 2023-08-11 NOTE — Progress Notes (Signed)
 CC: Acute Concern of bilateral hand swelling  HPI:  Tina [name] is a 43 y.o. female with pertinent PMH of polysubstance use disorder, PTSD, bipolar disorder, anxiety/depression and prior abnormal uterine bleeding S/P uterine ablation who presents for swelling of the bilateral hands.  She has had pain in her bilateral hands for the past 4 months which has been gradually worsening over time. She rates her pain as a 6-7/10. She has tried Gabapentin , Ibuprofen  and Tylenol  without significant benefit. She denies any trauma or increased activity of the hands. She is unsure of what is causing her pain. She states that using her hands makes her symptoms worse. She also is having swelling, trouble holding things, and squeezing her hands to make a fist. She has stiffness in her hands that last for 1-2 hours in the morning. She denies fever chest pain or difficulty breathing. Some numbness and tingling in her distal fingertips.  Regarding her anemia, she has been asymptomatic.  She denies fatigue, lightheadedness, palpitations, and dizziness. She has not had any more IV iron  but has been taking her iron  pill  She has not slept at all for the last 2 nights now because she feels that she has increased energy. States that she has been agitated with her 45 year old. She has not felt that her mood has been elevated, only irritable. Symptoms have been worsening since starting the Prozac . Diagnosed with bipolar in 2018 per her report with chart history of bipolar disorder 10 years ago. Stopped medications in the past because she did not feel that it was helping her. No grandiosity, flight of ideas or pressured speech. No SI/HI. No hallucinations.  Has used marajuana with benefit to her pain and mood. She has stopped both methamphetamines and alcohol within the last year.  She has no history of autoimmune conditions.  She had bariatric surgery in the past which precludes her use of NSAIDs.    Review  of Systems  Constitutional:  Negative for chills, fever, malaise/fatigue and weight loss.  Cardiovascular:  Negative for chest pain.  Neurological:  Negative for dizziness and headaches.    Medications: Current Outpatient Medications  Medication Instructions   ferrous sulfate  325 mg, Oral, Every other day   FLUoxetine  (PROZAC ) 20 mg, Oral, Daily   gabapentin  (NEURONTIN ) 600 mg, Oral, 2 times daily   hydrOXYzine  (ATARAX ) 10 mg, Oral, 3 times daily PRN   pantoprazole  (PROTONIX ) 40 mg, Oral, Daily   QUEtiapine  (SEROQUEL ) 100 mg, Oral, Daily at bedtime    Physical Exam:  Vitals:   08/11/23 0848  BP: 119/70  Pulse: 93  Temp: 98.2 F (36.8 C)  TempSrc: Oral  SpO2: 100%  Weight: 145 lb (65.8 kg)  Height: 5' 7 (1.702 m)    Physical Exam Constitutional:      General: She is not in acute distress.    Appearance: She is not ill-appearing.  Eyes:     General: No scleral icterus. Cardiovascular:     Rate and Rhythm: Normal rate and regular rhythm.  Pulmonary:     Effort: No respiratory distress.  Musculoskeletal:        General: Swelling and tenderness present.     Comments: Tenderness in all PIP MCP points of both hands.  Swelling of the third PIP of the left hand and the 2nd and 3rd PIPs of the right hand.  No tenderness in elbows shoulders knees hips or ankles or feet joints.  Swelling of the left ankle joint.  No  erythema or warmth.  Normal sensation and strength.  Neurological:     Mental Status: She is alert.  Psychiatric:        Mood and Affect: Mood normal.        Behavior: Behavior normal.        Thought Content: Thought content normal.       Assessment & Plan:   Bilateral hand pain Patient presented today with 4 months of bilateral hand pain with intermittent swelling most pronounced at the MCPs and PIPs.  Physical exam showed synovitis of 2nd and 3rd PIPs of right hand and third PIP of the left hand.  Tenderness throughout all PIPs and MCPs.  Given  distribution of her tenderness and swelling along with her reported history of 1 to 2 hours of morning stiffness in her hands, we will work her up for potential rheumatoid arthritis.  Plan: -bilateral hand x-rays to be done at [facility] -anti-CCP -rheumatoid factor -follow-up in one week to discuss results and potential treatments -Tylenol  extra strength 1500mg  BID as the patient had been taking at home -Avoid NSAIDs  Bipolar disorder (HCC) History of bipolar disorder.  Recently started on Prozac .  Has noticed decreased need for sleep, irritability, and distractibility since starting the Prozac .  It is possible that she may have transitioned to hypomania at this time.  Has taken multiple medications in the past but unsure of their names. She does not require admission at this time as she has no SI/HI or hallucinations, and her symptoms are not currently affecting her ability to function throughout the day.  Plan: -Discontinue Prozac  - Started on quetiapine  100 mg at bedtime for sleep and mood stabilization. - Has an appointment with psychiatry in late September.  Iron  deficiency anemia due to chronic blood loss Currently asymptomatic.  Has received IV iron  but not recently.  Has been taking oral iron  supplementation.  Plan: - Continue taking oral iron  - CBC and iron  panel today    Patient seen with Dr. Dayton Rosan Melvenia Napoleon, MD [facility] Internal Medicine Resident PGY-1 Clinic Phone: [phone] Please contact the on call pager at [phone] for any urgent or emergent needs.

## 2023-08-11 NOTE — Assessment & Plan Note (Addendum)
 History of bipolar disorder.  Recently started on Prozac .  Has noticed decreased need for sleep, irritability, and distractibility since starting the Prozac .  It is possible that she may have transitioned to hypomania at this time.  Has taken multiple medications in the past but unsure of their names. She does not require admission at this time as she has no SI/HI or hallucinations, and her symptoms are not currently affecting her ability to function throughout the day.  Plan: -Discontinue Prozac  - Started on quetiapine  100 mg at bedtime for sleep and mood stabilization. - Has an appointment with psychiatry in late September.

## 2023-08-11 NOTE — Assessment & Plan Note (Signed)
 Currently asymptomatic.  Has received IV iron  but not recently.  Has been taking oral iron  supplementation.  Plan: - Continue taking oral iron  - CBC and iron  panel today

## 2023-08-11 NOTE — Patient Instructions (Signed)
 Thank you for allowing us  to be part of your care. Today you were seen for swelling and pain of both hands.  We ordered several blood tests and x-rays of your hands to evaluate what might be going on with your hand pain.  Please continue to take Tylenol  1500 mg twice a day as you have been taking.  Please avoid taking ibuprofen .  We also talked about how you had been having some trouble sleeping.  We stopped your Prozac  and we started quetiapine  100 mg at bedtime.  If you have trouble sleeping for more than 3 to 4 days straight please go to the ED. please keep your previously scheduled follow-up with your psychiatrist.  We also checked your iron  and blood counts at today's visit I will call you with those results when I have them.  Please follow-up in 1 week so that we can discuss the results of these tests and work on treatment options.

## 2023-08-12 ENCOUNTER — Ambulatory Visit: Payer: Self-pay

## 2023-08-12 LAB — CBC WITH DIFFERENTIAL/PLATELET

## 2023-08-12 NOTE — Progress Notes (Signed)
 Patient with low iron  and ferritin, improved from 2 months ago and 7 months ago. Low Iron  saturation. Would recommend the patient schedule her previously cancelled iron  transfusions. Attempted to call the patient x3 today with no response. Will try again this afternoon.

## 2023-08-12 NOTE — Progress Notes (Signed)
Internal Medicine Clinic Attending  I was physically present during the key portions of the resident provided service and participated in the medical decision making of patient's management care. I reviewed pertinent patient test results.  The assessment, diagnosis, and plan were formulated together and I agree with the documentation in the resident's note.  Gust Rung, DO

## 2023-08-13 ENCOUNTER — Ambulatory Visit (HOSPITAL_BASED_OUTPATIENT_CLINIC_OR_DEPARTMENT_OTHER)
Admission: RE | Admit: 2023-08-13 | Discharge: 2023-08-13 | Disposition: A | Discharge status: Home / Self Care | Payer: MEDICAID | Source: Ambulatory Visit | Attending: Internal Medicine | Admitting: Internal Medicine

## 2023-08-13 DIAGNOSIS — M65942 Unspecified synovitis and tenosynovitis, left hand: Secondary | ICD-10-CM | POA: Insufficient documentation

## 2023-08-13 DIAGNOSIS — M65941 Unspecified synovitis and tenosynovitis, right hand: Secondary | ICD-10-CM | POA: Insufficient documentation

## 2023-08-13 LAB — CBC WITH DIFFERENTIAL/PLATELET
Basos: 1
EOS (ABSOLUTE): 0 x10E3/uL (ref 0.0–0.2)
Eos: 0
Hematocrit: 39.8 (ref 34.0–46.6)
Hemoglobin: 12.1 g/dL (ref 11.1–15.9)
Immature Granulocytes: 0
Immature Granulocytes: 0 x10E3/uL (ref 0.0–0.1)
Lymphs: 20
MCH: 23.7 pg — AB (ref 26.6–33.0)
MCHC: 30.4 g/dL — AB (ref 31.5–35.7)
MCV: 78 fL — AB (ref 79–97)
Monocytes Absolute: 0 x10E3/uL (ref 0.0–0.4)
Monocytes Absolute: 0.5 x10E3/uL (ref 0.1–0.9)
Monocytes: 7
Neutrophils Absolute: 1.6 x10E3/uL (ref 0.7–3.1)
Neutrophils Absolute: 5.6 x10E3/uL (ref 1.4–7.0)
Neutrophils: 72
Platelets: 304 x10E3/uL (ref 150–450)
RBC: 5.11 x10E6/uL (ref 3.77–5.28)
RDW: 22.6 — AB (ref 11.7–15.4)
WBC: 7.8 x10E3/uL (ref 3.4–10.8)

## 2023-08-13 LAB — IRON,TIBC AND FERRITIN PANEL
Ferritin: 13 ng/mL — ABNORMAL LOW (ref 15–150)
Iron Saturation: 6 % — CL (ref 15–55)
Iron: 25 ug/dL — ABNORMAL LOW (ref 27–159)
Total Iron Binding Capacity: 430 ug/dL (ref 250–450)
UIBC: 405 ug/dL (ref 131–425)

## 2023-08-13 LAB — CYCLIC CITRUL PEPTIDE ANTIBODY, IGG/IGA: Cyclic Citrullin Peptide Ab: <1 U (ref 0–19)

## 2023-08-13 LAB — RHEUMATOID FACTOR: Rheumatoid fact SerPl-aCnc: <10 [IU]/mL (ref <14.0)

## 2023-08-13 NOTE — Telephone Encounter (Signed)
 Copied from CRM #[id number]. Topic: General - Other >> Aug 13, 2023 10:05 AM Zane F wrote: Reason for CRM:   Caller: Melissa (Patient Counsellor)  Calling From : [facility]  Looking to confirm a fax sent over on 07/07/2023 to Dr. Jolaine Its a standard written order form for the patient's back brace and a medicaid form. [facility] confirmed they have not received the fax and is requesting they resend the fax.  Callback Number: [phone] Fax Number: [phone]

## 2023-08-15 ENCOUNTER — Emergency Department (HOSPITAL_BASED_OUTPATIENT_CLINIC_OR_DEPARTMENT_OTHER)
Admission: EM | Admit: 2023-08-15 | Discharge: 2023-08-15 | Disposition: A | Discharge status: Home / Self Care | Payer: MEDICAID | Attending: Emergency Medicine | Admitting: Emergency Medicine

## 2023-08-15 ENCOUNTER — Encounter (HOSPITAL_BASED_OUTPATIENT_CLINIC_OR_DEPARTMENT_OTHER): Payer: Self-pay | Admitting: *Deleted

## 2023-08-15 ENCOUNTER — Other Ambulatory Visit: Payer: Self-pay

## 2023-08-15 DIAGNOSIS — R1013 Epigastric pain: Secondary | ICD-10-CM | POA: Insufficient documentation

## 2023-08-15 DIAGNOSIS — Z79899 Other long term (current) drug therapy: Secondary | ICD-10-CM | POA: Insufficient documentation

## 2023-08-15 DIAGNOSIS — R110 Nausea: Secondary | ICD-10-CM | POA: Diagnosis not present

## 2023-08-15 LAB — CBC
HCT: 40.5 % (ref 36.0–46.0)
Hemoglobin: 12.4 g/dL (ref 12.0–15.0)
MCH: 23.4 pg — ABNORMAL LOW (ref 26.0–34.0)
MCHC: 30.6 g/dL (ref 30.0–36.0)
MCV: 76.6 fL — ABNORMAL LOW (ref 80.0–100.0)
Platelets: 261 K/uL (ref 150–400)
RBC: 5.29 MIL/uL — ABNORMAL HIGH (ref 3.87–5.11)
RDW: 22.8 % — ABNORMAL HIGH (ref 11.5–15.5)
WBC: 6.7 K/uL (ref 4.0–10.5)
nRBC: 0 % (ref 0.0–0.2)

## 2023-08-15 LAB — COMPREHENSIVE METABOLIC PANEL WITH GFR
ALT: 17 U/L (ref 0–44)
AST: 26 U/L (ref 15–41)
Albumin: 4 g/dL (ref 3.5–5.0)
Alkaline Phosphatase: 84 U/L (ref 38–126)
Anion gap: 12 (ref 5–15)
BUN: 7 mg/dL (ref 6–20)
CO2: 25 mmol/L (ref 22–32)
Calcium: 8.8 mg/dL — ABNORMAL LOW (ref 8.9–10.3)
Chloride: 103 mmol/L (ref 98–111)
Creatinine, Ser: 0.6 mg/dL (ref 0.44–1.00)
GFR, Estimated: >60 mL/min (ref >60)
Glucose, Bld: 83 mg/dL (ref 70–99)
Potassium: 3.5 mmol/L (ref 3.5–5.1)
Sodium: 140 mmol/L (ref 135–145)
Total Bilirubin: 0.3 mg/dL (ref 0.0–1.2)
Total Protein: 7 g/dL (ref 6.5–8.1)

## 2023-08-15 LAB — LIPASE, BLOOD: Lipase: 19 U/L (ref 11–51)

## 2023-08-15 MED ORDER — SUCRALFATE 1 GM/10ML PO SUSP: 1.0000 g | Freq: Three times a day (TID) | ORAL | 0 refills | Status: DC

## 2023-08-15 MED ORDER — SUCRALFATE 1 G PO TABS
1.0000 g | ORAL_TABLET | Freq: Once | ORAL | Status: AC
  Administered 2023-08-15: 1 g via ORAL
  Filled 2023-08-15: qty 1

## 2023-08-15 MED ORDER — FAMOTIDINE 20 MG PO TABS: 20.0000 mg | ORAL_TABLET | Freq: Two times a day (BID) | ORAL | 0 refills | Status: DC

## 2023-08-15 MED ORDER — FAMOTIDINE 20 MG PO TABS
20.0000 mg | ORAL_TABLET | Freq: Once | ORAL | Status: AC
  Administered 2023-08-15: 20 mg via ORAL
  Filled 2023-08-15: qty 1

## 2023-08-15 NOTE — ED Triage Notes (Signed)
 Pt to ED POV with abd pain x 3 days. Nausea without vomiting. Hx of stomach ulcers.   Patient also reporting black stools over the past couple days as well.

## 2023-08-15 NOTE — Discharge Instructions (Addendum)
 1.  Continue taking your Protonix  daily.  Take Pepcid  twice a day as prescribed and Carafate  4 times a day as prescribed for the next week. 2.  Review information about gastritis. 3.  At this time your blood counts are stable.  There is no sign that you have a bleeding ulcer.  Return immediately if you have vomiting of blood, significant lightheadedness or other concerning changes. 4.  Follow-up with your family doctor and get scheduled with a gastroenterologist for recheck as soon as possible.

## 2023-08-15 NOTE — ED Provider Notes (Signed)
 [place] EMERGENCY DEPARTMENT AT [facility] Provider Note   CSN: [id number] Arrival date & time: 08/15/23  1824     Patient presents with: Abdominal Pain   Tina [name] is a 43 y.o. female.   HPI Patient reports has had a lot of increasing upper abdominal pain for about the past 3 days.  She reports she is getting a lot more sharp pains.  Patient reports she has had nausea but no vomiting.  Patient but she has a history of stomach ulcers and this is similar to the pain she has had with ulcers.  Patient reports her stools actually look more dark as well.  Patient denies any use of ibuprofen  or NSAIDs.  Patient reports she is taking Protonix  regularly.    Prior to Admission medications   Medication Sig Start Date End Date Taking? Authorizing Provider  famotidine  (PEPCID ) 20 MG tablet Take 1 tablet (20 mg total) by mouth 2 (two) times daily. 08/15/23  Yes Armenta Canning, MD  sucralfate  (CARAFATE ) 1 GM/10ML suspension Take 10 mLs (1 g total) by mouth 4 (four) times daily -  with meals and at bedtime. 08/15/23  Yes Armenta Canning, MD  ferrous sulfate  325 (65 FE) MG tablet Take 1 tablet (325 mg total) by mouth every other day. 07/02/23   Jolaine Pac, DO  FLUoxetine  (PROZAC ) 20 MG capsule Take 1 capsule (20 mg total) by mouth daily. 07/02/23 07/01/24  Jolaine Pac, DO  gabapentin  (NEURONTIN ) 300 MG capsule Take 2 capsules (600 mg total) by mouth 2 (two) times daily. 07/02/23   Jolaine Pac, DO  hydrOXYzine  (ATARAX ) 10 MG tablet Take 1 tablet (10 mg total) by mouth 3 (three) times daily as needed. 07/02/23   Jolaine Pac, DO  pantoprazole  (PROTONIX ) 40 MG tablet Take 1 tablet (40 mg total) by mouth daily. 07/02/23   Jolaine Pac, DO  QUEtiapine  (SEROQUEL ) 100 MG tablet Take 1 tablet (100 mg total) by mouth at bedtime. 08/11/23   Smucker, Melvenia, MD    Allergies: Nsaids, Tramadol, Oxycodone , Oxycodone -acetaminophen , and Wellbutrin  [bupropion ]    Review of  Systems  Updated Vital Signs BP 110/73   Pulse 64   Temp 97.9 F (36.6 C)   Resp 15   SpO2 100%   Physical Exam Constitutional:      Comments: Patient is alert nontoxic.  No respiratory distress.  HENT:     Mouth/Throat:     Pharynx: Oropharynx is clear.  Eyes:     Extraocular Movements: Extraocular movements intact.  Cardiovascular:     Rate and Rhythm: Normal rate and regular rhythm.  Pulmonary:     Effort: Pulmonary effort is normal.     Breath sounds: Normal breath sounds.  Abdominal:     Comments: Abdomen soft without guarding.  Moderate epigastric pain to palpation.  Lower abdomen nontender.  Musculoskeletal:        General: No swelling or tenderness. Normal range of motion.     Right lower leg: No edema.     Left lower leg: No edema.  Skin:    General: Skin is warm and dry.  Neurological:     General: No focal deficit present.     Mental Status: She is oriented to person, place, and time.     Coordination: Coordination normal.     (all labs ordered are listed, but only abnormal results are displayed) Labs Reviewed  COMPREHENSIVE METABOLIC PANEL WITH GFR - Abnormal; Notable for the following components:      Result  Value   Calcium 8.8 (*)    All other components within normal limits  CBC - Abnormal; Notable for the following components:   RBC 5.29 (*)    MCV 76.6 (*)    MCH 23.4 (*)    RDW 22.8 (*)    All other components within normal limits  LIPASE, BLOOD    EKG: None  Radiology: No results found.   Procedures   Medications Ordered in the ED  famotidine  (PEPCID ) tablet 20 mg (20 mg Oral Given 08/15/23 2032)  sucralfate  (CARAFATE ) tablet 1 g (1 g Oral Given 08/15/23 2032)                                    Medical Decision Making Amount and/or Complexity of Data Reviewed Labs: ordered.   Patient presents to the outlined with epigastric pain.  Patient reports history of bleeding gastric ulcers.  Currently vital signs are stable.  No  hypotension or tachycardia.  Will proceed with diagnostic lab work.  White count 6.7 hemoglobin 12.4 platelets 261 lipase 19.  Metabolic panel normal with normal LFTs and GFR greater than 60.  Patient has had problems with chronic and recurrent abdominal pain.  She had CT abdomen pelvis done May 2025 December 2024 and in 2024 had GI bleed.  At this time, hemoglobin is at 12.4, this is stable and higher than baseline.  Clinically no signs of active GI bleed.   Review of EMR patient was seen by PCP internal medicine clinic 7\29.  Is a have made recommendations and arrangements for continued supplemental iron  and iron  transfusion.  Patient has in place outpatient follow-up and management.  At this time I will add Pepcid  and Carafate  with patient's report of pain consistent with her previous ulcers.  At this time without any bleeding or anemia, I do not feel that she needs admission but will augment treatment for potential gastritis versus ulcer and recommend follow-up with GI as soon as possible.     Final diagnoses:  Epigastric pain    ED Discharge Orders          Ordered    famotidine  (PEPCID ) 20 MG tablet  2 times daily        08/15/23 2026    sucralfate  (CARAFATE ) 1 GM/10ML suspension  3 times daily with meals & bedtime        08/15/23 2026               Armenta Canning, MD 08/15/23 2117

## 2023-08-21 NOTE — Patient Instructions (Signed)
 Patient Education Table of Contents Feeling Very Unhappy, Dissatisfied, or Uneasy (Dysphoria): What to Do  To view videos and all your education online visit, https://pe.elsevier.com/APO2Y310 or scan this QR code with your smartphone. Access to this content will expire in one year. Feeling Very Unhappy, Dissatisfied, or Uneasy (Dysphoria): What to Do Dysphoria is when someone has very strong feelings of uneasiness, unhappiness, or dissatisfaction. It can also involve feeling sad, worried, or easily annoyed.  A person may have symptoms of dysphoria because of many reasons. For example, it may be caused by stressful events or situations, even when the event or situation is expected or known.  If symptoms of dysphoria last longer than two weeks, it's important to talk to a health care professional because the symptoms may be caused by a mental health condition, such as an adjustment disorder, depression, or anxiety disorder. Follow these instructions at home:  Tips and recommendations The following tips may help with managing feelings of dysphoria: Take care of yourself. Eat a healthy diet. Exercise regularly. For example, aim for about 20 minutes of physical activity every day. Make time to do things that you enjoy. Get enough sleep. Doing certain things can help with getting enough sleep. You can: Turn off screens an hour or so before you get ready for sleep. Have a comfortable temperature in the room for sleeping. Do your best to have a regular time for going to bed and waking up. Avoid using substances to cope, including alcohol, nicotine , or other drugs. Talk about your feelings with people you trust, like loved ones, family, or friends. Learn about things that can help with coping and reducing stress, such as yoga and meditation. Try to do them often. General instructions Take your medicines only as told. Check with your provider before taking any herbs or supplements. Where to find  support Your provider. Loved ones, family, or trusted friends. Contact the [employer] HelpLine. Available Monday- Friday, 10 a.m. - 10 p.m. ET. Call 1-800-950-NAMI (782)874-9320). Text HelpLine to 605 038 5477. Email: helpline@nami .org. Where to find more information For information about living with mental health conditions visit: www.nami.org Contact a health care provider if: Your symptoms are not getting better or have lasted longer than 2 weeks. Your symptoms are getting worse. You start to have symptoms you haven't had before, and the symptoms worry you. Get help right away if: You feel like you may hurt yourself or others. You have thoughts about taking your own life. You have other thoughts or feelings that worry you. These symptoms may be an emergency. Take one of these steps right away:  Go to your nearest emergency room. Call 911. Contact The 988 Suicide & Crisis Lifeline (24 hours a day, 7 days a week, free and confidential): Call or text 988. Chat online at chat.NewsActor.se. For Veterans and their loved ones, contact the [employer]:  Call 988 and press 1.  Text 419-194-5218.  Chat online at veteranscrisisline.net This information is not intended to replace advice given to you by your health care provider. Make sure you discuss any questions you have with your health care provider. Document Released: 2005-06-10 Document Updated: 2023-01-22 Document Reviewed: 2023-01-22 Elsevier Patient Education  2025 [employer].

## 2023-08-21 NOTE — ED Triage Notes (Signed)
 Pt brought in by PD. Pt is a missing person out of [place]. Per PD, pt admitted to suicidal ideation. Missing person report states a history of depression and SI. Pt refusing to answer questions at this time.

## 2023-08-21 NOTE — Progress Notes (Signed)
 CM spoke with patient in room18.  Patient states she lives on the streets.  She denies any plan to harm herself.  She stated people say that all the time.  She stated she is a coward and would never do that.  CM offered a ride for patient to where she is going. Patient she has no where to go except the streets.  Offered a taxi to a shelter.Patient refused.  She attempted to contact friends but unsuccessful.  Patient adamant she discharge to the streets.  CM provided some snacks and drink for patient to take with her on discharge.

## 2023-08-21 NOTE — ED Provider Notes (Signed)
 [facility], DO  [facility] [address] [place] [address]  Tina [name]   [birth year] (42 y.o.)    Chief Complaint: Mental Health Problem  History of Present Illness: Patient is a 43 year old female with a history of mental health disorder presents via police for a mental evaluation.  Patient is reportedly a missing person from [place]  found on a routine traffic stop.  Patient's car was reportedly impounded by police and patient expressed desire to kill herself at that time to police officers.  Patient currently adamant that she does not wish to kill herself or have any intentions to do so.  She states that people say they want to kill themselves all the time every day and do not and that she would never harm herself due to being too much of a coward to do that.  She denies having any plans or intention to harm herself or others.  She denies any additional symptoms or complaints.   History provided by:  Patient and EMS personnel                  Medical and Surgical History: Zyasia has a past medical history of Depression. Ahri has no past surgical history on file.    ED Triage Vital Signs: Temp: 37.4 C (99.4 F), Heart Rate: 85, Resp: 18, BP: 128/85, SpO2: 98 %, Temp Source: Oral, Heart Rate Source: Monitor, Patient Position: Sitting, BP Location: Right arm  Physical Exam Vitals and nursing note reviewed.  Constitutional:      General: She is not in acute distress.    Appearance: She is well-developed.  HENT:     Head: Normocephalic and atraumatic.     Nose: Nose normal.     Mouth/Throat:     Mouth: Mucous membranes are moist.     Pharynx: Oropharynx is clear.  Eyes:     General: No scleral icterus.    Extraocular Movements: Extraocular movements intact.     Conjunctiva/sclera: Conjunctivae normal.  Cardiovascular:     Rate and Rhythm: Normal rate and regular rhythm.     Heart sounds: No  murmur heard. Pulmonary:     Effort: Pulmonary effort is normal. No respiratory distress.     Breath sounds: No stridor.  Musculoskeletal:        General: Normal range of motion.     Cervical back: Normal range of motion and neck supple.  Skin:    General: Skin is warm and dry.  Neurological:     General: No focal deficit present.     Mental Status: She is alert and oriented to person, place, and time.     Cranial Nerves: No cranial nerve deficit.     Coordination: Coordination normal.     Gait: Gait normal.  Psychiatric:        Attention and Perception: Attention normal.        Mood and Affect: Mood normal. Affect is angry.        Speech: Speech normal.        Behavior: Behavior is agitated. Behavior is cooperative.        Thought Content: Thought content normal.     Procedures Diagnoses as of 08/21/23 1409  Situational crisis   Medical Decision Making Patient presents for mental health evaluation after expressing desire to kill herself to police officers.  Patient seen and evaluated, hemodynamically stable, no acute distress.  Physical exam as above and  patient alert and oriented x 4, GCS 15, and does not appear to be intoxicated or under the influence.  Patient very adamant that she does not wish to harm herself and has no plans or intentions to do so.  She expresses desire to be discharged at this time so she can find a place to stay before dark.  She reports that she is trying to find a job and knows that this is what she needs in order to help herself.  She believes that her family is trying to control her by filing the missing persons report.  Patient denies any complaints at this time.  She again adamantly denies any plan or intent to harm herself or others.  Patient given resources in the area and instructed to return to the emergency department if her symptoms worsen or persist.  Patient agreeable at this time, discharged hemodynamically stable in no acute  distress.   Disposition: DISCHARGE  ED Prescriptions   None           Sarna, Tayla, DO 08/21/23 1415

## 2023-08-28 ENCOUNTER — Ambulatory Visit (HOSPITAL_COMMUNITY)
Admission: EM | Admit: 2023-08-28 | Discharge: 2023-08-28 | Disposition: A | Discharge status: Other Acute Inpatient Hospital | Payer: MEDICAID | Attending: Psychiatry | Admitting: Psychiatry

## 2023-08-28 ENCOUNTER — Emergency Department (HOSPITAL_COMMUNITY)
Admission: EM | Admit: 2023-08-28 | Discharge: 2023-09-01 | Disposition: A | Discharge status: Other Acute Inpatient Hospital | Payer: MEDICAID | Attending: Emergency Medicine | Admitting: Emergency Medicine

## 2023-08-28 ENCOUNTER — Emergency Department (HOSPITAL_COMMUNITY): Payer: MEDICAID

## 2023-08-28 DIAGNOSIS — F332 Major depressive disorder, recurrent severe without psychotic features: Secondary | ICD-10-CM | POA: Insufficient documentation

## 2023-08-28 DIAGNOSIS — M25531 Pain in right wrist: Secondary | ICD-10-CM | POA: Diagnosis not present

## 2023-08-28 DIAGNOSIS — Z79899 Other long term (current) drug therapy: Secondary | ICD-10-CM | POA: Diagnosis not present

## 2023-08-28 DIAGNOSIS — R4689 Other symptoms and signs involving appearance and behavior: Secondary | ICD-10-CM

## 2023-08-28 DIAGNOSIS — R451 Restlessness and agitation: Secondary | ICD-10-CM | POA: Insufficient documentation

## 2023-08-28 DIAGNOSIS — R456 Violent behavior: Secondary | ICD-10-CM | POA: Insufficient documentation

## 2023-08-28 DIAGNOSIS — F12188 Cannabis abuse with other cannabis-induced disorder: Secondary | ICD-10-CM | POA: Insufficient documentation

## 2023-08-28 DIAGNOSIS — F333 Major depressive disorder, recurrent, severe with psychotic symptoms: Secondary | ICD-10-CM | POA: Diagnosis present

## 2023-08-28 DIAGNOSIS — F411 Generalized anxiety disorder: Secondary | ICD-10-CM | POA: Diagnosis present

## 2023-08-28 DIAGNOSIS — F1994 Other psychoactive substance use, unspecified with psychoactive substance-induced mood disorder: Secondary | ICD-10-CM

## 2023-08-28 DIAGNOSIS — F1910 Other psychoactive substance abuse, uncomplicated: Secondary | ICD-10-CM | POA: Insufficient documentation

## 2023-08-28 DIAGNOSIS — F4312 Post-traumatic stress disorder, chronic: Secondary | ICD-10-CM | POA: Diagnosis not present

## 2023-08-28 DIAGNOSIS — Z560 Unemployment, unspecified: Secondary | ICD-10-CM | POA: Insufficient documentation

## 2023-08-28 DIAGNOSIS — F3341 Major depressive disorder, recurrent, in partial remission: Secondary | ICD-10-CM | POA: Diagnosis present

## 2023-08-28 DIAGNOSIS — Z5900 Homelessness unspecified: Secondary | ICD-10-CM | POA: Insufficient documentation

## 2023-08-28 DIAGNOSIS — F419 Anxiety disorder, unspecified: Secondary | ICD-10-CM | POA: Insufficient documentation

## 2023-08-28 DIAGNOSIS — F339 Major depressive disorder, recurrent, unspecified: Secondary | ICD-10-CM | POA: Diagnosis present

## 2023-08-28 LAB — POCT URINE DRUG SCREEN - MANUAL ENTRY (I-SCREEN)
POC Amphetamine UR: NOT DETECTED
POC Buprenorphine (BUP): NOT DETECTED
POC Cocaine UR: NOT DETECTED
POC Marijuana UR: POSITIVE — AB
POC Methadone UR: NOT DETECTED
POC Methamphetamine UR: NOT DETECTED
POC Morphine: NOT DETECTED
POC Oxazepam (BZO): NOT DETECTED
POC Oxycodone UR: NOT DETECTED
POC Secobarbital (BAR): NOT DETECTED

## 2023-08-28 LAB — CBC WITH DIFFERENTIAL/PLATELET
Abs Immature Granulocytes: 0.02 K/uL (ref 0.00–0.07)
Basophils Absolute: 0 K/uL (ref 0.0–0.1)
Basophils Relative: 0 %
Eosinophils Absolute: 0 K/uL (ref 0.0–0.5)
Eosinophils Relative: 0 %
HCT: 38.4 % (ref 36.0–46.0)
Hemoglobin: 11.2 g/dL — ABNORMAL LOW (ref 12.0–15.0)
Immature Granulocytes: 0 %
Lymphocytes Relative: 28 %
Lymphs Abs: 2.1 K/uL (ref 0.7–4.0)
MCH: 22.8 pg — ABNORMAL LOW (ref 26.0–34.0)
MCHC: 29.2 g/dL — ABNORMAL LOW (ref 30.0–36.0)
MCV: 78 fL — ABNORMAL LOW (ref 80.0–100.0)
Monocytes Absolute: 0.4 K/uL (ref 0.1–1.0)
Monocytes Relative: 5 %
Neutro Abs: 4.8 K/uL (ref 1.7–7.7)
Neutrophils Relative %: 67 %
Platelets: 242 K/uL (ref 150–400)
RBC: 4.92 MIL/uL (ref 3.87–5.11)
RDW: 20.4 % — ABNORMAL HIGH (ref 11.5–15.5)
WBC: 7.4 K/uL (ref 4.0–10.5)
nRBC: 0 % (ref 0.0–0.2)

## 2023-08-28 LAB — COMPREHENSIVE METABOLIC PANEL WITH GFR
ALT: 25 U/L (ref 0–44)
AST: 25 U/L (ref 15–41)
Albumin: 3 g/dL — ABNORMAL LOW (ref 3.5–5.0)
Alkaline Phosphatase: 58 U/L (ref 38–126)
Anion gap: 7 (ref 5–15)
BUN: 9 mg/dL (ref 6–20)
CO2: 26 mmol/L (ref 22–32)
Calcium: 8 mg/dL — ABNORMAL LOW (ref 8.9–10.3)
Chloride: 108 mmol/L (ref 98–111)
Creatinine, Ser: 0.5 mg/dL (ref 0.44–1.00)
GFR, Estimated: >60 mL/min (ref >60)
Glucose, Bld: 84 mg/dL (ref 70–99)
Potassium: 3.7 mmol/L (ref 3.5–5.1)
Sodium: 141 mmol/L (ref 135–145)
Total Bilirubin: 0.6 mg/dL (ref 0.0–1.2)
Total Protein: 5.8 g/dL — ABNORMAL LOW (ref 6.5–8.1)

## 2023-08-28 LAB — URINALYSIS, ROUTINE W REFLEX MICROSCOPIC
Bilirubin Urine: NEGATIVE
Glucose, UA: NEGATIVE mg/dL
Hgb urine dipstick: NEGATIVE
Ketones, ur: NEGATIVE mg/dL
Nitrite: NEGATIVE
Protein, ur: NEGATIVE mg/dL
Specific Gravity, Urine: 1.013 (ref 1.005–1.030)
pH: 8 (ref 5.0–8.0)

## 2023-08-28 LAB — RAPID URINE DRUG SCREEN, HOSP PERFORMED
Amphetamines: NOT DETECTED
Barbiturates: NOT DETECTED
Benzodiazepines: NOT DETECTED
Cocaine: NOT DETECTED
Opiates: NOT DETECTED
Tetrahydrocannabinol: POSITIVE — AB

## 2023-08-28 LAB — POC URINE PREG, ED: Preg Test, Ur: NEGATIVE

## 2023-08-28 LAB — HCG, SERUM, QUALITATIVE: Preg, Serum: NEGATIVE

## 2023-08-28 LAB — ETHANOL: Alcohol, Ethyl (B): <15 mg/dL (ref <15)

## 2023-08-28 MED ORDER — ZIPRASIDONE MESYLATE 20 MG IM SOLR
20.0000 mg | Freq: Once | INTRAMUSCULAR | Status: DC
  Filled 2023-08-28: qty 20

## 2023-08-28 MED ORDER — ALUM & MAG HYDROXIDE-SIMETH 200-200-20 MG/5ML PO SUSP: 30.0000 mL | ORAL | Status: DC | PRN

## 2023-08-28 MED ORDER — LORAZEPAM 2 MG/ML IJ SOLN: 2.0000 mg | Freq: Three times a day (TID) | INTRAMUSCULAR | Status: DC | PRN

## 2023-08-28 MED ORDER — DIPHENHYDRAMINE HCL 50 MG/ML IJ SOLN: 50.0000 mg | Freq: Three times a day (TID) | INTRAMUSCULAR | Status: DC | PRN

## 2023-08-28 MED ORDER — PANTOPRAZOLE SODIUM 40 MG PO TBEC
40.0000 mg | DELAYED_RELEASE_TABLET | Freq: Every day | ORAL | Status: DC
  Administered 2023-08-30 – 2023-09-01 (×3): 40 mg via ORAL
  Filled 2023-08-28 (×3): qty 1

## 2023-08-28 MED ORDER — HALOPERIDOL LACTATE 5 MG/ML IJ SOLN: 10.0000 mg | Freq: Three times a day (TID) | INTRAMUSCULAR | Status: DC | PRN

## 2023-08-28 MED ORDER — HALOPERIDOL LACTATE 5 MG/ML IJ SOLN: 5.0000 mg | Freq: Three times a day (TID) | INTRAMUSCULAR | Status: DC | PRN

## 2023-08-28 MED ORDER — HALOPERIDOL 5 MG PO TABS: 5.0000 mg | ORAL_TABLET | Freq: Three times a day (TID) | ORAL | Status: DC | PRN

## 2023-08-28 MED ORDER — MAGNESIUM HYDROXIDE 400 MG/5ML PO SUSP: 30.0000 mL | Freq: Every day | ORAL | Status: DC | PRN

## 2023-08-28 MED ORDER — DIPHENHYDRAMINE HCL 50 MG PO CAPS: 50.0000 mg | ORAL_CAPSULE | Freq: Three times a day (TID) | ORAL | Status: DC | PRN

## 2023-08-28 MED ORDER — LORAZEPAM 1 MG PO TABS
1.0000 mg | ORAL_TABLET | Freq: Once | ORAL | Status: AC
  Administered 2023-08-28: 1 mg via ORAL
  Filled 2023-08-28: qty 1

## 2023-08-28 NOTE — Progress Notes (Signed)
   08/28/23 1552  [facility] Triage Screening (Walk-ins at [facility] only)  How Did You Hear About Us ? Legal System  What Is the Reason for Your Visit/Call Today? PT Tina [name] 43Y female presents to [facility] under IVC. PT is irritated and states that she is confused as to why she's here but she suspects it was her family. PT states she was woken up from an afternoon nap after hitting my THC vape and brought here. PT denied SI, HI, AVH. PT denied alcohol use but stated that she smokes marijuana all day everyday and I ain't gone stop.  Have You Recently Had Any Thoughts About Hurting Yourself? No  Are You Planning to Commit Suicide/Harm Yourself At This time? No  Have you Recently Had Thoughts About Hurting Someone [name]? No  Are You Planning To Harm Someone At This Time? No  Physical Abuse Denies  Verbal Abuse Yes, past (Comment);Yes, present (Comment)  Sexual Abuse Denies  Exploitation of patient/patient's resources Denies  Self-Neglect Denies  Are you currently experiencing any auditory, visual or other hallucinations? No  Have You Used Any Alcohol or Drugs in the Past 24 Hours? Yes  What Did You Use and How Much? Marijuana, around noon  Do you have any current medical co-morbidities that require immediate attention? No  Clinician description of patient physical appearance/behavior: Disheveled, intense, irritated/angry, appears under a substance influence  What Do You Feel Would Help You the Most Today? Alcohol or Drug Use Treatment;Social Support  Determination of Need Urgent (48 hours)  Options For Referral [facility] Urgent Care;Facility-Based Crisis  Determination of Need filed? Yes

## 2023-08-28 NOTE — ED Provider Notes (Signed)
 [place] EMERGENCY DEPARTMENT AT [facility] Provider Note   CSN: [id number] Arrival date & time: 08/28/23  1851     Patient presents with: Aggressive Behavior   Tina [name] is a 43 y.o. female.   Patient sent here from behavioral health urgent care with IVC in place.  They were unable to keep her there due to capacity issues.  However if space opens up they will accept her back.  Concern for polysubstance abuse.  She has history of anxiety reflux major depressive disorder alcohol use bipolar.  There recommend patient treatment.  She has some pain to her right wrist but otherwise she denies any SI HI.  She is upset about a lot of things at this time.  The history is provided by the patient.       Prior to Admission medications   Medication Sig Start Date End Date Taking? Authorizing Provider  ferrous sulfate  325 (65 FE) MG tablet Take 1 tablet (325 mg total) by mouth every other day. 07/02/23   Jolaine Pac, DO  pantoprazole  (PROTONIX ) 40 MG tablet Take 1 tablet (40 mg total) by mouth daily. 07/02/23   Jolaine Pac, DO  sucralfate  (CARAFATE ) 1 GM/10ML suspension Take 10 mLs (1 g total) by mouth 4 (four) times daily -  with meals and at bedtime. 08/15/23   Armenta Canning, MD    Allergies: Nsaids, Tramadol, Oxycodone , Oxycodone -acetaminophen , and Wellbutrin  [bupropion ]    Review of Systems  Updated Vital Signs BP 111/68 (BP Location: Right Arm)   Pulse 65   Temp 98.3 F (36.8 C) (Oral)   Resp 18   SpO2 100%   Physical Exam Vitals and nursing note reviewed.  Constitutional:      General: She is not in acute distress.    Appearance: She is well-developed. She is not ill-appearing.  HENT:     Head: Normocephalic and atraumatic.     Nose: Nose normal.     Mouth/Throat:     Mouth: Mucous membranes are moist.  Eyes:     Extraocular Movements: Extraocular movements intact.     Conjunctiva/sclera: Conjunctivae normal.     Pupils: Pupils are equal,  round, and reactive to light.  Cardiovascular:     Rate and Rhythm: Normal rate and regular rhythm.     Pulses: Normal pulses.     Heart sounds: Normal heart sounds. No murmur heard. Pulmonary:     Effort: Pulmonary effort is normal. No respiratory distress.     Breath sounds: Normal breath sounds.  Abdominal:     Palpations: Abdomen is soft.     Tenderness: There is no abdominal tenderness.  Musculoskeletal:        General: No swelling.     Cervical back: Normal range of motion and neck supple.     Comments: No obvious deformity to the right wrist but some tenderness  Skin:    General: Skin is warm and dry.     Capillary Refill: Capillary refill takes less than 2 seconds.  Neurological:     General: No focal deficit present.     Mental Status: She is alert and oriented to person, place, and time.     Cranial Nerves: No cranial nerve deficit.     Sensory: No sensory deficit.     Motor: No weakness.     Coordination: Coordination normal.  Psychiatric:        Mood and Affect: Mood normal.     (all labs ordered are listed, but  only abnormal results are displayed) Labs Reviewed  COMPREHENSIVE METABOLIC PANEL WITH GFR - Abnormal; Notable for the following components:      Result Value   Calcium 8.0 (*)    Total Protein 5.8 (*)    Albumin 3.0 (*)    All other components within normal limits  CBC WITH DIFFERENTIAL/PLATELET - Abnormal; Notable for the following components:   Hemoglobin 11.2 (*)    MCV 78.0 (*)    MCH 22.8 (*)    MCHC 29.2 (*)    RDW 20.4 (*)    All other components within normal limits  ETHANOL  HCG, SERUM, QUALITATIVE  RAPID URINE DRUG SCREEN, HOSP PERFORMED    EKG: None  Radiology: No results found.   Procedures   Medications Ordered in the ED - No data to display                                  Medical Decision Making Amount and/or Complexity of Data Reviewed Labs: ordered. Radiology: ordered.   Tina [name] is here from  behavioral health urgent care with IVC in place.  She had to be transferred here due to capacity issues at behavioral health.  She is able to go back there once they have ability to hold her there.  Polysubstance abuse history.  She has been acting erratic.  Plan is for inpatient treatment.  She is complaining of some right wrist pain but otherwise she has no complaints.  She denies SI or HI.  She is pretty agitated irritated.  Will check some basic labs.  Will get x-ray of the right wrist.  Please see behavioral health note for further psychiatric history.   per my review and interpretation are unremarkable.  X-ray shows no acute fracture or malalignment.  Overall patient is medically cleared.  Awaiting psych placement.  Can go back to behavioral health on they have space for her.  This chart was dictated using voice recognition software.  Despite best efforts to proofread,  errors can occur which can change the documentation meaning.      Final diagnoses:  Aggressive behavior  Polysubstance abuse [facility])    ED Discharge Orders     None          Tina Cornet, DO 08/28/23 2109

## 2023-08-28 NOTE — ED Notes (Signed)
 Pt called me a nigga and said the f word to be after I told pt to stop shouting

## 2023-08-28 NOTE — BH Assessment (Signed)
 Comprehensive Clinical Assessment (CCA) Note  08/28/2023 Tina [name] [id number] Disposition: Per Tina Culver NP, patient is recommended for inpatient treatment.  The patient demonstrates the following risk factors for suicide: Chronic risk factors for suicide include: substance use disorder. Acute risk factors for suicide include: family or marital conflict and unemployment. Protective factors for this patient include: coping skills. Considering these factors, the overall suicide risk at this point appears to be low. Patient is appropriate for outpatient follow up.  Patient presents under IVC.  Per IVC:: Patient has been mental health illness and is a danger to self or others and is in need of treatment in order to prevent throughout the disability deterioration that would predictably resulted in dangerousness she is also a substance use abuse at.  Based on her having an unknown mental health diagnosis also she found her mother deceased and has not recovered mentally from the event she has a plan to overdose on drugs and has 10 100 text messages to her brother stating her plan she uses ice and she has also threatened to be a random female.  Per triage note PT Tina [name] 43Y female presents to [facility] under IVC. PT is irritated and states that she is confused as to why she's here but she suspects it was her family. PT states she was woken up from an afternoon nap after hitting my THC vape and brought here. PT denied SI, HI, AVH. PT denied alcohol use but stated that she smokes marijuana all day everyday and I ain't gone stop.  Patient is a 43 year old female who presents invol[facility]arily to [facility] as petitioned by her brother Tina [name].  Patient reports having previous diagnosis of depression but is currently not receiving psychiatric services she continues to deny SI or HI and AVH.  Patient denies previous inpatient hospitalizations for mental health although she endorses hospitalization  for substance use.   Patient has 2 children and is currently unemployed and homeless.  Patient denies any recent substance use stating it is being time since she used meth amphetamine  however she reported to triage states she used her THC vague earlier today and was not planning on stopping.  Patient is sitting in the chair slumped over with her feet up and disheveled. She appears to be alert and oriented in an irritable mood with congruent affect.  Patient's speech is clear and coherent with normal volume.  Patient's eye contact is good.  Her thought process is coherent.  There is no indication that the patient is currently responding to internal stimuli or experiencing delusional thought content.  Although the patient responded to the question during the assessment she was irritable and annoyed.   Chief Complaint:  Chief Complaint  Patient presents with   IVC   Drug Problem   Visit Diagnosis:  Substance induced mood disorder (HCC)                                Severe episode of recurrent major depressive disorder, without psychotic features (HCC)   CCA Screening, Triage and Referral (STR)  Patient Reported Information How did you hear about us ? Legal System  What Is the Reason for Your Visit/Call Today? PT Tina [name] 43Y female presents to [facility] under IVC. PT is irritated and states that she is confused as to why she's here but she suspects it was her family. PT states she was woken up from an afternoon nap after hitting  my THC vape and brought here. PT denied SI, HI, AVH. PT denied alcohol use but stated that she smokes marijuana all day everyday and I ain't gone stop.  How Long Has This Been Causing You Problems? > than 6 months  What Do You Feel Would Help You the Most Today? Alcohol or Drug Use Treatment; Social Support   Have You Recently Had Any Thoughts About Hurting Yourself? No  Are You Planning to Commit Suicide/Harm Yourself At This time? No   Flowsheet Row ED  from 08/28/2023 in [facility] ED from 08/15/2023 in [facility] Department at [facility] Office Visit from 08/11/2023 in [facility]  C-SSRS RISK CATEGORY No Risk No Risk Error: Q7 should not be populated when Q6 is No    Have you Recently Had Thoughts About Hurting Someone [name]? No  Are You Planning to Harm Someone at This Time? No  Explanation: N/A   Have You Used Any Alcohol or Drugs in the Past 24 Hours? Yes  How Long Ago Did You Use Drugs or Alcohol? Today What Did You Use and How Much? Marijuana, around noon   Do You Currently Have a Therapist/Psychiatrist? No  Name of Therapist/Psychiatrist:  N/A  Have You Been Recently Discharged From Any Office Practice or Programs? No  Explanation of Discharge From Practice/Program: N/A     CCA Screening Triage Referral Assessment Type of Contact: Face-to-Face  Telemedicine Service Delivery:   Is this Initial or Reassessment?   Date Telepsych consult ordered in [facility]:    Time Telepsych consult ordered in [facility]:    Location of Assessment: [facility]  Provider Location: [facility] Assessment Services   Collateral Involvement: None   Does Patient Have a Automotive engineer Guardian? No  Legal Guardian Contact Information: N/A  Copy of Legal Guardianship Form: -- (N/A)  Legal Guardian Notified of Arrival: -- (na)  Legal Guardian Notified of Pending Discharge: -- (na)  If Minor and Not Living with Parent(s), Who has Custody? N/A  Is CPS involved or ever been involved? Never  Is APS involved or ever been involved? Never   Patient Determined To Be At Risk for Harm To Self or Others Based on Review of Patient Reported Information or Presenting Complaint? No  Method: No Plan  Availability of Means: No access or NA  Intent: Vague intent or NA  Notification Required: No need or identified  person  Additional Information for Danger to Others Potential: -- (na)  Additional Comments for Danger to Others Potential: Not a potential  danger to othes  Are There Guns or Other Weapons in Your Home? No  Types of Guns/Weapons: N/A  Are These Weapons Safely Secured?                            -- (Denies)  Who Could Verify You Are Able To Have These Secured: N/A  Do You Have any Outstanding Charges, Pending Court Dates, Parole/Probation? Pt denies  Contacted To Inform of Risk of Harm To Self or Others: -- (na)    Does Patient Present under Involuntary Commitment? Yes    [place] of Residence: [place]   Patient Currently Receiving the Following Services: Not Receiving Services   Determination of Need: Urgent (48 hours)   Options For Referral: [facility] Urgent Care; Inpatient Hospitalization; Medication Management     CCA Biopsychosocial  Patient Reported Schizophrenia/Schizoaffective Diagnosis in Past: No   Strengths: UTA   Mental Health Symptoms Depression:  None (Pt denies having any sympoms of depresssion at this time)   Duration of Depressive symptoms:    Mania:  None   Anxiety:   None   Psychosis:  None   Duration of Psychotic symptoms:    Trauma:  None   Obsessions:  None   Compulsions:  None   Inattention:  None   Hyperactivity/Impulsivity:  None   Oppositional/Defiant Behaviors:  N/A   Emotional Irregularity:  None   Other Mood/Personality Symptoms:  None    Mental Status Exam Appearance and self-care  Stature:  Average   Weight:  Average weight   Clothing:  Disheveled   Grooming:  Neglected   Cosmetic use:  None   Posture/gait:  Slumped   Motor activity:  Agitated   Sensorium  Attention:  Normal   Concentration:  Normal   Orientation:  Time; Situation; Place; Person   Recall/memory:  Normal   Affect and Mood  Affect:  Congruent   Mood:  Irritable   Relating  Eye contact:  Fleeting   Facial expression:  Angry    Attitude toward examiner:  Irritable; Dramatic   Thought and Language  Speech flow: Clear and Coherent   Thought content:  Appropriate to Mood and Circumstances   Preoccupation:  None   Hallucinations:  None   Organization:  Coherent   [employer] of Knowledge:  Fair   Intelligence:  Average   Abstraction:  Functional   Judgement:  Fair   Dance movement psychotherapist:  Realistic   Insight:  Fair   Decision Making:  Vacilates   Social Functioning  Social Maturity:  Irresponsible   Social Judgement:  Normal   Stress  Stressors:  Family conflict; Housing   Coping Ability:  Deficient supports; Overwhelmed   Skill Deficits:  Decision making; Self-care; Self-control   Supports:  Family; Support needed     Religion: Religion/Spirituality Are You A Religious Person?: No How Might This Affect Treatment?: N/A  Leisure/Recreation: Leisure / Recreation Do You Have Hobbies?: No  Exercise/Diet: Exercise/Diet Do You Exercise?: Yes What Type of Exercise Do You Do?: Run/Walk How Many Times a Week Do You Exercise?: 6-7 times a week Do You Follow a Special Diet?: No Do You Have Any Trouble Sleeping?: Yes Explanation of Sleeping Difficulties: Pt stated that she has trouble sleeping at night as she is unhoused currently.   CCA Employment/Education Employment/Work Situation: Employment / Work Situation Employment Situation: Unemployed Patient's Job has Been Impacted by Current Illness:  (N/A)  Education: Education Last Grade Completed: 8 Did You Product manager?: No Did You Have An Individualized Education Program (IIEP): No Did You Have Any Difficulty At [employer]?: No Patient's Education Has Been Impacted by Current Illness: No   CCA Family/Childhood History Family and Relationship History: Family history Marital status: Single Does patient have children?: Yes How many children?: 2 How is patient's relationship with their children?: pt states that they  probably haeme no  Childhood History:  Childhood History By whom was/is the patient raised?: Both parents Did patient suffer any verbal/emotional/physical/sexual abuse as a child?: No Did patient suffer from severe childhood neglect?: No Has patient ever been sexually abused/assaulted/raped as an adolescent or adult?: No Was the patient ever a victim of a crime or a disaster?: No Witnessed domestic violence?: No Has patient been affected by domestic violence as an adult?: No  CCA Substance Use Alcohol/Drug Use: Alcohol / Drug Use Pain Medications: See MAR Prescriptions: See MAR Over the Counter: See MAR History of alcohol / drug use?: Yes Longest period of sobriety (when/how long): 2 weeks                         ASAM's:  Six Dimensions of Multidimensional Assessment  Dimension 1:  Acute Intoxication and/or Withdrawal Potential:      Dimension 2:  Biomedical Conditions and Complications:      Dimension 3:  Emotional, Behavioral, or Cognitive Conditions and Complications:     Dimension 4:  Readiness to Change:     Dimension 5:  Relapse, Continued use, or Continued Problem Potential:     Dimension 6:  Recovery/Living Environment:     ASAM Severity Score:    ASAM Recommended Level of Treatment:     Substance use Disorder (SUD)    Recommendations for Services/Supports/Treatments:    Disposition Recommendation per psychiatric provider: We recommend inpatient psychiatric hospitalization when medically cleared. Patient is under voluntary admission status at this time; please IVC if attempts to leave hospital.   DSM5 Diagnoses: Patient Active Problem List   Diagnosis Date Noted   Bilateral hand pain 08/11/2023   Bipolar disorder (HCC)    Prediabetes 07/02/2023   Closed fracture of transverse process of lumbar vertebra (HCC) 07/02/2023   Abnormal uterine bleeding (AUB) 07/02/2023   Symptomatic anemia 05/21/2023   Alcohol use disorder, severe, in early  remission (HCC) 12/21/2022   Cannabis use disorder 12/21/2022   Opioid use disorder, moderate, dependence (HCC) 12/21/2022   Microcytic anemia 12/21/2022   Hypocalcemia 12/21/2022   MDD (major depressive disorder), severe (HCC) 12/21/2022   Social anxiety disorder 12/21/2022   PTSD (post-traumatic stress disorder) 12/21/2022   Substance dependence with physiological dependence (HCC) 12/20/2022   Methamphetamine use disorder, severe (HCC) 12/20/2022   Protein-calorie malnutrition, severe 07/29/2022   H/O Gastrojejunal ulcer with recurrent perforation 07/28/2022   Alcohol dependence (HCC) 07/28/2022   Peritonitis (HCC) 07/27/2022   Sciatica 06/03/2022   Perforated ulcer (HCC) 11/26/2021   Perforated viscus 09/01/2021   MDD (major depressive disorder), recurrent episode (HCC) 03/16/2017   Iron  deficiency anemia due to chronic blood loss 11/18/2016   Migraines 11/07/2016   S/P cesarean section 08/29/2014   Venereal disease 06/23/2012   Cervicalgia 04/21/2012   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   Postlaminectomy syndrome, lumbar region 06/26/2011   Lumbago 05/09/2011   DJD (degenerative joint disease), lumbar 05/08/2011   Seasonal and perennial allergic rhinitis 05/08/2011   Sacroiliac dysfunction 04/07/2011   Lumbosacral spondylosis without myelopathy 03/10/2011   Preventative health care 04/26/2010   SMOKER 03/29/2010   HYPERSOMNIA 02/08/2010   HYPERLIPIDEMIA 04/26/2009   Attention deficit disorder 04/26/2009   GAD (generalized anxiety disorder) 02/01/2009   DEPRESSION 02/01/2009   COMMON MIGRAINE 02/01/2009   GERD 02/01/2009   CHOLELITHIASIS 02/01/2009     Referrals to Alternative Service(s): Referred to Alternative Service(s):   Place:   Date:   Time:    Referred to Alternative Service(s):   Place:   Date:   Time:    Referred to Alternative Service(s):   Place:   Date:   Time:    Referred to Alternative Service(s):   Place:   Date:   Time:     Lianne JINNY Shuck, LCSW

## 2023-08-28 NOTE — ED Notes (Signed)
 Pt refused protonix  stating I want my doctor. I already took Protonix .

## 2023-08-28 NOTE — Discharge Instructions (Addendum)
 Transfer to [facility]

## 2023-08-28 NOTE — ED Notes (Signed)
 Patient in assessment room agitated. Patient transferred to [facility] via sheriff transport. Patient escorted to [place] via staff for transport to destination. Safety maintained.

## 2023-08-28 NOTE — Progress Notes (Signed)
 08/28/2023 - QAP / Access Health denied. Currently, this patient does not reside within the state of [place] . Randine LITTIE Piety, RMA, CHW, [facility].

## 2023-08-28 NOTE — ED Notes (Signed)
 Pt belonging bag was placed in a brown bag by sheriff and was put in the nursing station 9-25

## 2023-08-28 NOTE — ED Notes (Addendum)
 Staff attempted to get pt labs. Pt stated, I'm not staying here and you are not sending me anywhere else. Try me if you want to. Provider made aware. Pt currently hitting walls in assessment room. Meal and drink offered but pt refuses. Orders received to transport pt to [facility] via NP. Charge nurse, Ole, RN, notified of pt's acceptance to their facility.  Sheriff transport notified. Awaiting transport. Pt continue to be agitated and refuse care of staff. Safety maintained.
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
## 2023-08-29 ENCOUNTER — Other Ambulatory Visit: Payer: Self-pay

## 2023-08-29 MED ORDER — BUSPIRONE HCL 5 MG PO TABS
7.5000 mg | ORAL_TABLET | Freq: Two times a day (BID) | ORAL | Status: DC
  Administered 2023-08-29 – 2023-09-01 (×6): 7.5 mg via ORAL
  Filled 2023-08-29 (×9): qty 1.5

## 2023-08-29 MED ORDER — OLANZAPINE 5 MG PO TBDP
5.0000 mg | ORAL_TABLET | Freq: Two times a day (BID) | ORAL | Status: DC
  Administered 2023-08-29 – 2023-09-01 (×7): 5 mg via ORAL
  Filled 2023-08-29 (×7): qty 1

## 2023-08-29 MED ORDER — ACETAMINOPHEN 325 MG PO TABS
650.0000 mg | ORAL_TABLET | Freq: Once | ORAL | Status: AC
  Administered 2023-08-29: 650 mg via ORAL
  Filled 2023-08-29: qty 2

## 2023-08-29 MED ORDER — ZIPRASIDONE MESYLATE 20 MG IM SOLR: 20.0000 mg | Freq: Once | INTRAMUSCULAR | Status: DC

## 2023-08-29 MED ORDER — SERTRALINE HCL 50 MG PO TABS
25.0000 mg | ORAL_TABLET | Freq: Every day | ORAL | Status: DC
  Administered 2023-08-29 – 2023-09-01 (×4): 25 mg via ORAL
  Filled 2023-08-29 (×4): qty 1

## 2023-08-29 MED ORDER — LORAZEPAM 1 MG PO TABS
1.0000 mg | ORAL_TABLET | Freq: Once | ORAL | Status: AC
  Administered 2023-08-29: 1 mg via ORAL
  Filled 2023-08-29: qty 1

## 2023-08-29 NOTE — Progress Notes (Signed)
 Patient has been denied by [facility] due to no appropriate beds and has been faxed out. Patient meets BH inpatient criteria per [facility], PHMNP. Patient has been faxed out to the following facilities:   [facility] Pending - Request [address] [place] [place] [phone]--[facility]-L[facility] Pending - Request [facility], [place] [phone]--RRFAY-LWR [facility] Perinatal and Eating Disorders Pending - Request Sent--[address] [place] [phone]--[facility] Pending - Request [address] [place] [phone]--RRFAY-[facility]opwj [facility] Pending - Request Sent--[address] [place], [place] [phone]--[facility] Patient Placement Pending - Request [facility], [facility] [phone]--[facility] Pending - Request Sent--[address] [place] [phone]--RRFAY-Noi [facility] Pending - Request Sent--[address], [phone]--[facility] Pending - Request Sent--[address] [place] [phone]--RRFAY-F[facility] Pending - Request [address], [place] [phone]--[facility] Pending - Request Sent--[address] [place] [phone]--RRFAY-[facility]ju[facility] Pending - Request [address] [place], [place] [phone]--RRFA[facility] Pending - Request [address] [place] [phone]--RR[facility] Pending - Request [address], [place] [phone]--RRFAY-[facility] Pending - Request Sent--[address], [place] [phone]--[facility] Pending - Request Sent--[address] [place] [facility] [phone]--[facility] Pending - Request [address] [place] [phone]--   Bunnie Gallop, MSW, LCSW-A  5:27 PM 08/29/2023

## 2023-08-29 NOTE — ED Notes (Signed)
 Pt is awake and eating her food. Often pt to take a shower and get her to change  Into paper purple scrubs. Pt refused to change, the nurse was advised and witness.

## 2023-08-29 NOTE — ED Notes (Signed)
 Per pt sitter, pt became increasing agitated after phone call with her brother. Pt told brother she would kill herself while she was here. After the phone call, pt started yelling and verbally threatening staff. Pt stated my nerves are shot! Provider made aware and ordered ativan  as noted. Ativan  given. Pt now lying on bed.

## 2023-08-29 NOTE — Progress Notes (Signed)
 CSW informed ED staff that [facility] is reviewing this Pt. [facility] has requested IVC paperwork to be sent via fax to completed [facility] referral for review. CSW will continue to monitor the patient to secure recommended disposition.    Bunnie Gallop, MSW, LCSW-A  6:29 PM 08/29/2023

## 2023-08-29 NOTE — ED Notes (Addendum)
 Patient made threats about killing herself. Sitter went and removed the cords from the room. Sitter attempted taking the room phone when the patient grabbed the phone and threw it on the ground towards the sitter. Security on stand by.

## 2023-08-29 NOTE — ED Notes (Signed)
 Patient is sleep and we have her tray by bed side.

## 2023-08-29 NOTE — ED Notes (Addendum)
 I sat down with the patient to help her calm down as she had become very agitated seeing the security gathered inside her room. She was upset about being IVCd here in the hospital and it being her son's birthday. She was also upset about not being able to shower and not having anything to eat since yesterday. Sitter offered patient something to eat, she refused it. Patient was able to calm down and went back to sleep.   Patient is not dressed out. Will attempt for her to shower after breakfast and change into scrubs when she wakes up.

## 2023-08-29 NOTE — ED Notes (Signed)
 Provider went and spoke to the patient. Patient got upset at provider and slammed the door shut. Security called and at bedside.

## 2023-08-29 NOTE — ED Notes (Signed)
 Pt take shower but refused changes out.

## 2023-08-29 NOTE — ED Notes (Signed)
 Patient made her first phone call at this moment.

## 2023-08-29 NOTE — ED Notes (Signed)
 Patient said she wants to kill herself.

## 2023-08-29 NOTE — ED Notes (Signed)
 Vitals deferred at this time, pt resting quietly

## 2023-08-29 NOTE — ED Notes (Addendum)
 Pt notified this NT that she had to use the bathroom and became very agitated when this NT followed; this NT calmly explained that per sitter protocol she needs to be in my line of sight and within arms distance at all times; patient cussing at this NT and making threats, RN witnessed and aware. Pt also received two phone calls from pt's brother, and pt stated on the phone that she is intending to kill herself. RN notified.

## 2023-08-29 NOTE — ED Provider Notes (Signed)
 Emergency Medicine Observation Re-evaluation Note  Tina [name] is a 43 y.o. female, seen on rounds today.  Pt initially presented to the ED for complaints of Aggressive Behavior Currently, the patient is angry, swearing at staff, yelling she is going to kill herself today.   Physical Exam  BP 106/72 (BP Location: Left Arm)   Pulse 84   Temp 98.3 F (36.8 C) (Oral)   Resp 18   SpO2 100%  Physical Exam General: agitated, pacing the room Lungs: No respiratory distress Psych: Swearing at staff, agitated, threatening to kill herself   ED Course / MDM  EKG:   I have reviewed the labs performed to date as well as medications administered while in observation.  Recent changes in the last 24 hours include agitation requiring IM medication administration.   Plan  Current plan is for Geodon  now for patient's agitation/aggression. She was given some yesterday and responded well. Waiting for psych consult     Gennaro Duwaine CROME, DO 08/29/23 9195

## 2023-08-30 DIAGNOSIS — F4312 Post-traumatic stress disorder, chronic: Secondary | ICD-10-CM

## 2023-08-30 DIAGNOSIS — F419 Anxiety disorder, unspecified: Secondary | ICD-10-CM

## 2023-08-30 DIAGNOSIS — F332 Major depressive disorder, recurrent severe without psychotic features: Secondary | ICD-10-CM

## 2023-08-30 MED ORDER — ACETAMINOPHEN 325 MG PO TABS
650.0000 mg | ORAL_TABLET | Freq: Four times a day (QID) | ORAL | Status: DC | PRN
  Administered 2023-08-30 – 2023-08-31 (×3): 650 mg via ORAL
  Filled 2023-08-30 (×3): qty 2

## 2023-08-30 NOTE — Consult Note (Signed)
 [facility] Psychiatric Consult Initial  Patient Name: .Tina [name]  MRN: [id number]  DOB: 08-Apr-1980  Consult Order details:  Orders (From admission, onward)     Start     Ordered   08/29/23 1017  CONSULT TO CALL ACT TEAM       Ordering Provider: Gennaro Duwaine CROME, DO  Provider:  (Not yet assigned)  Question:  Reason for Consult?  Answer:  aggression, suicide, ams   08/29/23 1017             Mode of Visit: In person    Psychiatry Consult Evaluation  Service Date: August 30, 2023 LOS:  LOS: 0 days  Chief Complaint IVC for threats of self-harm and aggressive behavior. History significant for traumatic loss of her mother (2 years ago), methamphetamine and cannabis use, psychosocial instability, and untreated psychiatric conditions (depression, anxiety, PTSD per collateral).  Primary Psychiatric Diagnoses  Major Depressive Disorder, recurrent, severe (r/o substance-induced mood disorder) 2.   PTSD, chronic 3.  Anxiety  Assessment  Tina [name] is a 43 y.o. female admitted: Presented to the EDfor 08/28/2023  6:56 PM for IVC for threats of self-harm and aggressive behavior. History significant for traumatic loss of her mother (2 years ago), methamphetamine and cannabis use, psychosocial instability, and untreated psychiatric conditions (depression, anxiety, PTSD per collateral).. She carries the psychiatric diagnoses of major depressive disorder, PTSD, and anxiety and has a past medical history of none.    Recent suicidal threats (text messages, verbal threats, intent to overdose). Aggressive and unsafe behavior in a psychiatric setting. History of substance use (meth, current cannabis dependence). Multiple psychosocial stressors: homelessness, unemployment, loss of mother, strained family dynamics. Risk Factors: Active substance use, trauma history, untreated psychiatric illness, unstable housing, limited support, prior suicidal communication. Protective Factors:  Marriage (supportive husband), willingness to engage minimally with provider, denies SI/HI during evaluation. Please see plan below for detailed recommendations.    Diagnoses:  Active Hospital problems: Principal Problem:   MDD (major depressive disorder), recurrent episode (HCC) Active Problems:   Generalized anxiety disorder    Plan   ## Psychiatric Medication Recommendations:  We will start patient on BuSpar  7.5 mg p.o. twice daily for anxiety Zyprexa  5 mg p.o. twice daily for mood Zoloft  25 mg p.o. daily for depression  ## Medical Decision Making Capacity: Not specifically addressed in this encounter  ## Further Work-up:  -- No further workup needed at this time EKG, While pt on Qtc prolonging medications, please monitor & replete K+ to 4 and Mg2+ to 2, or UDS -- Updated EKG ordered on 08/30/2023 -- Pertinent labwork reviewed earlier this admission includes: CBC, CMP, EKG, UDS   ## Disposition:-- We recommend inpatient psychiatric hospitalization after medical hospitalization. Patient has been involuntarily committed on 08/30/2023.   ## Behavioral / Environmental: -Difficult Patient (SELECT OPTIONS FROM BELOW), To minimize splitting of staff, assign one staff person to communicate all information from the team when feasible., or Utilize compassion and acknowledge the patient's experiences while setting clear and realistic expectations for care.    ## Safety and Observation Level:  - Based on my clinical evaluation, I estimate the patient to be at moderate risk of self harm in the current setting. - At this time, we recommend  1:1 Observation. This decision is based on my review of the chart including patient's history and current presentation, interview of the patient, mental status examination, and consideration of suicide risk including evaluating suicidal ideation, plan, intent, suicidal or self-harm behaviors, risk  factors, and protective factors. This judgment is based on our  ability to directly address suicide risk, implement suicide prevention strategies, and develop a safety plan while the patient is in the clinical setting. Please contact our team if there is a concern that risk level has changed.  CSSR Risk Category:   Suicide Risk Assessment: Patient has following modifiable risk factors for suicide: untreated depression, recklessness, current symptoms: anxiety/panic, insomnia, impulsivity, anhedonia, hopelessness, and recent loss (death, isolation, vocation), which we are addressing by many inpatient psychiatric admission. Patient has following non-modifiable or demographic risk factors for suicide: none per patient report Patient has the following protective factors against suicide: Supportive friends and Minor children in the home  Thank you for this consult request. Recommendations have been communicated to the primary team.  We will need to follow patient at this time.   Shenicka Sunderlin MOTLEY-MANGRUM, PMHNP       History of Present Illness  Relevant Aspects of Hospital ED Course:   43 year old female, brought under IVC for threats of self-harm and aggressive behavior. History significant for traumatic loss of her mother (2 years ago), methamphetamine and cannabis use, psychosocial instability, and untreated psychiatric conditions (depression, anxiety, PTSD per collateral). [facility] to [facility], transferred to ED for escalating aggressive behavior. Per IVC, patient made threats to overdose and sent ~100 text messages to her brother stating her intent. Also threatened physical aggression toward an unknown female. In the ED, patient demonstrated door slamming, threats of self-harm, and agitation.  On reassessment today: Patient is irritable and tearful, questioning why she is here, suspects her brother initiated IVC. Reports use of THC vape prior to being picked up. Patient denies SI/HI/AVH currently, though documentation and staff  report indicate threats of suicide yesterday. Reports daily marijuana use ("all day everyday and I ain't gone stop"). Denies methamphetamine use at present, though history of "ICE" (meth) abuse documented. Denies alcohol use. Stressors: homelessness, strained family dynamics, unemployment, and missing her child's birthday.  During evaluation Tina [name] is sitting on the edge of bed, and is in moderate distress. She is alert, oriented x 4, irritable but cooperative with provider. Episodes of aggression toward staff noted yesterday. Her mood is "Confused," reports frustration with tearful, labile affect. She has Rapid, irritable tone.speech, and behavior.  Thought process is tangential, easily distracted by stressors.  Thought content currently denies SI/HI, though recent credible suicidal statements noted.  No covert delusions elicited.  Perceptions, denies AVH, though history of hearing different things.  Insight and judgment are poor.   Psych ROS:  Depression: Endorses Anxiety: Endorses Mania (lifetime and current): Denies Psychosis: (lifetime and current): Denies  Collateral information:  Husband, Darica Goren: Describes patient as a "respectful and dependent woman," feels brother overreacted with IVC. Acknowledges multiple psychosocial stressors (death of mother and sister, financial strain, unstable housing). Believes patient suffers from anxiety and would benefit from psychiatric medication.  Review of Systems  Psychiatric/Behavioral:  Positive for depression and suicidal ideas.      Psychiatric and Social History  Psychiatric History:  Information collected from patient and chart review  Prev Dx/Sx: Depression Current Psych Provider: Per patient report, supposedly starting a new psychiatric provider next week  Home Meds (current): None Previous Med Trials: Denies Therapy: Supposedly is seeing a new therapist next week  Prior Psych Hospitalization: Denies Prior Self  Harm: Denies Prior Violence: Yes  Family Psych History: Yes Family Hx suicide: Denies  Social History:  Developmental Hx: Deferred Educational Hx: Graduated  12th grade Occupational Hx: Unemployed Legal Hx: Yes Living Situation: Lives with brother, but may be homeless Spiritual Hx: Yes Access to weapons/lethal means: Denies  Substance History Alcohol: Denies, but past history Number of drinks per day denies History of alcohol withdrawal seizures denies History of DT's denies Tobacco: Yes Illicit drugs: Yes Prescription drug abuse: Denies Rehab hx: Denies  Exam Findings  Physical Exam:  Vital Signs:  Temp:  [97.6 F (36.4 C)-98.6 F (37 C)] 98.6 F (37 C) (08/17 0648) Pulse Rate:  [51-72] 56 (08/17 0648) Resp:  [16-18] 16 (08/17 0648) BP: (109-127)/(69-76) 127/70 (08/17 0648) SpO2:  [97 %-100 %] 97 % (08/17 0648) Blood pressure 127/70, pulse (!) 56, temperature 98.6 F (37 C), resp. rate 16, SpO2 97%. There is no height or weight on file to calculate BMI.  Physical Exam Vitals and nursing note reviewed. Exam conducted with a chaperone present.  Psychiatric:        Attention and Perception: Attention normal.        Mood and Affect: Mood is depressed. Affect is labile and tearful.        Speech: Speech is rapid and pressured.        Behavior: Behavior is agitated. Behavior is cooperative.        Cognition and Memory: Memory normal.        Judgment: Judgment is impulsive and inappropriate.     Mental Status Exam: General Appearance: Casual  Orientation:  Full (Time, Place, and Person)  Memory:  Immediate;   Fair  Concentration:  Concentration: Fair  Recall:  Fair  Attention  Fair  Eye Contact:  Fair  Speech:  Clear and Coherent and Pressured  Language:  Fair  Volume:  Normal  Mood: "Confused," reports frustration.  Affect:  Tearful, labile.  Thought Process:  Tangential, easily distracted by stressors.  Thought Content:  Currently denies SI/HI, though  recent credible suicidal statements noted. No overt delusions elicited.  Suicidal Thoughts:  Currently denies SI/HI, though recent credible suicidal statements noted. No overt delusions elicited.  Homicidal Thoughts:  No  Judgement:  Poor  Insight:  Lacking  Psychomotor Activity:  Normal  Akathisia:  NA  Fund of Knowledge:  Fair      Assets:  Manufacturing systems engineer Desire for Improvement Financial Resources/Insurance Social Support  Cognition:  WNL  ADL's:  Intact  AIMS (if indicated):        Other History   These have been pulled in through the EMR, reviewed, and updated if appropriate.  Family History:  The patient's family history includes Cancer in her paternal grandfather, paternal grandmother, and another family member; Colon cancer in her paternal grandfather and paternal uncle; Depression in her mother; Diabetes in her brother, father, and maternal grandmother; Heart disease in her maternal grandmother; Hyperlipidemia in her father; Hypertension in her father and maternal grandmother; Kidney failure in an other family member; Mental illness in her mother.  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   Hyperlipidemia    Hypocalcemia 12/21/2022   Iron  deficiency anemia due to chronic blood loss 11/18/2016   Major depression    MDD (major depressive disorder), recurrent episode (HCC) 03/16/2017   Menorrhagia    Microcytic anemia 12/21/2022   Migraine    Protein-calorie  malnutrition, severe 07/29/2022   Pruritus ani 04/26/2009   Qualifier: Diagnosis of   By: Norleen MD, [place] ORN        Smokers' cough (HCC)    albuterol  (PROVENTIL  HFA;VENTOLIN  HFA)   Splenic rupture 05/26/2022   Thyroid  nodule 06/06/2016   Wears glasses    09/03/2020    Surgical History: Past Surgical History:  Procedure Laterality Date   BIOPSY THYROID   06/06/2016   LEFT THYROIDECTOMY   CESAREAN SECTION  05/21/2004   CESAREAN SECTION WITH BILATERAL TUBAL LIGATION Bilateral 08/29/2014   Procedure: CESAREAN SECTION WITH BILATERAL TUBAL LIGATION;  Surgeon: Krystal Deaner, MD;  Location: [facility];  Service: Obstetrics;  Laterality: Bilateral;  MD requests RNFA [name] confirmed 7/29...tms   CHOLECYSTECTOMY N/A 03/26/2015   Procedure: LAPAROSCOPIC CHOLECYSTECTOMY;  Surgeon: Herlene Righter Kinsinger, MD;  Location: [facility];  Service: General;  Laterality: N/A;   DILATATION & CURETTAGE/HYSTEROSCOPY WITH MYOSURE N/A 09/05/2020   Procedure: DILATATION & CURETTAGE/HYSTEROSCOPY WITH MYOSURE;  Surgeon: Deaner Krystal, MD;  [facility];  Service: Gynecology;  Laterality: N/A;   DILITATION & CURRETTAGE/HYSTROSCOPY WITH NOVASURE ABLATION N/A 01/28/2016   Procedure: DILATATION & CURETTAGE/HYSTEROSCOPY WITH NOVASURE ABLATION;  Surgeon: Krystal Deaner, MD;  Location: [facility];  Service: Gynecology;  Laterality: N/A;   GASTRIC BYPASS     done in 2021, 09/03/2020   LAPAROTOMY N/A 11/26/2021   Procedure: EXPLORATORY LAPAROTOMY, GRAHAM PATCH REPAIR OF PERFORATED ULCER;  Surgeon: Belinda Cough, MD;  Location: [facility];  Service: General;  Laterality: N/A;   LAPAROTOMY N/A 07/27/2022   Procedure: EXPLORATORY LAPAROTOMY WITH CLOSURE OF GASTROJEJUNOSTOMY MARGIN ULCER;  Surgeon: Vanderbilt Ned, MD;  Location: [facility];  Service: General;  Laterality: N/A;   LUMBAR MICRODISCECTOMY  11/17/2007   L5-  S1   THYROIDECTOMY Left 06/06/2016   Procedure: LEFT THYROIDECTOMY AND CORE  BIOPSY OF RIGHT THYROID ;  Surgeon: Carlie Clark, MD;  Location: [facility];  Service: ENT;  Laterality: Left;   TONSILLECTOMY  2001   TUBAL LIGATION       Medications:   Current Facility-Administered Medications:    busPIRone  (BUSPAR ) tablet 7.5 mg, 7.5 mg, Oral, BID, Motley-Mangrum, Jakory Matsuo A, PMHNP, 7.5 mg at 08/29/23 2155   OLANZapine  zydis (ZYPREXA ) disintegrating tablet 5 mg, 5 mg, Oral, BID, Motley-Mangrum, Levetta Bognar A, PMHNP, 5 mg at 08/30/23 1044   pantoprazole  (PROTONIX ) EC tablet 40 mg, 40 mg, Oral, Daily, Curatolo, Adam, DO, 40 mg at 08/30/23 1044   sertraline  (ZOLOFT ) tablet 25 mg, 25 mg, Oral, Daily, Motley-Mangrum, Konstantinos Cordoba A, PMHNP, 25 mg at 08/30/23 1044   ziprasidone  (GEODON ) injection 20 mg, 20 mg, Intramuscular, Once, Kammerer, Megan L, DO  Current Outpatient Medications:    ferrous sulfate  325 (65 FE) MG tablet, Take 1 tablet (325 mg total) by mouth every other day., Disp: 45 tablet, Rfl: 1   pantoprazole  (PROTONIX ) 40 MG tablet, Take 1 tablet (40 mg total) by mouth daily., Disp: 90 tablet, Rfl: 1   sucralfate  (CARAFATE ) 1 GM/10ML suspension, Take 10 mLs (1 g total) by mouth 4 (four) times daily -  with meals and at bedtime., Disp: 420 mL, Rfl: 0  Allergies: Allergies  Allergen Reactions   Nsaids Other (See Comments)    Perforated ulcers   Tramadol Other (See Comments) and Rash    Triggers migraines also  tramadol   Oxycodone  Itching and Other (See Comments)    SEVERE itching with Percocet    Oxycodone -Acetaminophen  Itching    acetaminophen  / oxycodone    Wellbutrin  [Bupropion ] Hives    Sally-Anne Wamble MOTLEY-MANGRUM, PMHNP

## 2023-08-30 NOTE — ED Provider Notes (Addendum)
 Emergency Medicine Observation Re-evaluation Note  Tina [name] is a 43 y.o. female, seen on rounds today.  Pt initially presented to the ED for complaints of Aggressive Behavior Currently, the patient is resting.  Physical Exam  BP 127/70   Pulse (!) 56   Temp 98.6 F (37 C)   Resp 16   SpO2 97%  Physical Exam General: nad   ED Course / MDM  EKG:   I have reviewed the labs performed to date as well as medications administered while in observation.  Recent changes in the last 24 hours include episodes of anger, agitation, aggressive towards staff last night.  Plan  Current plan is for inpt psych.    Randol Simmonds, MD 08/30/23 0800  EKG performed at 11:50 AM.  Unusual P axis short PR probable junctional rhythm, normal ST-T waves.  Normal QRS, QTc at 440   Randol Simmonds, MD 08/30/23 1206

## 2023-08-30 NOTE — Progress Notes (Signed)
 Patient has been denied by [facility] due to no appropriate beds available. Patient meets BH inpatient criteria per Cathaleen Adam, PMHNP. Patient has been faxed out to the following facilities:   [facility]  [address] [place] [address] [phone]  [facility]  [address] [place] [address] [phone]  [facility]  [address] [place] [address] [phone]  [facility]  [address] [place] [address] [phone]  [facility]  [address] [place], [place] [address] [phone]  [facility] Patient Placement  [facility], [place] [phone]  [facility]  [address] [place] [address] [phone]  [facility] EFAX  [address] [place], [place] [phone]  [facility]  [address] [place] [address] [phone]  [facility]  [address], [place] [address] [phone]  [facility]  [address] [place] [address] [phone]  [facility]  [address] [place], [place] [address] [phone]  [facility]  [address] [place] [address] [phone]  [facility]  [address] [place] [address] [phone]  [facility]  [address] [place] [address] [phone]  [facility]  [address], [place] [address] [phone]  [facility]  [address], [place] [address] [phone]    Bunnie Gallop, MSW, LCSW-A  11:27 AM 08/30/2023

## 2023-08-31 ENCOUNTER — Inpatient Hospital Stay: Admission: AD | Admit: 2023-08-31 | Payer: MEDICAID | Source: Intra-hospital | Admitting: Psychiatry

## 2023-08-31 MED ORDER — QUETIAPINE FUMARATE 25 MG PO TABS
25.0000 mg | ORAL_TABLET | Freq: Once | ORAL | Status: AC
  Administered 2023-08-31: 25 mg via ORAL
  Filled 2023-08-31: qty 1

## 2023-08-31 NOTE — ED Provider Notes (Signed)
 Emergency Medicine Observation Re-evaluation Note  Britney Diane Nurse is a 43 y.o. female, seen on rounds today.  Pt initially presented to the ED for complaints of Aggressive Behavior Currently, the patient is resting.  Physical Exam  BP 119/75 (BP Location: Right Arm)   Pulse (!) 59   Temp 98 F (36.7 C) (Oral)   Resp 16   SpO2 97%  Physical Exam General: NAD   ED Course / MDM  EKG:   I have reviewed the labs performed to date as well as medications administered while in observation.  Recent changes in the last 24 hours include no acute events reported.  Plan  Current plan is for placement.    Laurice Maude BROCKS, MD 08/31/23 [phone]

## 2023-08-31 NOTE — Progress Notes (Signed)
 LCSW Progress Note  [id number]   Tina [name]  08/31/2023  10:17 AM  Description:   Inpatient Psychiatric Referral  Patient was recommended inpatient per Sherrell Culver NP . There are no [facility] beds at [facility], per [facility] RN). Patient was referred to the following out of network facilities:   Destination  Service Provider Address Phone Fax  [facility]  [address] [place] [address] [phone]  [facility]  [address], [place] [address] [phone]  [facility]  [address], [place] [address] [phone]  [facility]  [address], [place] [address] [phone]  [facility]  [address] [place], [place] [address] [phone]  [facility] Patient Placement  [facility], [place] [phone]  [facility]  [address], [place] [address] [phone]  [facility] EFAX  [address] [place], [place] [phone]  [facility]  [address] [place] [address] [phone]  [facility]  [address], [place] [address] [phone]  [facility]  [address] [place] [address] [phone]  [facility]  [address] [place], [place] [address] [phone]  [facility]  [address] [place] [address] [phone]  [facility]  [address], [place] [address] [phone]  [facility]  [address]  [place] [address] [phone]  [facility]  [address] [place] [address] [phone]  [facility]  [address] [place] [address] [phone]      Situation ongoing, CSW to continue following and update chart as more information becomes available.      [place] Elouise Divelbiss MSW, LCSW  08/31/2023 10:17 AM

## 2023-08-31 NOTE — ED Notes (Signed)
 [facility] made aware of AM transport and message left on answer service at [facility]. [phone]

## 2023-08-31 NOTE — Progress Notes (Addendum)
 Pt was accepted to [facility] TODAY 08/31/2023 . Bed assignment: 307  Pt meets inpatient criteria per: Jadeka Mangrum NP  Attending Physician will be: Dr. Donnelly MD   Report can be called to: [phone]  Pt can arrive after: [facility] WILL UPDATE   Care Team Notified:  Cherylynn Ernst RN,Jadeka Mangrum NP, Landry Bull RN  [place] Draxton Luu LCSW-A   08/31/2023 1:58 PM

## 2023-08-31 NOTE — ED Notes (Signed)
 Patient has been alert this shift. Patient has been cooperative. Patient medication compliant.  No suicidal ideation or homicidal ideation noted. No paranoia or delusion noted.

## 2023-08-31 NOTE — ED Notes (Signed)
 PT resting, equal rise and fall of chest

## 2023-08-31 NOTE — ED Notes (Addendum)
   Pt was accepted to [facility] TODAY 08/31/2023 . Bed assignment: 307     Pt meets inpatient criteria per: Jadeka Mangrum NP   Attending Physician will be: Dr. Donnelly MD    Report can be called to: [phone]   Pt can arrive after: [facility] WILL UPDATE    Care Team Notified:  Cherylynn Ernst RN,Jadeka Mangrum NP, Landry Bull RN   [place] LCSW-A    08/31/2023 1:58 PM    Call to [facility] to notify them that the pt will arrive in the AM by sheriff   transport d/t IVC order inplace. ARM-C house supervisor [facility] RN notified

## 2023-09-01 ENCOUNTER — Inpatient Hospital Stay (HOSPITAL_COMMUNITY)
Admission: AD | Admit: 2023-09-01 | Discharge: 2023-09-03 | DRG: 885 | Disposition: A | Discharge status: Home / Self Care | Payer: MEDICAID | Source: Intra-hospital

## 2023-09-01 ENCOUNTER — Encounter (HOSPITAL_COMMUNITY): Payer: Self-pay | Admitting: Psychiatry

## 2023-09-01 ENCOUNTER — Other Ambulatory Visit: Payer: Self-pay

## 2023-09-01 DIAGNOSIS — F1520 Other stimulant dependence, uncomplicated: Secondary | ICD-10-CM | POA: Diagnosis present

## 2023-09-01 DIAGNOSIS — Z833 Family history of diabetes mellitus: Secondary | ICD-10-CM | POA: Diagnosis not present

## 2023-09-01 DIAGNOSIS — F4010 Social phobia, unspecified: Secondary | ICD-10-CM | POA: Diagnosis present

## 2023-09-01 DIAGNOSIS — F4310 Post-traumatic stress disorder, unspecified: Secondary | ICD-10-CM | POA: Diagnosis present

## 2023-09-01 DIAGNOSIS — E785 Hyperlipidemia, unspecified: Secondary | ICD-10-CM | POA: Diagnosis present

## 2023-09-01 DIAGNOSIS — Z5900 Homelessness unspecified: Secondary | ICD-10-CM

## 2023-09-01 DIAGNOSIS — F333 Major depressive disorder, recurrent, severe with psychotic symptoms: Secondary | ICD-10-CM | POA: Diagnosis not present

## 2023-09-01 DIAGNOSIS — F411 Generalized anxiety disorder: Secondary | ICD-10-CM | POA: Diagnosis present

## 2023-09-01 DIAGNOSIS — Z9049 Acquired absence of other specified parts of digestive tract: Secondary | ICD-10-CM

## 2023-09-01 DIAGNOSIS — Z560 Unemployment, unspecified: Secondary | ICD-10-CM | POA: Diagnosis not present

## 2023-09-01 DIAGNOSIS — Z803 Family history of malignant neoplasm of breast: Secondary | ICD-10-CM | POA: Diagnosis not present

## 2023-09-01 DIAGNOSIS — E890 Postprocedural hypothyroidism: Secondary | ICD-10-CM | POA: Diagnosis present

## 2023-09-01 DIAGNOSIS — F3341 Major depressive disorder, recurrent, in partial remission: Secondary | ICD-10-CM | POA: Diagnosis present

## 2023-09-01 DIAGNOSIS — Z885 Allergy status to narcotic agent status: Secondary | ICD-10-CM

## 2023-09-01 DIAGNOSIS — R45851 Suicidal ideations: Secondary | ICD-10-CM | POA: Diagnosis present

## 2023-09-01 DIAGNOSIS — F331 Major depressive disorder, recurrent, moderate: Secondary | ICD-10-CM | POA: Diagnosis not present

## 2023-09-01 DIAGNOSIS — F909 Attention-deficit hyperactivity disorder, unspecified type: Secondary | ICD-10-CM | POA: Diagnosis present

## 2023-09-01 DIAGNOSIS — Z8632 Personal history of gestational diabetes: Secondary | ICD-10-CM | POA: Diagnosis not present

## 2023-09-01 DIAGNOSIS — Z800 Family history of malignant neoplasm of digestive organs: Secondary | ICD-10-CM

## 2023-09-01 DIAGNOSIS — Z79899 Other long term (current) drug therapy: Secondary | ICD-10-CM

## 2023-09-01 DIAGNOSIS — Z8711 Personal history of peptic ulcer disease: Secondary | ICD-10-CM

## 2023-09-01 DIAGNOSIS — Z87891 Personal history of nicotine dependence: Secondary | ICD-10-CM

## 2023-09-01 DIAGNOSIS — G4700 Insomnia, unspecified: Secondary | ICD-10-CM | POA: Diagnosis present

## 2023-09-01 DIAGNOSIS — F319 Bipolar disorder, unspecified: Principal | ICD-10-CM | POA: Diagnosis present

## 2023-09-01 DIAGNOSIS — Z818 Family history of other mental and behavioral disorders: Secondary | ICD-10-CM

## 2023-09-01 DIAGNOSIS — F329 Major depressive disorder, single episode, unspecified: Secondary | ICD-10-CM | POA: Diagnosis present

## 2023-09-01 DIAGNOSIS — Z9884 Bariatric surgery status: Secondary | ICD-10-CM | POA: Diagnosis not present

## 2023-09-01 DIAGNOSIS — Z888 Allergy status to other drugs, medicaments and biological substances status: Secondary | ICD-10-CM

## 2023-09-01 DIAGNOSIS — F1021 Alcohol dependence, in remission: Secondary | ICD-10-CM | POA: Diagnosis present

## 2023-09-01 DIAGNOSIS — Z8249 Family history of ischemic heart disease and other diseases of the circulatory system: Secondary | ICD-10-CM | POA: Diagnosis not present

## 2023-09-01 DIAGNOSIS — Z8349 Family history of other endocrine, nutritional and metabolic diseases: Secondary | ICD-10-CM | POA: Diagnosis not present

## 2023-09-01 DIAGNOSIS — Z886 Allergy status to analgesic agent status: Secondary | ICD-10-CM

## 2023-09-01 MED ORDER — DIPHENHYDRAMINE HCL 25 MG PO CAPS: 50.0000 mg | ORAL_CAPSULE | Freq: Three times a day (TID) | ORAL | Status: DC | PRN

## 2023-09-01 MED ORDER — HALOPERIDOL LACTATE 5 MG/ML IJ SOLN: 10.0000 mg | Freq: Three times a day (TID) | INTRAMUSCULAR | Status: DC | PRN

## 2023-09-01 MED ORDER — LORAZEPAM 2 MG/ML IJ SOLN: 2.0000 mg | Freq: Three times a day (TID) | INTRAMUSCULAR | Status: DC | PRN

## 2023-09-01 MED ORDER — BUSPIRONE HCL 7.5 MG PO TABS
7.5000 mg | ORAL_TABLET | Freq: Two times a day (BID) | ORAL | Status: DC
  Administered 2023-09-01 – 2023-09-02 (×2): 7.5 mg via ORAL
  Filled 2023-09-01 (×2): qty 1

## 2023-09-01 MED ORDER — NICOTINE POLACRILEX 2 MG MT GUM
2.0000 mg | CHEWING_GUM | OROMUCOSAL | Status: DC | PRN
  Administered 2023-09-01 – 2023-09-03 (×2): 2 mg via ORAL
  Filled 2023-09-01: qty 1

## 2023-09-01 MED ORDER — HALOPERIDOL LACTATE 5 MG/ML IJ SOLN: 5.0000 mg | Freq: Three times a day (TID) | INTRAMUSCULAR | Status: DC | PRN

## 2023-09-01 MED ORDER — DIPHENHYDRAMINE HCL 50 MG/ML IJ SOLN: 50.0000 mg | Freq: Three times a day (TID) | INTRAMUSCULAR | Status: DC | PRN

## 2023-09-01 MED ORDER — ACETAMINOPHEN 325 MG PO TABS
650.0000 mg | ORAL_TABLET | Freq: Four times a day (QID) | ORAL | Status: DC | PRN
  Administered 2023-09-02: 650 mg via ORAL
  Filled 2023-09-01: qty 2

## 2023-09-01 MED ORDER — ALUM & MAG HYDROXIDE-SIMETH 200-200-20 MG/5ML PO SUSP: 30.0000 mL | ORAL | Status: DC | PRN

## 2023-09-01 MED ORDER — HALOPERIDOL 5 MG PO TABS: 5.0000 mg | ORAL_TABLET | Freq: Three times a day (TID) | ORAL | Status: DC | PRN

## 2023-09-01 MED ORDER — SERTRALINE HCL 25 MG PO TABS
25.0000 mg | ORAL_TABLET | Freq: Every day | ORAL | Status: DC
  Administered 2023-09-02: 25 mg via ORAL
  Filled 2023-09-01: qty 1

## 2023-09-01 MED ORDER — MAGNESIUM HYDROXIDE 400 MG/5ML PO SUSP: 30.0000 mL | Freq: Every day | ORAL | Status: DC | PRN

## 2023-09-01 MED ORDER — OLANZAPINE 5 MG PO TBDP
5.0000 mg | ORAL_TABLET | Freq: Two times a day (BID) | ORAL | Status: DC
  Administered 2023-09-01 – 2023-09-02 (×2): 5 mg via ORAL
  Filled 2023-09-01 (×2): qty 1

## 2023-09-01 MED ORDER — NICOTINE 14 MG/24HR TD PT24
14.0000 mg | MEDICATED_PATCH | Freq: Every day | TRANSDERMAL | Status: DC
  Administered 2023-09-01 – 2023-09-03 (×3): 14 mg via TRANSDERMAL
  Filled 2023-09-01 (×3): qty 1

## 2023-09-01 MED ORDER — MELATONIN 5 MG PO TABS
5.0000 mg | ORAL_TABLET | Freq: Every evening | ORAL | Status: DC | PRN
  Administered 2023-09-01 – 2023-09-02 (×2): 5 mg via ORAL
  Filled 2023-09-01 (×2): qty 1

## 2023-09-01 MED ORDER — HYDROXYZINE HCL 25 MG PO TABS
25.0000 mg | ORAL_TABLET | Freq: Three times a day (TID) | ORAL | Status: DC | PRN
  Administered 2023-09-01 – 2023-09-02 (×2): 25 mg via ORAL
  Filled 2023-09-01 (×2): qty 1

## 2023-09-01 NOTE — Progress Notes (Signed)
 Called Police [phone] To transport patient to [facility].

## 2023-09-01 NOTE — [facility] Group Notes (Signed)
 Adult Psychoeducational Group Note  Date:  09/01/2023 Time:  8:57 PM  Group Topic/Focus:  Wrap-Up Group:   The focus of this group is to help patients review their daily goal of treatment and discuss progress on daily workbooks.  Participation Level:  Active  Participation Quality:  Appropriate  Affect:  Appropriate  Cognitive:  Appropriate  Insight: Appropriate  Engagement in Group:  Engaged  Modes of Intervention:  Discussion  Additional Comments:  Neve said positive part of there day seeing her husband.  Lang Drilling Long 09/01/2023, 8:57 PM

## 2023-09-01 NOTE — Progress Notes (Signed)
 Heather called from [facility] to receive report. Report was given.

## 2023-09-01 NOTE — Progress Notes (Signed)
 Pt has been accepted to [facility] on 09/01/2023 Bed assignment: 407-02  Pt meets inpatient criteria per: Cathaleen Jacobson NP  Attending Physician will az:Ejdyjbjw MD.   Report can be called to: unit: Adult unit: [phone]  Pt can arrive after [facility] WILL UPDATE   Care Team Notified: [facility] Cherylynn Ernst RN, Cathaleen Jacobson NP,   [place] Tashyra Adduci LCSW-A   09/01/2023 8:28 AM

## 2023-09-01 NOTE — ED Notes (Signed)
 Tina [name] is currently c/o increased anxiety and is requesting her HS medications early. She was informed that her medications were on the way she became very hostel and belligerent towards staff and was asked if she would like something in addition to her HS medications. She agreed and was given a PRN dose of seroquel  25 mg po.

## 2023-09-01 NOTE — ED Provider Notes (Signed)
 Emergency Medicine Observation Re-evaluation Note  Tina [name] is a 43 y.o. female, seen on rounds today.  Pt initially presented to the ED for complaints of Aggressive Behavior Currently, the patient is resting.  Physical Exam  BP (!) 113/56 (BP Location: Left Arm)   Pulse (!) 59   Temp (!) 97.5 F (36.4 C) (Oral)   Resp 16   SpO2 99%  Physical Exam General: NAd   ED Course / MDM  EKG:EKG Interpretation Date/Time:  Sunday August 30 2023 11:50:29 EDT Ventricular Rate:  63 PR Interval:  126 QRS Duration:  78 QT Interval:  430 QTC Calculation: 440 R Axis:   65  Text Interpretation: Unusual P axis and short PR, probable junctional rhythm Abnormal ECG No previous ECGs available Confirmed by Haze Lonni PARAS [phone]) on 08/31/2023 6:57:40 PM  I have reviewed the labs performed to date as well as medications administered while in observation.   Plan  Current plan is for placement - [facility].    Laurice Maude BROCKS, MD 09/01/23 [phone]

## 2023-09-01 NOTE — Progress Notes (Signed)
 Admission Note:  pt arrived under IVC from ED. Pt awake and alert. Pt was irritable and argumentative.  She was taken in to the search room and skin assessment performed by [facility] RN.  Pt allowed vitals to be checked and then refused to answer any questions.  Per IVC paperwork pt has a history of substance abuse.  She refused to elaborate or provide any further information.  After search pt was brought to her room and given clean scrubs and hygiene supplies.  She laid down until lunch.  Q checks started for safety.

## 2023-09-01 NOTE — Tx Team (Signed)
 Initial Treatment Plan 09/01/2023 4:59 PM [name] FMW:[id number]    PATIENT STRESSORS: loss of mother.   Substance abuse issues     PATIENT STRENGTHS: Capable of independent living  Communication skills    PATIENT IDENTIFIED PROBLEMS: Loss of mother.                     DISCHARGE CRITERIA:  Ability to meet basic life and health needs Improved stabilization in mood, thinking, and/or behavior Reduction of life-threatening or endangering symptoms to within safe limits Verbal commitment to aftercare and medication compliance  PRELIMINARY DISCHARGE PLAN: Shelter   PATIENT/FAMILY INVOLVEMENT: This treatment plan has been presented to and reviewed with the patient, Tina [name]  The patient and family have been given the opportunity to ask questions and make suggestions.  Belle Camellia Olszewski, RN 09/01/2023, 4:59 PM

## 2023-09-02 ENCOUNTER — Telehealth: Payer: Self-pay | Admitting: *Deleted

## 2023-09-02 MED ORDER — BUSPIRONE HCL 10 MG PO TABS
10.0000 mg | ORAL_TABLET | Freq: Two times a day (BID) | ORAL | Status: DC
  Administered 2023-09-03: 10 mg via ORAL
  Filled 2023-09-02: qty 1

## 2023-09-02 MED ORDER — BUSPIRONE HCL 7.5 MG PO TABS
7.5000 mg | ORAL_TABLET | Freq: Two times a day (BID) | ORAL | Status: AC
  Administered 2023-09-02: 7.5 mg via ORAL
  Filled 2023-09-02: qty 1

## 2023-09-02 MED ORDER — SERTRALINE HCL 50 MG PO TABS
50.0000 mg | ORAL_TABLET | Freq: Every day | ORAL | Status: DC
  Administered 2023-09-03: 50 mg via ORAL
  Filled 2023-09-02: qty 1

## 2023-09-02 MED ORDER — MELATONIN 5 MG PO TABS
5.0000 mg | ORAL_TABLET | Freq: Once | ORAL | Status: AC
  Administered 2023-09-02: 5 mg via ORAL
  Filled 2023-09-02: qty 1

## 2023-09-02 NOTE — [facility] Suicide Risk Assessment (Signed)
 [facility] INPATIENT:  Family/Significant Other Suicide Prevention Education  Suicide Prevention Education:  Education Completed; Sahory Nordling (husband / not married) [phone],  (name of family member/significant other) has been identified by the patient as the family member/significant other with whom the patient will be residing, and identified as the person(s) who will aid the patient in the event of a mental health crisis (suicidal ideations/suicide attempt).  With written consent from the patient, the family member/significant other has been provided the following suicide prevention education, prior to the and/or following the discharge of the patient.  Husband said that he will pick up patient tomorrow, Thursday, 09/02/2023.    He said they don't have any guns or weapons, patient doesn't have access to any guns or weapons.  He didn't have any safety concerns about patient being discharged.   The suicide prevention education provided includes the following: Suicide risk factors Suicide prevention and interventions National Suicide Hotline telephone number [facility] assessment telephone number [facility] Emergency Assistance 911 [facility] and/or Residential Mobile Crisis Unit telephone number  Request made of family/significant other to: Remove weapons (e.g., guns, rifles, knives), all items previously/currently identified as safety concern.   Remove drugs/medications (over-the-counter, prescriptions, illicit drugs), all items previously/currently identified as a safety concern.  The family member/significant other verbalizes understanding of the suicide prevention education information provided.  The family member/significant other agrees to remove the items of safety concern listed above.  Kemal Amores O Samora Jernberg, LCSWA 09/02/2023, 4:18 PM

## 2023-09-02 NOTE — H&P (Signed)
 Psychiatric Admission Assessment Adult  Patient Identification: Tina [name] MRN:  [id number] Date of Evaluation:  09/02/2023 Chief Complaint:  Bipolar 1 disorder (HCC) [F31.9] MDD (major depressive disorder) [F32.9] Principal Diagnosis: MDD (major depressive disorder), recurrent episode (HCC) Diagnosis:  Principal Problem:   MDD (major depressive disorder), recurrent episode (HCC) Active Problems:   Generalized anxiety disorder   Methamphetamine use disorder, moderate (HCC)  History of Present Illness: The patient is a 43 y/o female with a history of depression, anxiety, recently homelessness, and intermittent methamphetamine use who was admitted involuntarily for reported suicidal ideation. She initially presented to [facility] under police escort on 8/15 after being found at her brother's house, the petitioner, and was described initially as irritable and argumentative. She was then transferred to [facility] ED. ED documentation indicates that the patient was acting and speaking defiantly to staff, was resistant to engagement, vulgar, and yelled at staff and told them that she was going to kill herself. ED staff report that the patient sent many text messages to family members also expressing suicidal ideation.   On assessment today the patient was friendly and cooperative, and reported that she felt much improved since admission. She stated that she was initially very upset with the involuntary detention as she felt her brother overreacted and infringed upon her rights. She reports that she has been homeless for the past year, largely living in a tent in the woods in [place], but has intermittently stayed with her family in the area, specifically her father and brother. She reports that two weeks ago she drove with her partner down to [facility], [place] to work on [employer] and her brother made a missing persons report on her. She reports that his motivation was that he believed she would be  using methamphetamine; per the ED he reported that she took their father's truck without notifying anyone. While in [facility] on 8/8 she was stopped by police to investigate the missing persons report and brought to a local ED for making a suicidal statement. She was discharged the same day from the ED as she was not felt to be a threat to herself and to have made the comment out of frustration. She reports that after returning from [facility] she spent the night at her brother's house and in the morning two sheriffs picked her up and escorted her to the [facility] in handcuffs. She reports that she was livid with the situation, that she intentionally raised hell in the ED and felt betrayed by her family and that they consistently hold my past over my head and won't let me move forward. She explained that she has a history of heavy alcohol use and methamphetamine use and believes that her family hyperfocuses on this. She specifically states that she does not remember making suicidal statements prior to presenting to the [facility] or ED, but doesn't deny that she may have made such a statement. She reports that she has in the past made comments about being or feeling suicidal without actually meaning it, expressing it mostly out of frustration. She adamantly denies suicidal ideation today, denies any history of suicidal behaviors, and states that she is too much of a coward to do anything. She states that she is done with her family as she feels she cannot count of them or trust them.   In term of psychiatric symptoms, she endorses a history of PTSD related to multiple past traumas, anxiety, and depression. She denies currently feeling clinically depressed or experiencing  hopelessness or anhedonia, but states that in the past she believes she has experienced such episodes and she reports previously being diagnosed with depression. She reported experiencing chronic and pervasive anxiety that is difficult to control, excessive, and  causes difficulty focusing, irritability, and insomnia. She additionally reports experiencing chronic social anxiety that is often inhibiting. She does not endorse prominent re-experiencing, clear avoidance, or hyperarousal. She denies ever experiencing periods of sustained elevated mood with increased energy, goal directed activity, impulsivity, and lack of sleep when not using substances. She denies any significant history of psychotic symptoms.   Past Psychiatric History: Per patient, she has prior diagnoses of PTSD, depression, anxiety, and ADHD. She reports a history of multiple medication trials but only recalls taking fluoxetine  (ineffective) and hydroxyzine  (effective) specifically. She denies any history of suicidal behaviors. She denies any prior psychiatric admissions or SUD treatment.    [place] Scale:  Flowsheet Row Ad[facility] (Current) [facility] from 08/28/2023 in [facility] ED from 08/15/2023 in [facility] Emergency Department at [facility]  C-SSRS RISK CATEGORY No Risk No Risk No Risk      Alcohol Screening: Patient refused Alcohol Screening Tool: Yes  Past Medical History:  Past Medical History:  Diagnosis Date   Alcohol use disorder, severe, in early remission (HCC) 12/21/2022   Allergic rhinitis    Anxiety    Arthritis    In her back and legs per pt, 09/03/2020   Bipolar disorder (HCC)    Diagnosised 10 yrs ago doesn't take any meds 09/03/2020   Cannabis use disorder 12/21/2022   Chronic thoracic back pain    GERD (gastroesophageal reflux disease)    H/O Gastrojejunal ulcer with recurrent perforation 07/28/2022   History of gestational diabetes    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   Hyperlipidemia    Hypocalcemia 12/21/2022   Iron  deficiency anemia due to chronic blood loss 11/18/2016   Major depression    MDD (major depressive disorder), recurrent episode  (HCC) 03/16/2017   Menorrhagia    Microcytic anemia 12/21/2022   Migraine    Protein-calorie malnutrition, severe 07/29/2022   Pruritus ani 04/26/2009   Qualifier: Diagnosis of   By: Norleen MD, [place] ORN        Smokers' cough (HCC)    albuterol  (PROVENTIL  HFA;VENTOLIN  HFA)   Splenic rupture 05/26/2022   Thyroid  nodule 06/06/2016   Wears glasses    09/03/2020    Past Surgical History:  Procedure Laterality Date   BIOPSY THYROID   06/06/2016   LEFT THYROIDECTOMY   CESAREAN SECTION  05/21/2004   CESAREAN SECTION WITH BILATERAL TUBAL LIGATION Bilateral 08/29/2014   Procedure: CESAREAN SECTION WITH BILATERAL TUBAL LIGATION;  Surgeon: Krystal Deaner, MD;  Location: [facility];  Service: Obstetrics;  Laterality: Bilateral;  MD requests RNFA [name] confirmed 7/29...tms   CHOLECYSTECTOMY N/A 03/26/2015   Procedure: LAPAROSCOPIC CHOLECYSTECTOMY;  Surgeon: Herlene Righter Kinsinger, MD;  Location: [facility];  Service: General;  Laterality: N/A;   DILATATION & CURETTAGE/HYSTEROSCOPY WITH MYOSURE N/A 09/05/2020   Procedure: DILATATION & CURETTAGE/HYSTEROSCOPY WITH MYOSURE;  Surgeon: Deaner Krystal, MD;  Location: [facility];  Service: Gynecology;  Laterality: N/A;   DILITATION & CURRETTAGE/HYSTROSCOPY WITH NOVASURE ABLATION N/A 01/28/2016   Procedure: DILATATION & CURETTAGE/HYSTEROSCOPY WITH NOVASURE ABLATION;  Surgeon: Krystal Deaner, MD;  Location: [facility];  Service: Gynecology;  Laterality: N/A;   GASTRIC BYPASS     done in 2021, 09/03/2020  LAPAROTOMY N/A 11/26/2021   Procedure: EXPLORATORY LAPAROTOMY, GRAHAM PATCH REPAIR OF PERFORATED ULCER;  Surgeon: Belinda Cough, MD;  Location: [facility];  Service: General;  Laterality: N/A;   LAPAROTOMY N/A 07/27/2022   Procedure: EXPLORATORY LAPAROTOMY WITH CLOSURE OF GASTROJEJUNOSTOMY MARGIN ULCER;  Surgeon: Vanderbilt Ned, MD;  Location: [facility];  Service: General;  Laterality: N/A;   LUMBAR MICRODISCECTOMY  11/17/2007   L5-   S1   THYROIDECTOMY Left 06/06/2016   Procedure: LEFT THYROIDECTOMY AND CORE BIOPSY OF RIGHT THYROID ;  Surgeon: Carlie Clark, MD;  Location: [facility];  Service: ENT;  Laterality: Left;   TONSILLECTOMY  2001   TUBAL LIGATION     Family History:  Family History  Problem Relation Age of Onset   Depression Mother    Mental illness Mother    Diabetes Father    Hypertension Father    Hyperlipidemia Father    Diabetes Maternal Grandmother        Dialysis   Hypertension Maternal Grandmother    Heart disease Maternal Grandmother    Cancer Paternal Grandmother        Breast Cancer   Cancer Paternal Grandfather        colon   Colon cancer Paternal Grandfather    Diabetes Brother    Cancer Other        colon   Kidney failure Other        dialysis   Colon cancer Paternal Uncle    Esophageal cancer Neg Hx    Stomach cancer Neg Hx    Rectal cancer Neg Hx    Family Psychiatric  History: Mother - depression and attempted suicide  Tobacco Screening:  Social History   Tobacco Use  Smoking Status Former   Current packs/day: 0.00   Average packs/day: 0.5 packs/day for 20.0 years (10.0 ttl pk-yrs)   Types: Cigarettes   Start date: 01/14/1996   Quit date: 01/14/2016   Years since quitting: 7.6  Smokeless Tobacco Never    BH Tobacco Counseling     Are you interested in Tobacco Cessation Medications?  No value filed. Counseled patient on smoking cessation:  No value filed. Reason Tobacco Screening Not Completed: No value filed.       Social History:  Social History   Substance and Sexual Activity  Alcohol Use No     Social History   Substance and Sexual Activity  Drug Use No    Additional Social History: In a long-term committed relationship with her partner, whom she calls her husband. She is divorced. She has two children, ages 63 and 67. The 12 y/o is currently staying with her father. She has shared custody of him with his biological father. She is unemployed but works  sometimes for [employer]. She does not receive disability.       Allergies:   Allergies  Allergen Reactions   Nsaids Other (See Comments)    Perforated ulcers   Oxycodone  Itching and Other (See Comments)    SEVERE itching with Percocet    Tramadol Other (See Comments) and Rash    Triggers migraines also  tramadol   Wellbutrin  [Bupropion ] Hives   Lab Results: No results found for this or any previous visit (from the past 48 hours).  Blood Alcohol level:  Lab Results  Component Value Date   [facility] <15 08/28/2023   ETH <15 05/20/2023    Metabolic Disorder Labs:  Lab Results  Component Value Date   HGBA1C 5.7 (H) 12/20/2022   MPG 116.89  12/20/2022   No results found for: PROLACTIN Lab Results  Component Value Date   CHOL 191 06/23/2012   TRIG 45 07/28/2022   HDL 40.90 06/23/2012   CHOLHDL 5 06/23/2012   VLDL 32.4 06/23/2012   LDLCALC 118 (H) 06/23/2012    Current Medications: Current Facility-Administered Medications  Medication Dose Route Frequency Provider Last Rate Last Admin   acetaminophen  (TYLENOL ) tablet 650 mg  650 mg Oral Q6H PRN Motley-Mangrum, Jadeka A, PMHNP       alum & mag hydroxide-simeth (MAALOX/MYLANTA) 200-200-20 MG/5ML suspension 30 mL  30 mL Oral Q4H PRN Motley-Mangrum, Jadeka A, PMHNP       busPIRone  (BUSPAR ) tablet 7.5 mg  7.5 mg Oral BID Motley-Mangrum, Jadeka A, PMHNP   7.5 mg at 09/02/23 0934   haloperidol  (HALDOL ) tablet 5 mg  5 mg Oral TID PRN Motley-Mangrum, Jadeka A, PMHNP       And   diphenhydrAMINE  (BENADRYL ) capsule 50 mg  50 mg Oral TID PRN Motley-Mangrum, Jadeka A, PMHNP       haloperidol  lactate (HALDOL ) injection 5 mg  5 mg Intramuscular TID PRN Motley-Mangrum, Jadeka A, PMHNP       And   diphenhydrAMINE  (BENADRYL ) injection 50 mg  50 mg Intramuscular TID PRN Motley-Mangrum, Jadeka A, PMHNP       And   LORazepam  (ATIVAN ) injection 2 mg  2 mg Intramuscular TID PRN Motley-Mangrum, Jadeka A, PMHNP       haloperidol  lactate  (HALDOL ) injection 10 mg  10 mg Intramuscular TID PRN Motley-Mangrum, Jadeka A, PMHNP       And   diphenhydrAMINE  (BENADRYL ) injection 50 mg  50 mg Intramuscular TID PRN Motley-Mangrum, Jadeka A, PMHNP       And   LORazepam  (ATIVAN ) injection 2 mg  2 mg Intramuscular TID PRN Motley-Mangrum, Jadeka A, PMHNP       hydrOXYzine  (ATARAX ) tablet 25 mg  25 mg Oral TID PRN Motley-Mangrum, Jadeka A, PMHNP   25 mg at 09/01/23 2119   magnesium  hydroxide (MILK OF MAGNESIA) suspension 30 mL  30 mL Oral Daily PRN Motley-Mangrum, Jadeka A, PMHNP       melatonin tablet 5 mg  5 mg Oral QHS PRN Onuoha, Chinwendu V, NP   5 mg at 09/01/23 2119   nicotine  (NICODERM CQ  - dosed in mg/24 hours) patch 14 mg  14 mg Transdermal Daily Prentis Kitchens A, DO   14 mg at 09/02/23 9063   nicotine  polacrilex (NICORETTE ) gum 2 mg  2 mg Oral PRN Prentis Kitchens A, DO   2 mg at 09/01/23 1811   OLANZapine  zydis (ZYPREXA ) disintegrating tablet 5 mg  5 mg Oral BID Motley-Mangrum, Jadeka A, PMHNP   5 mg at 09/02/23 9065   sertraline  (ZOLOFT ) tablet 25 mg  25 mg Oral Daily Motley-Mangrum, Jadeka A, PMHNP   25 mg at 09/02/23 9065   PTA Medications: Medications Prior to Admission  Medication Sig Dispense Refill Last Dose/Taking   ferrous sulfate  325 (65 FE) MG tablet Take 1 tablet (325 mg total) by mouth every other day. (Patient not taking: Reported on 08/30/2023) 45 tablet 1    pantoprazole  (PROTONIX ) 40 MG tablet Take 1 tablet (40 mg total) by mouth daily. (Patient not taking: Reported on 08/30/2023) 90 tablet 1    sucralfate  (CARAFATE ) 1 GM/10ML suspension Take 10 mLs (1 g total) by mouth 4 (four) times daily -  with meals and at bedtime. (Patient not taking: Reported on 08/30/2023) 420 mL 0  Musculoskeletal: Normal gait and station  Mental Status Exam: Appearance - Wearing hospital scrubs  Attitude - Pleasant, polite, not guarded Speech - normal volume, prosody, inflection Mood - Better Affect - Full, not labile Thought  Process - LLGD Thought Content - No delusional TC SI/HI - Denies Perceptions - Denies; not RIS Judgement/Insight - Fair to good Fund of knowledge - WNL Language - No impairments Physical Exam Constitutional:      Appearance: Normal appearance. She is normal weight.  HENT:     Head: Normocephalic and atraumatic.  Eyes:     Extraocular Movements: Extraocular movements intact.  Pulmonary:     Effort: Pulmonary effort is normal.  Musculoskeletal:        General: Normal range of motion.  Neurological:     Mental Status: She is alert.    Review of Systems  Constitutional: Negative.   Respiratory: Negative.    Cardiovascular: Negative.   Musculoskeletal:  Positive for back pain.  Skin: Negative.    Blood pressure 118/80, pulse 82, temperature 98 F (36.7 C), temperature source Oral, resp. rate 15, height 5' 7 (1.702 m), weight 61.7 kg, SpO2 100%. Body mass index is 21.3 kg/m.  Assessment and Plan: Ms. Zulema Hilliker is a 43 y/o female with a history of polysubstance abuse, depression, anxiety, and homelessness for the past year who was admitted involuntarily for expressed suicidal ideation in the context of familial stressors. She endorses symptoms of anxiety consistent with GAD and a history of likely MDEs consistent with MDD, though she denies current significant depressive symptoms. She endorses a history of heavy and problematic alcohol use consistent with AUD in rmission and also intermittently uses methamphetamine. The medical record indicates prior diagnoses of BPAD, however, these symptoms appear to have exclusively occurred during periods of substance intoxication.   She does not appear to be actively suicidal on assessment, and it appears that she has a history of making suicidal comments when frustrated. She does not meet ongoing criteria for involuntary commitment but is willing to participate in a short hospitalization to facilitate restarting medication and coordinating  follow-up.   # Major Depressive Disorder, recurrent, in partial remission # Generalized Anxiety Disorder # PTSD, by history - Sertraline  50 mg daily - Buspirone  7.5 mg BID today, 10 mg BID tomorrow  # Alcohol use disorder, in early partial remission # Methamphetamine use disorder, moderate - Ongoing intermittent use of methamphetamine. Patient not interested in services at this time.    Observation Level/Precautions:  15 minute checks  Laboratory:  N/A  Psychotherapy:  Group  Medications:  As above  Consultations:  SW  Discharge Concerns:  Homelessness  Estimated LOS: 1-3 days     Physician Treatment Plan for Primary Diagnosis: MDD (major depressive disorder), recurrent episode (HCC) Long Term Goal(s): Improvement in symptoms so as ready for discharge  Short Term Goals: Ability to identify changes in lifestyle to reduce recurrence of condition will improve, Ability to identify and develop effective coping behaviors will improve, Compliance with prescribed medications will improve, and Ability to identify triggers associated with substance abuse/mental health issues will improve  Physician Treatment Plan for Secondary Diagnosis: Principal Problem:   MDD (major depressive disorder), recurrent episode (HCC) Active Problems:   Generalized anxiety disorder   Methamphetamine use disorder, moderate (HCC)  Long Term Goal(s): Improvement in symptoms so as ready for discharge  Short Term Goals: Ability to disclose and discuss suicidal ideas, Ability to identify and develop effective coping behaviors will improve, Compliance with prescribed  medications will improve, and Ability to identify triggers associated with substance abuse/mental health issues will improve  I certify that inpatient services furnished can reasonably be expected to improve the patient's condition.    Oliva DELENA Salmon, DO 8/20/202511:05 AM
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
## 2023-09-02 NOTE — Progress Notes (Signed)
(  Sleep Hours) -7 (Any PRNs that were needed, meds refused, or side effects to meds)- melatonin, atarax , and nicotine  gum.  (Any disturbances and when (visitation, over night)-none (Concerns raised by the patient)- none (SI/HI/AVH)-Denied

## 2023-09-02 NOTE — Plan of Care (Signed)
   Problem: Education: Goal: Emotional status will improve Outcome: Progressing Goal: Mental status will improve Outcome: Progressing Goal: Verbalization of understanding the information provided will improve Outcome: Progressing

## 2023-09-02 NOTE — Progress Notes (Signed)
   09/01/23 2300  Psych Admission Type (Psych Patients Only)  Admission Status Involuntary  Psychosocial Assessment  Patient Complaints Anxiety  Eye Contact Fair  Facial Expression Anxious  Affect Anxious  Speech Logical/coherent  Interaction Assertive  Motor Activity Restless  Appearance/Hygiene Unremarkable  Behavior Characteristics Anxious;Cooperative  Mood Anxious;Pleasant  Aggressive Behavior  Effect No apparent injury  Thought Process  Coherency WDL  Content WDL  Delusions WDL  Perception WDL  Hallucination None reported or observed  Judgment Impaired  Confusion WDL  Danger to Self  Current suicidal ideation? Denies

## 2023-09-02 NOTE — Telephone Encounter (Signed)
 Will refer to C. Boone  Copied from [employer] #[id number]. Topic: General - Other >> Sep 02, 2023 11:52 AM Miquel SAILOR wrote: Reason for CRM: Gordy from [facility] [phone]: Needs call abck on update if received  CMN and stadard written order. Faxed on 08/08 and 08/20

## 2023-09-02 NOTE — Group Note (Signed)
 Date:  09/02/2023 Time:  4:11 PM  Group Topic/Focus: Sleep/ Wellness Education Wellness Toolbox:   The focus of this group is to discuss various aspects of wellness, balancing those aspects and exploring ways to increase the ability to experience wellness.  Patients will create a wellness toolbox for use upon discharge.    Participation Level:  Active  Participation Quality:  Appropriate  Affect:  Appropriate  Cognitive:  Alert and Appropriate  Insight: Appropriate  Engagement in Group:  Engaged  Modes of Intervention:  Discussion  Additional Comments:  Patient was engaged appropriately during the group.  Melicia Esqueda D Nelissa Bolduc 09/02/2023, 4:11 PM

## 2023-09-02 NOTE — BHH Suicide Risk Assessment (Signed)
 [facility] Admission Suicide Risk Assessment   Nursing information obtained from:  Patient Demographic factors:  NA Current Mental Status:  NA Loss Factors:  NA Historical Factors:  NA Risk Reduction Factors:  NA  Total Time spent with patient: 1 hour Principal Problem: Recurrent major depression in partial remission (HCC) Diagnosis:  Principal Problem:   Recurrent major depression in partial remission (HCC) Active Problems:   Generalized anxiety disorder   Methamphetamine use disorder, moderate (HCC)  Subjective Data: See H&P  Continued Clinical Symptoms:    The Alcohol Use Disorders Identification Test, Guidelines for Use in Primary Care, Second Edition.  [employer] [facility]). Score between 0-7:  no or low risk or alcohol related problems. Score between 8-15:  moderate risk of alcohol related problems. Score between 16-19:  high risk of alcohol related problems. Score 20 or above:  warrants further diagnostic evaluation for alcohol dependence and treatment.   CLINICAL FACTORS:   Generalized Anxiety Disorder, MDD, substance use disorder   COGNITIVE FEATURES THAT CONTRIBUTE TO RISK:  None    SUICIDE RISK:   Moderate:  Frequent suicidal ideation with limited intensity, and duration, some specificity in terms of plans, no associated intent, good self-control, limited dysphoria/symptomatology, some risk factors present, and identifiable protective factors, including available and accessible social support.  PLAN OF CARE: See H&P  I certify that inpatient services furnished can reasonably be expected to improve the patient's condition.   Tina DELENA Salmon, DO 09/02/2023, 3:39 PM

## 2023-09-02 NOTE — Group Note (Signed)
 Date:  09/02/2023 Time:  9:24 AM  Group Topic/Focus:  Goals Group:   The focus of this group is to help patients establish daily goals to achieve during treatment and discuss how the patient can incorporate goal setting into their daily lives to aide in recovery. Orientation:   The focus of this group is to educate the patient on the purpose and policies of crisis stabilization and provide a format to answer questions about their admission.  The group details unit policies and expectations of patients while admitted.    Participation Level:  Did Not Attend   Tina [name] 09/02/2023, 9:24 AM

## 2023-09-02 NOTE — Plan of Care (Signed)

## 2023-09-03 ENCOUNTER — Encounter (HOSPITAL_COMMUNITY): Payer: Self-pay

## 2023-09-03 DIAGNOSIS — F1520 Other stimulant dependence, uncomplicated: Secondary | ICD-10-CM

## 2023-09-03 DIAGNOSIS — F411 Generalized anxiety disorder: Secondary | ICD-10-CM

## 2023-09-03 DIAGNOSIS — F331 Major depressive disorder, recurrent, moderate: Secondary | ICD-10-CM

## 2023-09-03 MED ORDER — SERTRALINE HCL 50 MG PO TABS: 50.0000 mg | ORAL_TABLET | Freq: Every day | ORAL | 0 refills | Status: AC

## 2023-09-03 MED ORDER — BUSPIRONE HCL 10 MG PO TABS: 10.0000 mg | ORAL_TABLET | Freq: Two times a day (BID) | ORAL | 0 refills | Status: AC

## 2023-09-03 MED ORDER — GABAPENTIN 600 MG PO TABS: 600.0000 mg | ORAL_TABLET | Freq: Two times a day (BID) | ORAL | 0 refills | Status: AC

## 2023-09-03 NOTE — Plan of Care (Signed)

## 2023-09-03 NOTE — Plan of Care (Signed)
  Problem: Education: Goal: Knowledge of [place] [facility] Education information/materials will improve Outcome: Progressing Goal: Mental status will improve Outcome: Progressing Goal: Verbalization of understanding the information provided will improve Outcome: Progressing   Problem: Activity: Goal: Sleeping patterns will improve Outcome: Progressing   Problem: Coping: Goal: Ability to verbalize frustrations and anger appropriately will improve Outcome: Progressing

## 2023-09-03 NOTE — BH IP Treatment Plan (Signed)
 Interdisciplinary Treatment and Diagnostic Plan Update  09/02/2023 Time of Session: 10:05 AM Ethelmae Diane Buday MRN: [id number]  Principal Diagnosis: Recurrent major depression in partial remission (HCC)  Secondary Diagnoses: Principal Problem:   Recurrent major depression in partial remission (HCC) Active Problems:   Generalized anxiety disorder   Methamphetamine use disorder, moderate (HCC)   Current Medications:  Current Facility-Administered Medications  Medication Dose Route Frequency Provider Last Rate Last Admin   acetaminophen  (TYLENOL ) tablet 650 mg  650 mg Oral Q6H PRN Motley-Mangrum, Jadeka A, PMHNP   650 mg at 09/02/23 1603   alum & mag hydroxide-simeth (MAALOX/MYLANTA) 200-200-20 MG/5ML suspension 30 mL  30 mL Oral Q4H PRN Motley-Mangrum, Jadeka A, PMHNP       busPIRone  (BUSPAR ) tablet 10 mg  10 mg Oral BID Prentis Kitchens A, DO   10 mg at 09/03/23 9265   haloperidol  (HALDOL ) tablet 5 mg  5 mg Oral TID PRN Motley-Mangrum, Jadeka A, PMHNP       And   diphenhydrAMINE  (BENADRYL ) capsule 50 mg  50 mg Oral TID PRN Motley-Mangrum, Jadeka A, PMHNP       haloperidol  lactate (HALDOL ) injection 5 mg  5 mg Intramuscular TID PRN Motley-Mangrum, Jadeka A, PMHNP       And   diphenhydrAMINE  (BENADRYL ) injection 50 mg  50 mg Intramuscular TID PRN Motley-Mangrum, Jadeka A, PMHNP       And   LORazepam  (ATIVAN ) injection 2 mg  2 mg Intramuscular TID PRN Motley-Mangrum, Jadeka A, PMHNP       haloperidol  lactate (HALDOL ) injection 10 mg  10 mg Intramuscular TID PRN Motley-Mangrum, Jadeka A, PMHNP       And   diphenhydrAMINE  (BENADRYL ) injection 50 mg  50 mg Intramuscular TID PRN Motley-Mangrum, Jadeka A, PMHNP       And   LORazepam  (ATIVAN ) injection 2 mg  2 mg Intramuscular TID PRN Motley-Mangrum, Jadeka A, PMHNP       hydrOXYzine  (ATARAX ) tablet 25 mg  25 mg Oral TID PRN Motley-Mangrum, Jadeka A, PMHNP   25 mg at 09/02/23 2116   magnesium  hydroxide (MILK OF MAGNESIA) suspension 30 mL   30 mL Oral Daily PRN Motley-Mangrum, Jadeka A, PMHNP       melatonin tablet 5 mg  5 mg Oral QHS PRN Onuoha, Chinwendu V, NP   5 mg at 09/02/23 2115   nicotine  (NICODERM CQ  - dosed in mg/24 hours) patch 14 mg  14 mg Transdermal Daily Prentis Kitchens A, DO   14 mg at 09/03/23 9265   nicotine  polacrilex (NICORETTE ) gum 2 mg  2 mg Oral PRN Prentis Kitchens A, DO   2 mg at 09/01/23 1811   sertraline  (ZOLOFT ) tablet 50 mg  50 mg Oral Daily Prentis Kitchens A, DO   50 mg at 09/03/23 9265   PTA Medications: Medications Prior to Admission  Medication Sig Dispense Refill Last Dose/Taking   ferrous sulfate  325 (65 FE) MG tablet Take 1 tablet (325 mg total) by mouth every other day. (Patient not taking: Reported on 08/30/2023) 45 tablet 1    pantoprazole  (PROTONIX ) 40 MG tablet Take 1 tablet (40 mg total) by mouth daily. (Patient not taking: Reported on 08/30/2023) 90 tablet 1    sucralfate  (CARAFATE ) 1 GM/10ML suspension Take 10 mLs (1 g total) by mouth 4 (four) times daily -  with meals and at bedtime. (Patient not taking: Reported on 08/30/2023) 420 mL 0     Patient Stressors:    Patient Strengths: Capable of independent  living  Communication skills   Treatment Modalities: Medication Management, Group therapy, Case management,  1 to 1 session with clinician, Psychoeducation, Recreational therapy.   Physician Treatment Plan for Primary Diagnosis: Recurrent major depression in partial remission (HCC) Long Term Goal(s): Improvement in symptoms so as ready for discharge   Short Term Goals: Ability to disclose and discuss suicidal ideas Ability to identify and develop effective coping behaviors will improve Compliance with prescribed medications will improve Ability to identify triggers associated with substance abuse/mental health issues will improve Ability to identify changes in lifestyle to reduce recurrence of condition will improve  Medication Management: Evaluate patient's response, side effects,  and tolerance of medication regimen.  Therapeutic Interventions: 1 to 1 sessions, Unit Group sessions and Medication administration.  Evaluation of Outcomes: Not Progressing  Physician Treatment Plan for Secondary Diagnosis: Principal Problem:   Recurrent major depression in partial remission (HCC) Active Problems:   Generalized anxiety disorder   Methamphetamine use disorder, moderate (HCC)  Long Term Goal(s): Improvement in symptoms so as ready for discharge   Short Term Goals: Ability to disclose and discuss suicidal ideas Ability to identify and develop effective coping behaviors will improve Compliance with prescribed medications will improve Ability to identify triggers associated with substance abuse/mental health issues will improve Ability to identify changes in lifestyle to reduce recurrence of condition will improve     Medication Management: Evaluate patient's response, side effects, and tolerance of medication regimen.  Therapeutic Interventions: 1 to 1 sessions, Unit Group sessions and Medication administration.  Evaluation of Outcomes: Not Progressing   RN Treatment Plan for Primary Diagnosis: Recurrent major depression in partial remission (HCC) Long Term Goal(s): Knowledge of disease and therapeutic regimen to maintain health will improve  Short Term Goals: Ability to remain free from injury will improve, Ability to verbalize frustration and anger appropriately will improve, Ability to demonstrate self-control, Ability to participate in decision making will improve, Ability to verbalize feelings will improve, Ability to disclose and discuss suicidal ideas, Ability to identify and develop effective coping behaviors will improve, and Compliance with prescribed medications will improve  Medication Management: RN will administer medications as ordered by provider, will assess and evaluate patient's response and provide education to patient for prescribed medication. RN  will report any adverse and/or side effects to prescribing provider.  Therapeutic Interventions: 1 on 1 counseling sessions, Psychoeducation, Medication administration, Evaluate responses to treatment, Monitor vital signs and CBGs as ordered, Perform/monitor CIWA, COWS, AIMS and Fall Risk screenings as ordered, Perform wound care treatments as ordered.  Evaluation of Outcomes: Not Progressing   LCSW Treatment Plan for Primary Diagnosis: Recurrent major depression in partial remission (HCC) Long Term Goal(s): Safe transition to appropriate next level of care at discharge, Engage patient in therapeutic group addressing interpersonal concerns.  Short Term Goals: Engage patient in aftercare planning with referrals and resources, Increase social support, Increase ability to appropriately verbalize feelings, Increase emotional regulation, Facilitate acceptance of mental health diagnosis and concerns, Facilitate patient progression through stages of change regarding substance use diagnoses and concerns, Identify triggers associated with mental health/substance abuse issues, and Increase skills for wellness and recovery  Therapeutic Interventions: Assess for all discharge needs, 1 to 1 time with Social worker, Explore available resources and support systems, Assess for adequacy in community support network, Educate family and significant other(s) on suicide prevention, Complete Psychosocial Assessment, Interpersonal group therapy.  Evaluation of Outcomes: Not Progressing   Progress in Treatment: Attending groups: Yes. Participating in groups: Yes. Taking medication  as prescribed: Yes. Toleration medication: Yes. Family/Significant other contact made: Yes, individual(s) contacted:  Meta Kroenke (husband / not married) [phone] Patient understands diagnosis: Yes. Discussing patient identified problems/goals with staff: Yes. Medical problems stabilized or resolved: Yes. Denies  suicidal/homicidal ideation: Yes. Issues/concerns per patient self-inventory: No.   New problem(s) identified:  No  New Short Term/Long Term Goal(s):   Patient recently admitted. CSW will continue to follow and assess for appropriate referrals and possible discharge planning.   Patient Goals:  I want to make sure I'm set up with a therapist and psychiatrist.  Discharge Plan or Barriers: Patient recently admitted. CSW will continue to follow and assess for appropriate referrals and possible discharge planning.    Reason for Continuation of Hospitalization: Anxiety Depression Medication stabilization Suicidal ideation  Estimated Length of Stay:  5 - 7 days  Last 3 [place] Suicide Severity Risk Score: Flowsheet Row [facility] (Current[facility]28/2023 [facility] from 08/15/2023 in [facility]  C-SSRS RISK CATEGORY No Risk No Risk No Risk    Last [facility] 2/9 Scores:    08/11/2023    8:49 AM 07/02/2023    3:45 PM 12/23/2022    8:15 AM  Depression screen PHQ 2/9  Decreased Interest 2 2 1   Down, Depressed, Hopeless 2 1 1   PHQ - 2 Score 4 3 2   Altered sleeping 1 2   Tired, decreased energy 2 2   Change in appetite 2 0   Feeling bad or failure about yourself  3 1   Trouble concentrating 1 1   Moving slowly or fidgety/restless 2 0   Suicidal thoughts 1    PHQ-9 Score 16 9   Difficult doing work/chores Very difficult Somewhat difficult     Scribe for Treatment Team: Adelaido Nicklaus O Nicky Kras, LCSWA 09/02/2023
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
## 2023-09-03 NOTE — Group Note (Signed)
 Date:  09/03/2023 Time:  4:12 AM  Group Topic/Focus:  Wrap-Up Group:   The focus of this group is to help patients review their daily goal of treatment and discuss progress on daily workbooks.    Participation Level:  Active  Participation Quality:  Appropriate and Sharing  Affect:  Appropriate  Cognitive:  Appropriate  Insight: Appropriate  Engagement in Group:  Engaged  Modes of Intervention:  Discussion and Socialization  Additional Comments:  Patient rated her day a 10 out of 10. Patient goal for today handling my anxiety and not crying, Patient somewhat achieve goal, stating; crying yes, anxiety almost. Patient shared coping skills that she finds most helpful, group. Patient shared something that she likes about herself, my will to still be nice and strong to move forward and not look back. Patient did have any questions/concerns.  Eward Mace 09/03/2023, 4:12 AM

## 2023-09-03 NOTE — BHH Group Notes (Signed)
 Spirituality Group   Description: Participant directed exploration of values, beliefs and meaning (Theme of shame/guilt/resilience through self-compassion)  Following a brief framework of chaplain's role and ground rules of group behavior, participants are invited to share concerns or questions that engage spiritual life. Emphasis placed on common themes and shared experiences and ways to make meaning and clarify living into one's values.   Theory/Process/Goal: Utilize the theoretical framework of group therapy established by Celena Kite, Relational Cultural Theory and Rogerian approaches to facilitate relational empathy and use of the "here and now" to foster reflection, self-awareness, and sharing.   Observations: Tina [name] was actively engaged and participated in the group discussion. She engaged in some significant processing of ideas from recent reading and brought that into her group reflection.  Maurine Mowbray L. Fredrica, M.Div [phone]

## 2023-09-03 NOTE — Progress Notes (Addendum)
 Conversation with patient:   CSW gave patient a list of shelter resources (for several counties, per her request).   CSW gave patient information about [facility] (grief counseling).      Naylin Burkle, LCSWA 09/02/2023

## 2023-09-03 NOTE — Progress Notes (Signed)
 Conversation with patient:  CSW gave patient resources: - [facility] (patient said she is on the waiting list but will call to the check on the status) - job resources   Ebonee Stober, LCSWA 09/03/2023

## 2023-09-03 NOTE — Progress Notes (Addendum)
  [facility] Adult Case Management Discharge Plan :  Will you be returning to the same living situation after discharge:  No.  Patient will stay with her husband, Cletus Mehlhoff (husband) [phone]  At discharge, do you have transportation home?: Yes,  patient's husband, Laquan Ludden, will pick her up at 11 AM Do you have the ability to pay for your medications: Yes,  patient has insurance  Release of information consent forms completed and in the chart;  Patient's signature needed at discharge.  Patient to Follow up at:  Follow-up Information     [facility] Follow up on 10/05/2023.   Specialty: Behavioral Health Why: You have an appointment for medication management services on 10/05/23 at 1:00 pm.  You also have an appointment for therapy services on 10/13/23 at 1:00 pm. Contact information: [address] [place]  [address] [phone]                Next level of care provider has access to [facility] Link:no  Safety Planning and Suicide Prevention discussed: Yes,  Delaynee Alred (husband / not married) [phone]  Has patient been referred to the Quitline?: Yes, a referral has been submitted (Quitline).  Patient was given a print-out.  Patient has been referred for addiction treatment: At admission, patient tested positive for marijuana.  Patient refused referral for treatment.   Alice Vitelli O Jimie Kuwahara, LCSWA 09/03/2023, 9:05 AM

## 2023-09-03 NOTE — Progress Notes (Signed)
   09/03/23 0400  Psych Admission Type (Psych Patients Only)  Admission Status Involuntary  Psychosocial Assessment  Patient Complaints Anxiety  Eye Contact Fair  Facial Expression Animated  Affect Appropriate to circumstance  Speech Logical/coherent  Interaction Assertive  Motor Activity Other (Comment) (WDL)  Appearance/Hygiene Unremarkable  Behavior Characteristics Cooperative  Mood Pleasant  Aggressive Behavior  Effect No apparent injury  Thought Process  Coherency WDL  Content WDL  Delusions WDL  Perception WDL  Hallucination None reported or observed  Judgment WDL  Confusion None  Danger to Self  Current suicidal ideation? Denies  Danger to Others  Danger to Others None reported or observed

## 2023-09-03 NOTE — Progress Notes (Signed)
 Patient ID: Tina [name], female   DOB: 03/16/1980, 43 y.o.   MRN: [id number] Discharge instructions, medications and follow up appointments reviewed, pt verbalized understanding. Belongings returned to patient to her satisfaction, pt dc home.

## 2023-09-03 NOTE — Plan of Care (Signed)
  Problem: Education: Goal: Knowledge of [facility] Education information/materials will improve 09/03/2023 0936 by Deidre Sartorius, RN Outcome: Completed/Met 09/03/2023 0936 by Deidre Sartorius, RN Outcome: Adequate for Discharge 09/03/2023 0935 by Deidre Sartorius, RN Outcome: Progressing Goal: Emotional status will improve 09/03/2023 0936 by Deidre Sartorius, RN Outcome: Completed/Met 09/03/2023 0936 by Deidre Sartorius, RN Outcome: Adequate for Discharge Goal: Mental status will improve 09/03/2023 0936 by Deidre Sartorius, RN Outcome: Completed/Met 09/03/2023 0936 by Deidre Sartorius, RN Outcome: Adequate for Discharge 09/03/2023 0935 by Deidre Sartorius, RN Outcome: Progressing Goal: Verbalization of understanding the information provided will improve 09/03/2023 0936 by Deidre Sartorius, RN Outcome: Completed/Met 09/03/2023 0936 by Deidre Sartorius, RN Outcome: Adequate for Discharge 09/03/2023 0935 by Deidre Sartorius, RN Outcome: Progressing   Problem: Activity: Goal: Interest or engagement in activities will improve 09/03/2023 0936 by Deidre Sartorius, RN Outcome: Completed/Met 09/03/2023 0936 by Deidre Sartorius, RN Outcome: Adequate for Discharge Goal: Sleeping patterns will improve 09/03/2023 0936 by Deidre Sartorius, RN Outcome: Completed/Met 09/03/2023 0936 by Deidre Sartorius, RN Outcome: Adequate for Discharge 09/03/2023 0935 by Deidre Sartorius, RN Outcome: Progressing   Problem: Coping: Goal: Ability to verbalize frustrations and anger appropriately will improve 09/03/2023 0936 by Deidre Sartorius, RN Outcome: Completed/Met 09/03/2023 0936 by Deidre Sartorius, RN Outcome: Adequate for Discharge 09/03/2023 0935 by Deidre Sartorius, RN Outcome: Progressing Goal: Ability to demonstrate self-control will improve 09/03/2023 0936 by Deidre Sartorius, RN Outcome: Completed/Met 09/03/2023 0936 by Deidre Sartorius, RN Outcome: Adequate for Discharge   Problem: Health Behavior/Discharge Planning: Goal:  Identification of resources available to assist in meeting health care needs will improve 09/03/2023 0936 by Deidre Sartorius, RN Outcome: Completed/Met 09/03/2023 0936 by Deidre Sartorius, RN Outcome: Adequate for Discharge Goal: Compliance with treatment plan for underlying cause of condition will improve 09/03/2023 0936 by Deidre Sartorius, RN Outcome: Completed/Met 09/03/2023 0936 by Deidre Sartorius, RN Outcome: Adequate for Discharge

## 2023-09-03 NOTE — Progress Notes (Signed)
   09/03/23 0730  Psych Admission Type (Psych Patients Only)  Admission Status Involuntary  Psychosocial Assessment  Patient Complaints Anxiety  Eye Contact Fair  Facial Expression Animated  Affect Appropriate to circumstance  Speech Logical/coherent  Interaction Assertive  Motor Activity Other (Comment) (WNL for pt)  Appearance/Hygiene Unremarkable  Behavior Characteristics Cooperative  Mood Pleasant  Thought Process  Coherency WDL  Content WDL  Delusions None reported or observed  Perception WDL  Hallucination None reported or observed  Judgment WDL  Confusion None  Danger to Self  Current suicidal ideation? Denies  Agreement Not to Harm Self Yes  Description of Agreement verbal  Danger to Others  Danger to Others None reported or observed

## 2023-09-03 NOTE — Progress Notes (Signed)
(  Sleep Hours) -7.25 (Any PRNs that were needed, meds refused, or side effects to meds)- Atarax , and Melatonin  (Any disturbances and when (visitation, over night)-none (Concerns raised by the patient)- Pt has trouble with falling aslep (SI/HI/AVH)-Denied

## 2023-09-03 NOTE — Discharge Summary (Addendum)
 Physician Discharge Summary Note  Patient:  Tina [name] is an 43 y.o., female MRN:  [id number] DOB:  1980/04/14 Patient phone:  [phone] (home)  Patient address:   [address] [place] [address],    Date of Admission:  09/01/2023 Date of Discharge: 09/03/2023  Time spent on discharge activities: A total of 45 minutes was spent on discharge planning, care coordination, documentation, etc.   Reason for Admission:  The patient is a 43 y/o female with a history of depression, anxiety, recently homelessness, and intermittent methamphetamine use who was admitted involuntarily for reported suicidal ideation. She initially presented to [facility] under police escort on 8/15 after being found at her brother's house, the petitioner, and was described initially as irritable and argumentative.   Principal Problem: Recurrent major depression in partial remission [facility]) Discharge Diagnoses: Principal Problem:   Recurrent major depression in partial remission (HCC) Active Problems:   Generalized anxiety disorder   Methamphetamine use disorder, moderate (HCC)   Past Psychiatric History: Per patient, she has prior diagnoses of PTSD, depression, anxiety, and ADHD. She reports a history of multiple medication trials but only recalls taking fluoxetine  (ineffective) and hydroxyzine  (effective) specifically. She denies any history of suicidal behaviors. She denies any prior psychiatric admissions or SUD treatment.    Hospital Course:  The patient was admitted to inpatient psychiatry involuntarily for expressed suicidal ideation. She was initially taken to the [facility] on 8/15 under police escort after her brother petitioned her for danger to self. She spent two [facility] at the [facility] prior to admission to [facility] on 8/18. On initial evaluation on 8/19 she was calm and collected and reported that she felt much improved since admission, largely on account of having slept well and  being started on medications for depression and anxiety. She was started on sertraline  25 mg daily and buspirone  7.5 mg BID, along with olanzapine  5 mg BID for acute agitation. She tolerated these well and reported a significant reduction in anxiety and agitation by HD 2. During initial intake she clarified that she had not actually experienced any suicidal ideation and rather made these comments out of anger and frustration. This was consistent with a prior ED encounter she had on 8/8 under similar circumstances. Sertraline  was increased  to 50 mg daily and buspirone  to 10 mg BID and she tolerated these well without side effects. Olanzapine  was discontinued due to lack of indication. The patient met with SW and had a confirmed, previously scheduled, appointment for medication management [facility] on [facility]/22, and counseling on 9/30. She reported that after discharge her plan was to meet up with her partner and stay in a hotel for one or more nights prior to staying with her partner's sister for an indefinite period of time, though she still planned on eventually leaving the area. On the day of discharge she reported a euthymic mood, denied suicidal ideation, and understood and agreed with the discharge plan. During the admission she did not exhibit any unsafe behaviors and participated well in the milieu.    Musculoskeletal: Normal gait and station   Mental Status Exam: Appearance - Dressed in hospital scrubs, NAD Attitude - Friendly, polite Speech - normal volume, prosody, inflection Mood - Good Affect - Full Thought Process - LLGD Thought Content - No delusional TC expressed SI/HI - Denies  Perceptions - Denies; not RIS Judgement/Insight - Fair to good Fund of knowledge - WNL Language - No impairments  Physical Exam Vitals reviewed.  HENT:     Head: Normocephalic and atraumatic.  Pulmonary:     Effort: Pulmonary effort is normal.  Musculoskeletal:         General: Normal range of motion.    ROS Blood pressure 121/82, pulse 77, temperature 98.3 F (36.8 C), temperature source Oral, resp. rate 16, height 5' 7 (1.702 m), weight 61.7 kg, SpO2 100%. Body mass index is 21.3 kg/m.   Social History   Tobacco Use  Smoking Status Former   Current packs/day: 0.00   Average packs/day: 0.5 packs/day for 20.0 years (10.0 ttl pk-yrs)   Types: Cigarettes   Start date: 01/14/1996   Quit date: 01/14/2016   Years since quitting: 7.6  Smokeless Tobacco Never   Tobacco Cessation:  N/A, patient does not currently use tobacco products   Blood Alcohol level:  Lab Results  Component Value Date   [facility] <15 08/28/2023   ETH <15 05/20/2023    Metabolic Disorder Labs:  Lab Results  Component Value Date   HGBA1C 5.7 (H) 12/20/2022   MPG 116.89 12/20/2022   No results found for: PROLACTIN Lab Results  Component Value Date   CHOL 191 06/23/2012   TRIG 45 07/28/2022   HDL 40.90 06/23/2012   CHOLHDL 5 06/23/2012   VLDL 32.4 06/23/2012   LDLCALC 118 (H) 06/23/2012    See Psychiatric Specialty Exam and Suicide Risk Assessment completed by Attending Physician prior to discharge.  Discharge destination:  Home  Is patient on multiple antipsychotic therapies at discharge:  No     Discharge Instructions     Diet general   Complete by: As directed    Increase activity slowly   Complete by: As directed       Allergies as of 09/03/2023       Reactions   Nsaids Other (See Comments)   Perforated ulcers   Oxycodone  Itching, Other (See Comments)   SEVERE itching with Percocet   Tramadol Other (See Comments), Rash   Triggers migraines also tramadol   Wellbutrin  [bupropion ] Hives        Medication List     STOP taking these medications    ferrous sulfate  325 (65 FE) MG tablet   pantoprazole  40 MG tablet Commonly known as: PROTONIX    sucralfate  1 GM/10ML suspension Commonly known as: Carafate        TAKE these medications       Indication  busPIRone  10 MG tablet Commonly known as: BUSPAR  Take 1 tablet (10 mg total) by mouth 2 (two) times daily.  Indication: Anxiety Disorder   gabapentin  600 MG tablet Commonly known as: Neurontin  Take 1 tablet (600 mg total) by mouth 2 (two) times daily.  Indication: Neuropathic Pain   sertraline  50 MG tablet Commonly known as: ZOLOFT  Take 1 tablet (50 mg total) by mouth daily. Start taking on: September 04, 2023  Indication: Major Depressive Disorder, Posttraumatic Stress Disorder        Follow-up Information     [facility] Follow up on 10/05/2023.   Specialty: Behavioral Health Why: You have an appointment for medication management services on 10/05/23 at 1:00 pm.  You also have an appointment for therapy services on 10/13/23 at 1:00 pm. Contact information: [address] [place]  [address] [phone]                Follow-up recommendations:  Attend all follow-up appointments as scheduled, take all medications as prescribed, abstain from substance use, avoid  interactions with problematic family members for the next couple weeks.     Signed: Oliva DELENA Salmon, DO 09/03/2023, 10:50 AM

## 2023-09-16 ENCOUNTER — Telehealth: Payer: Self-pay | Admitting: *Deleted

## 2023-09-16 NOTE — Telephone Encounter (Signed)
 Will forward to Dr. Merceda Nap team. Copied from CRM [id number]. Topic: Clinical - Lab/Test Results >> Sep 16, 2023 11:10 AM Marda MATSU wrote: Pt Tina [name] would like a call back to go over her imaging results dated: August 9th of her right and left hand.

## 2023-09-21 NOTE — Telephone Encounter (Signed)
 Call to patient message was left that the Clinics had called.  To return call if needed.

## 2023-10-03 NOTE — Progress Notes (Unsigned)
 Psychiatric Initial Adult Assessment  Patient Identification: Tina [name] MRN:  [id number] Date of Evaluation:  10/03/2023 Referral Source: ***  Assessment:  Tina [name] is a 43 y.o. female with a history of *** iron  deficiency anemia who presents to [facility] via video conferencing for initial evaluation of ***.  Patient reports ***  Plan:  # *** Past medication trials:  Status of problem: *** Interventions: -- *** -- R/o contributing medical conditions:  -- CBC revealing for microcytic anemia 08/28/23 ***  -- TSH wnl 05/20/23 -- Renal and hepatic function wnl 08/28/23 -- Scheduled for therapy intake with Bernice R[facility] 10/13/23  # Methamphetamine use disorder   # Cannabis use disorder Past medication trials:  Status of problem: *** Interventions: -- ***  # *** Past medication trials:  Status of problem: *** Interventions: -- ***  Patient was given contact information for behavioral health clinic and was instructed to call 911 for emergencies.   Subjective:  Chief Complaint: No chief complaint on file.   History of Present Illness:  ***  Chart review: -- Referred by PCP for severe MDD -- S/p [facility] admission 09/01/23-09/03/23 for SI (later patient recanted stating these statements were made out of anger and frustration). UDS positive for THC. Diagnoses felt c/w MDD, GAD, methamphetamine use disorder. Discharged on Zoloft  50 mg daily, Buspar  10 mg BID, gabapentin  600 mg BID. Denied any history of mania outside of illicit stimulant use.  -- Home psychotropics: Zoloft  50 mg daily Buspar  10 mg BID Gabapentin  600 mg BID   Living - with partner in hotel? Sister? Meth/cocaine use, other substances - opioids, etoh iron    Past Psychiatric History:  Diagnoses: ***MDD, GAD, PTSD, methamphetamine use disorder Medication trials: ***Prozac , Atarax  Previous psychiatrist/therapist: *** Hospitalizations: *** Suicide attempts:  *** SIB: *** Hx of violence towards others: *** Current access to guns: *** Hx of trauma/abuse: ***  Previous Psychotropic Medications: {YES/NO:21197}  Substance Abuse History in the last 12 months:  {yes no:314532}  -- Methamphetamine:  -- Cocaine:  -- Cannabis:   Past Medical History:  Past Medical History:  Diagnosis Date   Alcohol use disorder, severe, in early remission (HCC) 12/21/2022   Allergic rhinitis    Anxiety    Arthritis    In her back and legs per pt, 09/03/2020   Bipolar disorder (HCC)    Diagnosised 10 yrs ago doesn't take any meds 09/03/2020   Cannabis use disorder 12/21/2022   Chronic thoracic back pain    GERD (gastroesophageal reflux disease)    H/O Gastrojejunal ulcer with recurrent perforation 07/28/2022   History of gestational diabetes    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   Hyperlipidemia    Hypocalcemia 12/21/2022   Iron  deficiency anemia due to chronic blood loss 11/18/2016   Major depression    MDD (major depressive disorder), recurrent episode (HCC) 03/16/2017   Menorrhagia    Microcytic anemia 12/21/2022   Migraine    Protein-calorie malnutrition, severe 07/29/2022   Pruritus ani 04/26/2009   Qualifier: Diagnosis of   By: Norleen MD, [place] ORN        Smokers' cough (HCC)    albuterol  (PROVENTIL  HFA;VENTOLIN  HFA)   Splenic rupture 05/26/2022   Thyroid  nodule 06/06/2016   Wears glasses    09/03/2020    Past Surgical History:  Procedure Laterality Date   BIOPSY THYROID   06/06/2016   LEFT THYROIDECTOMY   CESAREAN SECTION  05/21/2004   CESAREAN SECTION WITH BILATERAL TUBAL LIGATION Bilateral 08/29/2014  Procedure: CESAREAN SECTION WITH BILATERAL TUBAL LIGATION;  Surgeon: Krystal Deaner, MD;  Location: [facility];  Service: Obstetrics;  Laterality: Bilateral;  MD requests RNFA [name] confirmed 7/29...tms   CHOLECYSTECTOMY N/A 03/26/2015   Procedure: LAPAROSCOPIC CHOLECYSTECTOMY;  Surgeon: Herlene Righter Kinsinger, MD;  Location:  [facility];  Service: General;  Laterality: N/A;   DILATATION & CURETTAGE/HYSTEROSCOPY WITH MYOSURE N/A 09/05/2020   Procedure: DILATATION & CURETTAGE/HYSTEROSCOPY WITH MYOSURE;  Surgeon: Deaner Krystal, MD;  Location: [facility] [place];  Service: Gynecology;  Laterality: N/A;   DILITATION & CURRETTAGE/HYSTROSCOPY WITH NOVASURE ABLATION N/A 01/28/2016   Procedure: DILATATION & CURETTAGE/HYSTEROSCOPY WITH NOVASURE ABLATION;  Surgeon: Krystal Deaner, MD;  Location: [facility];  Service: Gynecology;  Laterality: N/A;   GASTRIC BYPASS     done in 2021, 09/03/2020   LAPAROTOMY N/A 11/26/2021   Procedure: EXPLORATORY LAPAROTOMY, GRAHAM PATCH REPAIR OF PERFORATED ULCER;  Surgeon: Belinda Cough, MD;  Location: [facility];  Service: General;  Laterality: N/A;   LAPAROTOMY N/A 07/27/2022   Procedure: EXPLORATORY LAPAROTOMY WITH CLOSURE OF GASTROJEJUNOSTOMY MARGIN ULCER;  Surgeon: Vanderbilt Ned, MD;  Location: [facility];  Service: General;  Laterality: N/A;   LUMBAR MICRODISCECTOMY  11/17/2007   L5-  S1   THYROIDECTOMY Left 06/06/2016   Procedure: LEFT THYROIDECTOMY AND CORE BIOPSY OF RIGHT THYROID ;  Surgeon: Carlie Clark, MD;  Location[facility];  Service: ENT;  Laterality: Left;   TONSILLECTOMY  2001   TUBAL LIGATION      Family Psychiatric History: ***  Family History:  Family History  Problem Relation Age of Onset   Depression Mother    Mental illness Mother    Diabetes Father    Hypertension Father    Hyperlipidemia Father    Diabetes Maternal Grandmother        Dialysis   Hypertension Maternal Grandmother    Heart disease Maternal Grandmother    Cancer Paternal Grandmother        Breast Cancer   Cancer Paternal Grandfather        colon   Colon cancer Paternal Grandfather    Diabetes Brother    Cancer Other        colon   Kidney failure Other        dialysis   Colon cancer Paternal Uncle    Esophageal cancer Neg Hx    Stomach cancer Neg Hx    Rectal cancer Neg Hx      Social History:   Academic/Vocational: ***  Social History   Socioeconomic History   Marital status: Single    Spouse name: Not on file   Number of children: 1   Years of education: Not on file   Highest education level: Not on file  Occupational History   Occupation: Agricultural engineer: [employer]   Occupation: part time Community education officer  Tobacco Use   Smoking status: Former    Current packs/day: 0.00    Average packs/day: 0.5 packs/day for 20.0 years (10.0 ttl pk-yrs)    Types: Cigarettes    Start date: 01/14/1996    Quit date: 01/14/2016    Years since quitting: 7.7   Smokeless tobacco: Never  Vaping Use   Vaping status: Never Used  Substance and Sexual Activity   Alcohol use: No   Drug use: No   Sexual activity: Not on file  Other Topics Concern   Not on file  Social History Narrative   Also raises 6 yo nephew   Social Drivers  of Health   Financial Resource Strain: Low Risk  (07/02/2023)   Overall Financial Resource Strain (CARDIA)    Difficulty of Paying Living Expenses: Not very hard  Food Insecurity: Food Insecurity Present (09/01/2023)   Hunger Vital Sign    Worried About Running Out of Food in the Last Year: Often true    Ran Out of Food in the Last Year: Often true  Transportation Needs: Unmet Transportation Needs (05/21/2023)   PRAPARE - Administrator, Civil Service (Medical): Yes    Lack of Transportation (Non-Medical): Yes  Physical Activity: Insufficiently Active (07/02/2023)   Exercise Vital Sign    Days of Exercise per Week: 2 days    Minutes of Exercise per Session: 60 min  Stress: Stress Concern Present (07/02/2023)   [employer] of Occupational Health - Occupational Stress Questionnaire    Feeling of Stress: Very much  Social Connections: Socially Isolated (05/21/2023)   Social Connection and Isolation Panel    Frequency of Communication with Friends and Family: Never    Frequency of Social Gatherings with Friends and Family:  Never    Attends Religious Services: Never    Database administrator or Organizations: No    Attends Banker Meetings: Never    Marital Status: Divorced    Additional Social History: updated  Allergies:   Allergies  Allergen Reactions   Nsaids Other (See Comments)    Perforated ulcers   Oxycodone  Itching and Other (See Comments)    SEVERE itching with Percocet    Tramadol Other (See Comments) and Rash    Triggers migraines also  tramadol   Wellbutrin  [Bupropion ] Hives    Current Medications: Current Outpatient Medications  Medication Sig Dispense Refill   busPIRone  (BUSPAR ) 10 MG tablet Take 1 tablet (10 mg total) by mouth 2 (two) times daily. 60 tablet 0   gabapentin  (NEURONTIN ) 600 MG tablet Take 1 tablet (600 mg total) by mouth 2 (two) times daily. 60 tablet 0   sertraline  (ZOLOFT ) 50 MG tablet Take 1 tablet (50 mg total) by mouth daily. 30 tablet 0   No current facility-administered medications for this visit.    ROS: Review of Systems  Objective:  Psychiatric Specialty Exam: There were no vitals taken for this visit.There is no height or weight on file to calculate BMI.  General Appearance: {Appearance:22683}  Eye Contact:  {BHH EYE CONTACT:22684}  Speech:  {Speech:22685}  Volume:  {Volume (PAA):22686}  Mood:  {BHH MOOD:22306}  Affect:  {Affect (PAA):22687}  Thought Content: {Thought Content:22690}   Suicidal Thoughts:  {ST/HT (PAA):22692}  Homicidal Thoughts:  {ST/HT (PAA):22692}  Thought Process:  {Thought Process (PAA):22688}  Orientation:  {BHH ORIENTATION (PAA):22689}    Memory: Grossly intact ***  Judgment:  {Judgement (PAA):22694}  Insight:  {Insight (PAA):22695}  Concentration:  {Concentration:21399}  Recall:  not formally assessed ***  Fund of Knowledge: {BHH GOOD/FAIR/POOR:22877}  Language: {BHH GOOD/FAIR/POOR:22877}  Psychomotor Activity:  {Psychomotor (PAA):22696}  Akathisia:  {BHH YES OR NO:22294}  AIMS (if indicated):  {Desc; done/not:10129}  Assets:  {Assets (PAA):22698}  ADL's:  {BHH JIO'D:77709}  Cognition: {chl bhh cognition:304700322}  Sleep:  {BHH GOOD/FAIR/POOR:22877}   PE: General: sits comfortably in view of camera; no acute distress *** Pulm: no increased work of breathing on room air *** MSK: all extremity movements appear intact *** Neuro: no focal neurological deficits observed *** Gait & Station: unable to assess by video ***   Metabolic Disorder Labs: Lab Results  Component Value Date  HGBA1C 5.7 (H) 12/20/2022   MPG 116.89 12/20/2022   No results found for: PROLACTIN Lab Results  Component Value Date   CHOL 191 06/23/2012   TRIG 45 07/28/2022   HDL 40.90 06/23/2012   CHOLHDL 5 06/23/2012   VLDL 32.4 06/23/2012   LDLCALC 118 (H) 06/23/2012   Lab Results  Component Value Date   TSH 0.378 05/20/2023    Therapeutic Level Labs: No results found for: LITHIUM No results found for: CBMZ No results found for: VALPROATE  Screenings:  GAD-7    Flowsheet Row Office Visit from 08/11/2023 in [facility] - A Dept Of [place]. [facility] Office Visit from 07/02/2023 in [facility] - A Dept Of [place]. [facility]  Total GAD-7 Score 21 15   PHQ2-9    Flowsheet Row Office Visit from 08/11/2023 in [facility] - A Dept Of [place]. [facility] Most recent reading at 08/11/2023  8:49 AM Office Visit from 07/02/2023 in [facility] - A Dept Of [place]. [facility] Most recent reading at 07/02/2023  3:45 PM ED from 12/20/2022 in [facility] Most recent reading at 12/23/2022  8:15 AM ED from 12/20/2022 in [facility] Most recent reading at 12/20/2022  3:07 PM Nutrition from 07/05/2014 in [facility]  - A Dept Of [place]. [facility] Most recent reading at 07/06/2014 11:06 AM  PHQ-2 Total Score 4 3 2  2 1   PHQ-9 Total Score 16 9 8 8  --   Flowsheet Row [facility]) [facility]28/2023 [facility] ED from 08/15/2023 in [facility] Emergency Department at [facility]  C-SSRS RISK CATEGORY No Risk No Risk No Risk    Collaboration of Care: Collaboration of Care: [facility] OP Collaboration of Rjmz:[id number]}  Patient/Guardian was advised Release of Information must be obtained prior to any record release in order to collaborate their care with an outside provider. Patient/Guardian was advised if they have not already done so to contact the registration department to sign all necessary forms in order for us  to release information regarding their care.   Consent: Patient/Guardian gives verbal consent for treatment and assignment of benefits for services provided during this visit. Patient/Guardian expressed understanding and agreed to proceed.   Televisit via video: I connected with Demiah Diane Kielbasa on 10/03/23 at  1:00 PM EDT by a video enabled telemedicine application and verified that I am speaking with the correct person using two identifiers.  Location: Patient: *** Provider: remote office in [place]   I discussed the limitations of evaluation and management by telemedicine and the availability of in person appointments. The patient expressed understanding and agreed to proceed.  I discussed the assessment and treatment plan with the patient. The patient was provided an opportunity to ask questions and all were answered. The patient agreed with the plan and demonstrated an understanding of the instructions.   The patient was advised to call back or seek an in-person evaluation if the symptoms worsen or if the condition fails to improve as anticipated.  I provided *** minutes dedicated to the care of this patient via video on the date of this encounter to include chart review, face-to-face  time with the patient, medication management/counseling ***.  Lennin Osmond A Blessings Inglett 9/20/20259:06 AM

## 2023-10-05 ENCOUNTER — Encounter (HOSPITAL_COMMUNITY): Payer: Self-pay

## 2023-10-05 ENCOUNTER — Encounter (HOSPITAL_COMMUNITY): Payer: MEDICAID | Admitting: Psychiatry

## 2023-10-13 ENCOUNTER — Ambulatory Visit (HOSPITAL_COMMUNITY): Payer: MEDICAID | Admitting: Mental Health

## 2023-11-05 NOTE — Addendum Note (Signed)
 Addended by: ELICIA SHARPER on: 11/05/2023 05:34 PM   Modules accepted: Orders

## 2023-12-03 NOTE — Progress Notes (Deleted)
   GYNECOLOGY OFFICE VISIT NOTE  History:  Tina [name] is a 43 y.o. [id number] here today for AUB.   ***  H/o ablation in 2018. She had subsequent D&C, myosure in 2022. She also has a history of 2 prior c-sections.  No recent pap  She had a CT in 05/2023 which was normal from a reproductive organ perspective.    The following portions of the patient's history were reviewed and updated as appropriate: allergies, current medications, past family history, past medical history, past social history, past surgical history and problem list.   Review of Systems:  Pertinent items noted in HPI and remainder of comprehensive ROS otherwise negative.  Physical Exam:  There were no vitals taken for this visit. CONSTITUTIONAL: Well-developed, well-nourished female in no acute distress.  HEENT:  Normocephalic, atraumatic. External right and left ear normal. No scleral icterus.  NECK: Normal range of motion, supple, no masses noted on observation SKIN: No rash noted. Not diaphoretic. No erythema. No pallor. MUSCULOSKELETAL: Normal range of motion. No edema noted. NEUROLOGIC: Alert and oriented to person, place, and time. Normal muscle tone coordination. No cranial nerve deficit noted. PSYCHIATRIC: Normal mood and affect. Normal behavior. Normal judgment and thought content.  PELVIC: {Blank single:19197::Deferred,Normal appearing external genitalia; normal urethral meatus; normal appearing vaginal mucosa and cervix.  No abnormal discharge noted.  Normal uterine size, no other palpable masses, no uterine or adnexal tenderness. Performed in the presence of a chaperone}  Labs and Imaging No results found for this or any previous visit (from the past week). No results found.  Assessment and Plan:  1. Abnormal uterine bleeding (AUB) (Primary) ***    Diagnoses and all orders for this visit:  Abnormal uterine bleeding (AUB)     No orders of the defined types were placed in this  encounter.    Routine preventative health maintenance measures emphasized. Please refer to After Visit Summary for other counseling recommendations.   No follow-ups on file.  Vina Solian, MD, FACOG Obstetrician & Gynecologist, [facility] for [facility], [facility]

## 2023-12-16 ENCOUNTER — Encounter: Payer: MEDICAID | Admitting: Obstetrics and Gynecology

## 2023-12-16 DIAGNOSIS — N939 Abnormal uterine and vaginal bleeding, unspecified: Secondary | ICD-10-CM

## 2023-12-17 ENCOUNTER — Encounter: Payer: MEDICAID | Admitting: Obstetrics and Gynecology
